# Patient Record
Sex: Male | Born: 1940
Health system: Southern US, Community
[De-identification: ages and names within clinical notes are randomized; demographics above are authoritative.]

## PROBLEM LIST (undated history)

## (undated) DIAGNOSIS — K219 Gastro-esophageal reflux disease without esophagitis: Secondary | ICD-10-CM

## (undated) DIAGNOSIS — J45909 Unspecified asthma, uncomplicated: Secondary | ICD-10-CM

## (undated) DIAGNOSIS — E785 Hyperlipidemia, unspecified: Secondary | ICD-10-CM

## (undated) DIAGNOSIS — M720 Palmar fascial fibromatosis [Dupuytren]: Secondary | ICD-10-CM

## (undated) DIAGNOSIS — K627 Radiation proctitis: Secondary | ICD-10-CM

## (undated) DIAGNOSIS — J9601 Acute respiratory failure with hypoxia: Secondary | ICD-10-CM

## (undated) DIAGNOSIS — N289 Disorder of kidney and ureter, unspecified: Secondary | ICD-10-CM

## (undated) DIAGNOSIS — M1990 Unspecified osteoarthritis, unspecified site: Secondary | ICD-10-CM

## (undated) DIAGNOSIS — I10 Essential (primary) hypertension: Secondary | ICD-10-CM

## (undated) HISTORY — DX: Hyperlipidemia, unspecified: E78.5

## (undated) HISTORY — DX: Essential (primary) hypertension: I10

## (undated) HISTORY — DX: Unspecified osteoarthritis, unspecified site: M19.90

## (undated) HISTORY — PX: PROSTATE SURGERY: SHX751

## (undated) HISTORY — DX: Palmar fascial fibromatosis (dupuytren): M72.0

## (undated) HISTORY — DX: Radiation proctitis: K62.7

## (undated) HISTORY — DX: Gastro-esophageal reflux disease without esophagitis: K21.9

---

## 2011-04-22 DIAGNOSIS — R972 Elevated prostate specific antigen [PSA]: Secondary | ICD-10-CM | POA: Diagnosis not present

## 2011-05-07 DIAGNOSIS — C61 Malignant neoplasm of prostate: Secondary | ICD-10-CM | POA: Diagnosis not present

## 2011-05-07 DIAGNOSIS — R972 Elevated prostate specific antigen [PSA]: Secondary | ICD-10-CM | POA: Diagnosis not present

## 2011-05-13 DIAGNOSIS — C61 Malignant neoplasm of prostate: Secondary | ICD-10-CM | POA: Diagnosis not present

## 2011-05-19 ENCOUNTER — Ambulatory Visit: Payer: Self-pay | Admitting: Urology

## 2011-05-19 DIAGNOSIS — R937 Abnormal findings on diagnostic imaging of other parts of musculoskeletal system: Secondary | ICD-10-CM | POA: Diagnosis not present

## 2011-05-19 DIAGNOSIS — C61 Malignant neoplasm of prostate: Secondary | ICD-10-CM | POA: Diagnosis not present

## 2011-05-28 ENCOUNTER — Ambulatory Visit: Payer: Self-pay | Admitting: Radiation Oncology

## 2011-06-10 ENCOUNTER — Ambulatory Visit: Payer: Self-pay | Admitting: Radiation Oncology

## 2011-06-10 DIAGNOSIS — Z79899 Other long term (current) drug therapy: Secondary | ICD-10-CM | POA: Diagnosis not present

## 2011-06-10 DIAGNOSIS — Z79818 Long term (current) use of other agents affecting estrogen receptors and estrogen levels: Secondary | ICD-10-CM | POA: Diagnosis not present

## 2011-06-10 DIAGNOSIS — R351 Nocturia: Secondary | ICD-10-CM | POA: Diagnosis not present

## 2011-06-10 DIAGNOSIS — Z510 Encounter for antineoplastic radiation therapy: Secondary | ICD-10-CM | POA: Diagnosis not present

## 2011-06-10 DIAGNOSIS — C61 Malignant neoplasm of prostate: Secondary | ICD-10-CM | POA: Diagnosis not present

## 2011-06-10 DIAGNOSIS — I10 Essential (primary) hypertension: Secondary | ICD-10-CM | POA: Diagnosis not present

## 2011-06-10 DIAGNOSIS — Z7982 Long term (current) use of aspirin: Secondary | ICD-10-CM | POA: Diagnosis not present

## 2011-06-10 DIAGNOSIS — N400 Enlarged prostate without lower urinary tract symptoms: Secondary | ICD-10-CM | POA: Diagnosis not present

## 2011-06-10 DIAGNOSIS — E119 Type 2 diabetes mellitus without complications: Secondary | ICD-10-CM | POA: Diagnosis not present

## 2011-06-10 DIAGNOSIS — K219 Gastro-esophageal reflux disease without esophagitis: Secondary | ICD-10-CM | POA: Diagnosis not present

## 2011-06-10 DIAGNOSIS — E785 Hyperlipidemia, unspecified: Secondary | ICD-10-CM | POA: Diagnosis not present

## 2011-06-14 DIAGNOSIS — C61 Malignant neoplasm of prostate: Secondary | ICD-10-CM | POA: Diagnosis not present

## 2011-06-16 DIAGNOSIS — C61 Malignant neoplasm of prostate: Secondary | ICD-10-CM | POA: Diagnosis not present

## 2011-06-18 ENCOUNTER — Ambulatory Visit: Payer: Self-pay | Admitting: Radiation Oncology

## 2011-06-18 DIAGNOSIS — C61 Malignant neoplasm of prostate: Secondary | ICD-10-CM | POA: Diagnosis not present

## 2011-06-18 DIAGNOSIS — Z79818 Long term (current) use of other agents affecting estrogen receptors and estrogen levels: Secondary | ICD-10-CM | POA: Diagnosis not present

## 2011-06-18 DIAGNOSIS — Z510 Encounter for antineoplastic radiation therapy: Secondary | ICD-10-CM | POA: Diagnosis not present

## 2011-06-21 DIAGNOSIS — C61 Malignant neoplasm of prostate: Secondary | ICD-10-CM | POA: Diagnosis not present

## 2011-06-22 DIAGNOSIS — C61 Malignant neoplasm of prostate: Secondary | ICD-10-CM | POA: Diagnosis not present

## 2011-06-23 DIAGNOSIS — C61 Malignant neoplasm of prostate: Secondary | ICD-10-CM | POA: Diagnosis not present

## 2011-06-24 DIAGNOSIS — C61 Malignant neoplasm of prostate: Secondary | ICD-10-CM | POA: Diagnosis not present

## 2011-06-24 LAB — CBC CANCER CENTER
Eosinophil #: 0.2 x10 3/mm (ref 0.0–0.7)
Eosinophil %: 2.6 %
HCT: 41.8 % (ref 40.0–52.0)
Lymphocyte %: 23.2 %
MCHC: 34.5 g/dL (ref 32.0–36.0)
Monocyte %: 6.5 %
Neutrophil #: 4.8 x10 3/mm (ref 1.4–6.5)
Neutrophil %: 67 %
RDW: 13.3 % (ref 11.5–14.5)
WBC: 7.1 x10 3/mm (ref 3.8–10.6)

## 2011-06-25 DIAGNOSIS — C61 Malignant neoplasm of prostate: Secondary | ICD-10-CM | POA: Diagnosis not present

## 2011-06-28 DIAGNOSIS — C61 Malignant neoplasm of prostate: Secondary | ICD-10-CM | POA: Diagnosis not present

## 2011-06-29 DIAGNOSIS — C61 Malignant neoplasm of prostate: Secondary | ICD-10-CM | POA: Diagnosis not present

## 2011-06-30 DIAGNOSIS — C61 Malignant neoplasm of prostate: Secondary | ICD-10-CM | POA: Diagnosis not present

## 2011-07-01 DIAGNOSIS — C61 Malignant neoplasm of prostate: Secondary | ICD-10-CM | POA: Diagnosis not present

## 2011-07-01 LAB — CBC CANCER CENTER
Basophil #: 0 x10 3/mm (ref 0.0–0.1)
Eosinophil %: 2 %
Lymphocyte #: 0.9 x10 3/mm — ABNORMAL LOW (ref 1.0–3.6)
Lymphocyte %: 15.7 %
MCHC: 34.5 g/dL (ref 32.0–36.0)
MCV: 91 fL (ref 80–100)
Monocyte #: 0.4 x10 3/mm (ref 0.0–0.7)
Monocyte %: 6.5 %
Neutrophil %: 75 %
Platelet: 209 x10 3/mm (ref 150–440)
RDW: 13.1 % (ref 11.5–14.5)
WBC: 6 x10 3/mm (ref 3.8–10.6)

## 2011-07-12 DIAGNOSIS — C61 Malignant neoplasm of prostate: Secondary | ICD-10-CM | POA: Diagnosis not present

## 2011-07-13 DIAGNOSIS — C61 Malignant neoplasm of prostate: Secondary | ICD-10-CM | POA: Diagnosis not present

## 2011-07-14 DIAGNOSIS — C61 Malignant neoplasm of prostate: Secondary | ICD-10-CM | POA: Diagnosis not present

## 2011-07-15 DIAGNOSIS — C61 Malignant neoplasm of prostate: Secondary | ICD-10-CM | POA: Diagnosis not present

## 2011-07-15 LAB — CBC CANCER CENTER
Basophil %: 0.5 %
Eosinophil #: 0.3 x10 3/mm (ref 0.0–0.7)
Eosinophil %: 4.5 %
HCT: 37.2 % — ABNORMAL LOW (ref 40.0–52.0)
HGB: 12.7 g/dL — ABNORMAL LOW (ref 13.0–18.0)
MCHC: 34.3 g/dL (ref 32.0–36.0)
MCV: 91 fL (ref 80–100)
Platelet: 245 x10 3/mm (ref 150–440)
RDW: 12.9 % (ref 11.5–14.5)
WBC: 5.9 x10 3/mm (ref 3.8–10.6)

## 2011-07-19 ENCOUNTER — Ambulatory Visit: Payer: Self-pay | Admitting: Radiation Oncology

## 2011-07-19 DIAGNOSIS — C61 Malignant neoplasm of prostate: Secondary | ICD-10-CM | POA: Diagnosis not present

## 2011-07-19 DIAGNOSIS — Z510 Encounter for antineoplastic radiation therapy: Secondary | ICD-10-CM | POA: Diagnosis not present

## 2011-07-19 DIAGNOSIS — Z79818 Long term (current) use of other agents affecting estrogen receptors and estrogen levels: Secondary | ICD-10-CM | POA: Diagnosis not present

## 2011-07-20 DIAGNOSIS — C61 Malignant neoplasm of prostate: Secondary | ICD-10-CM | POA: Diagnosis not present

## 2011-07-20 DIAGNOSIS — E119 Type 2 diabetes mellitus without complications: Secondary | ICD-10-CM | POA: Diagnosis not present

## 2011-07-20 DIAGNOSIS — I10 Essential (primary) hypertension: Secondary | ICD-10-CM | POA: Diagnosis not present

## 2011-07-20 DIAGNOSIS — Z0000 Encounter for general adult medical examination without abnormal findings: Secondary | ICD-10-CM | POA: Diagnosis not present

## 2011-07-20 DIAGNOSIS — E785 Hyperlipidemia, unspecified: Secondary | ICD-10-CM | POA: Diagnosis not present

## 2011-07-20 DIAGNOSIS — E7800 Pure hypercholesterolemia, unspecified: Secondary | ICD-10-CM | POA: Diagnosis not present

## 2011-07-20 DIAGNOSIS — Z23 Encounter for immunization: Secondary | ICD-10-CM | POA: Diagnosis not present

## 2011-07-20 DIAGNOSIS — N400 Enlarged prostate without lower urinary tract symptoms: Secondary | ICD-10-CM | POA: Diagnosis not present

## 2011-07-21 DIAGNOSIS — C61 Malignant neoplasm of prostate: Secondary | ICD-10-CM | POA: Diagnosis not present

## 2011-07-22 DIAGNOSIS — C61 Malignant neoplasm of prostate: Secondary | ICD-10-CM | POA: Diagnosis not present

## 2011-07-22 LAB — CBC CANCER CENTER
Basophil %: 0.6 %
Eosinophil #: 0.2 x10 3/mm (ref 0.0–0.7)
Eosinophil %: 5.3 %
HCT: 34.8 % — ABNORMAL LOW (ref 40.0–52.0)
HGB: 12 g/dL — ABNORMAL LOW (ref 13.0–18.0)
Lymphocyte %: 8.2 %
MCV: 91 fL (ref 80–100)
Monocyte %: 9.9 %
Neutrophil #: 3.3 x10 3/mm (ref 1.4–6.5)
Neutrophil %: 76 %
Platelet: 220 x10 3/mm (ref 150–440)
WBC: 4.3 x10 3/mm (ref 3.8–10.6)

## 2011-07-23 DIAGNOSIS — C61 Malignant neoplasm of prostate: Secondary | ICD-10-CM | POA: Diagnosis not present

## 2011-07-26 DIAGNOSIS — C61 Malignant neoplasm of prostate: Secondary | ICD-10-CM | POA: Diagnosis not present

## 2011-07-27 DIAGNOSIS — C61 Malignant neoplasm of prostate: Secondary | ICD-10-CM | POA: Diagnosis not present

## 2011-07-28 DIAGNOSIS — C61 Malignant neoplasm of prostate: Secondary | ICD-10-CM | POA: Diagnosis not present

## 2011-07-29 DIAGNOSIS — C61 Malignant neoplasm of prostate: Secondary | ICD-10-CM | POA: Diagnosis not present

## 2011-07-29 LAB — CBC CANCER CENTER
Basophil #: 0 x10 3/mm (ref 0.0–0.1)
Eosinophil #: 0.2 x10 3/mm (ref 0.0–0.7)
Lymphocyte #: 0.3 x10 3/mm — ABNORMAL LOW (ref 1.0–3.6)
Lymphocyte %: 10.4 %
MCHC: 33.5 g/dL (ref 32.0–36.0)
MCV: 92 fL (ref 80–100)
Monocyte #: 0.3 x10 3/mm (ref 0.2–1.0)
Neutrophil %: 73.4 %
RDW: 13.5 % (ref 11.5–14.5)
WBC: 3.3 x10 3/mm — ABNORMAL LOW (ref 3.8–10.6)

## 2011-07-30 DIAGNOSIS — C61 Malignant neoplasm of prostate: Secondary | ICD-10-CM | POA: Diagnosis not present

## 2011-08-02 DIAGNOSIS — C61 Malignant neoplasm of prostate: Secondary | ICD-10-CM | POA: Diagnosis not present

## 2011-08-03 DIAGNOSIS — C61 Malignant neoplasm of prostate: Secondary | ICD-10-CM | POA: Diagnosis not present

## 2011-08-04 DIAGNOSIS — C61 Malignant neoplasm of prostate: Secondary | ICD-10-CM | POA: Diagnosis not present

## 2011-08-06 DIAGNOSIS — C61 Malignant neoplasm of prostate: Secondary | ICD-10-CM | POA: Diagnosis not present

## 2011-08-18 ENCOUNTER — Ambulatory Visit: Payer: Self-pay | Admitting: Radiation Oncology

## 2011-08-25 ENCOUNTER — Ambulatory Visit: Payer: Self-pay | Admitting: Radiation Oncology

## 2011-08-25 DIAGNOSIS — Z79818 Long term (current) use of other agents affecting estrogen receptors and estrogen levels: Secondary | ICD-10-CM | POA: Diagnosis not present

## 2011-08-25 DIAGNOSIS — C61 Malignant neoplasm of prostate: Secondary | ICD-10-CM | POA: Diagnosis not present

## 2011-08-25 DIAGNOSIS — Z01810 Encounter for preprocedural cardiovascular examination: Secondary | ICD-10-CM | POA: Diagnosis not present

## 2011-08-25 DIAGNOSIS — Z510 Encounter for antineoplastic radiation therapy: Secondary | ICD-10-CM | POA: Diagnosis not present

## 2011-08-25 DIAGNOSIS — I10 Essential (primary) hypertension: Secondary | ICD-10-CM | POA: Diagnosis not present

## 2011-08-25 DIAGNOSIS — Z01812 Encounter for preprocedural laboratory examination: Secondary | ICD-10-CM | POA: Diagnosis not present

## 2011-08-25 LAB — BASIC METABOLIC PANEL
Anion Gap: 8 (ref 7–16)
BUN: 18 mg/dL (ref 7–18)
Chloride: 107 mmol/L (ref 98–107)
Creatinine: 1.05 mg/dL (ref 0.60–1.30)
EGFR (African American): >60
Glucose: 124 mg/dL — ABNORMAL HIGH (ref 65–99)
Potassium: 4 mmol/L (ref 3.5–5.1)
Sodium: 140 mmol/L (ref 136–145)

## 2011-08-25 LAB — HEMOGLOBIN: HGB: 11.5 g/dL — ABNORMAL LOW (ref 13.0–18.0)

## 2011-09-08 ENCOUNTER — Ambulatory Visit: Payer: Self-pay | Admitting: Urology

## 2011-09-08 DIAGNOSIS — C61 Malignant neoplasm of prostate: Secondary | ICD-10-CM | POA: Diagnosis not present

## 2011-09-08 DIAGNOSIS — K219 Gastro-esophageal reflux disease without esophagitis: Secondary | ICD-10-CM | POA: Diagnosis not present

## 2011-09-08 DIAGNOSIS — Z79899 Other long term (current) drug therapy: Secondary | ICD-10-CM | POA: Diagnosis not present

## 2011-09-08 DIAGNOSIS — I10 Essential (primary) hypertension: Secondary | ICD-10-CM | POA: Diagnosis not present

## 2011-09-08 DIAGNOSIS — Z7982 Long term (current) use of aspirin: Secondary | ICD-10-CM | POA: Diagnosis not present

## 2011-09-08 DIAGNOSIS — E119 Type 2 diabetes mellitus without complications: Secondary | ICD-10-CM | POA: Diagnosis not present

## 2011-09-08 DIAGNOSIS — E785 Hyperlipidemia, unspecified: Secondary | ICD-10-CM | POA: Diagnosis not present

## 2011-09-10 DIAGNOSIS — C61 Malignant neoplasm of prostate: Secondary | ICD-10-CM | POA: Diagnosis not present

## 2011-09-16 DIAGNOSIS — N401 Enlarged prostate with lower urinary tract symptoms: Secondary | ICD-10-CM | POA: Diagnosis not present

## 2011-09-16 DIAGNOSIS — N138 Other obstructive and reflux uropathy: Secondary | ICD-10-CM | POA: Diagnosis not present

## 2011-09-18 ENCOUNTER — Ambulatory Visit: Payer: Self-pay | Admitting: Radiation Oncology

## 2011-09-18 DIAGNOSIS — Z923 Personal history of irradiation: Secondary | ICD-10-CM | POA: Diagnosis not present

## 2011-09-18 DIAGNOSIS — C61 Malignant neoplasm of prostate: Secondary | ICD-10-CM | POA: Diagnosis not present

## 2011-09-18 DIAGNOSIS — Z79818 Long term (current) use of other agents affecting estrogen receptors and estrogen levels: Secondary | ICD-10-CM | POA: Diagnosis not present

## 2011-09-30 DIAGNOSIS — C61 Malignant neoplasm of prostate: Secondary | ICD-10-CM | POA: Diagnosis not present

## 2011-10-18 ENCOUNTER — Ambulatory Visit: Payer: Self-pay | Admitting: Radiation Oncology

## 2011-10-18 DIAGNOSIS — C61 Malignant neoplasm of prostate: Secondary | ICD-10-CM | POA: Diagnosis not present

## 2011-10-18 DIAGNOSIS — Z510 Encounter for antineoplastic radiation therapy: Secondary | ICD-10-CM | POA: Diagnosis not present

## 2011-10-18 DIAGNOSIS — Z79818 Long term (current) use of other agents affecting estrogen receptors and estrogen levels: Secondary | ICD-10-CM | POA: Diagnosis not present

## 2011-10-25 DIAGNOSIS — E785 Hyperlipidemia, unspecified: Secondary | ICD-10-CM | POA: Diagnosis not present

## 2011-10-25 DIAGNOSIS — E119 Type 2 diabetes mellitus without complications: Secondary | ICD-10-CM | POA: Diagnosis not present

## 2011-10-25 DIAGNOSIS — I10 Essential (primary) hypertension: Secondary | ICD-10-CM | POA: Diagnosis not present

## 2011-10-27 ENCOUNTER — Ambulatory Visit: Payer: Self-pay | Admitting: Radiation Oncology

## 2011-10-27 DIAGNOSIS — C61 Malignant neoplasm of prostate: Secondary | ICD-10-CM | POA: Diagnosis not present

## 2011-11-18 ENCOUNTER — Ambulatory Visit: Payer: Self-pay | Admitting: Radiation Oncology

## 2012-01-03 DIAGNOSIS — N138 Other obstructive and reflux uropathy: Secondary | ICD-10-CM | POA: Diagnosis not present

## 2012-01-03 DIAGNOSIS — N3941 Urge incontinence: Secondary | ICD-10-CM | POA: Diagnosis not present

## 2012-01-03 DIAGNOSIS — N401 Enlarged prostate with lower urinary tract symptoms: Secondary | ICD-10-CM | POA: Diagnosis not present

## 2012-01-03 DIAGNOSIS — C61 Malignant neoplasm of prostate: Secondary | ICD-10-CM | POA: Diagnosis not present

## 2012-01-25 DIAGNOSIS — E785 Hyperlipidemia, unspecified: Secondary | ICD-10-CM | POA: Diagnosis not present

## 2012-01-25 DIAGNOSIS — Z23 Encounter for immunization: Secondary | ICD-10-CM | POA: Diagnosis not present

## 2012-01-25 DIAGNOSIS — E119 Type 2 diabetes mellitus without complications: Secondary | ICD-10-CM | POA: Diagnosis not present

## 2012-01-25 DIAGNOSIS — I10 Essential (primary) hypertension: Secondary | ICD-10-CM | POA: Diagnosis not present

## 2012-02-07 ENCOUNTER — Ambulatory Visit: Payer: Self-pay | Admitting: Radiation Oncology

## 2012-02-07 DIAGNOSIS — Z923 Personal history of irradiation: Secondary | ICD-10-CM | POA: Diagnosis not present

## 2012-02-07 DIAGNOSIS — Z09 Encounter for follow-up examination after completed treatment for conditions other than malignant neoplasm: Secondary | ICD-10-CM | POA: Diagnosis not present

## 2012-02-07 DIAGNOSIS — C61 Malignant neoplasm of prostate: Secondary | ICD-10-CM | POA: Diagnosis not present

## 2012-02-16 DIAGNOSIS — H2510 Age-related nuclear cataract, unspecified eye: Secondary | ICD-10-CM | POA: Diagnosis not present

## 2012-02-16 DIAGNOSIS — E119 Type 2 diabetes mellitus without complications: Secondary | ICD-10-CM | POA: Diagnosis not present

## 2012-02-18 ENCOUNTER — Ambulatory Visit: Payer: Self-pay | Admitting: Radiation Oncology

## 2012-05-01 DIAGNOSIS — E119 Type 2 diabetes mellitus without complications: Secondary | ICD-10-CM | POA: Diagnosis not present

## 2012-05-01 DIAGNOSIS — I10 Essential (primary) hypertension: Secondary | ICD-10-CM | POA: Diagnosis not present

## 2012-05-01 DIAGNOSIS — E785 Hyperlipidemia, unspecified: Secondary | ICD-10-CM | POA: Diagnosis not present

## 2012-05-24 DIAGNOSIS — N3941 Urge incontinence: Secondary | ICD-10-CM | POA: Diagnosis not present

## 2012-05-24 DIAGNOSIS — C61 Malignant neoplasm of prostate: Secondary | ICD-10-CM | POA: Diagnosis not present

## 2012-05-24 DIAGNOSIS — R350 Frequency of micturition: Secondary | ICD-10-CM | POA: Diagnosis not present

## 2012-07-31 DIAGNOSIS — E7800 Pure hypercholesterolemia, unspecified: Secondary | ICD-10-CM | POA: Diagnosis not present

## 2012-07-31 DIAGNOSIS — E119 Type 2 diabetes mellitus without complications: Secondary | ICD-10-CM | POA: Diagnosis not present

## 2012-07-31 DIAGNOSIS — I10 Essential (primary) hypertension: Secondary | ICD-10-CM | POA: Diagnosis not present

## 2012-08-03 ENCOUNTER — Ambulatory Visit: Payer: Self-pay | Admitting: Radiation Oncology

## 2012-08-03 DIAGNOSIS — Z923 Personal history of irradiation: Secondary | ICD-10-CM | POA: Diagnosis not present

## 2012-08-03 DIAGNOSIS — K5900 Constipation, unspecified: Secondary | ICD-10-CM | POA: Diagnosis not present

## 2012-08-03 DIAGNOSIS — C61 Malignant neoplasm of prostate: Secondary | ICD-10-CM | POA: Diagnosis not present

## 2012-08-03 DIAGNOSIS — Z09 Encounter for follow-up examination after completed treatment for conditions other than malignant neoplasm: Secondary | ICD-10-CM | POA: Diagnosis not present

## 2012-08-03 DIAGNOSIS — R351 Nocturia: Secondary | ICD-10-CM | POA: Diagnosis not present

## 2012-08-07 DIAGNOSIS — R351 Nocturia: Secondary | ICD-10-CM | POA: Diagnosis not present

## 2012-08-07 DIAGNOSIS — Z09 Encounter for follow-up examination after completed treatment for conditions other than malignant neoplasm: Secondary | ICD-10-CM | POA: Diagnosis not present

## 2012-08-07 DIAGNOSIS — C61 Malignant neoplasm of prostate: Secondary | ICD-10-CM | POA: Diagnosis not present

## 2012-08-07 DIAGNOSIS — K5900 Constipation, unspecified: Secondary | ICD-10-CM | POA: Diagnosis not present

## 2012-08-07 DIAGNOSIS — Z923 Personal history of irradiation: Secondary | ICD-10-CM | POA: Diagnosis not present

## 2012-08-17 ENCOUNTER — Ambulatory Visit: Payer: Self-pay | Admitting: Radiation Oncology

## 2012-10-12 DIAGNOSIS — Z0000 Encounter for general adult medical examination without abnormal findings: Secondary | ICD-10-CM | POA: Diagnosis not present

## 2012-10-12 DIAGNOSIS — M720 Palmar fascial fibromatosis [Dupuytren]: Secondary | ICD-10-CM | POA: Diagnosis not present

## 2012-10-12 DIAGNOSIS — I10 Essential (primary) hypertension: Secondary | ICD-10-CM | POA: Diagnosis not present

## 2012-10-12 DIAGNOSIS — E785 Hyperlipidemia, unspecified: Secondary | ICD-10-CM | POA: Diagnosis not present

## 2012-10-12 DIAGNOSIS — E119 Type 2 diabetes mellitus without complications: Secondary | ICD-10-CM | POA: Diagnosis not present

## 2012-10-12 DIAGNOSIS — Z125 Encounter for screening for malignant neoplasm of prostate: Secondary | ICD-10-CM | POA: Diagnosis not present

## 2012-10-12 DIAGNOSIS — D4959 Neoplasm of unspecified behavior of other genitourinary organ: Secondary | ICD-10-CM | POA: Diagnosis not present

## 2012-10-27 DIAGNOSIS — R7989 Other specified abnormal findings of blood chemistry: Secondary | ICD-10-CM | POA: Diagnosis not present

## 2012-11-29 DIAGNOSIS — D509 Iron deficiency anemia, unspecified: Secondary | ICD-10-CM | POA: Diagnosis not present

## 2013-01-08 DIAGNOSIS — Z23 Encounter for immunization: Secondary | ICD-10-CM | POA: Diagnosis not present

## 2013-01-08 DIAGNOSIS — I10 Essential (primary) hypertension: Secondary | ICD-10-CM | POA: Diagnosis not present

## 2013-01-08 DIAGNOSIS — E119 Type 2 diabetes mellitus without complications: Secondary | ICD-10-CM | POA: Diagnosis not present

## 2013-01-08 DIAGNOSIS — D539 Nutritional anemia, unspecified: Secondary | ICD-10-CM | POA: Diagnosis not present

## 2013-01-08 DIAGNOSIS — E7800 Pure hypercholesterolemia, unspecified: Secondary | ICD-10-CM | POA: Diagnosis not present

## 2013-01-11 DIAGNOSIS — Z1211 Encounter for screening for malignant neoplasm of colon: Secondary | ICD-10-CM | POA: Diagnosis not present

## 2013-01-11 DIAGNOSIS — R319 Hematuria, unspecified: Secondary | ICD-10-CM | POA: Diagnosis not present

## 2013-01-11 DIAGNOSIS — K219 Gastro-esophageal reflux disease without esophagitis: Secondary | ICD-10-CM | POA: Diagnosis not present

## 2013-01-11 DIAGNOSIS — E119 Type 2 diabetes mellitus without complications: Secondary | ICD-10-CM | POA: Diagnosis not present

## 2013-01-22 ENCOUNTER — Ambulatory Visit: Payer: Self-pay | Admitting: Gastroenterology

## 2013-01-22 DIAGNOSIS — Z1211 Encounter for screening for malignant neoplasm of colon: Secondary | ICD-10-CM | POA: Diagnosis not present

## 2013-01-22 DIAGNOSIS — K648 Other hemorrhoids: Secondary | ICD-10-CM | POA: Diagnosis not present

## 2013-01-22 DIAGNOSIS — K2970 Gastritis, unspecified, without bleeding: Secondary | ICD-10-CM | POA: Diagnosis not present

## 2013-01-22 DIAGNOSIS — E119 Type 2 diabetes mellitus without complications: Secondary | ICD-10-CM | POA: Diagnosis not present

## 2013-01-22 DIAGNOSIS — K219 Gastro-esophageal reflux disease without esophagitis: Secondary | ICD-10-CM | POA: Diagnosis not present

## 2013-01-22 DIAGNOSIS — K2940 Chronic atrophic gastritis without bleeding: Secondary | ICD-10-CM | POA: Diagnosis not present

## 2013-01-22 DIAGNOSIS — I10 Essential (primary) hypertension: Secondary | ICD-10-CM | POA: Diagnosis not present

## 2013-01-22 DIAGNOSIS — K319 Disease of stomach and duodenum, unspecified: Secondary | ICD-10-CM | POA: Diagnosis not present

## 2013-01-22 DIAGNOSIS — K2100 Gastro-esophageal reflux disease with esophagitis, without bleeding: Secondary | ICD-10-CM | POA: Diagnosis not present

## 2013-01-22 DIAGNOSIS — Z79899 Other long term (current) drug therapy: Secondary | ICD-10-CM | POA: Diagnosis not present

## 2013-01-22 DIAGNOSIS — K5730 Diverticulosis of large intestine without perforation or abscess without bleeding: Secondary | ICD-10-CM | POA: Diagnosis not present

## 2013-01-22 DIAGNOSIS — D126 Benign neoplasm of colon, unspecified: Secondary | ICD-10-CM | POA: Diagnosis not present

## 2013-01-22 DIAGNOSIS — D131 Benign neoplasm of stomach: Secondary | ICD-10-CM | POA: Diagnosis not present

## 2013-01-22 DIAGNOSIS — N400 Enlarged prostate without lower urinary tract symptoms: Secondary | ICD-10-CM | POA: Diagnosis not present

## 2013-01-22 DIAGNOSIS — K5520 Angiodysplasia of colon without hemorrhage: Secondary | ICD-10-CM | POA: Diagnosis not present

## 2013-01-22 DIAGNOSIS — Z7982 Long term (current) use of aspirin: Secondary | ICD-10-CM | POA: Diagnosis not present

## 2013-01-22 DIAGNOSIS — K2980 Duodenitis without bleeding: Secondary | ICD-10-CM | POA: Diagnosis not present

## 2013-01-23 IMAGING — CT CT SIM MISC
1 series · 16 of 32 positions shown, 20 images · non-contrast
Comparison: none

[Series 2: — · axial · 0.49mm/px · z∈[-1036,-882]mm · 16 of 86 slices shown, 20 images]
[im 6/86  soft-tissue]
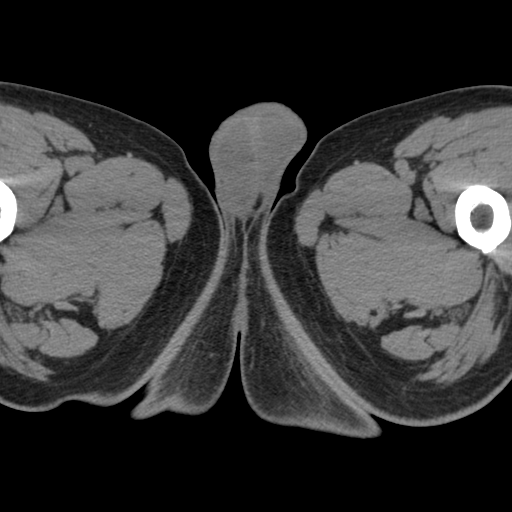
[im 6/86  bone]
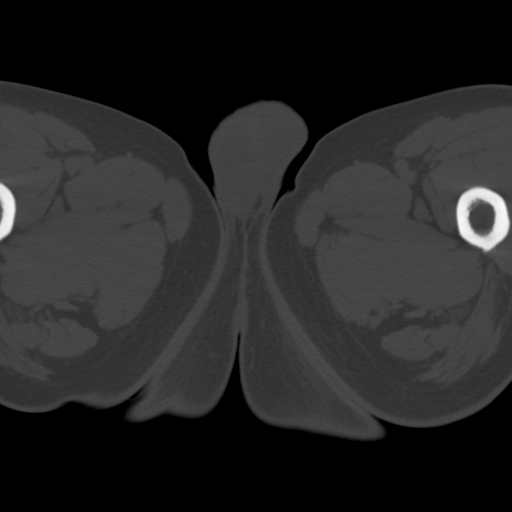
[im 11/86  soft-tissue]
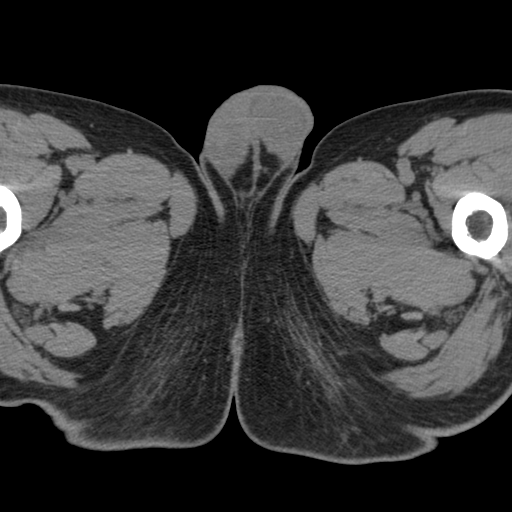
[im 17/86  soft-tissue]
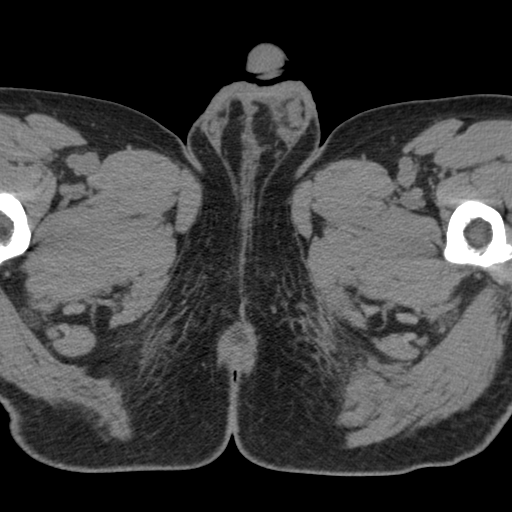
[im 22/86  soft-tissue]
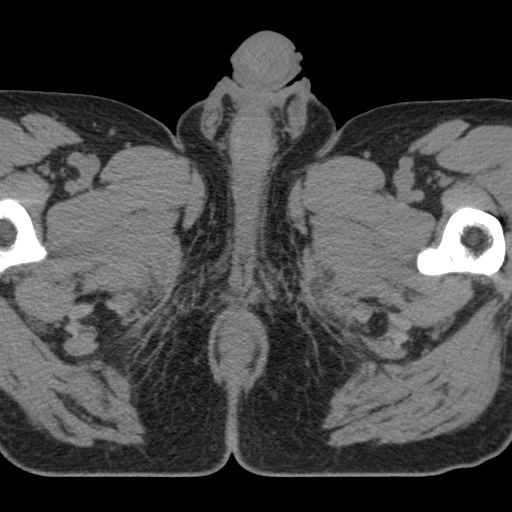
[im 28/86  soft-tissue]
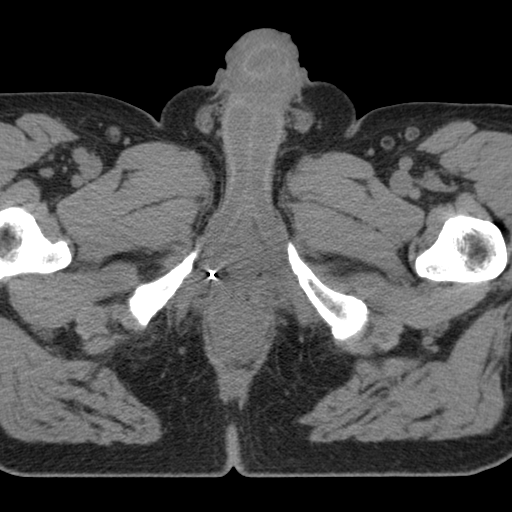
[im 33/86  soft-tissue]
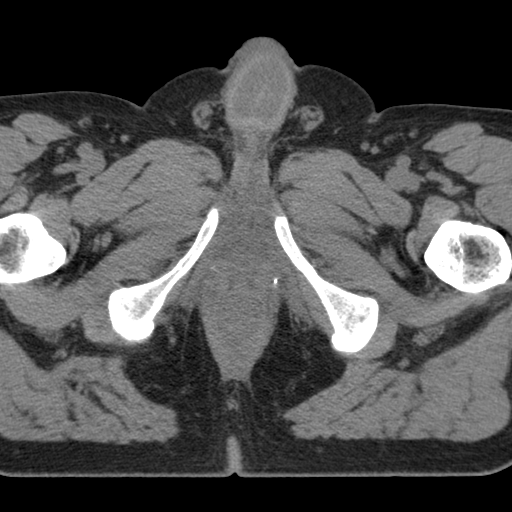
[im 39/86  soft-tissue]
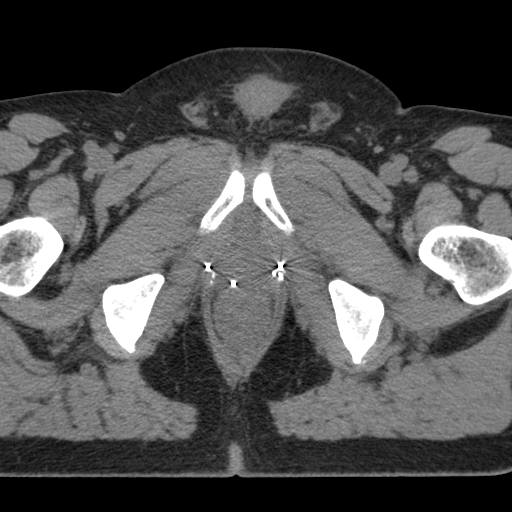
[im 47/86  soft-tissue]
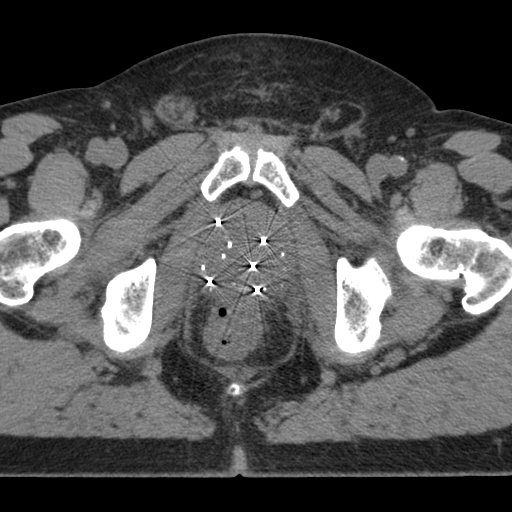
[im 53/86  soft-tissue]
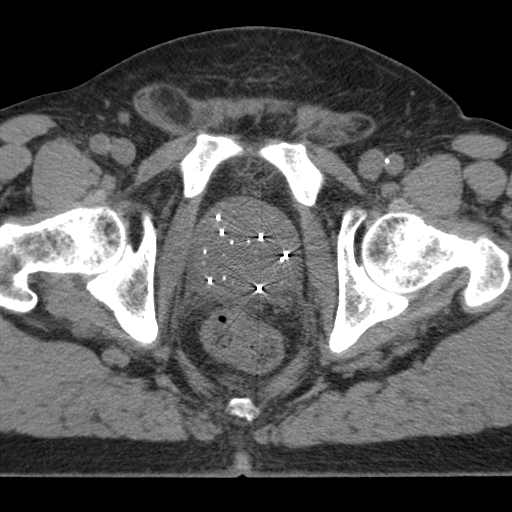
[im 53/86  bone]
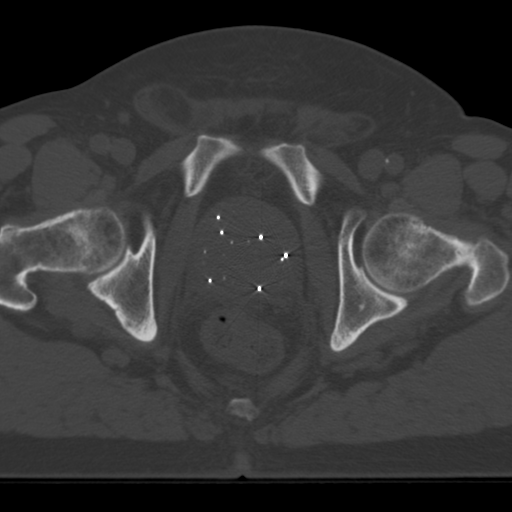
[im 58/86  soft-tissue]
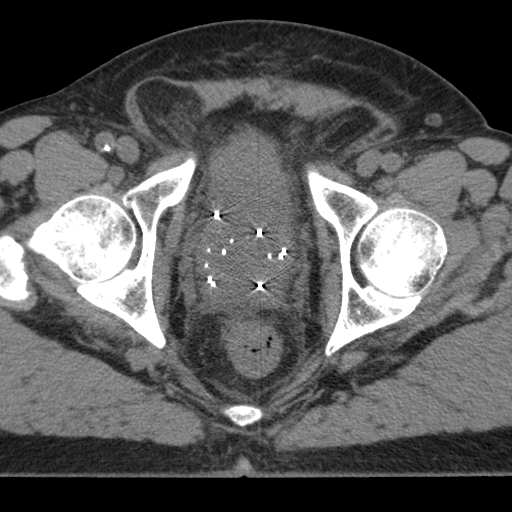
[im 64/86  soft-tissue]
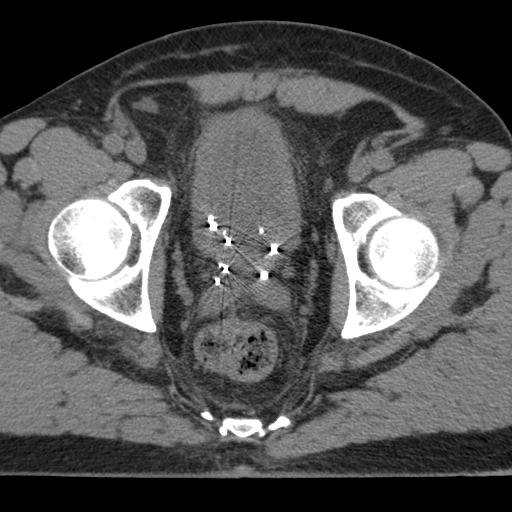
[im 69/86  soft-tissue]
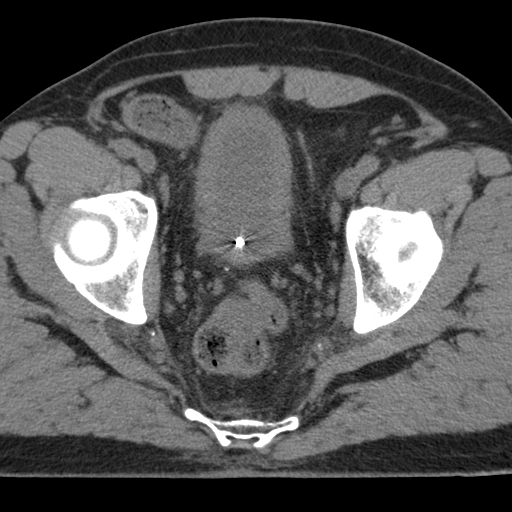
[im 75/86  soft-tissue]
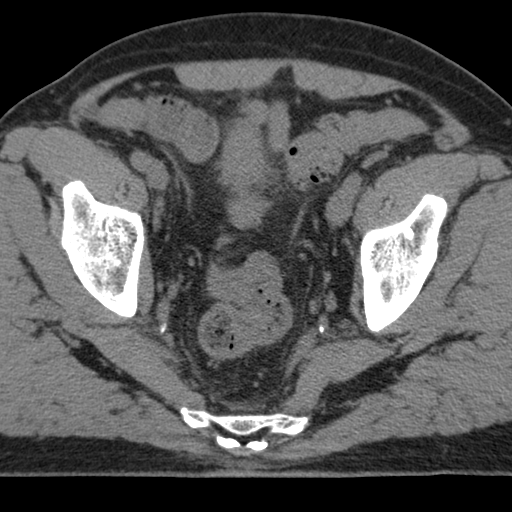
[im 75/86  lung]
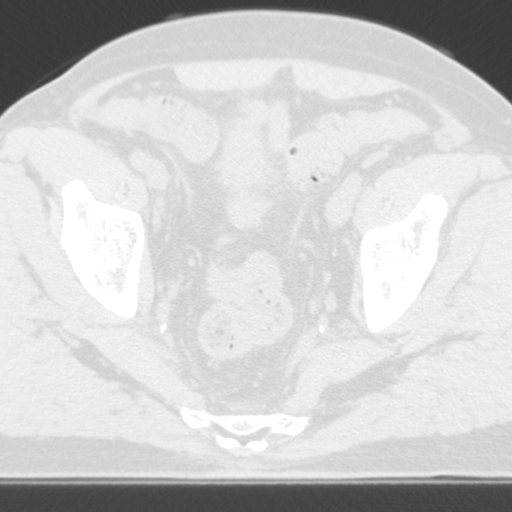
[im 77/86  lung]
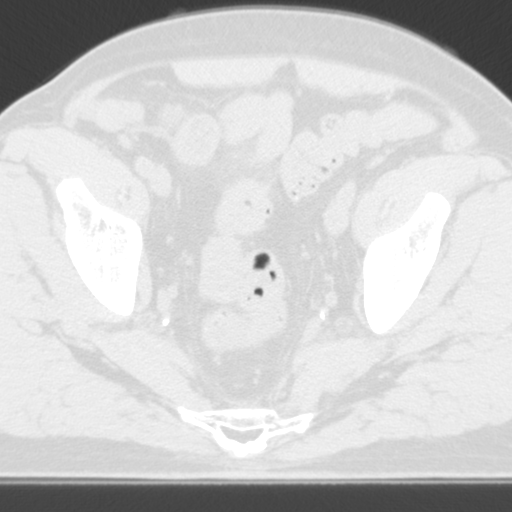
[im 80/86  soft-tissue]
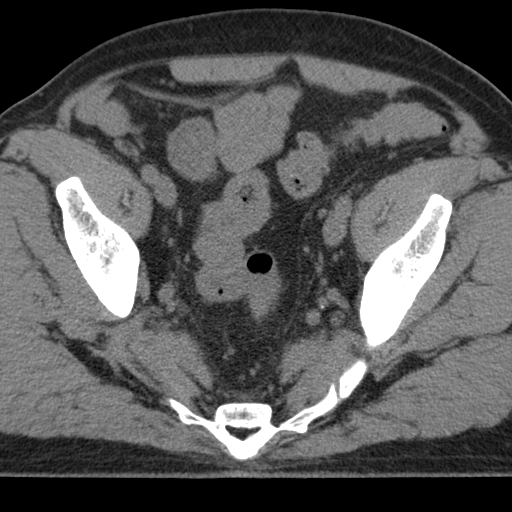
[im 80/86  lung]
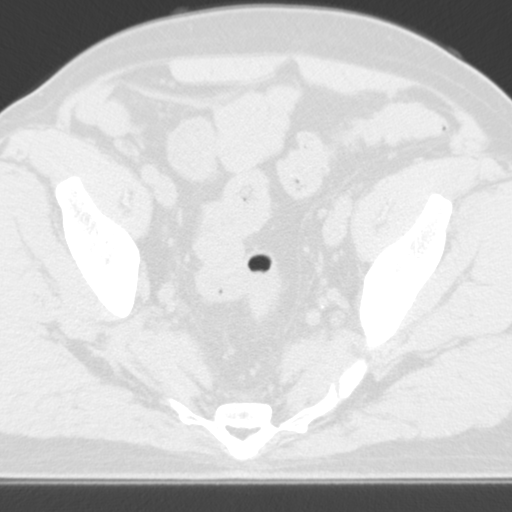
[im 83/86  lung]
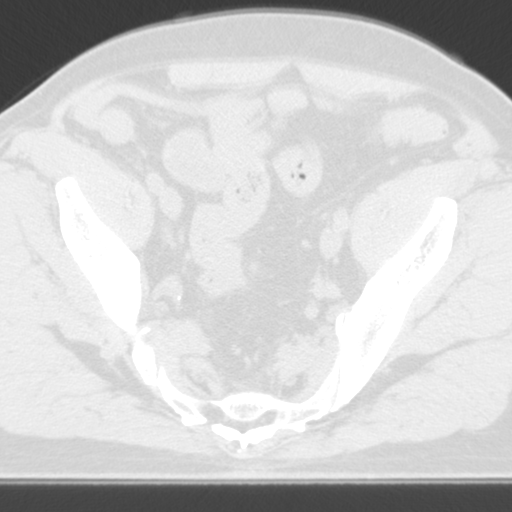

[16 of 32 positions shown; findings below may reference images not displayed]

IMAGES IMPORTED FROM THE [employer] WORKFLOW SYSTEM
NO DICTATION FOR STUDY

## 2013-01-30 LAB — PATHOLOGY REPORT

## 2013-02-02 DIAGNOSIS — N401 Enlarged prostate with lower urinary tract symptoms: Secondary | ICD-10-CM | POA: Insufficient documentation

## 2013-02-02 DIAGNOSIS — Z8546 Personal history of malignant neoplasm of prostate: Secondary | ICD-10-CM | POA: Insufficient documentation

## 2013-02-02 DIAGNOSIS — E1159 Type 2 diabetes mellitus with other circulatory complications: Secondary | ICD-10-CM | POA: Insufficient documentation

## 2013-02-02 DIAGNOSIS — C61 Malignant neoplasm of prostate: Secondary | ICD-10-CM | POA: Insufficient documentation

## 2013-02-02 DIAGNOSIS — N138 Other obstructive and reflux uropathy: Secondary | ICD-10-CM | POA: Insufficient documentation

## 2013-02-02 DIAGNOSIS — N3941 Urge incontinence: Secondary | ICD-10-CM | POA: Insufficient documentation

## 2013-02-02 DIAGNOSIS — E1165 Type 2 diabetes mellitus with hyperglycemia: Secondary | ICD-10-CM | POA: Insufficient documentation

## 2013-02-05 DIAGNOSIS — N401 Enlarged prostate with lower urinary tract symptoms: Secondary | ICD-10-CM | POA: Diagnosis not present

## 2013-02-05 DIAGNOSIS — R350 Frequency of micturition: Secondary | ICD-10-CM | POA: Diagnosis not present

## 2013-02-05 DIAGNOSIS — N3941 Urge incontinence: Secondary | ICD-10-CM | POA: Diagnosis not present

## 2013-02-05 DIAGNOSIS — N138 Other obstructive and reflux uropathy: Secondary | ICD-10-CM | POA: Diagnosis not present

## 2013-02-05 DIAGNOSIS — C61 Malignant neoplasm of prostate: Secondary | ICD-10-CM | POA: Diagnosis not present

## 2013-03-06 DIAGNOSIS — D126 Benign neoplasm of colon, unspecified: Secondary | ICD-10-CM | POA: Diagnosis not present

## 2013-03-06 DIAGNOSIS — K2100 Gastro-esophageal reflux disease with esophagitis, without bleeding: Secondary | ICD-10-CM | POA: Diagnosis not present

## 2013-05-08 DIAGNOSIS — D509 Iron deficiency anemia, unspecified: Secondary | ICD-10-CM | POA: Diagnosis not present

## 2013-05-08 DIAGNOSIS — E785 Hyperlipidemia, unspecified: Secondary | ICD-10-CM | POA: Diagnosis not present

## 2013-05-08 DIAGNOSIS — E119 Type 2 diabetes mellitus without complications: Secondary | ICD-10-CM | POA: Diagnosis not present

## 2013-05-08 DIAGNOSIS — I10 Essential (primary) hypertension: Secondary | ICD-10-CM | POA: Diagnosis not present

## 2013-05-25 DIAGNOSIS — H2510 Age-related nuclear cataract, unspecified eye: Secondary | ICD-10-CM | POA: Diagnosis not present

## 2013-08-06 DIAGNOSIS — E7800 Pure hypercholesterolemia, unspecified: Secondary | ICD-10-CM | POA: Diagnosis not present

## 2013-08-06 DIAGNOSIS — E119 Type 2 diabetes mellitus without complications: Secondary | ICD-10-CM | POA: Diagnosis not present

## 2013-08-06 DIAGNOSIS — I10 Essential (primary) hypertension: Secondary | ICD-10-CM | POA: Diagnosis not present

## 2013-08-07 ENCOUNTER — Ambulatory Visit: Payer: Self-pay | Admitting: Radiation Oncology

## 2013-10-18 DIAGNOSIS — E7800 Pure hypercholesterolemia, unspecified: Secondary | ICD-10-CM | POA: Diagnosis not present

## 2013-10-18 DIAGNOSIS — Z0000 Encounter for general adult medical examination without abnormal findings: Secondary | ICD-10-CM | POA: Diagnosis not present

## 2013-10-18 DIAGNOSIS — D4959 Neoplasm of unspecified behavior of other genitourinary organ: Secondary | ICD-10-CM | POA: Diagnosis not present

## 2013-10-18 DIAGNOSIS — E119 Type 2 diabetes mellitus without complications: Secondary | ICD-10-CM | POA: Diagnosis not present

## 2013-10-18 DIAGNOSIS — Z23 Encounter for immunization: Secondary | ICD-10-CM | POA: Diagnosis not present

## 2013-10-18 DIAGNOSIS — E785 Hyperlipidemia, unspecified: Secondary | ICD-10-CM | POA: Diagnosis not present

## 2013-10-18 DIAGNOSIS — I10 Essential (primary) hypertension: Secondary | ICD-10-CM | POA: Diagnosis not present

## 2014-01-24 DIAGNOSIS — K627 Radiation proctitis: Secondary | ICD-10-CM | POA: Diagnosis not present

## 2014-01-24 DIAGNOSIS — E785 Hyperlipidemia, unspecified: Secondary | ICD-10-CM | POA: Diagnosis not present

## 2014-01-24 DIAGNOSIS — I10 Essential (primary) hypertension: Secondary | ICD-10-CM | POA: Diagnosis not present

## 2014-01-24 DIAGNOSIS — C61 Malignant neoplasm of prostate: Secondary | ICD-10-CM | POA: Diagnosis not present

## 2014-01-24 DIAGNOSIS — E119 Type 2 diabetes mellitus without complications: Secondary | ICD-10-CM | POA: Diagnosis not present

## 2014-01-24 DIAGNOSIS — Z23 Encounter for immunization: Secondary | ICD-10-CM | POA: Diagnosis not present

## 2014-04-29 DIAGNOSIS — I10 Essential (primary) hypertension: Secondary | ICD-10-CM | POA: Diagnosis not present

## 2014-04-29 DIAGNOSIS — E785 Hyperlipidemia, unspecified: Secondary | ICD-10-CM | POA: Diagnosis not present

## 2014-04-29 DIAGNOSIS — E119 Type 2 diabetes mellitus without complications: Secondary | ICD-10-CM | POA: Diagnosis not present

## 2014-05-27 DIAGNOSIS — H2513 Age-related nuclear cataract, bilateral: Secondary | ICD-10-CM | POA: Diagnosis not present

## 2014-07-29 DIAGNOSIS — E785 Hyperlipidemia, unspecified: Secondary | ICD-10-CM | POA: Diagnosis not present

## 2014-07-29 DIAGNOSIS — I10 Essential (primary) hypertension: Secondary | ICD-10-CM | POA: Diagnosis not present

## 2014-07-29 DIAGNOSIS — E119 Type 2 diabetes mellitus without complications: Secondary | ICD-10-CM | POA: Diagnosis not present

## 2014-08-11 NOTE — Op Note (Signed)
PATIENT NAME:  Duane Hill, Duane Hill MR#:  [id number] DATE OF BIRTH:  20-Aug-1940  DATE OF PROCEDURE:  09/08/2011  PREOPERATIVE DIAGNOSIS: Clinical stage T1C adenocarcinoma of the prostate.   POSTOPERATIVE DIAGNOSIS: Clinical stage T1C adenocarcinoma of the prostate.   PROCEDURE:  1. I-125 prostate interstitial implant.  2. Cystoscopy.   SURGEON: John Giovanni, M.D.   RADIATION ONCOLOGIST: Noreene Filbert, M.D.   ANESTHESIA: General.   INDICATIONS: 74 year old white male who underwent biopsy 04/2011 for a PSA of 10.4 with biopsy positive for Gleason 4 + 4 adenocarcinoma of the prostate. He has received IMRT at 5000 cGy and is scheduled for I-125 interstitial implant to boost the  prostate another 100 Gy. He recently underwent a volume study for brachytherapy planning.   DESCRIPTION OF PROCEDURE: The patient was taken to the operating room where a general anesthetic was administered. He was placed in the lithotomy position with positioning to match that obtained during the volume study. External genitalia and perineum were prepped and draped in the standard fashion. Time-out was called. An 54 French Foley catheter was placed without difficulty. ASIS 7.5 MHz transrectal ultrasound probe was placed per rectum and inserted into the stepping unit. The prostate image was positioned to match that obtained during the volume study. Preloaded needles were then passed to coordinates as determined by the volume study. These were passed under ultrasound guidance with position verified in both transverse and sagittal views. 23 needles were placed with a total number of 115 seeds. At the completion of the  procedure the Foley catheter was removed. A 21 French cystoscope was passed under direct vision. The urethra was normal in caliber without stricture. The prostate was remarkable for touching lateral lobes. No visible seeds were seen within the urethra, prostatic urethra, or bladder. The cystoscope was removed. Foley  catheter was replaced. Postoperative x-ray was taken and all seeds were accounted for.  The patient was taken to the postanesthesia care unit in stable condition. There were no complications. Estimated blood loss was minimal.   ____________________________ Ronda Fairly. Bernardo Heater, MD scs:bjt D: 09/08/2011 08:45:07 ET T: 09/08/2011 10:40:24 ET JOB#: [id number]  cc: Nicki Reaper C. Bernardo Heater, MD, <Dictator> Abbie Sons MD ELECTRONICALLY SIGNED 09/16/2011 14:20

## 2014-08-11 NOTE — Consult Note (Signed)
Reason for Visit: This 74 year old Male patient presents to the clinic for initial evaluation of .   Referred by Dr. Bernardo Heater.  Diagnosis:   Chief Complaint/Diagnosis   74 year old male with clinical stage IIb (T1 C. N0 M0) with Gleason score of 8 and PSA approximately 11   Pathology Report Pathology report reviewed    Imaging Report Bone scan reviewed    Referral Report Clinical notes reviewed    Planned Treatment Regimen IMRT radiation to prostate and pelvic nodes plus I-125 interstitial implant for boost to prostate plus Lupron therapy    HPI   patient is a 74 year old male whose been followed for a slight rising PSA over the past several years. His most recent PSA was 10.4 on 05/07/11. He does have some urinary frequency and nocturia x4-5 has been on Flomax in the past although has discontinued that. He underwent a transrectal ultrasound guided biopsy by Dr. Bernardo Heater showing by lobar adenocarcinoma highest Gleason score was a Gleason 8 (4+4). He also had Gleason 6 (3+3) as well as a Gleason 7 (4+3). Treatment options were discussed with Dr. Bernardo Heater he is now referred to radiation collagen for opinion. He did have a bone scan which is essentially unremarkable showing no evidence of metastatic disease. He is having no bowel issues at this time. He specifically denies any areas of bone pain.  Past Hx:    Diabetes:    Acid Reflux:    Hyperlipidemia:    Hyperlip:    Hypertension:    Prostate Cancer:   Past, Family and Social History:   Past Medical History positive    Cardiovascular hypertension    Genitourinary BPH    Endocrine diabetes mellitus    Family History positive    Family History Comments Family history positive for hypertension and adult onset diabetes, BPH, and cerebrovascular disease.    Social History positive    Social History Comments 40-pack-year smoking history, social EtOH use history    Additional Past Medical and Surgical History Seen by himself  today   Allergies:   No Known Allergies:   Home Meds:  Home Medications: Medication Instructions Status  Triplex 135 mg.  1 tab(s) orally once a day Active  simvastatin 40 mg oral tablet 1 tab(s) orally once a day (at bedtime) Active  quinapril 20 mg oral tablet 1 tab(s) orally once a day Active  hydrochlorothiazide-quinapril 12.5 mg-20 mg oral tablet 1 tab(s) orally once a day Active  Prilosec 20 mg oral delayed release capsule 1 cap(s) orally once a day Active  metformin 500 mg oral tablet 1 tab(s) orally once a day in the morning.  Active  metformin 500 mg oral tablet 2 tab(s) orally once a day (at bedtime) Active  Coreg 25 mg oral tablet 1 tab(s) orally 2 times a day Active  Astepro 205.5 mcg/inh (0.15%) nasal spray 1 spray(s) nasal 2 times a day, As Neededfor nasal congestion/breathing. Active  albuterol-ipratropium 90 mcg. 2 puff(s) inhaled every 4 hours, As Needed- for Shortness of Breath (uses mainly in the summer after moving). Active  aspirin 81 mg oral tablet 1 tab(s) orally once a day Active  Omega 3 1000 mg.  2 cap(s) orally 2 times a day Active   Review of Systems:   Performance Status (ECOG) 0    Skin negative    Breast negative    Ophthalmologic negative    ENMT negative    Respiratory and Thorax negative    Cardiovascular see HPI  Gastrointestinal negative    Genitourinary see HPI    Musculoskeletal negative    Neurological negative    Psychiatric negative    Hematology/Lymphatics negative    Endocrine see HPI    Allergic/Immunologic negative   Nursing Notes:  Nursing Vital Signs and Chemo Nursing Nursing Notes: *CC Vital Signs Flowsheet:   21-Feb-13 14:41   Vital Signs Type Vital Signs Type Routine   Pulse Pulse 54   Respirations Respirations 20   SBP SBP 128   DBP DBP 81   Physical Exam:  General/Skin/HEENT:   General normal    Skin normal    Eyes normal    ENMT normal    Head and Neck normal    Additional PE  Well-developed well-nourished male in NAD. Lungs are clear to A&P cardiac examination shows regular rate and rhythm without murmur rub or thrill. Abdomen is benign. On rectal exam rectal sphincter tone is good. Prostate is enlarged and firm. Sulcus is somewhat obliterated on the left side. No other rectal abnormalities are identified.   Breasts/Resp/CV/GI/GU:   Respiratory and Thorax normal    Cardiovascular normal    Gastrointestinal normal    Genitourinary normal   MS/Neuro/Psych/Lymph:   Musculoskeletal normal    Neurological normal    Lymphatics normal   Other Results:  Radiology Results: Nuclear Med:    30-Jan-13 13:54, Bone Scan Whole Body (Part 2 of 2)   Bone Scan Whole Body (Part 2 of 2)    REASON FOR EXAM:    prostate cancer newly diagnosed  COMMENTS:       PROCEDURE: NM  - NM BONE WB 3 HR 2 OF 2  - May 19 2011  1:54PM     RESULT:     History: Prostate cancer.    Comparison Study: No prior.    Findings: Following administration 21.9 mCi of technetium 6m MDP, bone   scan is obtained. Bilateral renal function is noted. Scoliosis of the   thoracolumbar spine is noted. Mild increased activity is noted throughout   the thoracolumbar spine. This is most likely degenerative. Plain film     examination or MRI can be obtained. Mild increased activity is noted   about the distal right clavicle and about the medial aspect of the left   shoulder. Plain films of the right and left shoulder is suggested. These   findings are most likely degenerative. Activity noted about the right   ankle and great toe are most likely degenerative. Activity noted about   the midfoot laterally on the left may be related to stress fracture and   degenerative change. Activity noted about both wrists is most likely   degenerative. Activity noted about both sternoclavicular joints are most   likely degenerative.    IMPRESSION:     1. Multifocal areas of increased activity which are most  likely   degenerative. Thoracolumbar plain film should be consideredto exclude   metastatic disease. Plain film images of both shoulders should be     considered to exclude a focal abnormality.  2. Focus of increased activity noted in the lateral middle left foot.   This could relate to a stress fracture. Plain film images of the left   foot can be obtained.    Thank you for the opportunity to contribute to the care of your patient.           Verified By: Osa Craver, M.D., MD   Orders:    Leuprolide Depot injection, (  Lupron Depot injection )  30 mg, Intramuscular, once  [Waste Code: Chemo], 10-Jun-2011, Active, Standard  Assessment and Plan:  Impression:   Gleason 8 adenocarcinoma of the prostate in 74 year old male clinical stage IIB  Plan:   based on the PSA being over 10 and Gleason 8 score I believe patient would benefit from hormonal manipulation therapy using Lupron and Odessa Fleming for one year. He also believe his pelvic lymph nodes her at risk based on his young age would opt for IMRT radiation therapy to treat his prostate and pelvic nodes up to 5000 cGy. I would then boost his prostate using I-125 interstitial implant to deliver another 100 gray to his prostate volume. I discussed other treatment options I do not think surgical excision of his prostate is an option based on above-stated fax of probable pelvic lymph node involvement and high degree of extracapsular extension of disease. I have discussed the case personally with Dr. Bernardo Heater who agrees on my treatment plan. I have set him up for IMRT treatment planning early next week. I have started him on a 4 month Lupron Depo injection today. We will also plan for volume study as well as implant over the next several months. Risks and benefits of treatment including increased urinary frequency and urgency, possible diarrhea, and possible fatigue were all discussed with the patient and his main side effect possibilities.  Patient agrees with my treatment plan. Appointments were given as described.  I would like to take this opportunity to thank you for allowing me to continue to participate in this patient's care.  CC Referral:   cc: Dr. Bernardo Heater, Dr. Golden Pop   Electronic Signatures: Baruch Gouty Roda Shutters (MD)  (Signed 21-Feb-13 14:53)  Authored: HPI, Diagnosis, Past Hx, PFSH, Allergies, Home Meds, ROS, Nursing Notes, Physical Exam, Other Results, Orders, Encounter Assessment and Plan, CC Referring Physician   Last Updated: 21-Feb-13 14:53 by Armstead Peaks (MD)

## 2014-08-11 NOTE — H&P (Signed)
PATIENT NAME:  Duane Hill, Duane Hill MR#:  [id number] DATE OF BIRTH:  12-Mar-1941  DATE OF ADMISSION:  09/08/2011  HISTORY OF PRESENT ILLNESS: Mr. Bracamonte is a 74 year old male followed for a slightly rising PSA over several years. Most recently it was 10.4 in January of 2013. The patient did have some frequency and nocturia x4 to 5 and had been on Flomax. He underwent a transrectal ultrasound-guided biopsy by Dr. Bernardo Heater showing adenocarcinoma of the prostate Gleason score of 8 (4 + 4). The patient started IMRT radiation therapy as being a stage II-B (T1C N0 M0 with a Gleason score of 8 and PSA of approximately 11). He completed IMRT radiation therapy to 5000 cGy. Again he had minimal symptoms including again frequency, urgency, and nocturia. He is now scheduled for an I-125 interstitial implant to boost his prostate another 100 gray. Doing well at the present time.   PAST MEDICAL HISTORY:  1. Diabetes. 2. Acid reflux.  3. Hyperlipidemia.  4. Hypertension.   FAMILY HISTORY: Positive for hypertension, adult onset diabetes, benign prostatic hypertrophy, and CVA.   SOCIAL HISTORY: 40 pack-year smoking history. Social EtOH use history.   ALLERGIES: No known medical allergies.   HOME MEDICATIONS:  1. Triplex 135 mg 1 tab orally once a day.  2. Simvastatin 40 mg orally 1 tablet once a day.  3. Quinapril 20 mg oral tablet 1 tablet orally once a day.  4. Hydrochlorothiazide/quinapril 1 tab orally once a day.  5. Prilosec 20 mg oral delayed release capsule 1 capsule orally once a day.  6. Metformin 500 mg 1 tablet once a day.  7. Coreg 25 mg oral tablet one time orally 2 times a day.  8. Astepro 205.5 mcg per INH 0.15% nasal spray one nasal spray 2 times a day as needed for nasal congestion.  9. Albuterol 2 puffs inhaled every four hours as needed.  10. Aspirin 81 mg oral tablet one tablet oral once a day which he has been asked to discontinue two weeks prior to procedure.  11. Omega-3 1000 mg 2 caps  orally 2 times a day.   REVIEW OF SYSTEMS: Unchanged from initial consultation dated 06/10/2011.   PHYSICAL EXAMINATION:   VITAL SIGNS: Vital signs stable. The patient is afebrile. Pulse 54, respirations 20, blood pressure 128/80.   GENERAL: Well developed, well nourished male in no acute distress.   LUNGS: Clear to auscultation and percussion.   HEART: Regular rate and rhythm without murmur, rub, or thrill.   ABDOMEN: Benign with no organomegaly or masses noted.  RECTAL: Not performed.   NEUROLOGIC: Motor, sensory, and DTR levels are equal and symmetric in the upper and lower extremities. Cranial nerves II through XII are grossly intact.   EXTREMITIES: No peripheral adenopathy or edema is identified.   IMPRESSION: Stage II-B adenocarcinoma of the prostate in a 74 year old male who has successfully completed IMRT radiation therapy.   PLAN: At this time the patient will undergo I-125 interstitial implant to deliver 100 gray boost to his prostate. Volume study has been performed and seeds have been ordered. The patient was advised to the risks and benefits of treatment. He will have seed implant by myself and Dr. Bernardo Heater.       Thanks for allowing me to continue to participate in his care.   ____________________________ Armstead Peaks, MD gsc:drc D: 09/07/2011 14:06:00 ET T: 09/07/2011 14:31:08 ET JOB#: [id number]  cc: Armstead Peaks, MD, <Dictator> Armstead Peaks MD ELECTRONICALLY SIGNED 09/09/2011 9:19

## 2014-08-12 ENCOUNTER — Emergency Department
Admit: 2014-08-12 | Disposition: A | Discharge status: Home / Self Care | Payer: Self-pay | Admitting: Emergency Medicine

## 2014-08-12 DIAGNOSIS — R1030 Lower abdominal pain, unspecified: Secondary | ICD-10-CM | POA: Diagnosis not present

## 2014-08-12 DIAGNOSIS — R319 Hematuria, unspecified: Secondary | ICD-10-CM | POA: Diagnosis not present

## 2014-08-12 DIAGNOSIS — R339 Retention of urine, unspecified: Secondary | ICD-10-CM | POA: Diagnosis not present

## 2014-08-12 LAB — BASIC METABOLIC PANEL
Anion Gap: 8 (ref 7–16)
BUN: 20 mg/dL
Calcium, Total: 9.4 mg/dL
Chloride: 106 mmol/L
Co2: 24 mmol/L
Creatinine: 1.31 mg/dL — ABNORMAL HIGH
EGFR (Non-African Amer.): 54 — ABNORMAL LOW
GLUCOSE: 156 mg/dL — AB
Potassium: 4.2 mmol/L
Sodium: 138 mmol/L

## 2014-08-12 LAB — CBC WITH DIFFERENTIAL/PLATELET
Basophil #: 0.1 10*3/uL (ref 0.0–0.1)
Basophil %: 0.5 %
Eosinophil #: 0.3 10*3/uL (ref 0.0–0.7)
Eosinophil %: 2.7 %
HCT: 39 % — AB (ref 40.0–52.0)
HGB: 12.8 g/dL — ABNORMAL LOW (ref 13.0–18.0)
LYMPHS PCT: 14 %
Lymphocyte #: 1.7 10*3/uL (ref 1.0–3.6)
MCH: 29 pg (ref 26.0–34.0)
MCHC: 32.9 g/dL (ref 32.0–36.0)
MCV: 88 fL (ref 80–100)
Monocyte #: 0.5 x10 3/mm (ref 0.2–1.0)
Monocyte %: 4.2 %
NEUTROS PCT: 78.6 %
Neutrophil #: 9.3 10*3/uL — ABNORMAL HIGH (ref 1.4–6.5)
Platelet: 264 10*3/uL (ref 150–440)
RBC: 4.42 10*6/uL (ref 4.40–5.90)
RDW: 13.7 % (ref 11.5–14.5)
WBC: 11.8 10*3/uL — AB (ref 3.8–10.6)

## 2014-08-13 LAB — URINALYSIS, COMPLETE
SQUAMOUS EPITHELIAL: NONE SEEN
Specific Gravity: 1.018 (ref 1.003–1.030)

## 2014-08-14 ENCOUNTER — Inpatient Hospital Stay
Admit: 2014-08-14 | Disposition: A | Discharge status: Home / Self Care | Payer: Self-pay | Attending: Internal Medicine | Admitting: Internal Medicine

## 2014-08-14 DIAGNOSIS — I1310 Hypertensive heart and chronic kidney disease without heart failure, with stage 1 through stage 4 chronic kidney disease, or unspecified chronic kidney disease: Secondary | ICD-10-CM | POA: Diagnosis present

## 2014-08-14 DIAGNOSIS — R310 Gross hematuria: Secondary | ICD-10-CM | POA: Diagnosis not present

## 2014-08-14 DIAGNOSIS — R319 Hematuria, unspecified: Secondary | ICD-10-CM | POA: Diagnosis not present

## 2014-08-14 DIAGNOSIS — E119 Type 2 diabetes mellitus without complications: Secondary | ICD-10-CM | POA: Diagnosis not present

## 2014-08-14 DIAGNOSIS — E785 Hyperlipidemia, unspecified: Secondary | ICD-10-CM | POA: Diagnosis not present

## 2014-08-14 DIAGNOSIS — I10 Essential (primary) hypertension: Secondary | ICD-10-CM | POA: Diagnosis not present

## 2014-08-14 DIAGNOSIS — Z87442 Personal history of urinary calculi: Secondary | ICD-10-CM | POA: Diagnosis not present

## 2014-08-14 DIAGNOSIS — N189 Chronic kidney disease, unspecified: Secondary | ICD-10-CM | POA: Diagnosis not present

## 2014-08-14 DIAGNOSIS — K219 Gastro-esophageal reflux disease without esophagitis: Secondary | ICD-10-CM | POA: Diagnosis present

## 2014-08-14 DIAGNOSIS — E1140 Type 2 diabetes mellitus with diabetic neuropathy, unspecified: Secondary | ICD-10-CM | POA: Diagnosis not present

## 2014-08-14 DIAGNOSIS — N289 Disorder of kidney and ureter, unspecified: Secondary | ICD-10-CM | POA: Diagnosis not present

## 2014-08-14 DIAGNOSIS — N179 Acute kidney failure, unspecified: Secondary | ICD-10-CM | POA: Diagnosis not present

## 2014-08-14 DIAGNOSIS — Z87891 Personal history of nicotine dependence: Secondary | ICD-10-CM | POA: Diagnosis not present

## 2014-08-14 DIAGNOSIS — N3090 Cystitis, unspecified without hematuria: Secondary | ICD-10-CM | POA: Diagnosis not present

## 2014-08-14 DIAGNOSIS — Z7982 Long term (current) use of aspirin: Secondary | ICD-10-CM | POA: Diagnosis not present

## 2014-08-14 DIAGNOSIS — C61 Malignant neoplasm of prostate: Secondary | ICD-10-CM | POA: Diagnosis present

## 2014-08-14 DIAGNOSIS — N3001 Acute cystitis with hematuria: Secondary | ICD-10-CM | POA: Diagnosis present

## 2014-08-14 LAB — PROTIME-INR
INR: 1.1
Prothrombin Time: 14.3 secs

## 2014-08-14 LAB — BASIC METABOLIC PANEL
Anion Gap: 11 (ref 7–16)
BUN: 23 mg/dL — AB
CHLORIDE: 100 mmol/L — AB
CO2: 28 mmol/L
CREATININE: 1.56 mg/dL — AB
Calcium, Total: 9.6 mg/dL
EGFR (Non-African Amer.): 44 — ABNORMAL LOW
GFR CALC AF AMER: 51 — AB
Glucose: 186 mg/dL — ABNORMAL HIGH
Potassium: 4.2 mmol/L
Sodium: 139 mmol/L

## 2014-08-14 LAB — HEMOGLOBIN: HGB: 11.8 g/dL — ABNORMAL LOW (ref 13.0–18.0)

## 2014-08-14 LAB — CBC
HCT: 37.9 % — AB (ref 40.0–52.0)
HGB: 12.6 g/dL — ABNORMAL LOW (ref 13.0–18.0)
MCH: 29.3 pg (ref 26.0–34.0)
MCHC: 33.3 g/dL (ref 32.0–36.0)
MCV: 88 fL (ref 80–100)
Platelet: 245 10*3/uL (ref 150–440)
RBC: 4.31 10*6/uL — ABNORMAL LOW (ref 4.40–5.90)
RDW: 14 % (ref 11.5–14.5)
WBC: 10 10*3/uL (ref 3.8–10.6)

## 2014-08-14 LAB — URINALYSIS, COMPLETE
Bacteria: NONE SEEN
SPECIFIC GRAVITY: 1.012 (ref 1.003–1.030)

## 2014-08-14 LAB — HEMATOCRIT: HCT: 35.6 % — AB (ref 40.0–52.0)

## 2014-08-15 LAB — CBC WITH DIFFERENTIAL/PLATELET
Basophil #: 0 10*3/uL (ref 0.0–0.1)
Basophil %: 0.5 %
Eosinophil #: 0.3 10*3/uL (ref 0.0–0.7)
Eosinophil %: 3.3 %
HCT: 34.2 % — ABNORMAL LOW (ref 40.0–52.0)
HGB: 11.3 g/dL — ABNORMAL LOW (ref 13.0–18.0)
Lymphocyte #: 1.8 10*3/uL (ref 1.0–3.6)
Lymphocyte %: 19.5 %
MCH: 29 pg (ref 26.0–34.0)
MCHC: 33 g/dL (ref 32.0–36.0)
MCV: 88 fL (ref 80–100)
Monocyte #: 0.6 x10 3/mm (ref 0.2–1.0)
Monocyte %: 6.6 %
Neutrophil #: 6.3 10*3/uL (ref 1.4–6.5)
Neutrophil %: 70.1 %
Platelet: 210 10*3/uL (ref 150–440)
RBC: 3.9 10*6/uL — ABNORMAL LOW (ref 4.40–5.90)
RDW: 13.8 % (ref 11.5–14.5)
WBC: 9 10*3/uL (ref 3.8–10.6)

## 2014-08-15 LAB — BASIC METABOLIC PANEL
Anion Gap: 7 (ref 7–16)
BUN: 23 mg/dL — ABNORMAL HIGH
Calcium, Total: 8.8 mg/dL — ABNORMAL LOW
Chloride: 103 mmol/L
Co2: 29 mmol/L
Creatinine: 1.28 mg/dL — ABNORMAL HIGH
EGFR (African American): >60
EGFR (Non-African Amer.): 56 — ABNORMAL LOW
Glucose: 160 mg/dL — ABNORMAL HIGH
Potassium: 3.5 mmol/L
Sodium: 139 mmol/L

## 2014-08-15 LAB — HEMOGLOBIN: HGB: 11.2 g/dL — AB (ref 13.0–18.0)

## 2014-08-15 LAB — HEMOGLOBIN A1C: Hemoglobin A1C: 6.7 % — ABNORMAL HIGH

## 2014-08-15 LAB — TSH: Thyroid Stimulating Horm: 2.796 u[IU]/mL

## 2014-08-15 LAB — HEMATOCRIT: HCT: 34.2 % — ABNORMAL LOW (ref 40.0–52.0)

## 2014-08-16 LAB — CBC WITH DIFFERENTIAL/PLATELET
BASOS PCT: 0.9 %
Basophil #: 0.1 10*3/uL (ref 0.0–0.1)
Eosinophil #: 0.4 10*3/uL (ref 0.0–0.7)
Eosinophil %: 6.1 %
HCT: 33.1 % — AB (ref 40.0–52.0)
HGB: 10.9 g/dL — AB (ref 13.0–18.0)
Lymphocyte #: 1.3 10*3/uL (ref 1.0–3.6)
Lymphocyte %: 22.8 %
MCH: 28.9 pg (ref 26.0–34.0)
MCHC: 32.9 g/dL (ref 32.0–36.0)
MCV: 88 fL (ref 80–100)
Monocyte #: 0.4 x10 3/mm (ref 0.2–1.0)
Monocyte %: 6.3 %
Neutrophil #: 3.7 10*3/uL (ref 1.4–6.5)
Neutrophil %: 63.9 %
Platelet: 233 10*3/uL (ref 150–440)
RBC: 3.77 10*6/uL — ABNORMAL LOW (ref 4.40–5.90)
RDW: 13.4 % (ref 11.5–14.5)
WBC: 5.8 10*3/uL (ref 3.8–10.6)

## 2014-08-16 LAB — BASIC METABOLIC PANEL
Anion Gap: 8 (ref 7–16)
BUN: 28 mg/dL — ABNORMAL HIGH
CALCIUM: 8.9 mg/dL
CHLORIDE: 106 mmol/L
Co2: 26 mmol/L
Creatinine: 1.11 mg/dL
EGFR (African American): >60
EGFR (Non-African Amer.): >60
Glucose: 150 mg/dL — ABNORMAL HIGH
POTASSIUM: 3.5 mmol/L
Sodium: 140 mmol/L

## 2014-08-16 LAB — URINALYSIS, COMPLETE
Bilirubin,UR: NEGATIVE
Glucose,UR: 50 mg/dL (ref 0–75)
KETONE: NEGATIVE
Leukocyte Esterase: NEGATIVE
Nitrite: NEGATIVE
Ph: 6 (ref 4.5–8.0)
Protein: 30
SPECIFIC GRAVITY: 1.016 (ref 1.003–1.030)
Squamous Epithelial: NONE SEEN

## 2014-08-18 LAB — URINE CULTURE

## 2014-08-18 NOTE — Consult Note (Signed)
Chief Complaint:  Subjective/Chief Complaint 74yo male with a history of prostate cancer, s/p external beam radiation and brachytherapy boost, presented to ER with gross hematuria and clot retention.  Otherwise asymptomatic.  CBI was initiated.   VITAL SIGNS/ANCILLARY NOTES: **Vital Signs.:   28-Apr-16 05:33  Vital Signs Type Routine  Temperature Temperature (F) 98.4  Celsius 36.8  Temperature Source oral  Pulse Pulse 61  Respirations Respirations 20  Systolic BP Systolic BP 277  Diastolic BP (mmHg) Diastolic BP (mmHg) 66  Mean BP 79  Pulse Ox % Pulse Ox % 94  Pulse Ox Activity Level  At rest  Oxygen Delivery Room Air/ 21 %  *Intake and Output.:   Daily 28-Apr-16 07:00  Grand Totals Intake:  82423 Output:  20035    Net:  -9495 24 Hr.:  -9495  Oral Intake      In:  480  Urine ml     Out:  5400  Continuous Bladder Irrigation ml     In:  10060    Out:  53614    Net:  -4575  Length of Stay Totals Intake:  10540 Output:  20035    Net:  -9495   Brief Assessment:  GEN no acute distress   Cardiac no cyanosis, skin warm   Respiratory normal resp effort   Gastrointestinal details normal Nondistended   EXTR negative cyanosis/clubbing, negative edema   Additional Physical Exam Urine clear on low flow CBI   Lab Results: Routine Chem:  27-Apr-16 04:28   Glucose, Serum  186 (65-99 NOTE: New Reference Range  06/25/14)  BUN  23 (6-20 NOTE: New Reference Range  06/25/14)  Creatinine (comp)  1.56 (0.61-1.24 NOTE: New Reference Range  06/25/14)  Sodium, Serum 139 (135-145 NOTE: New Reference Range  06/25/14)  Potassium, Serum 4.2 (3.5-5.1 NOTE: New Reference Range  06/25/14)  Chloride, Serum  100 (101-111 NOTE: New Reference Range  06/25/14)  CO2, Serum 28 (22-32 NOTE: New Reference Range  06/25/14)  Calcium (Total), Serum 9.6 (8.9-10.3 NOTE: New Reference Range  06/25/14)  Anion Gap 11  eGFR (African American)  51  eGFR (Non-African American)  44 (eGFR  values <37mL/min/1.73 m2 may be an indication of chronic kidney disease (CKD). Calculated eGFR is useful in patients with stable renal function. The eGFR calculation will not be reliable in acutely ill patients when serum creatinine is changing rapidly. It is not useful in patients on dialysis. The eGFR calculation may not be applicable to patients at the low and high extremes of body sizes, pregnant women, and vegetarians.)    06:29   Result Comment Urinalysis - Unable to obtain valid dipstick results  - due to interference of gross blood in the  - specimen.  Result(s) reported on 14 Aug 2014 at 07:38AM.  28-Apr-16 04:30   Glucose, Serum  160 (65-99 NOTE: New Reference Range  06/25/14)  BUN  23 (6-20 NOTE: New Reference Range  06/25/14)  Creatinine (comp)  1.28 (0.61-1.24 NOTE: New Reference Range  06/25/14)  Sodium, Serum 139 (135-145 NOTE: New Reference Range  06/25/14)  Potassium, Serum 3.5 (3.5-5.1 NOTE: New Reference Range  06/25/14)  Chloride, Serum 103 (101-111 NOTE: New Reference Range  06/25/14)  CO2, Serum 29 (22-32 NOTE: New Reference Range  06/25/14)  Calcium (Total), Serum  8.8 (8.9-10.3 NOTE: New Reference Range  06/25/14)  Anion Gap 7  eGFR (African American) >60  eGFR (Non-African American)  56 (eGFR values <60mL/min/1.73 m2 may be an indication of chronic kidney disease (CKD).  Calculated eGFR is useful in patients with stable renal function. The eGFR calculation will not be reliable in acutely ill patients when serum creatinine is changing rapidly. It is not useful in patients on dialysis. The eGFR calculation may not be applicable to patients at the low and high extremes of body sizes, pregnant women, and vegetarians.)   Assessment/Plan:  Invasive Device Daily Assessment of Necessity:  Does the patient currently have any of the following indwelling devices? foley   Indwelling Urinary Catheter continued, requirement due to   Reason to continue  Indwelling Urinary Catheter bladder irrigation is required   Assessment/Plan:  Assessment Patient reports feeling well.  Urine clear this morning without clots.  VSS. BMP showing slight increase in Cr from 1.28 to 1.56.  Hgb stable 11.3. WBC 9.0.   Plan 1) Stop CBI.  If urine remains clear for 1 hour Foley can be removed for voiding trial. 2) Continue anitbiotics for UTI pending culture results and d/c on po if indicated. 3) Patient will need outpatient followup with a urology and can follow up at [facility] for a complete hematuria workup, including CT Urogram as well as office cystoscopy to rule out any underlying or contributory pathology.   Electronic Signatures: Sherlynn Stalls (MD)   (Signed 29-Apr-16 08:13)  Co-Signer: Chief Complaint, Brief Assessment, Assessment/Plan, VITAL SIGNS/ANCILLARY NOTES, Lab Results Herbert Moors (NP)   (Signed 28-Apr-16 09:16)  Authored: Chief Complaint, Brief Assessment, Assessment/Plan, VITAL SIGNS/ANCILLARY NOTES, Lab Results  Last Updated: 29-Apr-16 08:13 by Sherlynn Stalls (MD)

## 2014-08-18 NOTE — Consult Note (Signed)
PATIENT NAME:  Duane, Hill MR#:  [id number] DATE OF BIRTH:  [birth year]  DATE OF CONSULTATION:  08/14/2014  REFERRING PHYSICIAN:   Dr. Rosilyn Mings.  CONSULTING PHYSICIAN:  Sherlynn Stalls, MD.  REASON FOR CONSULTATION:  Gross hematuria.    HISTORY OF PRESENT ILLNESS:  This is a 74 year old male with a history of prostate cancer, status post external beam radiation with brachytherapy boost approximately 3 years ago, who presented to the Emergency Department 2 days ago following a week of gross hematuria and ultimately clot retention.  In the Emergency Room, a catheter was placed and his bladder was irrigated, and he was ultimately discharged from the ER with the catheter in place. Unfortunately, the catheter became obstructed and he returned to the Emergency Room urgently overnight for further management.   Since developing the hematuria, he has had absolutely no other symptoms other than some difficulty voiding related to passing clots.  He denies any dysuria but does have some baseline urinary frequency.  He has no other history of gross hematuria other than briefly following placement of his brachytherapy seeds.  No fevers or chills.  Some nausea but no vomiting.  No history of urinary tract infections.   The patient reports that he was previously followed by Dr. Bernardo Heater of [facility] urology but has not seen him in greater than a year.  He has also not seen Dr. Baruch Gouty in at least that time for PSA monitoring.  He does have a remote history of smoking but quit in the mid 1980s.   PAST MEDICAL HISTORY:  1. Diabetes.  2. Hypertension.  3. Prostate cancer.  4. GERD.  5. Hyperlipidemia.  6. History of nephrolithiasis.   PAST SURGICAL HISTORY:  Status post brachytherapy.   SOCIAL HISTORY:  Currently lives with son and denies any current smoking but does have a remote history of smoking, quitting in the mid 2s.  No other illicit drugs.  He does drink social alcohol.    FAMILY HISTORY:   No family history of prostate cancer or bladder cancer.   REVIEW OF SYSTEMS:  A 12-point review of systems was performed and is negative other than as per HPI.    MEDICATIONS:  Please see admission H and P for full list of preadmission medications.  Of note, he does take a baby aspirin each morning and was started on Cipro upon presentation in the Emergency Room 2 days ago.    PHYSICAL EXAMINATION: VITAL SIGNS:  Temperature 97.7, pulse 66, blood pressure 126/67, respirations 20, pulse oximetry 95% on room air.   GENERAL:  In no acute distress, alert and oriented.  HEENT:  Moist mucous membranes.  NECK:  Supple.  No masses.  Trachea is midline.  LUNGS:  No use of accessory muscles, no respiratory distress.  CARDIOVASCULAR:  No lower extremity edema, clubbing, or cyanosis.  ABDOMEN:  Soft, nontender, nondistended.  Bladder is nonpalpable.  GENITOURINARY:  Circumcised phallus with an 18-French 3-way Foley catheter in place.   CBI is running which is light pink on a moderate drip.  There is an occasional old-appearing clot. Scrotum without edema.  Testicles descended bilaterally.   RECTAL:  Deferred at this time.  SKIN:  No rashes, bruises, or lesions. LYMPHATIC:  No cervical or inguinal lymphadenopathy.  NEUROLOGIC:  Grossly intact.  Moving all 4 extremities.  Alert and oriented x 3.  PSYCHIATRIC:   Appropriate affect.  Actively engaged and interactive.   LABORATORY DATA:  WBC 11.8 upon initial presentation to the  Emergency Room, 10.0 today upon admission.  Creatinine is 1.58 which is up slightly from the Emergency Room visit two days ago from 1.31.  Hemoglobin is 11.8 which is down slightly from 12.6 upon admission.  UA initially upon admission to the Emergency Room on Monday was somewhat suspicious for infection but floridly infected-appearing today.  Urine culture is pending.  No imaging for review.   PROCEDURE:  Using standard sterile technique, his catheter was irrigated aggressively with  over 2 liters of normal saline yielding approximately 100 mL of very small old-appearing clot. By the end of hand irrigation, I was able to clear his urine to a very light pink and not able to obtain any further clots.  He was restarted on slow CBI with light pink urine.   ASSESSMENT AND PLAN:  This is a 74 year old male with a history of prostate cancer, status post external beam radiation with brachytherapy boost, who presented initially to the Emergency Room in clot retention.  Possible etiologies for gross hematuria include urinary tract infection which is certainly contributing to his ongoing symptoms today but may have not caused the initial event, radiation cystitis, prostatic bleeding, or an underlying bladder tumor in the setting of history of radiation and smoking.  He does not appear to be actively bleeding at this time and only has what appears to be an old clot in his bladder which I irrigated out today.   RECOMMENDATIONS:  1. Continue to monitor CBC and BMP.  2. Continue CBI overnight.  Will assess and try to wean off in the morning.  If he fails to wean, I recommend upsizing his 3-way catheter from an 18 to at least a 73 [place] to clear him of his remaining clot.  3. Treat urinary tract infection based on culture and sensitivity data.  4. Ideally, if bleeding stops, patient can have a voiding trial prior to discharge.  5. The patient will need outpatient followup with urology and is welcome to follow up at [facility] for a complete hematuria workup, including CT urogram as well as office cystoscopy to rule out any underlying or contributory pathology.   Thank you for allowing me to participate in the care of this patient.  I will continue to follow him until his hematuria is improved or resolved.     ____________________________ Sherlynn Stalls, MD ajb:kc D: 08/14/2014 20:15:18 ET T: 08/14/2014 21:00:41 ET JOB#: [id number]  cc: Sherlynn Stalls, MD,  <Dictator> Sherlynn Stalls MD ELECTRONICALLY SIGNED 08/15/2014 16:46

## 2014-08-18 NOTE — Consult Note (Signed)
Chief Complaint:  Subjective/Chief Complaint CBI stopped yesterday AM, Foley removed and patient voiding well since.  Urine clear with minimal clots.   VITAL SIGNS/ANCILLARY NOTES: **Vital Signs.:   29-Apr-16 05:48  Vital Signs Type Routine  Temperature Temperature (F) 97.8  Celsius 36.5  Temperature Source oral  Pulse Pulse 57  Respirations Respirations 20  Systolic BP Systolic BP 193  Diastolic BP (mmHg) Diastolic BP (mmHg) 74  Mean BP 90  Pulse Ox % Pulse Ox % 94  Pulse Ox Activity Level  At rest  Oxygen Delivery Room Air/ 21 %  *Intake and Output.:   Daily 29-Apr-16 07:00  Grand Totals Intake:  720 Output:  2300    Net:  -1580 24 Hr.:  -1580  Oral Intake      In:  720  Urine ml     Out:  2300  Length of Stay Totals Intake:  11260 Output:  22335    Net:  -11075   Brief Assessment:  GEN well developed, well nourished, no acute distress   Respiratory normal resp effort  clear BS  no use of accessory muscles   Gastrointestinal details normal Soft  Nontender  Nondistended  No gaurding   EXTR negative cyanosis/clubbing   Lab Results: Routine Chem:  29-Apr-16 04:35   Glucose, Serum  150 (65-99 NOTE: New Reference Range  06/25/14)  BUN  28 (6-20 NOTE: New Reference Range  06/25/14)  Creatinine (comp) 1.11 (0.61-1.24 NOTE: New Reference Range  06/25/14)  Sodium, Serum 140 (135-145 NOTE: New Reference Range  06/25/14)  Potassium, Serum 3.5 (3.5-5.1 NOTE: New Reference Range  06/25/14)  Chloride, Serum 106 (101-111 NOTE: New Reference Range  06/25/14)  CO2, Serum 26 (22-32 NOTE: New Reference Range  06/25/14)  Calcium (Total), Serum 8.9 (8.9-10.3 NOTE: New Reference Range  06/25/14)  Anion Gap 8  eGFR (African American) >60  eGFR (Non-African American) >60 (eGFR values <55m/min/1.73 m2 may be an indication of chronic kidney disease (CKD). Calculated eGFR is useful in patients with stable renal function. The eGFR calculation will not be reliable in  acutely ill patients when serum creatinine is changing rapidly. It is not useful in patients on dialysis. The eGFR calculation may not be applicable to patients at the low and high extremes of body sizes, pregnant women, and vegetarians.)  Routine Hem:  29-Apr-16 04:35   WBC (CBC) 5.8  RBC (CBC)  3.77  Hemoglobin (CBC)  10.9  Hematocrit (CBC)  33.1  Platelet Count (CBC) 233  MCV 88  MCH 28.9  MCHC 32.9  RDW 13.4  Neutrophil % 63.9  Lymphocyte % 22.8  Monocyte % 6.3  Eosinophil % 6.1  Basophil % 0.9  Neutrophil # 3.7  Lymphocyte # 1.3  Monocyte # 0.4  Eosinophil # 0.4  Basophil # 0.1 (Result(s) reported on 16 Aug 2014 at 0[facility])   Assessment/Plan:  Invasive Device Daily Assessment of Necessity:  Does the patient currently have any of the following indwelling devices? foley   Indwelling Urinary Catheter continued, requirement due to   Reason to continue Indwelling Urinary Catheter bladder irrigation is required   Assessment/Plan:  Assessment 74yo M with gross hematuria, no voiding with catheter out.  Cr improving.   Plan 1) Continue anitbiotics for UTI pending culture results and d/c on po if indicate. 2) Please check PVR, if greater than 100 ml.   3) Patient will need outpatient followup with a urology [facility] [place] [place] [facility],  including CT Urogram as well as office cystoscopy to rule out any underlying or contributory pathology.  OK for discharge per urology.   Electronic Signatures: Sherlynn Stalls (MD)  (Signed 29-Apr-16 08:48)  Authored: Chief Complaint, VITAL SIGNS/ANCILLARY NOTES, Brief Assessment, Lab Results, Assessment/Plan   Last Updated: 29-Apr-16 08:48 by Sherlynn Stalls (MD)

## 2014-08-18 NOTE — H&P (Addendum)
PATIENT NAME:  Duane Hill, Duane Hill MR#:  [id number] DATE OF BIRTH:  June 18, 1940  DATE OF ADMISSION:  08/14/2014  REFERRING PHYSICIAN: Lurline Hare, MD   PRIMARY CARE PHYSICIAN: Golden Pop, MD  ADMITTING DIAGNOSIS: Acute cystitis with gross hematuria.   HISTORY OF PRESENT ILLNESS: This is a 74 year old Caucasian male, who presents to the Emergency Department complaining of grossly bloody urine. He was seen in the Emergency Department 2 days ago for urinary retention and had a Foley catheter placed. Procedure was mildly traumatic and he had some blood in his urine afterward, which cleared with irrigation. However, today, he states that he has been trying to use the restroom and has only been producing grossly bloody urine. He has had some mild nausea but denies fevers, vomiting, or diarrhea. He also denies any chest pains or shortness of breath. In the Emergency Department, the patient's urine would not clear with individual irrigations of the Foley catheter and so he was placed on continuous bladder irrigation, which prompted the Emergency Department called for admission.   REVIEW OF SYSTEMS:  CONSTITUTIONAL: The patient denies fever or weakness.  EYES: Denies blurred vision or inflammation.  EARS, NOSE AND THROAT: Denies tinnitus or sore throat.  RESPIRATORY: Denies cough or shortness of breath.  CARDIOVASCULAR: Chest pain, palpitations, orthopnea, paroxysmal nocturnal dyspnea.  GASTROINTESTINAL: Denies vomiting, diarrhea, or abdominal pain. The patient has. He admits to some nausea prior to admission.  GENITOURINARY: Denies dysuria, but admits to urinary hesitancy.  ENDOCRINE: Denies polyuria or polydipsia.  HEMATOLOGIC AND LYMPHATIC: Admits to easy bleeding\r INTEGUMENTARY: Denies rashes or lesions.  MUSCULOSKELETAL: Denies arthralgias or myalgias.  NEUROLOGIC: He admits to paresthesias in his extremities, but denies dysarthria.  PSYCHIATRIC: Denies depression or suicidal ideation.   PAST  MEDICAL HISTORY: Diabetes mellitus type 2 with peripheral neuropathy, hypertension, prostate, gastroesophageal reflux disease, hyperlipidemia and history of kidney stones.   PAST SURGICAL HISTORY: Brachytherapy..   SOCIAL HISTORY: The patient lives with his son. He does not smoke or doing drugs. He admits to wine  with meals. He quit smoking more than 30 years ago.   FAMILY HISTORY: The patient's father is deceased from complications of congestive heart failure, hypertension and diabetes.   MEDICATIONS:  1. Albuterol with ipratropium 90 mcg 2 puffs inhaled every 4 hours as needed for coughing, wheezing, or shortness of breath.  2. Aspirin 81 mg 1 tablet p.o. every morning.  3. Astepro 205.5 mg/inhalations with 0.15% nasal spray 2 sprays to each nostril once a day as needed for congestion.  4. Cipro 500, 1 tablet p.o. b.i.d.  5. Gabapentin 100 mg 2 capsules p.o. b.i.d.  6. Coreg 25 mg 1 tablet p.o. b.i.d. 7. Hydrochlorothiazide with Quinalapril 12.5 mg/20 mg, 1 tablet p.o. every morning.  8. Metformin 500 mg 1 tablet p.o. b.i.d.  9. Prilosec 20 mg 1 capsule p.o. every morning.  10. Simvastatin 40 mg 1 tablet p.o. every morning.  11. Triplex 135 mg 1 tablet p.o. every morning.  12. Tylenol caplet extra strength 500 mg 2 tablets p.o. every 6 hours as needed.   ALLERGIES: NO KNOWN DRUG ALLERGIES.   Pertinent laboratory results and radiographic findings: Serum glucose is 186. BUN 23, creatinine 1.56, serum sodium 139, potassium 4.2, chloride 100, bicarbonate 28, calcium is 9.6. White blood cell count is 10, hemoglobin is 12.6, hematocrit is 37.9, platelet count 245,000. MCV is 88. INR is 1.1. Urinalysis shows red blood cells too numerous to count, as well as white blood cells to numerous to  count. There is no imaging.   PHYSICAL EXAMINATION:  VITAL SIGNS: Temperature is 98.3, pulse 75, respirations 20, blood pressure 135/70, pulse oximetry is 97% on room air.   GENERAL: The patient is alert  and oriented x3 in no apparent distress.  HEENT: Normocephalic, atraumatic. Pupils equal, round, and reactive to light and accommodation. Extraocular movements are intact. Mucous membranes are moist.  NECK: Trachea is midline. No adenopathy. Thyroid is nonpalpable and nontender.  CHEST: Symmetric and atraumatic.  CARDIOVASCULAR: Regular rate and rhythm. Normal as normal S1, S2. No rubs, clicks, or murmurs appreciated.  LUNGS: Clear to auscultation bilaterally. Normal effort and excursion.  ABDOMEN: Positive bowel sounds. Soft, nontender, nondistended. No hepatosplenomegaly.  GENITOURINARY: Foley is in place. .  MUSCULOSKELETAL: The patient moves all 4 extremities equally. There is 5/5 strength in upper and lower extremities bilaterally. I have not observed the patient's gait.  SKIN: Warm and dry. No rashes or lesions.  EXTREMITIES: No clubbing, cyanosis, or edema.  NEUROLOGIC: Cranial nerves II through XII are grossly intact.  PSYCHIATRIC: Mood is normal. Affect is congruent. The patient has excellent judgment and insight into his medical condition.   ASSESSMENT AND PLAN: This is a 74 year old male admitted for cystitis with gross hematuria.   1. Cystitis:  The patient has grossly bloody urine and eight clots his Foley catheter. We have started continuous bladder irrigation and the patient's urinary retention  has been relieved. I have consulted gastroenterology urology.  2. Acute on chronic kidney disease: The patient's decrease in renal function is likely due to elbow obstruction. We will try to avoid nephrotoxic agents and continue to irrigate his bladder. 3. Diabetes type 2:  I have start the patient on sliding scale insulin while in the hospital. We will hold his metformin.  4. Hypertension: Continue hydrochlorothiazide. I have reduced the dose of his ACE inhibitor, due to  acute kidney injury.  5. Gastroesophageal reflux disease: I started the patient on proton pump inhibitor every morning.   6. Prostate cancer, stable status post brachytherapy. 7. Deep vein thrombosis prophylaxis:  Sequential compression devices. 8. Gastrointestinal prophylaxis is as above.   CODE STATUS:  FULL CODE.  TIME SPENT ON ADMISSION ORDERS AND PATIENT CARE: Approximately 35 minutes.   ____________________________ Norva Riffle. Marcille Blanco, MD msd:AT D: 08/14/2014 08:08:00 ET T: 08/14/2014 09:00:33 ET JOB#: [id number]  cc: Norva Riffle. Marcille Blanco, MD, <Dictator> Norva Riffle Roberto Romanoski MD ELECTRONICALLY SIGNED 08/20/2014 17:58

## 2014-08-19 DIAGNOSIS — C61 Malignant neoplasm of prostate: Secondary | ICD-10-CM | POA: Diagnosis not present

## 2014-08-19 DIAGNOSIS — K627 Radiation proctitis: Secondary | ICD-10-CM | POA: Diagnosis not present

## 2014-08-19 DIAGNOSIS — R319 Hematuria, unspecified: Secondary | ICD-10-CM | POA: Diagnosis not present

## 2014-08-19 DIAGNOSIS — I10 Essential (primary) hypertension: Secondary | ICD-10-CM | POA: Diagnosis not present

## 2014-08-19 NOTE — Discharge Summary (Addendum)
PATIENT NAME:  Duane Hill, Duane Hill MR#:  [id number] DATE OF BIRTH:  [birth year]  DATE OF ADMISSION:  08/14/2014 DATE OF DISCHARGE:  08/16/2014  DISCHARGE DIAGNOSES:  1.  Gross hematuria. 2.  Acute cystitis.   PROCEDURE PERFORMED:  Continuous bladder irrigation.   CONSULTATION:  Urology, Sherlynn Stalls, MD  CODE STATUS:  FULL CODE.    CONDITION AT THE TIME OF DISCHARGE:  Satisfactory.   DISCHARGE MEDICATIONS:  1.  Coreg 20 mg 1 tablet p.o. 2 times a day. 2.  Astepro 1 spray nasally 2 times a day as needed for nasal congestion or breathing.   3.  Advair 2 puffs inhalations every 4 hours as needed for shortness of breath.  4.  Triplex 125 mg 1 tablet p.o. once daily in the a.m.  5.  Simvastatin 40 mg once daily. 6.  Hydrochlorothiazide with quinapril 12.5/20 mg 1 tablet p.o. once daily in the a.m.  7.   Prilosec 20 mg 1 capsule p.o. once daily in a.m.  8.  Tylenol 500 mg 2 tablets p.o. every 6 hours as needed.   9.  Metformin 500 mg 1 tablet p.o. 2 times a day.   10.  Gabapentin 100 mg 2 capsules p.o. 2 times a day.   11.  Acetaminophen with oxycodone 325/5 mg 1 tablet p.o. every 6 hours as needed for moderate to severe pain. 12.  Colace 100 mg 1 capsule p.o. 2 times a day as needed for constipation.  13.  Bactrim DS 1 tablet p.o. 2 times a day for 7 days.   14.  Feosol caplet 1 capsule p.o. once daily.   15.  Discontinue the aspirin and ciprofloxacin at this time.    DISCHARGE INSTRUCTIONS:   1.  Discharging home with low-sodium, low-fat, and carbohydrate-controlled diet of regular consistency.  2.  Activity as tolerated.   3.  Followup with primary care physician, Dr. Rockey Situ in 4 to 5 days and urology, Dr. Erlene Quan in 2 weeks.  PCP to follow up on the urine culture results which are pending.   4.  Holding aspirin in view of hematuria.    BRIEF HISTORY:  The patient is a 74 year old Caucasian male who came into the ED with the  chief complaint of gross bloody urine.  He was seen  in the ED two days ago for urinary retention and had a Foley catheter placed.  However, he came into the ED with gross bloody urine.  Please see the history and physical for details.   HOSPITAL COURSE: The patient was admitted to the hospital.  1.  Gross hematuria. This can be from acute cystitis, but the patient was just passing frank blood. The patient was admitted to the hospital.  Continuous bladder irrigation was provided.  In between that, urinary irrigation was also done.  The patient was seen by urology.  They are recommending outpatient followup.  Eventually, the hematuria significantly improved. Yesterday his continuous bladder irrigation was stopped, the Foley catheter was removed and patient was able to void.  Today, his post void residual is at 137 mL.  Urology, Dr. Erlene Quan, has recommended outpatient cystoscopy and also he needs followup regarding his history of prostate cancer.  The patient had prostate cancer in the past and is status post brachytherapy.  The concern is bladder tumor versus other pathology.  2.  Acute cystitis.  Urine culture was ordered which is pending at this time.  White count is normal.  The patient is afebrile.  Curbside  discussion with Dr. Ola Spurr, infectious disease, has recommended Bactrim DS p.o. b.i.d. for 7 days.  Primary care physician is to follow up on the urine culture results and change the antibiotic if needed.  The patient is clinically stable.  3.  Acute on chronic renal insufficiency.  The patient's abnormal renal function is slightly due to unclear obstruction.   PLAN:  1.  Avoid nephrotoxins and primary care physician to follow up on the BMP, but with IV fluids the renal function significantly improved.  2.  Diabetes mellitus type 2.  In view of renal insufficiency, his metformin was held which is resumed today.  Renal function is much better.  It is at 1.11 today.  3.  Hypertension.  Hydrochlorothiazide and ACE inhibitor were held but they are  resumed as renal insufficiency resolved.  4.  Prostate cancer followed by the primary care physician and urology as an outpatient.  5.  GERD.   Continue GI prophylaxis.    Overall condition improved.  Hemoglobin is at 10.9.  Recommending the patient to take [facility] once daily.   PHYSICAL EXAMINATION:  VITAL SIGNS:  Temperature 98.1, pulse 63, respirations 20, blood pressure 124/74, pulse oximetry 94%.  GENERAL APPEARANCE:  Not in acute distress.  Moderately built and nourished.  HEENT:  Normocephalic, atraumatic.  Pupils are equally reacting to light and accommodation. No scleral icterus.  No conjunctival injection.  No sinus tenderness.  No postnasal drip.  Moist mucous membranes.  NECK:  Supple.  No JVD.  No thyromegaly.  Range of motion is intact.  LUNGS:  Clear to auscultation bilaterally.  No accessory muscle use and no anterior chest wall tenderness on palpation.  CARDIAC:  S1, S2 normal.  Regular rhythm.  Positive murmur.  GASTROINTESTINAL:  Soft.  Bowel sounds are positive in all four quadrants.  Nontender, nondistended.  Mild lower abdominal tenderness is present, but no rebound tenderness.  NEUROLOGIC:  Awake, alert, oriented x3, following verbal commands.  Motor and sensory are intact.  Reflexes are 2+.  EXTREMITIES:  No cyanosis.  No clubbing.   PSYCHIATRIC:  Normal mood and affect.   LABORATORY DATA AND IMAGING STUDIES:  Accu-Chek 145.  Today's glucose is 150, BUN 28, creatinine normal.  Sodium and potassium are normal.  Calcium is normal.  TSH 2.796. WBC 5.8, hemoglobin 10.9, hematocrit 33.1, platelets are 233,000, PT 14.3, INR 1.1.  Urinalysis: Yellow in color, hazy in appearance, glucose is 50 mg/dL. Repeat urinalysis: RBCs too numerous to count.  The primary care physician is to follow up on the final urine culture result.  The sample was collected on April 29.  The urine culture ordered before was canceled by another.     The plan of care was discussed in detail with the  patient. He verbalized understanding of the plan.   TOTAL TIME SPENT ON DISCHARGE:  40 minutes.     ____________________________ Nicholes Mango, MD [phone] D: 08/16/2014 17:11:00 ET T: 08/16/2014 21:26:22 ET JOB#: [id number]  cc: Sherlynn Stalls, MD Nicholes Mango, MD, <Dictator> Guadalupe Maple, MD      Nicholes Mango MD ELECTRONICALLY SIGNED 08/28/2014 14:22

## 2014-08-30 DIAGNOSIS — R319 Hematuria, unspecified: Secondary | ICD-10-CM | POA: Diagnosis not present

## 2014-08-30 DIAGNOSIS — R3911 Hesitancy of micturition: Secondary | ICD-10-CM | POA: Diagnosis not present

## 2014-08-30 DIAGNOSIS — C61 Malignant neoplasm of prostate: Secondary | ICD-10-CM | POA: Diagnosis not present

## 2014-08-30 DIAGNOSIS — N3090 Cystitis, unspecified without hematuria: Secondary | ICD-10-CM | POA: Diagnosis not present

## 2014-09-12 ENCOUNTER — Other Ambulatory Visit: Payer: Self-pay | Admitting: Urology

## 2014-09-12 DIAGNOSIS — R319 Hematuria, unspecified: Secondary | ICD-10-CM

## 2014-09-19 ENCOUNTER — Ambulatory Visit: Admission: RE | Admit: 2014-09-19 | Payer: Medicare Other | Source: Ambulatory Visit

## 2014-09-23 ENCOUNTER — Other Ambulatory Visit: Payer: Self-pay

## 2014-09-23 DIAGNOSIS — R310 Gross hematuria: Secondary | ICD-10-CM

## 2014-10-02 ENCOUNTER — Telehealth: Payer: Self-pay | Admitting: Urology

## 2014-10-02 ENCOUNTER — Ambulatory Visit
Admission: RE | Admit: 2014-10-02 | Discharge: 2014-10-02 | Disposition: A | Discharge status: Home / Self Care | Payer: Medicare Other | Source: Ambulatory Visit | Attending: Urology | Admitting: Urology

## 2014-10-02 ENCOUNTER — Other Ambulatory Visit: Payer: Self-pay | Admitting: Bariatrics

## 2014-10-02 ENCOUNTER — Encounter: Payer: Self-pay | Admitting: Urology

## 2014-10-02 ENCOUNTER — Ambulatory Visit (INDEPENDENT_AMBULATORY_CARE_PROVIDER_SITE_OTHER): Payer: Medicare Other | Admitting: Urology

## 2014-10-02 DIAGNOSIS — R310 Gross hematuria: Secondary | ICD-10-CM

## 2014-10-02 DIAGNOSIS — Z8546 Personal history of malignant neoplasm of prostate: Secondary | ICD-10-CM | POA: Diagnosis not present

## 2014-10-02 DIAGNOSIS — N3289 Other specified disorders of bladder: Secondary | ICD-10-CM | POA: Diagnosis not present

## 2014-10-02 DIAGNOSIS — N400 Enlarged prostate without lower urinary tract symptoms: Secondary | ICD-10-CM | POA: Insufficient documentation

## 2014-10-02 DIAGNOSIS — Z9689 Presence of other specified functional implants: Secondary | ICD-10-CM | POA: Diagnosis not present

## 2014-10-02 DIAGNOSIS — Z466 Encounter for fitting and adjustment of urinary device: Secondary | ICD-10-CM | POA: Diagnosis not present

## 2014-10-02 DIAGNOSIS — K760 Fatty (change of) liver, not elsewhere classified: Secondary | ICD-10-CM | POA: Insufficient documentation

## 2014-10-02 DIAGNOSIS — R339 Retention of urine, unspecified: Secondary | ICD-10-CM

## 2014-10-02 DIAGNOSIS — N281 Cyst of kidney, acquired: Secondary | ICD-10-CM | POA: Insufficient documentation

## 2014-10-02 HISTORY — DX: Disorder of kidney and ureter, unspecified: N28.9

## 2014-10-02 HISTORY — DX: Unspecified asthma, uncomplicated: J45.909

## 2014-10-02 LAB — BLADDER SCAN AMB NON-IMAGING

## 2014-10-02 MED ORDER — LIDOCAINE HCL 2 % EX GEL
1.0000 "application " | Freq: Once | CUTANEOUS | Status: AC
  Administered 2014-10-02: 1 via URETHRAL

## 2014-10-02 MED ORDER — IOHEXOL 350 MG/ML SOLN
100.0000 mL | Freq: Once | INTRAVENOUS | Status: AC | PRN
  Administered 2014-10-02: 100 mL via INTRAVENOUS

## 2014-10-02 MED ORDER — LIDOCAINE HCL 2 % EX GEL: 1.0000 "application " | Freq: Once | CUTANEOUS | Status: DC

## 2014-10-02 NOTE — Telephone Encounter (Signed)
Duane Hill called saying he's been experiencing blood in his stool. He believes the blood is clotting and said he feels backed up. He's been unable to urinate since 9am last night. He said he's in pain and is wondering if he needs to come in to receive a catheter. Please call the pt. Pt ph# [phone] Thank you.

## 2014-10-02 NOTE — Progress Notes (Signed)
10/02/2014 4:18 PM   [name] 01-Feb-1941 [id number]  Referring provider: Guadalupe Maple, MD [address] [place],  [address]  Chief Complaint  Patient presents with  . Urinary Retention    HPI: 74 year old male with a history of prostate cancer, status post external beam radiation with brachytherapy boost approximately 3 years ago who presented to the emergency room over the course of the week with hematuria and clot retention. He was ultimately admitted to the hospital on 08/14/2014 for this which time urology was consulted. He was placed on CBI for approximately 24 hours and his urine was ultimately able to clear. He was discharged home without a Foley catheter in place. He is treated with Bactrim for presumed UTI although urine culture only grew 20 K of coag-negative staph.  The patient reports that he was previously followed by Dr. Bernardo Heater of [facility] urology but has not seen him in greater than a year.  He was diagnosed with Gleason 4+4 prostate cancer, iPSA 11, T1c in 2013, prostate volume 96 cc He underwent external beam radiation with brachytherapy boost.  He has also not seen Dr. Baruch Gouty in at least that time for PSA monitoring. His most recent documented PSA in our system was 07/2012 which was less than 0.1 ng/dL. He believes thatt Dr. Janae Bridgeman has been following his PSA. We do not have those records today.   He does have a remote history of smoking but quit in the mid 1980s.  He presented to the office today as a walk-in patient with difficulty voiding. He had a small clot earlier today but feels as though he not able to void.  Bladder scan showed greater than 600 cc in his bladder. At this point, straight catheter was attempted but unsuccessful. Zara Council, PA was able to get a 14 [place] coud catheter in return of urine.  He was then sent over to radiology for CT urogram as part of his hematuria workup. At that point, his catheter did clot off and he was  having spasm around the catheter.  Return to the office at which time the catheter was irrigated and a very small clot was cleared. An additional 200 cc was drained from his bladder at this point which is relatively clear. A filll and pull voiding trial was then performed filling his bladder with 240 cc.  He was able to void with less than 100 cc residual in his bladder therefore he was sent home today without a catheter in place.     PMH: Past Medical History  Diagnosis Date  . Hyperlipidemia   . Hypertension   . Radiation proctitis   . GERD (gastroesophageal reflux disease)   . Anemia   . Arthritis   . Dupuytren contracture   . Diabetes mellitus without complication     Metformin  . Asthma     Inhaler as needed  . Cancer     Prostate  . Renal insufficiency     Surgical History: Past Surgical History  Procedure Laterality Date  . Prostate surgery      Home Medications:    Medication List       This list is accurate as of: 10/02/14  4:18 PM.  Always use your most recent med list.               acetaminophen 500 MG tablet  Commonly known as:  TYLENOL  Take 1,000 mg by mouth every 6 (six) hours as needed. Patient uss Caplet  extra strength     albuterol 108 (90 BASE) MCG/ACT inhaler  Commonly known as:  PROVENTIL HFA;VENTOLIN HFA  Inhale 2 puffs into the lungs every 6 (six) hours as needed for wheezing or shortness of breath.     aspirin 81 MG tablet  Take 81 mg by mouth every morning.     ASTEPRO NA  Place 205.5 mcg into the nose 2 (two) times daily. 1 spray nasal as needed for nasal congestion/breathing     carvedilol 25 MG tablet  Commonly known as:  COREG  Take 25 mg by mouth 2 (two) times daily with a meal.     ciprofloxacin 500 MG tablet  Commonly known as:  CIPRO  Take 500 mg by mouth 2 (two) times daily.     gabapentin 100 MG capsule  Commonly known as:  NEURONTIN  Take 200 mg by mouth 2 (two) times daily.     IPRATROPIUM-ALBUTEROL IN  Inhale 90  mcg into the lungs every 4 (four) hours. 2 puffs as needed (uses mainly in summer after moving)     metFORMIN 500 MG tablet  Commonly known as:  GLUCOPHAGE  Take 500 mg by mouth 2 (two) times daily with a meal.     omeprazole 20 MG capsule  Commonly known as:  PRILOSEC  Take 20 mg by mouth daily.     quinapril 20 MG tablet  Commonly known as:  ACCUPRIL  Take 20 mg by mouth daily.     quinapril-hydrochlorothiazide 20-12.5 MG per tablet  Commonly known as:  ACCURETIC  Take 1 tablet by mouth daily.     simvastatin 40 MG tablet  Commonly known as:  ZOCOR  Take 40 mg by mouth daily.     TRIPLEX AD PO  Take 135 mg by mouth every morning.        Allergies: No Known Allergies  Family History: Family History  Problem Relation Age of Onset  . Heart attack Mother   . Hypertension Father   . Stroke Father   . Cancer Father   . Diabetes Father   . Heart attack Father     Social History:  reports that he drinks alcohol. He reports that he does not use illicit drugs. His tobacco history is not on file.   Physical Exam: There were no vitals taken for this visit.  Constitutional:  Alert and oriented, No acute distress. HEENT: [place] AT, moist mucus membranes.  Trachea midline, no masses. Cardiovascular: No clubbing, cyanosis, or edema. Respiratory: Normal respiratory effort, no increased work of breathing. GI: Abdomen is soft, nontender, nondistended, no abdominal masses GU: No CVA tenderness.  Skin: No rashes, bruises or suspicious lesions. Lymph: No cervical or inguinal adenopathy. Neurologic: Grossly intact, no focal deficits, moving all 4 extremities. Psychiatric: Normal mood and affect.  Laboratory Data: Lab Results  Component Value Date   WBC 5.8 08/16/2014   HGB 10.9* 08/16/2014   HCT 33.1* 08/16/2014   MCV 88 08/16/2014   PLT 233 08/16/2014    Lab Results  Component Value Date   CREATININE 1.11 08/16/2014    Lab Results  Component Value Date   PSA <0.1  08/07/2012   PSA <0.1 02/07/2012    Pertinent Imaging: CT Urogram 10/02/14 IMPRESSION: 1. Mild distension of bladder with Foley catheter place (estimated 282 cubic cm). 2. Prostate hypertrophy with brachytherapy seeds. 3. Bosniak 1 renal cyst the right kidney. 4. Hepatic steatosis.  Results for orders placed or performed in visit on 10/02/14  BLADDER  SCAN AMB NON-IMAGING  Result Value Ref Range   Scan Result 600+    Assessment & Plan:  74 year old male with recurrent gross hematuria who presented today with urinary retention/small clots. We were ultimately able to clear his bladder and is able to void with adequate emptying prior to being discharged from clinic today. CT urogram is negative for any upper tract pathology. Plan to perform cystoscopy future date in 1-2 weeks once urine is completely cleared and no recent manipulation.  1. Gross hematuria Status post catheter placement, irrigation, and successful voiding trial today after successfully cleared. Patient advised to return if unable to void or present to the emergency room overnight if he continues to difficulty today. - CT Abdomen Pelvis W Wo Contrast; Future - lidocaine (XYLOCAINE) 2 % jelly 1 application; Place 1 application into the urethra once. - BLADDER SCAN AMB NON-IMAGING -Cystoscopy in 1-2 weeks to complete workup  2. Urinary retention See above  3. History of prostate cancer Previous records reviewed today. Recommended PSA prior to next visit. - PSA; Future  Hollice Espy, MD  [facility] [address] [place], [place] [address] [phone]

## 2014-10-02 NOTE — Telephone Encounter (Signed)
Pt is coming in for a nurse visit.

## 2014-10-02 NOTE — Progress Notes (Signed)
Continuous Intermittent Catheterization  Due to urinary retention patient is present today for a teaching of self I & O Catheterization. Patient was given detailed verbal and printed instructions of self catheterization. Patient was cleaned and prepped in a sterile fashion.  With instruction and assistance patient inserted a 14FR and urine return was noted 0 ml. Patient tolerated well, complications were noted as: catheter was not able to be passed through urethra.  Patient is to follow up with Dr. Erlene Quan tomorrow.  Preformed by: Toniann Fail, LPN

## 2014-10-03 ENCOUNTER — Other Ambulatory Visit: Payer: Medicare Other | Admitting: Urology

## 2014-10-03 ENCOUNTER — Ambulatory Visit: Payer: Medicare Other

## 2014-10-11 ENCOUNTER — Other Ambulatory Visit: Payer: Self-pay | Admitting: Urology

## 2014-10-15 ENCOUNTER — Encounter (INDEPENDENT_AMBULATORY_CARE_PROVIDER_SITE_OTHER): Payer: Self-pay

## 2014-10-15 ENCOUNTER — Ambulatory Visit (INDEPENDENT_AMBULATORY_CARE_PROVIDER_SITE_OTHER): Payer: Medicare Other | Admitting: Urology

## 2014-10-15 ENCOUNTER — Encounter: Payer: Self-pay | Admitting: Urology

## 2014-10-15 VITALS — BP 130/72 | HR 60 | Resp 18 | Ht 68.0 in | Wt 171.2 lb

## 2014-10-15 DIAGNOSIS — Z87448 Personal history of other diseases of urinary system: Secondary | ICD-10-CM

## 2014-10-15 DIAGNOSIS — N329 Bladder disorder, unspecified: Secondary | ICD-10-CM

## 2014-10-15 DIAGNOSIS — R310 Gross hematuria: Secondary | ICD-10-CM | POA: Diagnosis not present

## 2014-10-15 DIAGNOSIS — C61 Malignant neoplasm of prostate: Secondary | ICD-10-CM

## 2014-10-15 DIAGNOSIS — R319 Hematuria, unspecified: Secondary | ICD-10-CM | POA: Diagnosis not present

## 2014-10-15 DIAGNOSIS — Z87898 Personal history of other specified conditions: Secondary | ICD-10-CM

## 2014-10-15 LAB — URINALYSIS, COMPLETE
Bilirubin, UA: NEGATIVE
GLUCOSE, UA: NEGATIVE
KETONES UA: NEGATIVE
LEUKOCYTES UA: NEGATIVE
Nitrite, UA: NEGATIVE
Protein, UA: NEGATIVE
RBC UA: NEGATIVE
Specific Gravity, UA: 1.015 (ref 1.005–1.030)
Urobilinogen, Ur: 1 mg/dL (ref 0.2–1.0)
pH, UA: 7.5 (ref 5.0–7.5)

## 2014-10-15 LAB — MICROSCOPIC EXAMINATION: Bacteria, UA: NONE SEEN

## 2014-10-15 LAB — BLADDER SCAN AMB NON-IMAGING

## 2014-10-15 MED ORDER — LIDOCAINE HCL 2 % EX GEL
1.0000 "application " | Freq: Once | CUTANEOUS | Status: AC
  Administered 2014-10-15: 1 via URETHRAL

## 2014-10-15 MED ORDER — CIPROFLOXACIN HCL 500 MG PO TABS
500.0000 mg | ORAL_TABLET | Freq: Once | ORAL | Status: AC
  Administered 2014-10-15: 500 mg via ORAL

## 2014-10-23 ENCOUNTER — Other Ambulatory Visit: Payer: Self-pay | Admitting: Urology

## 2014-10-23 ENCOUNTER — Encounter: Payer: Self-pay | Admitting: Urology

## 2014-10-23 NOTE — Progress Notes (Signed)
12:21 PM  10/15/14  [place] Hill Feb 23, 1941 [id number]  Referring provider: Guadalupe Maple, MD [address] [place], [place] [address]  Chief Complaint  Patient presents with  . Cysto    Ctscan results    HPI: 74 year old male with a history of prostate cancer, status post external beam radiation with brachytherapy boost approximately 3 years ago who presented to the emergency room over the course of the week with hematuria and clot retention. He was ultimately admitted to the hospital on 08/14/2014 for this which time urology was consulted. He was placed on CBI for approximately 24 hours and his urine was ultimately able to clear. He was discharged home without a Foley catheter in place. He is treated with Bactrim for presumed UTI although urine culture only grew 20 K of coag-negative staph.  The patient reports that he was previously followed by Dr. Bernardo Heater of [facility] urology but has not seen him in greater than a year.  He was diagnosed with Gleason 4+4 prostate cancer, iPSA 11, T1c in 2013, prostate volume 96 cc He underwent external beam radiation with brachytherapy boost.  He has also not seen Dr. Baruch Gouty in at least that time for PSA monitoring. His most recent documented PSA in our system was 07/2012 which was less than 0.1 ng/dL. He believes thatt Dr. Janae Bridgeman has been following his PSA. We do not have those records today.   He does have a remote history of smoking but quit in the mid 1980s.  CT Urogram from 10/02/14 shows enlarged prostate with brachytherapy seeds, otherwise no obvious GU pathology.       PMH: Past Medical History  Diagnosis Date  . Hyperlipidemia   . Hypertension   . Radiation proctitis   . GERD (gastroesophageal reflux disease)   . Anemia   . Arthritis   . Dupuytren contracture   . Diabetes mellitus without complication     Metformin  . Asthma     Inhaler as needed  . Cancer     Prostate  . Renal insufficiency     Surgical  History: Past Surgical History  Procedure Laterality Date  . Prostate surgery      Home Medications:    Medication List       This list is accurate as of: 10/15/14 11:59 PM.  Always use your most recent med list.               acetaminophen 500 MG tablet  Commonly known as:  TYLENOL  Take 1,000 mg by mouth every 6 (six) hours as needed. Patient uss Caplet extra strength     albuterol 108 (90 BASE) MCG/ACT inhaler  Commonly known as:  PROVENTIL HFA;VENTOLIN HFA  Inhale 2 puffs into the lungs every 6 (six) hours as needed for wheezing or shortness of breath.     aspirin 81 MG tablet  Take 81 mg by mouth every morning.     ASTEPRO NA  Place 205.5 mcg into the nose 2 (two) times daily. 1 spray nasal as needed for nasal congestion/breathing     carvedilol 25 MG tablet  Commonly known as:  COREG  Take 25 mg by mouth 2 (two) times daily with a meal.     ciprofloxacin 500 MG tablet  Commonly known as:  CIPRO  Take 500 mg by mouth 2 (two) times daily.     famciclovir 500 MG tablet  Commonly known as:  FAMVIR     Fenofibric Acid 135 MG  Cpdr     finasteride 5 MG tablet  Commonly known as:  PROSCAR  Take 5 mg by mouth.     gabapentin 100 MG capsule  Commonly known as:  NEURONTIN  Take 200 mg by mouth 2 (two) times daily.     hydrochlorothiazide 12.5 MG capsule  Commonly known as:  MICROZIDE     IPRATROPIUM-ALBUTEROL IN  Inhale 90 mcg into the lungs every 4 (four) hours. 2 puffs as needed (uses mainly in summer after moving)     metFORMIN 500 MG tablet  Commonly known as:  GLUCOPHAGE  Take 500 mg by mouth 2 (two) times daily with a meal.     omeprazole 20 MG capsule  Commonly known as:  PRILOSEC  Take 20 mg by mouth daily.     oxyCODONE-acetaminophen 5-325 MG per tablet  Commonly known as:  PERCOCET/ROXICET     pantoprazole 40 MG tablet  Commonly known as:  PROTONIX     quinapril 20 MG tablet  Commonly known as:  ACCUPRIL  Take 20 mg by mouth daily.      quinapril-hydrochlorothiazide 20-12.5 MG per tablet  Commonly known as:  ACCURETIC  Take 1 tablet by mouth daily.     simvastatin 40 MG tablet  Commonly known as:  ZOCOR  Take 40 mg by mouth daily.     sulfamethoxazole-trimethoprim 800-160 MG per tablet  Commonly known as:  BACTRIM DS,SEPTRA DS     tamsulosin 0.4 MG Caps capsule  Commonly known as:  FLOMAX     TRIPLEX AD PO  Take 135 mg by mouth every morning.        Allergies: No Known Allergies  Family History: Family History  Problem Relation Age of Onset  . Heart attack Mother   . Hypertension Father   . Stroke Father   . Cancer Father   . Diabetes Father   . Heart attack Father     Social History:  reports that he quit smoking about 36 years ago. His smoking use included Cigarettes. He does not have any smokeless tobacco history on file. He reports that he drinks alcohol. He reports that he does not use illicit drugs.   Physical Exam: BP 130/72 mmHg  Pulse 60  Resp 18  Ht 5\' 8"  (1.727 m)  Wt 171 lb 3.2 oz (77.656 kg)  BMI 26.04 kg/m2  Constitutional:  Alert and oriented, No acute distress. HEENT: [place] AT, moist mucus membranes.  Trachea midline, no masses. Cardiovascular: No clubbing, cyanosis, or edema. Respiratory: Normal respiratory effort, no increased work of breathing. GI: Abdomen is soft, nontender, nondistended, no abdominal masses GU: No CVA tenderness.  Normal phallus, urethral meatus.   Skin: No rashes, bruises or suspicious lesions.. Neurologic: Grossly intact, no focal deficits, moving all 4 extremities. Psychiatric: Normal mood and affect.  Laboratory Data: Lab Results  Component Value Date   WBC 5.8 08/16/2014   HGB 10.9* 08/16/2014   HCT 33.1* 08/16/2014   MCV 88 08/16/2014   PLT 233 08/16/2014    Lab Results  Component Value Date   CREATININE 1.11 08/16/2014    Lab Results  Component Value Date   PSA <0.1 08/07/2012   PSA <0.1 02/07/2012    Pertinent Imaging: CT Urogram  10/02/14 IMPRESSION: 1. Mild distension of bladder with Foley catheter place (estimated 282 cubic cm). 2. Prostate hypertrophy with brachytherapy seeds. 3. Bosniak 1 renal cyst the right kidney. 4. Hepatic steatosis.   Results for orders placed or performed in visit on  10/15/14  Microscopic Examination  Result Value Ref Range   WBC, UA 0-5 0 -  5 /hpf   RBC, UA 0-2 0 -  2 /hpf   Epithelial Cells (non renal) 0-10 0 - 10 /hpf   Bacteria, UA None seen None seen/Few  Urinalysis, Complete  Result Value Ref Range   Specific Gravity, UA 1.015 1.005 - 1.030   pH, UA 7.5 5.0 - 7.5   Color, UA Yellow Yellow   Appearance Ur Clear Clear   Leukocytes, UA Negative Negative   Protein, UA Negative Negative/Trace   Glucose, UA Negative Negative   Ketones, UA Negative Negative   RBC, UA Negative Negative   Bilirubin, UA Negative Negative   Urobilinogen, Ur 1.0 0.2 - 1.0 mg/dL   Nitrite, UA Negative Negative   Microscopic Examination See below:   BLADDER SCAN AMB NON-IMAGING  Result Value Ref Range   Scan Result 31ml     Cystoscopy Procedure Note  Patient identification was confirmed, informed consent was obtained, and patient was prepped using Betadine solution.  Lidocaine jelly was administered per urethral meatus.    Preoperative abx where received prior to procedure.     Pre-Procedure: - Inspection reveals a normal caliber ureteral meatus.  Procedure: The flexible cystoscope was introduced without difficulty - No urethral strictures/lesions are present. - Enlarged prostate  - Elevated bladder neck (mildy) - Bilateral ureteral orifices identified - Bladder mucosa  reveals obvious no ulcers, tumors, or lesions - No bladder stones - Mild trabeculation  Retroflexion shows small questionable papillary lesion on the right bladder neck (<0.5), unclear if edema vs. Early tumor  Post-Procedure: - Patient tolerated the procedure well   Assessment & Plan:  74 year old male with  recurrent gross hematuria, history of prostate cancer s/p      1. Gross hematuria S/p CT Urogram 10/02/14 negative and cystoscopy today.  Questionable lesion at bladder neck favoring a benign appearance.  Findings were reviewed with the patient today. We will send off a urine cytology. We discussed today that if the cytology is negative, I would like to reassess the lesion in 3 months.  If cytology positive or suspicious, plan to proceed with biopsy in OR.   patient is agreeable with this plan. Call and let him know if the cytology is concerning.  - Urinalysis, Complete - lidocaine (XYLOCAINE) 2 % jelly 1 application; Place 1 application into the urethra once. - ciprofloxacin (CIPRO) tablet 500 mg; Take 1 tablet (500 mg total) by mouth once. - Microscopic Examination  2. H/O urinary retention Voiding without difficulty since last visit. PVR today less than 100.  Retention previously related to hematuria/clot. - BLADDER SCAN AMB NON-IMAGING  3. CA of prostate Dx Gleason 4+4 prostate cancer, iPSA 11, T1c in 2013, prostate volume 96 cc He underwent external beam radiation with brachytherapy boost.  PSA presumably being followed by PCP. -PSA prior to any GU manipulation at next visit  (defer today given Foley/ manipulation last week)  4. Lesion of bladder See above   Return in about 3 months (around 01/15/2015) for cystoscopy.  Hollice Espy, MD  [facility] [address] [place], [place] [address] [phone]

## 2014-11-05 ENCOUNTER — Ambulatory Visit (INDEPENDENT_AMBULATORY_CARE_PROVIDER_SITE_OTHER): Payer: Medicare Other | Admitting: Family Medicine

## 2014-11-05 ENCOUNTER — Encounter: Payer: Self-pay | Admitting: Family Medicine

## 2014-11-05 VITALS — BP 121/63 | HR 53 | Temp 97.8°F | Ht 66.4 in | Wt 171.0 lb

## 2014-11-05 DIAGNOSIS — E119 Type 2 diabetes mellitus without complications: Secondary | ICD-10-CM

## 2014-11-05 DIAGNOSIS — D509 Iron deficiency anemia, unspecified: Secondary | ICD-10-CM | POA: Diagnosis not present

## 2014-11-05 DIAGNOSIS — K627 Radiation proctitis: Secondary | ICD-10-CM

## 2014-11-05 DIAGNOSIS — C61 Malignant neoplasm of prostate: Secondary | ICD-10-CM | POA: Diagnosis not present

## 2014-11-05 DIAGNOSIS — K219 Gastro-esophageal reflux disease without esophagitis: Secondary | ICD-10-CM | POA: Diagnosis not present

## 2014-11-05 DIAGNOSIS — E785 Hyperlipidemia, unspecified: Secondary | ICD-10-CM

## 2014-11-05 DIAGNOSIS — I10 Essential (primary) hypertension: Secondary | ICD-10-CM | POA: Diagnosis not present

## 2014-11-05 DIAGNOSIS — N3040 Irradiation cystitis without hematuria: Secondary | ICD-10-CM | POA: Diagnosis not present

## 2014-11-05 DIAGNOSIS — Z0000 Encounter for general adult medical examination without abnormal findings: Secondary | ICD-10-CM

## 2014-11-05 DIAGNOSIS — E1169 Type 2 diabetes mellitus with other specified complication: Secondary | ICD-10-CM | POA: Insufficient documentation

## 2014-11-05 LAB — MICROSCOPIC EXAMINATION

## 2014-11-05 LAB — URINALYSIS, ROUTINE W REFLEX MICROSCOPIC
BILIRUBIN UA: NEGATIVE
Glucose, UA: NEGATIVE
Ketones, UA: NEGATIVE
LEUKOCYTES UA: NEGATIVE
Nitrite, UA: NEGATIVE
Protein, UA: NEGATIVE
SPEC GRAV UA: 1.015 (ref 1.005–1.030)
UUROB: 0.2 mg/dL (ref 0.2–1.0)
pH, UA: 7 (ref 5.0–7.5)

## 2014-11-05 LAB — BAYER DCA HB A1C WAIVED: HB A1C: 6.9 % (ref <7.0)

## 2014-11-05 MED ORDER — CARVEDILOL 25 MG PO TABS: 25.0000 mg | ORAL_TABLET | Freq: Two times a day (BID) | ORAL | Status: DC

## 2014-11-05 MED ORDER — HYDROCHLOROTHIAZIDE 12.5 MG PO CAPS: 12.5000 mg | ORAL_CAPSULE | Freq: Every day | ORAL | Status: DC

## 2014-11-05 MED ORDER — SIMVASTATIN 40 MG PO TABS: 40.0000 mg | ORAL_TABLET | Freq: Every day | ORAL | Status: DC

## 2014-11-05 MED ORDER — QUINAPRIL HCL 40 MG PO TABS: 40.0000 mg | ORAL_TABLET | Freq: Every day | ORAL | Status: DC

## 2014-11-05 MED ORDER — FENOFIBRIC ACID 135 MG PO CPDR
135.0000 mg | DELAYED_RELEASE_CAPSULE | Freq: Every day | ORAL | Status: DC
Start: 2014-11-05 — End: 2015-11-17

## 2014-11-05 MED ORDER — GABAPENTIN 300 MG PO CAPS: 300.0000 mg | ORAL_CAPSULE | Freq: Two times a day (BID) | ORAL | Status: DC

## 2014-11-05 MED ORDER — METFORMIN HCL 500 MG PO TABS: 500.0000 mg | ORAL_TABLET | Freq: Two times a day (BID) | ORAL | Status: DC

## 2014-11-05 MED ORDER — TAMSULOSIN HCL 0.4 MG PO CAPS: 0.4000 mg | ORAL_CAPSULE | Freq: Every day | ORAL | Status: DC

## 2014-11-05 MED ORDER — PANTOPRAZOLE SODIUM 40 MG PO TBEC: 40.0000 mg | DELAYED_RELEASE_TABLET | Freq: Every day | ORAL | Status: DC

## 2014-11-05 NOTE — Assessment & Plan Note (Signed)
We'll stay off aspirin due to bleeding risk and history of bleeding

## 2014-11-05 NOTE — Progress Notes (Signed)
BP 121/63 mmHg  Pulse 53  Temp(Src) 97.8 F (36.6 C)  Ht 5' 6.4" (1.687 m)  Wt 171 lb (77.565 kg)  BMI 27.25 kg/m2  SpO2 97%   Subjective:    Patient ID: Duane Edelman., male    DOB: Jul 06, 1940, 74 y.o.   MRN: [id number]  HPI: Duane Coryell. is a 74 y.o. male  Chief Complaint  Patient presents with  . Annual Exam   patient out of the hospital for 2 days with diagnosis of radiation cystitis bleeding has resolved has remained off aspirin and was also taken off of simvastatin. Patient with no further symptoms and doing well. Diabetes has been doing well no hyperglycemia on glucose checks in the low 100s. Reflux allergies stable as long as can control allergies uses over-the-counter medications which help. Blood pressures doing well new issues or side effects with medications and good control going on long-term Patient has been off simvastatin so unknown control of cholesterol For patient's anemia from his bleeding bladder took iron until just the last few weeks we'll be checking CBC today.  Relevant past medical, surgical, family and social history reviewed and updated as indicated. Interim medical history since our last visit reviewed. Allergies and medications reviewed and updated.  Review of Systems  Constitutional: Negative.   HENT: Negative.   Eyes: Negative.   Respiratory: Negative.   Cardiovascular: Negative.   Endocrine: Negative.   Musculoskeletal: Negative.   Skin: Negative.   Allergic/Immunologic: Negative.   Neurological: Negative.   Hematological: Negative.   Psychiatric/Behavioral: Negative.     Per HPI unless specifically indicated above     Objective:    BP 121/63 mmHg  Pulse 53  Temp(Src) 97.8 F (36.6 C)  Ht 5' 6.4" (1.687 m)  Wt 171 lb (77.565 kg)  BMI 27.25 kg/m2  SpO2 97%  Wt Readings from Last 3 Encounters:  11/05/14 171 lb (77.565 kg)  10/15/14 171 lb 3.2 oz (77.656 kg)  08/19/14 165 lb (74.844 kg)    Physical Exam   Constitutional: He is oriented to person, place, and time. He appears well-developed and well-nourished.  HENT:  Head: Normocephalic and atraumatic.  Right Ear: External ear normal.  Left Ear: External ear normal.  Eyes: Conjunctivae and EOM are normal. Pupils are equal, round, and reactive to light.  Neck: Normal range of motion. Neck supple.  Cardiovascular: Normal rate, regular rhythm, normal heart sounds and intact distal pulses.   Pulmonary/Chest: Effort normal and breath sounds normal.  Abdominal: Soft. Bowel sounds are normal. There is no splenomegaly or hepatomegaly.  Genitourinary: Rectum normal, prostate normal and penis normal.  Musculoskeletal: Normal range of motion.  Neurological: He is alert and oriented to person, place, and time. He has normal reflexes.  Skin: No rash noted. No erythema.  Psychiatric: He has a normal mood and affect. His behavior is normal. Judgment and thought content normal.    Results for orders placed or performed in visit on 10/15/14  Microscopic Examination  Result Value Ref Range   WBC, UA 0-5 0 -  5 /hpf   RBC, UA 0-2 0 -  2 /hpf   Epithelial Cells (non renal) 0-10 0 - 10 /hpf   Bacteria, UA None seen None seen/Few  Urinalysis, Complete  Result Value Ref Range   Specific Gravity, UA 1.015 1.005 - 1.030   pH, UA 7.5 5.0 - 7.5   Color, UA Yellow Yellow   Appearance Ur Clear Clear   Leukocytes, UA  Negative Negative   Protein, UA Negative Negative/Trace   Glucose, UA Negative Negative   Ketones, UA Negative Negative   RBC, UA Negative Negative   Bilirubin, UA Negative Negative   Urobilinogen, Ur 1.0 0.2 - 1.0 mg/dL   Nitrite, UA Negative Negative   Microscopic Examination See below:   BLADDER SCAN AMB NON-IMAGING  Result Value Ref Range   Scan Result 55ml       Assessment & Plan:   Problem List Items Addressed This Visit      Cardiovascular and Mediastinum   Hypertension - Primary    The current medical regimen is effective;   continue present plan and medications.       Relevant Medications   simvastatin (ZOCOR) 40 MG tablet   quinapril (ACCUPRIL) 40 MG tablet   hydrochlorothiazide (MICROZIDE) 12.5 MG capsule   Choline Fenofibrate (FENOFIBRIC ACID) 135 MG CPDR   carvedilol (COREG) 25 MG tablet   Other Relevant Orders   Bayer DCA Hb A1c Waived   Comprehensive metabolic panel   CBC with Differential/Platelet   Urinalysis, Routine w reflex microscopic (not at [facility])   TSH   Lipid panel     Digestive   Radiation proctitis    Doing well with no symptoms as long as he takes gabapentin      Relevant Orders   Bayer DCA Hb A1c Waived   Comprehensive metabolic panel   CBC with Differential/Platelet   Urinalysis, Routine w reflex microscopic (not at [facility])   TSH   Lipid panel   PSA   GERD (gastroesophageal reflux disease)    The current medical regimen is effective;  continue present plan and medications.       Relevant Medications   pantoprazole (PROTONIX) 40 MG tablet   Other Relevant Orders   Bayer DCA Hb A1c Waived   Comprehensive metabolic panel   CBC with Differential/Platelet   Urinalysis, Routine w reflex microscopic (not at [facility])   TSH   Lipid panel     Endocrine   Diabetes    The current medical regimen is effective;  continue present plan and medications.       Relevant Medications   simvastatin (ZOCOR) 40 MG tablet   quinapril (ACCUPRIL) 40 MG tablet   metFORMIN (GLUCOPHAGE) 500 MG tablet   Other Relevant Orders   Hemoglobin A1c     Genitourinary   CA of prostate    Stable after radiation      Relevant Medications   gabapentin (NEURONTIN) 300 MG capsule   Other Relevant Orders   PSA   Chronic radiation cystitis    We'll stay off aspirin due to bleeding risk and history of bleeding      Relevant Orders   Bayer DCA Hb A1c Waived   Comprehensive metabolic panel   CBC with Differential/Platelet   Urinalysis, Routine w reflex microscopic (not at [facility])   TSH   Lipid  panel     Other   Hyperlipidemia    Anticipating lipids up as been off medication for over a month will restart after labs return simvastatin      Relevant Medications   simvastatin (ZOCOR) 40 MG tablet   quinapril (ACCUPRIL) 40 MG tablet   hydrochlorothiazide (MICROZIDE) 12.5 MG capsule   Choline Fenofibrate (FENOFIBRIC ACID) 135 MG CPDR   carvedilol (COREG) 25 MG tablet   Other Relevant Orders   Bayer DCA Hb A1c Waived   Comprehensive metabolic panel   CBC with Differential/Platelet   Urinalysis, Routine  w reflex microscopic (not at [facility])   TSH   Lipid panel   Iron (Fe) deficiency anemia    We'll check CBC today to see if resolving      Relevant Orders   Bayer DCA Hb A1c Waived   Comprehensive metabolic panel   CBC with Differential/Platelet   Urinalysis, Routine w reflex microscopic (not at [facility])   TSH   Lipid panel    Other Visit Diagnoses    Annual physical exam        Relevant Orders    Bayer [place] Hb A1c Waived    Comprehensive metabolic panel    CBC with Differential/Platelet    Urinalysis, Routine w reflex microscopic (not at [facility])    TSH    Lipid panel    PSA        Follow up plan: Return in about 3 months (around 02/05/2015), or if symptoms worsen or fail to improve.

## 2014-11-05 NOTE — Assessment & Plan Note (Signed)
Stable after radiation

## 2014-11-05 NOTE — Assessment & Plan Note (Signed)
The current medical regimen is effective;  continue present plan and medications.  

## 2014-11-05 NOTE — Assessment & Plan Note (Signed)
Doing well with no symptoms as long as he takes gabapentin

## 2014-11-05 NOTE — Assessment & Plan Note (Signed)
Anticipating lipids up as been off medication for over a month will restart after labs return simvastatin

## 2014-11-05 NOTE — Assessment & Plan Note (Signed)
We'll check CBC today to see if resolving

## 2014-11-06 LAB — LIPID PANEL
CHOL/HDL RATIO: 7.6 ratio — AB (ref 0.0–5.0)
CHOLESTEROL TOTAL: 189 mg/dL (ref 100–199)
HDL: 25 mg/dL — ABNORMAL LOW (ref >39)
LDL CALC: 104 mg/dL — AB (ref 0–99)
Triglycerides: 298 mg/dL — ABNORMAL HIGH (ref 0–149)
VLDL Cholesterol Cal: 60 mg/dL — ABNORMAL HIGH (ref 5–40)

## 2014-11-06 LAB — COMPREHENSIVE METABOLIC PANEL
ALT: 21 IU/L (ref 0–44)
AST: 17 IU/L (ref 0–40)
Albumin/Globulin Ratio: 2.1 (ref 1.1–2.5)
Albumin: 4.1 g/dL (ref 3.5–4.8)
Alkaline Phosphatase: 32 IU/L — ABNORMAL LOW (ref 39–117)
BILIRUBIN TOTAL: 0.2 mg/dL (ref 0.0–1.2)
BUN / CREAT RATIO: 14 (ref 10–22)
BUN: 17 mg/dL (ref 8–27)
CALCIUM: 9 mg/dL (ref 8.6–10.2)
CHLORIDE: 99 mmol/L (ref 97–108)
CO2: 24 mmol/L (ref 18–29)
Creatinine, Ser: 1.25 mg/dL (ref 0.76–1.27)
GFR calc non Af Amer: 58 mL/min/{1.73_m2} — ABNORMAL LOW (ref >59)
GFR, EST AFRICAN AMERICAN: 67 mL/min/{1.73_m2} (ref >59)
GLOBULIN, TOTAL: 2 g/dL (ref 1.5–4.5)
Glucose: 115 mg/dL — ABNORMAL HIGH (ref 65–99)
Potassium: 4.4 mmol/L (ref 3.5–5.2)
SODIUM: 138 mmol/L (ref 134–144)
Total Protein: 6.1 g/dL (ref 6.0–8.5)

## 2014-11-06 LAB — CBC WITH DIFFERENTIAL/PLATELET
Basophils Absolute: 0 10*3/uL (ref 0.0–0.2)
Basos: 1 %
EOS (ABSOLUTE): 0.2 10*3/uL (ref 0.0–0.4)
Eos: 3 %
Hematocrit: 38.3 % (ref 37.5–51.0)
Hemoglobin: 12.3 g/dL — ABNORMAL LOW (ref 12.6–17.7)
IMMATURE GRANULOCYTES: 0 %
Immature Grans (Abs): 0 10*3/uL (ref 0.0–0.1)
Lymphocytes Absolute: 1.4 10*3/uL (ref 0.7–3.1)
Lymphs: 28 %
MCH: 28.5 pg (ref 26.6–33.0)
MCHC: 32.1 g/dL (ref 31.5–35.7)
MCV: 89 fL (ref 79–97)
Monocytes Absolute: 0.4 10*3/uL (ref 0.1–0.9)
Monocytes: 8 %
Neutrophils Absolute: 3.1 10*3/uL (ref 1.4–7.0)
Neutrophils: 60 %
Platelets: 232 10*3/uL (ref 150–379)
RBC: 4.32 x10E6/uL (ref 4.14–5.80)
RDW: 14.5 % (ref 12.3–15.4)
WBC: 5.1 10*3/uL (ref 3.4–10.8)

## 2014-11-06 LAB — TSH: TSH: 2.35 u[IU]/mL (ref 0.450–4.500)

## 2014-11-06 LAB — PSA: Prostate Specific Ag, Serum: <0.1 ng/mL (ref 0.0–4.0)

## 2014-12-12 ENCOUNTER — Telehealth: Payer: Self-pay

## 2014-12-12 MED ORDER — AZELASTINE HCL 0.1 % NA SOLN: 2.0000 | Freq: Two times a day (BID) | NASAL | Status: DC

## 2014-12-12 NOTE — Telephone Encounter (Signed)
Azelastine 1.15% nasal spray [place] [address]

## 2015-01-17 ENCOUNTER — Other Ambulatory Visit: Payer: Medicare Other | Admitting: Urology

## 2015-01-17 ENCOUNTER — Telehealth: Payer: Self-pay

## 2015-01-17 ENCOUNTER — Other Ambulatory Visit: Payer: Medicare Other

## 2015-01-17 NOTE — Telephone Encounter (Deleted)
Dr. Jeananne Rama,  A fax came in for Mr. Duane Hill's nasal spray at 0.15%. You refilled this medication at .01%. The pharmacy wants to know if you meant to refill this medication at 0.1% or should it be 0.15%. The pharmacist explained to me that Duane Hill has been taking the 0.15% for a while. I looked for an encounter, and he hasn't been seen here since we switched to epic and I don't have a way to practice partner. Respond when you get time! Thank you!

## 2015-01-20 MED ORDER — AZELASTINE HCL 0.15 % NA SOLN: 2.0000 | Freq: Every morning | NASAL | Status: DC

## 2015-01-20 NOTE — Addendum Note (Signed)
Addended byGolden Pop on: 01/20/2015 08:55 AM   Modules accepted: Orders, Medications

## 2015-02-05 ENCOUNTER — Encounter: Payer: Self-pay | Admitting: Family Medicine

## 2015-02-05 ENCOUNTER — Ambulatory Visit (INDEPENDENT_AMBULATORY_CARE_PROVIDER_SITE_OTHER): Payer: Medicare Other | Admitting: Family Medicine

## 2015-02-05 VITALS — BP 122/60 | HR 72 | Temp 98.0°F | Resp 16 | Ht 67.0 in | Wt 171.0 lb

## 2015-02-05 DIAGNOSIS — M7551 Bursitis of right shoulder: Secondary | ICD-10-CM | POA: Diagnosis not present

## 2015-02-05 DIAGNOSIS — K627 Radiation proctitis: Secondary | ICD-10-CM

## 2015-02-05 DIAGNOSIS — I10 Essential (primary) hypertension: Secondary | ICD-10-CM

## 2015-02-05 DIAGNOSIS — Z23 Encounter for immunization: Secondary | ICD-10-CM | POA: Diagnosis not present

## 2015-02-05 DIAGNOSIS — E119 Type 2 diabetes mellitus without complications: Secondary | ICD-10-CM

## 2015-02-05 LAB — MICROALBUMIN, URINE WAIVED
CREATININE, URINE WAIVED: 300 mg/dL (ref 10–300)
Microalb, Ur Waived: 150 mg/L — ABNORMAL HIGH (ref 0–19)

## 2015-02-05 LAB — BAYER DCA HB A1C WAIVED: HB A1C (BAYER DCA - WAIVED): 6.9 % (ref <7.0)

## 2015-02-05 NOTE — Assessment & Plan Note (Signed)
An intermittent slight hematuria concerned may be coming from and aggravated by simvastatin will hold simvastatin and observe has follow-up appointment with urology

## 2015-02-05 NOTE — Progress Notes (Signed)
BP 122/60 mmHg  Pulse 72  Temp(Src) 98 F (36.7 C)  Resp 16  Ht 5\' 7"  (1.702 m)  Wt 171 lb (77.565 kg)  BMI 26.78 kg/m2   Subjective:    Patient ID: Duane Edelman., male    DOB: 05/30/1940, 74 y.o.   MRN: [id number]  HPI: Duane Lauf. is a 74 y.o. male  Chief Complaint  Patient presents with  . Diabetes  . Hyperlipidemia    is doing well noted low blood sugar spells glucose doing good with no side effects from any medications Taking medications without problems 1 patient's biggest concerns today is his right shoulder which is been ongoing all summer no known specific trauma or in the deltoid insertion area wakes him at night. Patient had some leftover Percocet from hospitalization in April and has taken a couple of those which is helped. He's done that to keep taking as much Tylenol as he has been.  Agent also with started hematuria again. This is previously diagnoses from radiation burns from prostate cancer care. He shouldn't had spots of blood coming out with small clots patient noticed some association with simvastatin starting and stopping suggest stopped again this week. Has not had hematuria the last several days. Patient has appointment with urology in December. h  Relevant past medical, surgical, family and social history reviewed and updated as indicated. Interim medical history since our last visit reviewed. Allergies and medications reviewed and updated.  Review of Systems  Constitutional: Negative.   Respiratory: Negative.   Cardiovascular: Negative.     Per HPI unless specifically indicated above     Objective:    BP 122/60 mmHg  Pulse 72  Temp(Src) 98 F (36.7 C)  Resp 16  Ht 5\' 7"  (1.702 m)  Wt 171 lb (77.565 kg)  BMI 26.78 kg/m2  Wt Readings from Last 3 Encounters:  02/05/15 171 lb (77.565 kg)  11/05/14 171 lb (77.565 kg)  10/15/14 171 lb 3.2 oz (77.656 kg)    Physical Exam  Constitutional: He is oriented to person, place, and  time. He appears well-developed and well-nourished. No distress.  HENT:  Head: Normocephalic and atraumatic.  Right Ear: Hearing normal.  Left Ear: Hearing normal.  Nose: Nose normal.  Eyes: Conjunctivae and lids are normal. Right eye exhibits no discharge. Left eye exhibits no discharge. No scleral icterus.  Cardiovascular: Normal rate, regular rhythm and normal heart sounds.   Pulmonary/Chest: Effort normal and breath sounds normal. No respiratory distress.  Musculoskeletal: Normal range of motion.  Right shoulder full range of motion no pain or pain at deltoid insertion and worse with resistance to use of deltoid muscle.  Neurological: He is alert and oriented to person, place, and time.  Skin: Skin is intact. No rash noted.  Psychiatric: He has a normal mood and affect. His speech is normal and behavior is normal. Judgment and thought content normal. Cognition and memory are normal.    Results for orders placed or performed in visit on 11/05/14  Microscopic Examination  Result Value Ref Range   WBC, UA 0-5 0 -  5 /hpf   RBC, UA 0-2 0 -  2 /hpf   Epithelial Cells (non renal) 0-10 0 - 10 /hpf   Bacteria, UA Few None seen/Few  Bayer DCA Hb A1c Waived  Result Value Ref Range   Bayer DCA Hb A1c Waived 6.9 <7.0 %  Comprehensive metabolic panel  Result Value Ref Range   Glucose 115 (H)  65 - 99 mg/dL   BUN 17 8 - 27 mg/dL   Creatinine, Ser 1.25 0.76 - 1.27 mg/dL   GFR calc non Af Amer 58 (L) >59 mL/min/1.73   GFR calc Af Amer 67 >59 mL/min/1.73   BUN/Creatinine Ratio 14 10 - 22   Sodium 138 134 - 144 mmol/L   Potassium 4.4 3.5 - 5.2 mmol/L   Chloride 99 97 - 108 mmol/L   CO2 24 18 - 29 mmol/L   Calcium 9.0 8.6 - 10.2 mg/dL   Total Protein 6.1 6.0 - 8.5 g/dL   Albumin 4.1 3.5 - 4.8 g/dL   Globulin, Total 2.0 1.5 - 4.5 g/dL   Albumin/Globulin Ratio 2.1 1.1 - 2.5   Bilirubin Total 0.2 0.0 - 1.2 mg/dL   Alkaline Phosphatase 32 (L) 39 - 117 IU/L   AST 17 0 - 40 IU/L   ALT 21 0 -  44 IU/L  CBC with Differential/Platelet  Result Value Ref Range   WBC 5.1 3.4 - 10.8 x10E3/uL   RBC 4.32 4.14 - 5.80 x10E6/uL   Hemoglobin 12.3 (L) 12.6 - 17.7 g/dL   Hematocrit 38.3 37.5 - 51.0 %   MCV 89 79 - 97 fL   MCH 28.5 26.6 - 33.0 pg   MCHC 32.1 31.5 - 35.7 g/dL   RDW 14.5 12.3 - 15.4 %   Platelets 232 150 - 379 x10E3/uL   Neutrophils 60 %   Lymphs 28 %   Monocytes 8 %   Eos 3 %   Basos 1 %   Neutrophils Absolute 3.1 1.4 - 7.0 x10E3/uL   Lymphocytes Absolute 1.4 0.7 - 3.1 x10E3/uL   Monocytes Absolute 0.4 0.1 - 0.9 x10E3/uL   EOS (ABSOLUTE) 0.2 0.0 - 0.4 x10E3/uL   Basophils Absolute 0.0 0.0 - 0.2 x10E3/uL   Immature Granulocytes 0 %   Immature Grans (Abs) 0.0 0.0 - 0.1 x10E3/uL  Urinalysis, Routine w reflex microscopic (not at [facility])  Result Value Ref Range   Specific Gravity, UA 1.015 1.005 - 1.030   pH, UA 7.0 5.0 - 7.5   Color, UA Yellow Yellow   Appearance Ur Clear Clear   Leukocytes, UA Negative Negative   Protein, UA Negative Negative/Trace   Glucose, UA Negative Negative   Ketones, UA Negative Negative   RBC, UA Trace (A) Negative   Bilirubin, UA Negative Negative   Urobilinogen, Ur 0.2 0.2 - 1.0 mg/dL   Nitrite, UA Negative Negative   Microscopic Examination See below:   TSH  Result Value Ref Range   TSH 2.350 0.450 - 4.500 uIU/mL  Lipid panel  Result Value Ref Range   Cholesterol, Total 189 100 - 199 mg/dL   Triglycerides 298 (H) 0 - 149 mg/dL   HDL 25 (L) >39 mg/dL   VLDL Cholesterol Cal 60 (H) 5 - 40 mg/dL   LDL Calculated 104 (H) 0 - 99 mg/dL   Chol/HDL Ratio 7.6 (H) 0.0 - 5.0 ratio units  PSA  Result Value Ref Range   Prostate Specific Ag, Serum <0.1 0.0 - 4.0 ng/mL      Assessment & Plan:   Problem List Items Addressed This Visit      Cardiovascular and Mediastinum   Hypertension    The current medical regimen is effective;  continue present plan and medications.         Digestive   Radiation proctitis    An intermittent  slight hematuria concerned may be coming from and aggravated by simvastatin will  hold simvastatin and observe has follow-up appointment with urology        Endocrine   Diabetes [facility])    The current medical regimen is effective;  continue present plan and medications.        Other Visit Diagnoses    Diabetes mellitus without complication ([place])    -  Primary    Relevant Orders    Bayer DCA Hb A1c Waived    Microalbumin, Urine Waived    Immunization due        Relevant Orders    Flu Vaccine QUAD 36+ mos PF IM (Fluarix & Fluzone Quad PF) (Completed)    Deltoid bursitis, right        Discussed care and treatment use of Tylenol gentle massage and rotation Because of bladder bleeding will avoid anti-inflammatory agents        Follow up plan: Return in about 3 months (around 05/08/2015), or if symptoms worsen or fail to improve, for A1c, BMP, lipids, alt, ast.

## 2015-02-05 NOTE — Assessment & Plan Note (Signed)
The current medical regimen is effective;  continue present plan and medications.  

## 2015-03-21 ENCOUNTER — Ambulatory Visit (INDEPENDENT_AMBULATORY_CARE_PROVIDER_SITE_OTHER): Payer: Medicare Other | Admitting: Urology

## 2015-03-21 ENCOUNTER — Encounter: Payer: Self-pay | Admitting: Urology

## 2015-03-21 VITALS — BP 137/79 | HR 56 | Ht 68.0 in | Wt 175.1 lb

## 2015-03-21 DIAGNOSIS — N400 Enlarged prostate without lower urinary tract symptoms: Secondary | ICD-10-CM

## 2015-03-21 DIAGNOSIS — Z8546 Personal history of malignant neoplasm of prostate: Secondary | ICD-10-CM

## 2015-03-21 DIAGNOSIS — N3040 Irradiation cystitis without hematuria: Secondary | ICD-10-CM

## 2015-03-21 LAB — URINALYSIS, COMPLETE
Bilirubin, UA: NEGATIVE
Glucose, UA: NEGATIVE
Ketones, UA: NEGATIVE
LEUKOCYTES UA: NEGATIVE
Nitrite, UA: NEGATIVE
PH UA: 7 (ref 5.0–7.5)
Protein, UA: NEGATIVE
RBC, UA: NEGATIVE
Specific Gravity, UA: 1.02 (ref 1.005–1.030)
Urobilinogen, Ur: 0.2 mg/dL (ref 0.2–1.0)

## 2015-03-21 LAB — MICROSCOPIC EXAMINATION: RENAL EPITHEL UA: NONE SEEN /HPF

## 2015-03-21 MED ORDER — LIDOCAINE HCL 2 % EX GEL
1.0000 "application " | Freq: Once | CUTANEOUS | Status: AC
  Administered 2015-03-21: 1 via URETHRAL

## 2015-03-21 MED ORDER — TAMSULOSIN HCL 0.4 MG PO CAPS: 0.4000 mg | ORAL_CAPSULE | Freq: Every day | ORAL | Status: DC

## 2015-03-21 MED ORDER — CIPROFLOXACIN HCL 500 MG PO TABS
500.0000 mg | ORAL_TABLET | Freq: Once | ORAL | Status: AC
  Administered 2015-03-21: 500 mg via ORAL

## 2015-03-21 NOTE — Progress Notes (Signed)
10:19 AM  10/15/14  Duane Hill 1940/11/25 [id number]  Referring provider: Guadalupe Maple, MD [address] [place], [place] [address]  Chief Complaint  Patient presents with  . Cysto    pt notice gross hematuria x 2 mths for about 4-5 days. Pt contributes it to his cholesterol medication    HPI: 74 year old male with intermittent gross hematuria/ history clot retention/ radiation cystitis, history of high risk prostate cancer, status post external beam radiation with brachytherapy boost dx in 2013.   He was diagnosed with Gleason 4+4 prostate cancer, iPSA 11, T1c in 2013, prostate volume 96 cc by Dr. Bernardo Heater. He underwent external beam radiation with brachytherapy boost by Dr. Josph Macho most recent documented PSA in our system was 07/2012 which was less than 0.1 ng/dL.   CT Urogram from 10/02/14 shows enlarged prostate with brachytherapy seeds, otherwise no obvious GU pathology.  Cysto in 09/2014 showed a questionable lesion at bladder neck favoring a benign appearance with negative cytology.  3 months repeat cystoscopy was recommended for follow up and he returns today for this.  He has had 2 episodes of painless gross hematuria since last visit.     PMH: Past Medical History  Diagnosis Date  . Hyperlipidemia   . Hypertension   . Radiation proctitis   . GERD (gastroesophageal reflux disease)   . Anemia   . Arthritis   . Dupuytren contracture   . Diabetes mellitus without complication (HCC)     Metformin  . Asthma     Inhaler as needed  . Cancer [facility])     Prostate  . Renal insufficiency     Surgical History: Past Surgical History  Procedure Laterality Date  . Prostate surgery      Home Medications:    Medication List       This list is accurate as of: 03/21/15 10:19 AM.  Always use your most recent med list.               acetaminophen 500 MG tablet  Commonly known as:  TYLENOL  Take 1,000 mg by mouth every 6 (six) hours as needed. Patient  uss Caplet extra strength     albuterol 108 (90 BASE) MCG/ACT inhaler  Commonly known as:  PROVENTIL HFA;VENTOLIN HFA  Inhale 2 puffs into the lungs every 6 (six) hours as needed for wheezing or shortness of breath.     Azelastine HCl 0.15 % Soln  Place 2 sprays into the nose every morning.     azelastine 0.1 % nasal spray  Commonly known as:  ASTELIN     carvedilol 25 MG tablet  Commonly known as:  COREG  Take 1 tablet (25 mg total) by mouth 2 (two) times daily with a meal.     famciclovir 500 MG tablet  Commonly known as:  FAMVIR     Fenofibric Acid 135 MG Cpdr  Take 135 mg by mouth daily.     gabapentin 300 MG capsule  Commonly known as:  NEURONTIN  Take 1 capsule (300 mg total) by mouth 2 (two) times daily. 2 caps in am and 2 in pm     hydrochlorothiazide 12.5 MG capsule  Commonly known as:  MICROZIDE  Take 1 capsule (12.5 mg total) by mouth daily.     IPRATROPIUM-ALBUTEROL IN  Inhale 90 mcg into the lungs every 4 (four) hours. 2 puffs as needed (uses mainly in summer after moving)     metFORMIN 500 MG tablet  Commonly known as:  GLUCOPHAGE  Take 1 tablet (500 mg total) by mouth 2 (two) times daily with a meal. 1 in am and 2 hs     pantoprazole 40 MG tablet  Commonly known as:  PROTONIX  Take 1 tablet (40 mg total) by mouth daily.     quinapril 40 MG tablet  Commonly known as:  ACCUPRIL  Take 1 tablet (40 mg total) by mouth daily.     quinapril-hydrochlorothiazide 20-12.5 MG tablet  Commonly known as:  ACCURETIC  Take 1 tablet by mouth daily.     simvastatin 40 MG tablet  Commonly known as:  ZOCOR  Take 1 tablet (40 mg total) by mouth daily.     tamsulosin 0.4 MG Caps capsule  Commonly known as:  FLOMAX  Take 1 capsule (0.4 mg total) by mouth daily.     TRIPLEX AD PO  Take 135 mg by mouth every morning.        Allergies: No Known Allergies  Family History: Family History  Problem Relation Age of Onset  . Heart attack Mother   . Hypertension  Father   . Stroke Father   . Cancer Father   . Diabetes Father   . Heart attack Father     Social History:  reports that he quit smoking about 28 years ago. His smoking use included Cigarettes. He has never used smokeless tobacco. He reports that he drinks alcohol. He reports that he does not use illicit drugs.   Physical Exam: BP 137/79 mmHg  Pulse 56  Ht 5\' 8"  (1.727 m)  Wt 175 lb 1.6 oz (79.425 kg)  BMI 26.63 kg/m2  Constitutional:  Alert and oriented, No acute distress. HEENT: [place] AT, moist mucus membranes.  Trachea midline, no masses. Cardiovascular: No clubbing, cyanosis, or edema. Respiratory: Normal respiratory effort, no increased work of breathing. GI: Abdomen is soft, nontender, nondistended, no abdominal masses GU: No CVA tenderness.  Normal phallus, urethral meatus.   Skin: No rashes, bruises or suspicious lesions.. Neurologic: Grossly intact, no focal deficits, moving all 4 extremities. Psychiatric: Normal mood and affect.  Laboratory Data: Lab Results  Component Value Date   WBC 5.1 11/05/2014   HGB 10.9* 08/16/2014   HCT 38.3 11/05/2014   MCV 88 08/16/2014   PLT 233 08/16/2014    Lab Results  Component Value Date   CREATININE 1.25 11/05/2014    Lab Results  Component Value Date   PSA <0.1 11/05/2014   PSA <0.1 08/07/2012   PSA <0.1 02/07/2012    Pertinent Imaging: CT Urogram 10/02/14 IMPRESSION: 1. Mild distension of bladder with Foley catheter place (estimated 282 cubic cm). 2. Prostate hypertrophy with brachytherapy seeds. 3. Bosniak 1 renal cyst the right kidney. 4. Hepatic steatosis.   Cystoscopy Procedure Note  Patient identification was confirmed, informed consent was obtained, and patient was prepped using Betadine solution.  Lidocaine jelly was administered per urethral meatus.    Preoperative abx where received prior to procedure.     Pre-Procedure: - Inspection reveals a normal caliber ureteral meatus.  Procedure: The  flexible cystoscope was introduced without difficulty - No urethral strictures/lesions are present. - Enlarged prostate  - Elevated bladder neck (mildy) - Bilateral ureteral orifices identified - Bladder mucosa  reveals obvious no ulcers, tumors, or lesions - No bladder stones - Mild trabeculation  Retroflexion shows intravesical protrusion of prostate with mass effect, and friable ectatic vessels (bleeding with manipulation) c/w radiation changes, no masses or tumors  Post-Procedure: - Patient  tolerated the procedure well   Assessment & Plan:  74 year old male with recurrent gross hematuria/ radiation cystitis, history of prostate cancer s/p XRT with brachytherapy boost.       1. Radiation cystitis Findings on cysto and history c/w radiation cystitis.  Discussed risk / benefits of continuing ASA 81 mg.  He will continue for now given his cardiac risk factors but hold if begins to bleed. Recommend consideration of cystoscopy every 1-2 years if continues to have gross hematuria to ensure no underlying tumor - Urinalysis, Complete - ciprofloxacin (CIPRO) tablet 500 mg; Take 1 tablet (500 mg total) by mouth once. - lidocaine (XYLOCAINE) 2 % jelly 1 application; Place 1 application into the urethra once.  2. History of prostate cancer Dx Gleason 4+4 prostate cancer, iPSA 11, T1c in 2013, prostate volume 96 cc He underwent external beam radiation with brachytherapy boost.  PSA presumably being followed by PCP. -PSA drawn today   3. BPH (benign prostatic hyperplasia) Continue Flomax for BPH symptoms of hesitancy and post void dribbling   Return in about 1 year (around 03/20/2016) for f/u prostate CA (PSA) and hematuria.  Hollice Espy, MD  [facility] [address] [place], [place] [address] [phone]

## 2015-03-22 LAB — PSA: Prostate Specific Ag, Serum: <0.1 ng/mL (ref 0.0–4.0)

## 2015-03-26 ENCOUNTER — Telehealth: Payer: Self-pay

## 2015-03-26 NOTE — Telephone Encounter (Signed)
-----   Message from Hollice Espy, MD sent at 03/26/2015  8:28 AM EST ----- Please let patient know that his PSA remains undetectable, this is great news!  Hollice Espy, MD

## 2015-03-26 NOTE — Telephone Encounter (Signed)
Spoke with pt in reference to PSA. Pt voiced understanding.  

## 2015-03-27 ENCOUNTER — Encounter: Payer: Self-pay | Admitting: Family Medicine

## 2015-05-20 ENCOUNTER — Encounter: Payer: Self-pay | Admitting: Family Medicine

## 2015-05-20 ENCOUNTER — Ambulatory Visit (INDEPENDENT_AMBULATORY_CARE_PROVIDER_SITE_OTHER): Payer: Medicare Other | Admitting: Family Medicine

## 2015-05-20 VITALS — BP 144/77 | HR 59 | Temp 98.5°F | Ht 66.1 in | Wt 175.0 lb

## 2015-05-20 DIAGNOSIS — I10 Essential (primary) hypertension: Secondary | ICD-10-CM

## 2015-05-20 DIAGNOSIS — E785 Hyperlipidemia, unspecified: Secondary | ICD-10-CM

## 2015-05-20 DIAGNOSIS — E119 Type 2 diabetes mellitus without complications: Secondary | ICD-10-CM

## 2015-05-20 LAB — LP+ALT+AST PICCOLO, WAIVED
ALT (SGPT) Piccolo, Waived: 36 U/L (ref 10–47)
AST (SGOT) Piccolo, Waived: 34 U/L (ref 11–38)
CHOL/HDL RATIO PICCOLO,WAIVE: 5.9 mg/dL — AB
Cholesterol Piccolo, Waived: 153 mg/dL (ref <200)
HDL Chol Piccolo, Waived: 26 mg/dL — ABNORMAL LOW (ref >59)
LDL CHOL CALC PICCOLO WAIVED: 54 mg/dL (ref <100)
TRIGLYCERIDES PICCOLO,WAIVED: 365 mg/dL — AB (ref <150)
VLDL CHOL CALC PICCOLO,WAIVE: 73 mg/dL — AB (ref <30)

## 2015-05-20 LAB — BAYER DCA HB A1C WAIVED: HB A1C (BAYER DCA - WAIVED): 6.6 % (ref <7.0)

## 2015-05-20 NOTE — Assessment & Plan Note (Signed)
The current medical regimen is effective;  continue present plan and medications.  

## 2015-05-20 NOTE — Progress Notes (Signed)
BP 144/77 mmHg  Pulse 59  Temp(Src) 98.5 F (36.9 C)  Ht 5' 6.1" (1.679 m)  Wt 175 lb (79.379 kg)  BMI 28.16 kg/m2  SpO2 95%   Subjective:    Patient ID: Duane Hill., male    DOB: 10-Aug-1940, 75 y.o.   MRN: [id number]  HPI: Duane Hill. is a 75 y.o. male  Chief Complaint  Patient presents with  . Diabetes  . Hypertension  . Hyperlipidemia   agent doing well with diabetes hypertension hypercholesterol no complaints from medications takes faithfully no low blood sugar spells good glucose control. Agent did well over the holidays Patient had some hematuria discuss aspirin use and hematuria recommended patient to stop his aspirin  Relevant past medical, surgical, family and social history reviewed and updated as indicated. Interim medical history since our last visit reviewed. Allergies and medications reviewed and updated.  Review of Systems  Constitutional: Negative.   Respiratory: Negative.   Cardiovascular: Negative.     Per HPI unless specifically indicated above     Objective:    BP 144/77 mmHg  Pulse 59  Temp(Src) 98.5 F (36.9 C)  Ht 5' 6.1" (1.679 m)  Wt 175 lb (79.379 kg)  BMI 28.16 kg/m2  SpO2 95%  Wt Readings from Last 3 Encounters:  05/20/15 175 lb (79.379 kg)  03/21/15 175 lb 1.6 oz (79.425 kg)  02/05/15 171 lb (77.565 kg)    Physical Exam  Constitutional: He is oriented to person, place, and time. He appears well-developed and well-nourished. No distress.  HENT:  Head: Normocephalic and atraumatic.  Right Ear: Hearing normal.  Left Ear: Hearing normal.  Nose: Nose normal.  Eyes: Conjunctivae and lids are normal. Right eye exhibits no discharge. Left eye exhibits no discharge. No scleral icterus.  Cardiovascular: Normal rate, regular rhythm and normal heart sounds.   Pulmonary/Chest: Effort normal and breath sounds normal. No respiratory distress.  Musculoskeletal: Normal range of motion.  Neurological: He is alert and oriented  to person, place, and time.  Skin: Skin is intact. No rash noted.  Psychiatric: He has a normal mood and affect. His speech is normal and behavior is normal. Judgment and thought content normal. Cognition and memory are normal.    Results for orders placed or performed in visit on 03/21/15  Microscopic Examination  Result Value Ref Range   WBC, UA 0-5 0 -  5 /hpf   RBC, UA 0-2 0 -  2 /hpf   Epithelial Cells (non renal) 0-10 0 - 10 /hpf   Renal Epithel, UA None seen None seen /hpf   Casts Present (A) None seen /lpf   Cast Type Hyaline casts N/A   Bacteria, UA Few (A) None seen/Few  Urinalysis, Complete  Result Value Ref Range   Specific Gravity, UA 1.020 1.005 - 1.030   pH, UA 7.0 5.0 - 7.5   Color, UA Yellow Yellow   Appearance Ur Clear Clear   Leukocytes, UA Negative Negative   Protein, UA Negative Negative/Trace   Glucose, UA Negative Negative   Ketones, UA Negative Negative   RBC, UA Negative Negative   Bilirubin, UA Negative Negative   Urobilinogen, Ur 0.2 0.2 - 1.0 mg/dL   Nitrite, UA Negative Negative   Microscopic Examination See below:   PSA  Result Value Ref Range   Prostate Specific Ag, Serum <0.1 0.0 - 4.0 ng/mL      Assessment & Plan:   Problem List Items Addressed This Visit  Cardiovascular and Mediastinum   Hypertension    The current medical regimen is effective;  continue present plan and medications.         Endocrine   Diabetes mellitus ([place])    The current medical regimen is effective;  continue present plan and medications.         Other   Hyperlipidemia    The current medical regimen is effective;  continue present plan and medications.        Other Visit Diagnoses    Essential hypertension, benign    -  Primary    Relevant Orders    Basic metabolic panel    Diabetes mellitus without complication ([place])        Relevant Orders    Bayer DCA Hb A1c Waived    Hyperlipemia        Relevant Orders    LP+ALT+AST Piccolo, Waived         Follow up plan: Return in about 3 months (around 08/17/2015) for a1c.

## 2015-05-21 ENCOUNTER — Encounter: Payer: Self-pay | Admitting: Family Medicine

## 2015-05-21 LAB — BASIC METABOLIC PANEL
BUN / CREAT RATIO: 15 (ref 10–22)
BUN: 17 mg/dL (ref 8–27)
CHLORIDE: 102 mmol/L (ref 96–106)
CO2: 23 mmol/L (ref 18–29)
CREATININE: 1.15 mg/dL (ref 0.76–1.27)
Calcium: 9.1 mg/dL (ref 8.6–10.2)
GFR calc Af Amer: 73 mL/min/{1.73_m2} (ref >59)
GFR calc non Af Amer: 63 mL/min/{1.73_m2} (ref >59)
GLUCOSE: 132 mg/dL — AB (ref 65–99)
Potassium: 4.2 mmol/L (ref 3.5–5.2)
SODIUM: 140 mmol/L (ref 134–144)

## 2015-06-02 DIAGNOSIS — H2513 Age-related nuclear cataract, bilateral: Secondary | ICD-10-CM | POA: Diagnosis not present

## 2015-06-02 DIAGNOSIS — E119 Type 2 diabetes mellitus without complications: Secondary | ICD-10-CM | POA: Diagnosis not present

## 2015-06-02 LAB — HM DIABETES EYE EXAM

## 2015-08-19 ENCOUNTER — Encounter: Payer: Self-pay | Admitting: Family Medicine

## 2015-08-19 ENCOUNTER — Ambulatory Visit (INDEPENDENT_AMBULATORY_CARE_PROVIDER_SITE_OTHER): Payer: Medicare Other | Admitting: Family Medicine

## 2015-08-19 VITALS — BP 137/72 | HR 55 | Temp 98.0°F | Ht 66.1 in | Wt 176.0 lb

## 2015-08-19 DIAGNOSIS — E785 Hyperlipidemia, unspecified: Secondary | ICD-10-CM | POA: Diagnosis not present

## 2015-08-19 DIAGNOSIS — I10 Essential (primary) hypertension: Secondary | ICD-10-CM

## 2015-08-19 DIAGNOSIS — E119 Type 2 diabetes mellitus without complications: Secondary | ICD-10-CM

## 2015-08-19 LAB — BAYER DCA HB A1C WAIVED: HB A1C: 7.2 % — AB (ref <7.0)

## 2015-08-19 MED ORDER — METFORMIN HCL 500 MG PO TABS: 1000.0000 mg | ORAL_TABLET | Freq: Two times a day (BID) | ORAL | Status: DC

## 2015-08-19 MED ORDER — ATORVASTATIN CALCIUM 20 MG PO TABS: 20.0000 mg | ORAL_TABLET | Freq: Every day | ORAL | Status: DC

## 2015-08-19 NOTE — Assessment & Plan Note (Signed)
Discuss need for treatment with improvement of diabetes will increase metformin to 2 in the morning 2 in the evening do better with diet exercise nutrition. Increase walking

## 2015-08-19 NOTE — Assessment & Plan Note (Signed)
The current medical regimen is effective;  continue present plan and medications.  

## 2015-08-19 NOTE — Assessment & Plan Note (Signed)
Discussed cholesterol need for moderation of risk factors will start atorvastatin low-dose 20 mg

## 2015-08-19 NOTE — Progress Notes (Signed)
BP 137/72 mmHg  Pulse 55  Temp(Src) 98 F (36.7 C)  Ht 5' 6.1" (1.679 m)  Wt 176 lb (79.833 kg)  BMI 28.32 kg/m2  SpO2 97%   Subjective:    Patient ID: Duane Edelman., male    DOB: 1940-12-03, 75 y.o.   MRN: [id number]  HPI: Duane Alarcon. is a 75 y.o. male  Chief Complaint  Patient presents with  . Diabetes   Patient follow-up diabetes noted low blood sugar spells taking metformin 1500 mg a day doing well no complaints. Blood pressure taking medications faithfully without side effects good control of blood pressure Cholesterol has been taking medicines off and on as with Rondec radiation cystitis with intermittent bleeding patient had stopped his aspirin and urologists has to stop his simvastatin bleeding stopped stayed off aspirin restarted simvastatin and has had some intermittent bleeding and is recently stopped his simvastatin. Bleeding has been resolved.  Relevant past medical, surgical, family and social history reviewed and updated as indicated. Interim medical history since our last visit reviewed. Allergies and medications reviewed and updated.  Review of Systems  Constitutional: Negative.   Respiratory: Negative.   Cardiovascular: Negative.     Per HPI unless specifically indicated above     Objective:    BP 137/72 mmHg  Pulse 55  Temp(Src) 98 F (36.7 C)  Ht 5' 6.1" (1.679 m)  Wt 176 lb (79.833 kg)  BMI 28.32 kg/m2  SpO2 97%  Wt Readings from Last 3 Encounters:  08/19/15 176 lb (79.833 kg)  05/20/15 175 lb (79.379 kg)  03/21/15 175 lb 1.6 oz (79.425 kg)    Physical Exam  Constitutional: He is oriented to person, place, and time. He appears well-developed and well-nourished. No distress.  HENT:  Head: Normocephalic and atraumatic.  Right Ear: Hearing normal.  Left Ear: Hearing normal.  Nose: Nose normal.  Eyes: Conjunctivae and lids are normal. Right eye exhibits no discharge. Left eye exhibits no discharge. No scleral icterus.   Cardiovascular: Normal rate, regular rhythm and normal heart sounds.   Pulmonary/Chest: Effort normal and breath sounds normal. No respiratory distress.  Musculoskeletal: Normal range of motion.  Neurological: He is alert and oriented to person, place, and time.  Skin: Skin is intact. No rash noted.  Psychiatric: He has a normal mood and affect. His speech is normal and behavior is normal. Judgment and thought content normal. Cognition and memory are normal.    Results for orders placed or performed in visit on 06/04/15  HM DIABETES EYE EXAM  Result Value Ref Range   HM Diabetic Eye Exam No Retinopathy No Retinopathy      Assessment & Plan:   Problem List Items Addressed This Visit      Cardiovascular and Mediastinum   Hypertension    The current medical regimen is effective;  continue present plan and medications.       Relevant Medications   atorvastatin (LIPITOR) 20 MG tablet     Endocrine   Diabetes mellitus ([place]) - Primary    Discuss need for treatment with improvement of diabetes will increase metformin to 2 in the morning 2 in the evening do better with diet exercise nutrition. Increase walking      Relevant Medications   metFORMIN (GLUCOPHAGE) 500 MG tablet   atorvastatin (LIPITOR) 20 MG tablet   Other Relevant Orders   Bayer DCA Hb A1c Waived     Other   Hyperlipidemia    Discussed cholesterol need for  moderation of risk factors will start atorvastatin low-dose 20 mg      Relevant Medications   atorvastatin (LIPITOR) 20 MG tablet       Follow up plan: Return in about 3 months (around 11/19/2015) for Physical Exam.

## 2015-11-06 ENCOUNTER — Encounter: Payer: Medicare Other | Admitting: Family Medicine

## 2015-11-10 ENCOUNTER — Other Ambulatory Visit: Payer: Self-pay | Admitting: Family Medicine

## 2015-11-17 ENCOUNTER — Ambulatory Visit (INDEPENDENT_AMBULATORY_CARE_PROVIDER_SITE_OTHER): Payer: Medicare Other | Admitting: Family Medicine

## 2015-11-17 ENCOUNTER — Encounter: Payer: Self-pay | Admitting: Family Medicine

## 2015-11-17 VITALS — BP 113/70 | HR 55 | Temp 97.9°F | Ht 67.0 in | Wt 170.0 lb

## 2015-11-17 DIAGNOSIS — K219 Gastro-esophageal reflux disease without esophagitis: Secondary | ICD-10-CM

## 2015-11-17 DIAGNOSIS — E785 Hyperlipidemia, unspecified: Secondary | ICD-10-CM

## 2015-11-17 DIAGNOSIS — C61 Malignant neoplasm of prostate: Secondary | ICD-10-CM

## 2015-11-17 DIAGNOSIS — Z0000 Encounter for general adult medical examination without abnormal findings: Secondary | ICD-10-CM

## 2015-11-17 DIAGNOSIS — E119 Type 2 diabetes mellitus without complications: Secondary | ICD-10-CM | POA: Diagnosis not present

## 2015-11-17 DIAGNOSIS — I10 Essential (primary) hypertension: Secondary | ICD-10-CM | POA: Diagnosis not present

## 2015-11-17 LAB — URINALYSIS, ROUTINE W REFLEX MICROSCOPIC
BILIRUBIN UA: NEGATIVE
GLUCOSE, UA: NEGATIVE
Ketones, UA: NEGATIVE
Leukocytes, UA: NEGATIVE
Nitrite, UA: NEGATIVE
PROTEIN UA: NEGATIVE
RBC UA: NEGATIVE
SPEC GRAV UA: 1.015 (ref 1.005–1.030)
UUROB: 2 mg/dL — AB (ref 0.2–1.0)
pH, UA: 6.5 (ref 5.0–7.5)

## 2015-11-17 LAB — BAYER DCA HB A1C WAIVED: HB A1C (BAYER DCA - WAIVED): 6.9 % (ref <7.0)

## 2015-11-17 MED ORDER — FENOFIBRIC ACID 135 MG PO CPDR: 135.0000 mg | DELAYED_RELEASE_CAPSULE | Freq: Every day | ORAL | 12 refills | Status: DC

## 2015-11-17 MED ORDER — ATORVASTATIN CALCIUM 20 MG PO TABS: 20.0000 mg | ORAL_TABLET | Freq: Every day | ORAL | 12 refills | Status: DC

## 2015-11-17 MED ORDER — METFORMIN HCL 500 MG PO TABS: 1000.0000 mg | ORAL_TABLET | Freq: Two times a day (BID) | ORAL | 12 refills | Status: DC

## 2015-11-17 MED ORDER — AZELASTINE HCL 0.1 % NA SOLN: 2.0000 | Freq: Two times a day (BID) | NASAL | 12 refills | Status: DC

## 2015-11-17 MED ORDER — PANTOPRAZOLE SODIUM 40 MG PO TBEC: 40.0000 mg | DELAYED_RELEASE_TABLET | Freq: Every day | ORAL | 12 refills | Status: DC

## 2015-11-17 MED ORDER — TAMSULOSIN HCL 0.4 MG PO CAPS: 0.4000 mg | ORAL_CAPSULE | Freq: Every day | ORAL | 12 refills | Status: DC

## 2015-11-17 MED ORDER — HYDROCHLOROTHIAZIDE 12.5 MG PO CAPS: 12.5000 mg | ORAL_CAPSULE | Freq: Every day | ORAL | 12 refills | Status: DC

## 2015-11-17 MED ORDER — GABAPENTIN 300 MG PO CAPS: 300.0000 mg | ORAL_CAPSULE | Freq: Two times a day (BID) | ORAL | 12 refills | Status: DC

## 2015-11-17 MED ORDER — QUINAPRIL HCL 40 MG PO TABS: 40.0000 mg | ORAL_TABLET | Freq: Every day | ORAL | 12 refills | Status: DC

## 2015-11-17 MED ORDER — CARVEDILOL 25 MG PO TABS: 25.0000 mg | ORAL_TABLET | Freq: Two times a day (BID) | ORAL | 12 refills | Status: DC

## 2015-11-17 NOTE — Assessment & Plan Note (Signed)
Followed by urology.   

## 2015-11-17 NOTE — Progress Notes (Signed)
BP 113/70 (BP Location: Left Arm, Patient Position: Sitting, Cuff Size: Normal)   Pulse (!) 55   Temp 97.9 F (36.6 C)   Ht 5\' 7"  (1.702 m)   Wt 170 lb (77.1 kg)   SpO2 99%   BMI 26.63 kg/m    Subjective:    Patient ID: Duane Edelman., male    DOB: 10/24/1940, 75 y.o.   MRN: [id number]  HPI: Duane Cambray. is a 74 y.o. male  Chief Complaint  Patient presents with  . Annual Exam  Patient also follow up medical problems also with hypertension doing well no complaints from medications takes faithfully. Cholesterol doing well no complaints Diabetes noted low blood sugar spells no issues Urinary tract stable does have some proctitis changes with occasional blood takes iron pills without problems  Relevant past medical, surgical, family and social history reviewed and updated as indicated. Interim medical history since our last visit reviewed. Allergies and medications reviewed and updated.  Review of Systems  Constitutional: Negative.   HENT: Positive for rhinorrhea.        Patient with nasal congestion especially at night and sometimes with eating uses the nose spray prescription which helps some has not tried Nettie pot which patient will do if continues will consider ENT referral.  Eyes: Negative.   Respiratory: Negative.   Cardiovascular: Negative.   Gastrointestinal: Negative.   Endocrine: Negative.   Genitourinary: Negative.   Musculoskeletal: Negative.   Skin: Negative.   Allergic/Immunologic: Negative.   Neurological: Negative.   Hematological: Negative.   Psychiatric/Behavioral: Negative.     Per HPI unless specifically indicated above     Objective:    BP 113/70 (BP Location: Left Arm, Patient Position: Sitting, Cuff Size: Normal)   Pulse (!) 55   Temp 97.9 F (36.6 C)   Ht 5\' 7"  (1.702 m)   Wt 170 lb (77.1 kg)   SpO2 99%   BMI 26.63 kg/m   Wt Readings from Last 3 Encounters:  11/17/15 170 lb (77.1 kg)  08/19/15 176 lb (79.8 kg)    05/20/15 175 lb (79.4 kg)    Physical Exam  Constitutional: He is oriented to person, place, and time. He appears well-developed and well-nourished.  HENT:  Head: Normocephalic and atraumatic.  Right Ear: External ear normal.  Left Ear: External ear normal.  Eyes: Conjunctivae and EOM are normal. Pupils are equal, round, and reactive to light.  Neck: Normal range of motion. Neck supple.  Cardiovascular: Normal rate, regular rhythm, normal heart sounds and intact distal pulses.   Pulmonary/Chest: Effort normal and breath sounds normal.  Abdominal: Soft. Bowel sounds are normal. There is no splenomegaly or hepatomegaly.  Genitourinary:  Genitourinary Comments: Followed by urology  Musculoskeletal: Normal range of motion.  Neurological: He is alert and oriented to person, place, and time. He has normal reflexes.  Skin: No rash noted. No erythema.  Psychiatric: He has a normal mood and affect. His behavior is normal. Judgment and thought content normal.    Results for orders placed or performed in visit on 08/19/15  Bayer DCA Hb A1c Waived  Result Value Ref Range   Bayer DCA Hb A1c Waived 7.2 (H) <7.0 %      Assessment & Plan:   Problem List Items Addressed This Visit      Cardiovascular and Mediastinum   Hypertension    The current medical regimen is effective;  continue present plan and medications.       Relevant  Medications   atorvastatin (LIPITOR) 20 MG tablet   carvedilol (COREG) 25 MG tablet   Choline Fenofibrate (FENOFIBRIC ACID) 135 MG CPDR   hydrochlorothiazide (MICROZIDE) 12.5 MG capsule   quinapril (ACCUPRIL) 40 MG tablet   Other Relevant Orders   Comprehensive metabolic panel   CBC with Differential/Platelet   TSH   Urinalysis, Routine w reflex microscopic (not at [facility])     Digestive   GERD (gastroesophageal reflux disease)    Well controlled with Protonix takes everyday      Relevant Medications   pantoprazole (PROTONIX) 40 MG tablet     Endocrine    Diabetes mellitus ([place])    The current medical regimen is effective;  continue present plan and medications.       Relevant Medications   atorvastatin (LIPITOR) 20 MG tablet   metFORMIN (GLUCOPHAGE) 500 MG tablet   quinapril (ACCUPRIL) 40 MG tablet   Other Relevant Orders   Bayer DCA Hb A1c Waived   Comprehensive metabolic panel   CBC with Differential/Platelet   TSH   Urinalysis, Routine w reflex microscopic (not at [facility])     Genitourinary   Malignant neoplasm of prostate ([place])    Followed by urology      Relevant Medications   famciclovir (FAMVIR) 500 MG tablet   Other Relevant Orders   PSA     Other   Hyperlipidemia    The current medical regimen is effective;  continue present plan and medications.       Relevant Medications   atorvastatin (LIPITOR) 20 MG tablet   carvedilol (COREG) 25 MG tablet   Choline Fenofibrate (FENOFIBRIC ACID) 135 MG CPDR   hydrochlorothiazide (MICROZIDE) 12.5 MG capsule   quinapril (ACCUPRIL) 40 MG tablet   Other Relevant Orders   Comprehensive metabolic panel   Lipid panel   CBC with Differential/Platelet   TSH   Urinalysis, Routine w reflex microscopic (not at [facility])   PE (physical exam), annual - Primary   Relevant Orders   TSH   PSA    Other Visit Diagnoses   None.      Follow up plan: Return in about 3 months (around 02/17/2016) for a1c.

## 2015-11-17 NOTE — Assessment & Plan Note (Signed)
The current medical regimen is effective;  continue present plan and medications.  

## 2015-11-17 NOTE — Assessment & Plan Note (Signed)
Well controlled with Protonix takes everyday

## 2015-11-18 ENCOUNTER — Encounter: Payer: Self-pay | Admitting: Family Medicine

## 2015-11-18 LAB — COMPREHENSIVE METABOLIC PANEL
A/G RATIO: 2 (ref 1.2–2.2)
ALBUMIN: 4.3 g/dL (ref 3.5–4.8)
ALK PHOS: 37 IU/L — AB (ref 39–117)
ALT: 22 IU/L (ref 0–44)
AST: 15 IU/L (ref 0–40)
BILIRUBIN TOTAL: 0.3 mg/dL (ref 0.0–1.2)
BUN / CREAT RATIO: 19 (ref 10–24)
BUN: 23 mg/dL (ref 8–27)
CHLORIDE: 100 mmol/L (ref 96–106)
CO2: 23 mmol/L (ref 18–29)
Calcium: 9.4 mg/dL (ref 8.6–10.2)
Creatinine, Ser: 1.23 mg/dL (ref 0.76–1.27)
GFR calc Af Amer: 67 mL/min/{1.73_m2} (ref >59)
GFR calc non Af Amer: 58 mL/min/{1.73_m2} — ABNORMAL LOW (ref >59)
GLOBULIN, TOTAL: 2.1 g/dL (ref 1.5–4.5)
GLUCOSE: 129 mg/dL — AB (ref 65–99)
POTASSIUM: 4.1 mmol/L (ref 3.5–5.2)
SODIUM: 138 mmol/L (ref 134–144)
Total Protein: 6.4 g/dL (ref 6.0–8.5)

## 2015-11-18 LAB — LIPID PANEL
CHOLESTEROL TOTAL: 141 mg/dL (ref 100–199)
Chol/HDL Ratio: 5.9 ratio units — ABNORMAL HIGH (ref 0.0–5.0)
HDL: 24 mg/dL — ABNORMAL LOW (ref >39)
LDL Calculated: 71 mg/dL (ref 0–99)
Triglycerides: 230 mg/dL — ABNORMAL HIGH (ref 0–149)
VLDL Cholesterol Cal: 46 mg/dL — ABNORMAL HIGH (ref 5–40)

## 2015-11-18 LAB — CBC WITH DIFFERENTIAL/PLATELET
BASOS: 0 %
Basophils Absolute: 0 10*3/uL (ref 0.0–0.2)
EOS (ABSOLUTE): 0.1 10*3/uL (ref 0.0–0.4)
EOS: 2 %
HEMATOCRIT: 37.2 % — AB (ref 37.5–51.0)
Hemoglobin: 12 g/dL — ABNORMAL LOW (ref 12.6–17.7)
IMMATURE GRANS (ABS): 0 10*3/uL (ref 0.0–0.1)
IMMATURE GRANULOCYTES: 1 %
LYMPHS: 31 %
Lymphocytes Absolute: 2 10*3/uL (ref 0.7–3.1)
MCH: 27.6 pg (ref 26.6–33.0)
MCHC: 32.3 g/dL (ref 31.5–35.7)
MCV: 86 fL (ref 79–97)
Monocytes Absolute: 0.3 10*3/uL (ref 0.1–0.9)
Monocytes: 5 %
NEUTROS PCT: 61 %
Neutrophils Absolute: 3.9 10*3/uL (ref 1.4–7.0)
PLATELETS: 265 10*3/uL (ref 150–379)
RBC: 4.35 x10E6/uL (ref 4.14–5.80)
RDW: 15.3 % (ref 12.3–15.4)
WBC: 6.4 10*3/uL (ref 3.4–10.8)

## 2015-11-18 LAB — PSA

## 2015-11-18 LAB — TSH: TSH: 3.29 u[IU]/mL (ref 0.450–4.500)

## 2016-02-17 ENCOUNTER — Other Ambulatory Visit: Payer: Self-pay | Admitting: Family Medicine

## 2016-02-24 ENCOUNTER — Ambulatory Visit (INDEPENDENT_AMBULATORY_CARE_PROVIDER_SITE_OTHER): Payer: Medicare Other | Admitting: Family Medicine

## 2016-02-24 ENCOUNTER — Encounter: Payer: Self-pay | Admitting: Family Medicine

## 2016-02-24 VITALS — BP 126/68 | HR 59 | Temp 98.2°F | Wt 169.0 lb

## 2016-02-24 DIAGNOSIS — E119 Type 2 diabetes mellitus without complications: Secondary | ICD-10-CM

## 2016-02-24 DIAGNOSIS — K627 Radiation proctitis: Secondary | ICD-10-CM | POA: Diagnosis not present

## 2016-02-24 DIAGNOSIS — I10 Essential (primary) hypertension: Secondary | ICD-10-CM | POA: Diagnosis not present

## 2016-02-24 DIAGNOSIS — Z23 Encounter for immunization: Secondary | ICD-10-CM | POA: Diagnosis not present

## 2016-02-24 DIAGNOSIS — E7800 Pure hypercholesterolemia, unspecified: Secondary | ICD-10-CM

## 2016-02-24 LAB — MICROALBUMIN, URINE WAIVED
Creatinine, Urine Waived: 100 mg/dL (ref 10–300)
MICROALB, UR WAIVED: 10 mg/L (ref 0–19)
Microalb/Creat Ratio: <30 mg/g (ref <30)

## 2016-02-24 LAB — BAYER DCA HB A1C WAIVED: HB A1C (BAYER DCA - WAIVED): 6.8 % (ref <7.0)

## 2016-02-24 NOTE — Assessment & Plan Note (Signed)
The current medical regimen is effective;  continue present plan and medications.  

## 2016-02-24 NOTE — Progress Notes (Signed)
BP 126/68   Pulse (!) 59   Temp 98.2 F (36.8 C)   Wt 169 lb (76.7 kg)   SpO2 96%   BMI 26.47 kg/m    Subjective:    Patient ID: Duane Edelman., male    DOB: Aug 18, 1940, 75 y.o.   MRN: [id number]  HPI: Duane Sprau. is a 75 y.o. male  Chief Complaint  Patient presents with  . Diabetes   Diabetes doing well no complaints home glucose monitoring doing well noted low blood sugar spells.   Patient with some low back pain discomfort more associated with posture when bending sitting more low back discomfort no with no radiation relieved and helped with proper posture and sitting up straight. Patient's learned this is been ongoing and relieved with proper lifting technique etc.  Blood pressure and other medicines doing well reflux. 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    Relevant past medical, surgical, family and social history reviewed and updated as indicated. Interim medical history since our last visit reviewed. Allergies and medications reviewed and updated.  Review of Systems  Constitutional: Negative.   Respiratory: Negative.   Cardiovascular: Negative.     Per HPI unless specifically indicated above     Objective:    BP 126/68   Pulse (!) 59   Temp 98.2 F (36.8 C)   Wt 169 lb (76.7 kg)   SpO2 96%   BMI 26.47 kg/m   Wt Readings from Last 3 Encounters:  02/24/16 169 lb (76.7 kg)  11/17/15 170 lb (77.1 kg)  08/19/15 176 lb (79.8 kg)    Physical Exam  Constitutional: He is oriented to person, place, and time. He appears well-developed and well-nourished. No distress.  HENT:  Head: Normocephalic and atraumatic.  Right Ear: Hearing normal.  Left Ear: Hearing normal.  Nose: Nose normal.  Eyes: Conjunctivae and lids are normal. Right eye exhibits no discharge. Left eye exhibits no discharge. No scleral icterus.  Cardiovascular: Normal rate, regular rhythm and normal heart sounds.   Pulmonary/Chest: Effort  normal and breath sounds normal. No respiratory distress.  Musculoskeletal: Normal range of motion.  Neurological: He is alert and oriented to person, place, and time.  Skin: Skin is intact. No rash noted.  Psychiatric: He has a normal mood and affect. His speech is normal and behavior is normal. Judgment and thought content normal. Cognition and memory are normal.    Results for orders placed or performed in visit on 11/17/15  Bayer DCA Hb A1c Waived  Result Value Ref Range   Bayer DCA Hb A1c Waived 6.9 <7.0 %  Comprehensive metabolic panel  Result Value Ref Range   Glucose 129 (H) 65 - 99 mg/dL   BUN 23 8 - 27 mg/dL   Creatinine, Ser 1.23 0.76 - 1.27 mg/dL   GFR calc non Af Amer 58 (L) >59 mL/min/1.73   GFR calc Af Amer 67 >59 mL/min/1.73   BUN/Creatinine Ratio 19 10 - 24   Sodium 138 134 - 144 mmol/L   Potassium 4.1 3.5 - 5.2 mmol/L   Chloride 100 96 - 106 mmol/L   CO2 23 18 - 29 mmol/L   Calcium 9.4 8.6 - 10.2 mg/dL   Total Protein 6.4 6.0 - 8.5 g/dL   Albumin 4.3 3.5 - 4.8 g/dL   Globulin, Total 2.1 1.5 - 4.5 g/dL   Albumin/Globulin Ratio 2.0 1.2 - 2.2   Bilirubin Total 0.3 0.0 - 1.2 mg/dL  Alkaline Phosphatase 37 (L) 39 - 117 IU/L   AST 15 0 - 40 IU/L   ALT 22 0 - 44 IU/L  Lipid panel  Result Value Ref Range   Cholesterol, Total 141 100 - 199 mg/dL   Triglycerides 230 (H) 0 - 149 mg/dL   HDL 24 (L) >39 mg/dL   VLDL Cholesterol Cal 46 (H) 5 - 40 mg/dL   LDL Calculated 71 0 - 99 mg/dL   Chol/HDL Ratio 5.9 (H) 0.0 - 5.0 ratio units  CBC with Differential/Platelet  Result Value Ref Range   WBC 6.4 3.4 - 10.8 x10E3/uL   RBC 4.35 4.14 - 5.80 x10E6/uL   Hemoglobin 12.0 (L) 12.6 - 17.7 g/dL   Hematocrit 37.2 (L) 37.5 - 51.0 %   MCV 86 79 - 97 fL   MCH 27.6 26.6 - 33.0 pg   MCHC 32.3 31.5 - 35.7 g/dL   RDW 15.3 12.3 - 15.4 %   Platelets 265 150 - 379 x10E3/uL   Neutrophils 61 %   Lymphs 31 %   Monocytes 5 %   Eos 2 %   Basos 0 %   Neutrophils Absolute 3.9 1.4 -  7.0 x10E3/uL   Lymphocytes Absolute 2.0 0.7 - 3.1 x10E3/uL   Monocytes Absolute 0.3 0.1 - 0.9 x10E3/uL   EOS (ABSOLUTE) 0.1 0.0 - 0.4 x10E3/uL   Basophils Absolute 0.0 0.0 - 0.2 x10E3/uL   Immature Granulocytes 1 %   Immature Grans (Abs) 0.0 0.0 - 0.1 x10E3/uL  TSH  Result Value Ref Range   TSH 3.290 0.450 - 4.500 uIU/mL  Urinalysis, Routine w reflex microscopic (not at [facility])  Result Value Ref Range   Specific Gravity, UA 1.015 1.005 - 1.030   pH, UA 6.5 5.0 - 7.5   Color, UA Yellow Yellow   Appearance Ur Clear Clear   Leukocytes, UA Negative Negative   Protein, UA Negative Negative/Trace   Glucose, UA Negative Negative   Ketones, UA Negative Negative   RBC, UA Negative Negative   Bilirubin, UA Negative Negative   Urobilinogen, Ur 2.0 (H) 0.2 - 1.0 mg/dL   Nitrite, UA Negative Negative  PSA  Result Value Ref Range   Prostate Specific Ag, Serum <0.1 0.0 - 4.0 ng/mL      Assessment & Plan:   Problem List Items Addressed This Visit      Cardiovascular and Mediastinum   Hypertension    The current medical regimen is effective;  continue present plan and medications.         Digestive   Radiation proctitis    The current medical regimen is effective;  continue present plan and medications.         Endocrine   Diabetes mellitus ([place]) - Primary    The current medical regimen is effective;  continue present plan and medications.       Relevant Orders   Bayer DCA Hb A1c Waived   Microalbumin, Urine Waived   Pneumococcal polysaccharide vaccine 23-valent greater than or equal to 2yo subcutaneous/IM (Completed)     Other   Hyperlipidemia    The current medical regimen is effective;  continue present plan and medications.        Other Visit Diagnoses    Need for pneumococcal vaccination       Relevant Orders   Pneumococcal polysaccharide vaccine 23-valent greater than or equal to 2yo subcutaneous/IM (Completed)   Needs flu shot       Encounter for immunization  Relevant Orders   Flu vaccine HIGH DOSE PF (Completed)       Follow up plan: Return in about 3 months (around 05/26/2016) for Hemoglobin A1c, BMP,  Lipids, ALT, AST.

## 2016-03-23 ENCOUNTER — Ambulatory Visit: Payer: Medicare Other | Admitting: Urology

## 2016-03-24 ENCOUNTER — Ambulatory Visit: Payer: Medicare Other | Admitting: Urology

## 2016-03-25 ENCOUNTER — Ambulatory Visit: Payer: Medicare Other | Admitting: Urology

## 2016-04-08 ENCOUNTER — Telehealth: Payer: Self-pay

## 2016-04-08 MED ORDER — OMEPRAZOLE 20 MG PO CPDR: 20.0000 mg | DELAYED_RELEASE_CAPSULE | Freq: Every day | ORAL | 8 refills | Status: DC

## 2016-04-08 NOTE — Telephone Encounter (Signed)
Fax from pharmacy.   Request for refill on  Omeprazole 20mg  capsules.   This is not in patient's current list of medications, however patient is on pantoprazole 40mg  tablet.   Last visit 02/24/2016.

## 2016-05-25 ENCOUNTER — Encounter: Payer: Self-pay | Admitting: Family Medicine

## 2016-05-25 ENCOUNTER — Ambulatory Visit (INDEPENDENT_AMBULATORY_CARE_PROVIDER_SITE_OTHER): Payer: Medicare Other | Admitting: Family Medicine

## 2016-05-25 VITALS — BP 132/62 | HR 70 | Ht 67.0 in | Wt 172.0 lb

## 2016-05-25 DIAGNOSIS — I10 Essential (primary) hypertension: Secondary | ICD-10-CM

## 2016-05-25 DIAGNOSIS — E119 Type 2 diabetes mellitus without complications: Secondary | ICD-10-CM

## 2016-05-25 DIAGNOSIS — E7800 Pure hypercholesterolemia, unspecified: Secondary | ICD-10-CM | POA: Diagnosis not present

## 2016-05-25 LAB — LP+ALT+AST PICCOLO, WAIVED
ALT (SGPT) Piccolo, Waived: 31 U/L (ref 10–47)
AST (SGOT) Piccolo, Waived: 27 U/L (ref 11–38)
CHOLESTEROL PICCOLO, WAIVED: 141 mg/dL (ref <200)
Chol/HDL Ratio Piccolo,Waive: 5.8 mg/dL — ABNORMAL HIGH
HDL CHOL PICCOLO, WAIVED: 24 mg/dL — AB (ref >59)
LDL Chol Calc Piccolo Waived: 61 mg/dL (ref <100)
Triglycerides Piccolo,Waived: 280 mg/dL — ABNORMAL HIGH (ref <150)
VLDL Chol Calc Piccolo,Waive: 56 mg/dL — ABNORMAL HIGH (ref <30)

## 2016-05-25 LAB — BAYER DCA HB A1C WAIVED: HB A1C: 6.5 % (ref <7.0)

## 2016-05-25 MED ORDER — OMEPRAZOLE 20 MG PO CPDR: 20.0000 mg | DELAYED_RELEASE_CAPSULE | Freq: Every day | ORAL | 11 refills | Status: DC

## 2016-05-25 MED ORDER — TAMSULOSIN HCL 0.4 MG PO CAPS: 0.4000 mg | ORAL_CAPSULE | Freq: Every day | ORAL | 7 refills | Status: DC

## 2016-05-25 NOTE — Assessment & Plan Note (Signed)
The current medical regimen is effective;  continue present plan and medications.  

## 2016-05-25 NOTE — Progress Notes (Signed)
BP 132/62 (BP Location: Left Arm)   Pulse 70   Ht 5\' 7"  (1.702 m)   Wt 172 lb (78 kg)   SpO2 96%   BMI 26.94 kg/m    Subjective:    Patient ID: Duane Hill., male    DOB: Aug 26, 1940, 76 y.o.   MRN: [id number]  HPI: Duane Hill. is a 76 y.o. male  Chief Complaint  Patient presents with  . Follow-up  . Diabetes  . Hypertension  Patient follow-up diabetes doing well home blood sugar checks consistently 120 in the morning with no issues with low blood sugar no problems with medications Blood pressure good control   Relevant past medical, surgical, family and social history reviewed and updated as indicated. Interim medical history since our last visit reviewed. Allergies and medications reviewed and updated.  Review of Systems  Constitutional: Negative.   Respiratory: Negative.   Cardiovascular: Negative.     Per HPI unless specifically indicated above     Objective:    BP 132/62 (BP Location: Left Arm)   Pulse 70   Ht 5\' 7"  (1.702 m)   Wt 172 lb (78 kg)   SpO2 96%   BMI 26.94 kg/m   Wt Readings from Last 3 Encounters:  05/25/16 172 lb (78 kg)  02/24/16 169 lb (76.7 kg)  11/17/15 170 lb (77.1 kg)    Physical Exam  Constitutional: He is oriented to person, place, and time. He appears well-developed and well-nourished. No distress.  HENT:  Head: Normocephalic and atraumatic.  Right Ear: Hearing normal.  Left Ear: Hearing normal.  Nose: Nose normal.  Eyes: Conjunctivae and lids are normal. Right eye exhibits no discharge. Left eye exhibits no discharge. No scleral icterus.  Cardiovascular: Normal rate, regular rhythm and normal heart sounds.   Pulmonary/Chest: Effort normal and breath sounds normal. No respiratory distress.  Musculoskeletal: Normal range of motion.  Neurological: He is alert and oriented to person, place, and time.  Skin: Skin is intact. No rash noted.  Psychiatric: He has a normal mood and affect. His speech is normal and  behavior is normal. Judgment and thought content normal. Cognition and memory are normal.    Results for orders placed or performed in visit on 02/24/16  Bayer DCA Hb A1c Waived  Result Value Ref Range   Bayer DCA Hb A1c Waived 6.8 <7.0 %  Microalbumin, Urine Waived  Result Value Ref Range   Microalb, Ur Waived 10 0 - 19 mg/L   Creatinine, Urine Waived 100 10 - 300 mg/dL   Microalb/Creat Ratio <30 <30 mg/g      Assessment & Plan:   Problem List Items Addressed This Visit      Cardiovascular and Mediastinum   Hypertension - Primary    The current medical regimen is effective;  continue present plan and medications.       Relevant Orders   Bayer DCA Hb A1c Waived (STAT)   LP+ALT+AST Piccolo, Waived   Basic metabolic panel     Endocrine   Diabetes mellitus ([place])    The current medical regimen is effective;  continue present plan and medications.       Relevant Orders   Bayer DCA Hb A1c Waived (STAT)   LP+ALT+AST Piccolo, Waived   Basic metabolic panel     Other   Hyperlipidemia    The current medical regimen is effective;  continue present plan and medications.       Relevant Orders  Bayer DCA Hb A1c Waived (STAT)   LP+ALT+AST Piccolo, Waived   Basic metabolic panel       Follow up plan: Return in about 3 months (around 08/22/2016) for Hemoglobin A1c.

## 2016-05-26 ENCOUNTER — Encounter: Payer: Self-pay | Admitting: Family Medicine

## 2016-05-26 LAB — BASIC METABOLIC PANEL
BUN/Creatinine Ratio: 12 (ref 10–24)
BUN: 15 mg/dL (ref 8–27)
CO2: 25 mmol/L (ref 18–29)
CREATININE: 1.23 mg/dL (ref 0.76–1.27)
Calcium: 9.4 mg/dL (ref 8.6–10.2)
Chloride: 100 mmol/L (ref 96–106)
GFR calc Af Amer: 67 mL/min/{1.73_m2} (ref >59)
GFR calc non Af Amer: 58 mL/min/{1.73_m2} — ABNORMAL LOW (ref >59)
GLUCOSE: 118 mg/dL — AB (ref 65–99)
Potassium: 4.7 mmol/L (ref 3.5–5.2)
SODIUM: 140 mmol/L (ref 134–144)

## 2016-06-02 DIAGNOSIS — E119 Type 2 diabetes mellitus without complications: Secondary | ICD-10-CM | POA: Diagnosis not present

## 2016-06-02 DIAGNOSIS — H2513 Age-related nuclear cataract, bilateral: Secondary | ICD-10-CM | POA: Diagnosis not present

## 2016-06-02 LAB — HM DIABETES EYE EXAM

## 2016-06-23 ENCOUNTER — Other Ambulatory Visit: Payer: Self-pay | Admitting: Family Medicine

## 2016-06-23 NOTE — Telephone Encounter (Signed)
Last  OV: 05/25/16 Next OV: 08/24/16   RX was written on 09/25/15, not by a provider here.

## 2016-08-24 ENCOUNTER — Ambulatory Visit (INDEPENDENT_AMBULATORY_CARE_PROVIDER_SITE_OTHER): Payer: Medicare Other | Admitting: Family Medicine

## 2016-08-24 ENCOUNTER — Telehealth: Payer: Self-pay | Admitting: Family Medicine

## 2016-08-24 ENCOUNTER — Encounter: Payer: Self-pay | Admitting: Family Medicine

## 2016-08-24 VITALS — BP 142/75 | HR 62 | Wt 170.0 lb

## 2016-08-24 DIAGNOSIS — I10 Essential (primary) hypertension: Secondary | ICD-10-CM | POA: Diagnosis not present

## 2016-08-24 DIAGNOSIS — E119 Type 2 diabetes mellitus without complications: Secondary | ICD-10-CM

## 2016-08-24 DIAGNOSIS — E7800 Pure hypercholesterolemia, unspecified: Secondary | ICD-10-CM | POA: Diagnosis not present

## 2016-08-24 LAB — BAYER DCA HB A1C WAIVED: HB A1C (BAYER DCA - WAIVED): 6 % (ref <7.0)

## 2016-08-24 MED ORDER — METFORMIN HCL ER 500 MG PO TB24: 500.0000 mg | ORAL_TABLET | Freq: Two times a day (BID) | ORAL | 4 refills | Status: DC

## 2016-08-24 MED ORDER — METFORMIN HCL ER 500 MG PO TB24: 1000.0000 mg | ORAL_TABLET | Freq: Two times a day (BID) | ORAL | 4 refills | Status: DC

## 2016-08-24 NOTE — Assessment & Plan Note (Signed)
We will can continue current hypertension medications without change observe blood pressure and it stays high will consider increasing medications.

## 2016-08-24 NOTE — Addendum Note (Signed)
Addended byGolden Pop on: 08/24/2016 11:25 AM   Modules accepted: Orders

## 2016-08-24 NOTE — Assessment & Plan Note (Signed)
The current medical regimen is effective;  continue present plan and medications.  

## 2016-08-24 NOTE — Telephone Encounter (Signed)
Please advise. Per today's OV, " The current medical regimen is effective;  continue present plan and medications.        Relevant Medications   metFORMIN (GLUCOPHAGE XR) 500 MG 24 hr tablet   Other Relevant Orders   Bayer DCA Hb A1c Waived  "  RX was taking 2 tablets by mouth BID.

## 2016-08-24 NOTE — Progress Notes (Addendum)
   BP (!) 142/75   Pulse 62   Wt 170 lb (77.1 kg)   SpO2 97%   BMI 26.63 kg/m    Subjective:    Patient ID: Duane Edelman., male    DOB: 30-May-1940, 76 y.o.   MRN: [id number]  HPI: Duane Ledesma. is a 76 y.o. male  Chief Complaint  Patient presents with  . Follow-up  . Hypertension  . Diabetes   Patient under a great deal of stress with slight elevated blood pressure and just took blood pressure medicines little bit earlier this morning and blood pressure up slightly but otherwise is been good. Diabetes doing well. Concerned has some bowel upset with some loose stools really been ongoing for years and is been taking metformin for years. Cholesterol doing okay no complaints. Relevant past medical, surgical, family and social history reviewed and updated as indicated. Interim medical history since our last visit reviewed. Allergies and medications reviewed and updated.  Review of Systems  Constitutional: Negative.   Respiratory: Negative.   Cardiovascular: Negative.     Per HPI unless specifically indicated above     Objective:    BP (!) 142/75   Pulse 62   Wt 170 lb (77.1 kg)   SpO2 97%   BMI 26.63 kg/m   Wt Readings from Last 3 Encounters:  08/24/16 170 lb (77.1 kg)  05/25/16 172 lb (78 kg)  02/24/16 169 lb (76.7 kg)    Physical Exam  Constitutional: He is oriented to person, place, and time. He appears well-developed and well-nourished.  HENT:  Head: Normocephalic and atraumatic.  Eyes: Conjunctivae and EOM are normal.  Neck: Normal range of motion.  Cardiovascular: Normal rate, regular rhythm and normal heart sounds.   Pulmonary/Chest: Effort normal and breath sounds normal.  Musculoskeletal: Normal range of motion.  Neurological: He is alert and oriented to person, place, and time.  Skin: No erythema.  Psychiatric: He has a normal mood and affect. His behavior is normal. Judgment and thought content normal.    Results for orders placed or  performed in visit on 06/15/16  HM DIABETES EYE EXAM  Result Value Ref Range   HM Diabetic Eye Exam No Retinopathy No Retinopathy      Assessment & Plan:   Problem List Items Addressed This Visit      Cardiovascular and Mediastinum   Hypertension - Primary    We will can continue current hypertension medications without change observe blood pressure and it stays high will consider increasing medications.      Relevant Orders   Bayer DCA Hb A1c Waived     Endocrine   Diabetes mellitus ([place])    The current medical regimen is effective;  continue present plan and medications.       Relevant Medications   metFORMIN (GLUCOPHAGE XR) 500 MG 24 hr tablet   Other Relevant Orders   Bayer DCA Hb A1c Waived     Other   Hyperlipidemia   Relevant Orders   Bayer DCA Hb A1c Waived       Follow up plan: Return in about 3 months (around 11/24/2016) for Physical Exam, Hemoglobin A1c.

## 2016-09-02 ENCOUNTER — Other Ambulatory Visit: Payer: Self-pay | Admitting: Unknown Physician Specialty

## 2016-09-24 ENCOUNTER — Other Ambulatory Visit: Payer: Self-pay | Admitting: Family Medicine

## 2016-09-27 ENCOUNTER — Other Ambulatory Visit: Payer: Self-pay

## 2016-11-19 ENCOUNTER — Telehealth: Payer: Self-pay | Admitting: Family Medicine

## 2016-11-24 ENCOUNTER — Ambulatory Visit (INDEPENDENT_AMBULATORY_CARE_PROVIDER_SITE_OTHER): Payer: Medicare Other

## 2016-11-24 VITALS — BP 146/79 | HR 58 | Temp 98.4°F | Resp 16 | Ht 67.0 in | Wt 170.5 lb

## 2016-11-24 DIAGNOSIS — Z0000 Encounter for general adult medical examination without abnormal findings: Secondary | ICD-10-CM

## 2016-11-24 NOTE — Patient Instructions (Signed)
Duane Hill , Thank you for taking time to come for your Medicare Wellness Visit. I appreciate your ongoing commitment to your health goals. Please review the following plan we discussed and let me know if I can assist you in the future.   Screening recommendations/referrals: Colonoscopy: completed 01/26/2013 Recommended yearly ophthalmology/optometry visit for glaucoma screening and checkup Recommended yearly dental visit for hygiene and checkup  Vaccinations: Influenza vaccine: up to date, due,02/2017 Pneumococcal vaccine: up to date Tdap vaccine: up to date  Shingles vaccine: up to date    Advanced directives: Advance directive discussed with you today. I have provided a copy for you to complete at home and have notarized. Once this is complete please bring a copy in to our office so we can scan it into your chart.  Conditions/risks identified: none.  Next appointment: Follow up on 11/29/2016 at 9:30am with Dr.Crissman. Follow up in one year for your annual wellness exam.  Preventive Care 65 Years and Older, Male Preventive care refers to lifestyle choices and visits with your health care provider that can promote health and wellness. What does preventive care include?  A yearly physical exam. This is also called an annual well check.  Dental exams once or twice a year.  Routine eye exams. Ask your health care provider how often you should have your eyes checked.  Personal lifestyle choices, including:  Daily care of your teeth and gums.  Regular physical activity.  Eating a healthy diet.  Avoiding tobacco and drug use.  Limiting alcohol use.  Practicing safe sex.  Taking low doses of aspirin every day.  Taking vitamin and mineral supplements as recommended by your health care provider. What happens during an annual well check? The services and screenings done by your health care provider during your annual well check will depend on your age, overall health,  lifestyle risk factors, and family history of disease. Counseling  Your health care provider may ask you questions about your:  Alcohol use.  Tobacco use.  Drug use.  Emotional well-being.  Home and relationship well-being.  Sexual activity.  Eating habits.  History of falls.  Memory and ability to understand (cognition).  Work and work Statistician. Screening  You may have the following tests or measurements:  Height, weight, and BMI.  Blood pressure.  Lipid and cholesterol levels. These may be checked every 5 years, or more frequently if you are over 98 years old.  Skin check.  Lung cancer screening. You may have this screening every year starting at age 37 if you have a 30-pack-year history of smoking and currently smoke or have quit within the past 15 years.  Fecal occult blood test (FOBT) of the stool. You may have this test every year starting at age 26.  Flexible sigmoidoscopy or colonoscopy. You may have a sigmoidoscopy every 5 years or a colonoscopy every 10 years starting at age 53.  Prostate cancer screening. Recommendations will vary depending on your family history and other risks.  Hepatitis C blood test.  Hepatitis B blood test.  Sexually transmitted disease (STD) testing.  Diabetes screening. This is done by checking your blood sugar (glucose) after you have not eaten for a while (fasting). You may have this done every 1-3 years.  Abdominal aortic aneurysm (AAA) screening. You may need this if you are a current or former smoker.  Osteoporosis. You may be screened starting at age 41 if you are at high risk. Talk with your health care provider about your test  results, treatment options, and if necessary, the need for more tests. Vaccines  Your health care provider may recommend certain vaccines, such as:  Influenza vaccine. This is recommended every year.  Tetanus, diphtheria, and acellular pertussis (Tdap, Td) vaccine. You may need a Td booster  every 10 years.  Zoster vaccine. You may need this after age 27.  Pneumococcal 13-valent conjugate (PCV13) vaccine. One dose is recommended after age 74.  Pneumococcal polysaccharide (PPSV23) vaccine. One dose is recommended after age 30. Talk to your health care provider about which screenings and vaccines you need and how often you need them. This information is not intended to replace advice given to you by your health care provider. Make sure you discuss any questions you have with your health care provider. Document Released: 05/02/2015 Document Revised: 12/24/2015 Document Reviewed: 02/04/2015 Elsevier Interactive Patient Education  2017 [place] Prevention in the Home Falls can cause injuries. They can happen to people of all ages. There are many things you can do to make your home safe and to help prevent falls. What can I do on the outside of my home?  Regularly fix the edges of walkways and driveways and fix any cracks.  Remove anything that might make you trip as you walk through a door, such as a raised step or threshold.  Trim any bushes or trees on the path to your home.  Use bright outdoor lighting.  Clear any walking paths of anything that might make someone trip, such as rocks or tools.  Regularly check to see if handrails are loose or broken. Make sure that both sides of any steps have handrails.  Any raised decks and porches should have guardrails on the edges.  Have any leaves, snow, or ice cleared regularly.  Use sand or salt on walking paths during winter.  Clean up any spills in your garage right away. This includes oil or grease spills. What can I do in the bathroom?  Use night lights.  Install grab bars by the toilet and in the tub and shower. Do not use towel bars as grab bars.  Use non-skid mats or decals in the tub or shower.  If you need to sit down in the shower, use a plastic, non-slip stool.  Keep the floor dry. Clean up any  water that spills on the floor as soon as it happens.  Remove soap buildup in the tub or shower regularly.  Attach bath mats securely with double-sided non-slip rug tape.  Do not have throw rugs and other things on the floor that can make you trip. What can I do in the bedroom?  Use night lights.  Make sure that you have a light by your bed that is easy to reach.  Do not use any sheets or blankets that are too big for your bed. They should not hang down onto the floor.  Have a firm chair that has side arms. You can use this for support while you get dressed.  Do not have throw rugs and other things on the floor that can make you trip. What can I do in the kitchen?  Clean up any spills right away.  Avoid walking on wet floors.  Keep items that you use a lot in easy-to-reach places.  If you need to reach something above you, use a strong step stool that has a grab bar.  Keep electrical cords out of the way.  Do not use floor polish or wax that makes  floors slippery. If you must use wax, use non-skid floor wax.  Do not have throw rugs and other things on the floor that can make you trip. What can I do with my stairs?  Do not leave any items on the stairs.  Make sure that there are handrails on both sides of the stairs and use them. Fix handrails that are broken or loose. Make sure that handrails are as long as the stairways.  Check any carpeting to make sure that it is firmly attached to the stairs. Fix any carpet that is loose or worn.  Avoid having throw rugs at the top or bottom of the stairs. If you do have throw rugs, attach them to the floor with carpet tape.  Make sure that you have a light switch at the top of the stairs and the bottom of the stairs. If you do not have them, ask someone to add them for you. What else can I do to help prevent falls?  Wear shoes that:  Do not have high heels.  Have rubber bottoms.  Are comfortable and fit you well.  Are closed  at the toe. Do not wear sandals.  If you use a stepladder:  Make sure that it is fully opened. Do not climb a closed stepladder.  Make sure that both sides of the stepladder are locked into place.  Ask someone to hold it for you, if possible.  Clearly mark and make sure that you can see:  Any grab bars or handrails.  First and last steps.  Where the edge of each step is.  Use tools that help you move around (mobility aids) if they are needed. These include:  Canes.  Walkers.  Scooters.  Crutches.  Turn on the lights when you go into a dark area. Replace any light bulbs as soon as they burn out.  Set up your furniture so you have a clear path. Avoid moving your furniture around.  If any of your floors are uneven, fix them.  If there are any pets around you, be aware of where they are.  Review your medicines with your doctor. Some medicines can make you feel dizzy. This can increase your chance of falling. Ask your doctor what other things that you can do to help prevent falls. This information is not intended to replace advice given to you by your health care provider. Make sure you discuss any questions you have with your health care provider. Document Released: 01/30/2009 Document Revised: 09/11/2015 Document Reviewed: 05/10/2014 Elsevier Interactive Patient Education  2017 [employer].

## 2016-11-24 NOTE — Progress Notes (Signed)
Subjective:   Duane Hill. is a 76 y.o. male who presents for Medicare Annual/Subsequent preventive examination.  Review of Systems:  Cardiac Risk Factors include: male gender;diabetes mellitus;dyslipidemia;hypertension;advanced age (>98men, >56 women)     Objective:    Vitals: BP (!) 146/79 (BP Location: Left Arm, Patient Position: Sitting)   Pulse (!) 58   Temp 98.4 F (36.9 C)   Resp 16   Ht 5\' 7"  (1.702 m)   Wt 170 lb 8 oz (77.3 kg)   BMI 26.70 kg/m   Body mass index is 26.7 kg/m.  Tobacco History  Smoking Status  . Former Smoker  . Types: Cigarettes  . Quit date: 11/28/1986  Smokeless Tobacco  . Never Used     Counseling given: Not Answered   Past Medical History:  Diagnosis Date  . Arthritis   . Asthma    Inhaler as needed  . Dupuytren contracture   . GERD (gastroesophageal reflux disease)   . Hyperlipidemia   . Hypertension   . Radiation proctitis   . Renal insufficiency    Past Surgical History:  Procedure Laterality Date  . PROSTATE SURGERY     Family History  Problem Relation Age of Onset  . Heart attack Mother   . Hypertension Father   . Stroke Father   . Cancer Father   . Diabetes Father   . Heart attack Father    History  Sexual Activity  . Sexual activity: Not on file    Outpatient Encounter Prescriptions as of 11/24/2016  Medication Sig  . albuterol (PROVENTIL HFA;VENTOLIN HFA) 108 (90 BASE) MCG/ACT inhaler Inhale 2 puffs into the lungs every 6 (six) hours as needed for wheezing or shortness of breath.  Marland Kitchen atorvastatin (LIPITOR) 20 MG tablet Take 1 tablet (20 mg total) by mouth daily.  Marland Kitchen azelastine (ASTELIN) 0.1 % nasal spray Place 2 sprays into both nostrils 2 (two) times daily.  . carvedilol (COREG) 25 MG tablet Take 1 tablet (25 mg total) by mouth 2 (two) times daily with a meal.  . Choline Fenofibrate (FENOFIBRIC ACID) 135 MG CPDR Take 135 mg by mouth daily.  . famciclovir (FAMVIR) 500 MG tablet TAKE ONE TABLET THREE TIMES  A DAY.  Marland Kitchen gabapentin (NEURONTIN) 300 MG capsule Take 1 capsule (300 mg total) by mouth 2 (two) times daily. 2 caps in am and 2 in pm  . glucose blood test strip CHECK BLOOD SUGAR TWICE DAILY.  . hydrochlorothiazide (MICROZIDE) 12.5 MG capsule Take 1 capsule (12.5 mg total) by mouth daily.  . metFORMIN (GLUCOPHAGE XR) 500 MG 24 hr tablet Take 2 tablets (1,000 mg total) by mouth 2 (two) times daily.  Marland Kitchen omeprazole (PRILOSEC) 20 MG capsule Take 1 capsule (20 mg total) by mouth daily.  . quinapril (ACCUPRIL) 40 MG tablet Take 1 tablet (40 mg total) by mouth daily.  . tamsulosin (FLOMAX) 0.4 MG CAPS capsule Take 1 capsule (0.4 mg total) by mouth daily.  Marland Kitchen acetaminophen (TYLENOL) 500 MG tablet Take 1,000 mg by mouth every 6 (six) hours as needed. Patient uss Caplet extra strength   No facility-administered encounter medications on file as of 11/24/2016.     Activities of Daily Living In your present state of health, do you have any difficulty performing the following activities: 11/24/2016  Hearing? N  Vision? N  Difficulty concentrating or making decisions? N  Walking or climbing stairs? N  Dressing or bathing? N  Doing errands, shopping? N  Preparing Food and eating ?  N  Using the Toilet? N  In the past six months, have you accidently leaked urine? N  Do you have problems with loss of bowel control? N  Managing your Medications? N  Managing your Finances? N  Housekeeping or managing your Housekeeping? N  Some recent data might be hidden    Patient Care Team: Guadalupe Maple, MD as PCP - General (Family Medicine) Hollice Espy, MD as Consulting Physician (Urology)   Assessment:     Exercise Activities and Dietary recommendations Current Exercise Habits: The patient does not participate in regular exercise at present, Exercise limited by: None identified  Goals    None     Fall Risk Fall Risk  11/24/2016 08/24/2016 11/17/2015 11/05/2014  Falls in the past year? No No No No    Depression Screen PHQ 2/9 Scores 11/24/2016 11/17/2015 11/05/2014  PHQ - 2 Score 3 0 0  PHQ- 9 Score 3 - -    Cognitive Function     6CIT Screen 11/24/2016  What Year? 0 points  What month? 0 points  What time? 0 points  Count back from 20 0 points  Months in reverse 0 points  Repeat phrase 0 points  Total Score 0    Immunization History  Administered Date(s) Administered  . Influenza, High Dose Seasonal PF 02/24/2016  . Influenza,inj,Quad PF,36+ Mos 02/05/2015  . Pneumococcal Conjugate-13 10/18/2013  . Pneumococcal Polysaccharide-23 02/24/2016  . Pneumococcal-Unspecified 01/06/2009  . Tdap 07/20/2011  . Zoster 01/07/2009   Screening Tests Health Maintenance  Topic Date Due  . INFLUENZA VACCINE  11/17/2016  . HEMOGLOBIN A1C  02/24/2017  . FOOT EXAM  05/25/2017  . OPHTHALMOLOGY EXAM  06/02/2017  . COLONOSCOPY  01/22/2018  . TETANUS/TDAP  07/19/2021  . PNA vac Low Risk Adult  Completed      Plan:  I have personally reviewed and addressed the Medicare Annual Wellness questionnaire and have noted the following in the patient's chart:  A. Medical and social history B. Use of alcohol, tobacco or illicit drugs  C. Current medications and supplements D. Functional ability and status E.  Nutritional status F.  Physical activity G. Advance directives H. List of other physicians I.  Hospitalizations, surgeries, and ER visits in previous 12 months J.  [place] such as hearing and vision if needed, cognitive and depression L. Referrals and appointments  In addition, I have reviewed and discussed with patient certain preventive protocols, quality metrics, and best practice recommendations. A written personalized care plan for preventive services as well as general preventive health recommendations were provided to patient.   Signed,  Tyler Aas, LPN Nurse Health Advisor   MD Recommendations:none

## 2016-11-29 ENCOUNTER — Ambulatory Visit (INDEPENDENT_AMBULATORY_CARE_PROVIDER_SITE_OTHER): Payer: Medicare Other | Admitting: Family Medicine

## 2016-11-29 ENCOUNTER — Other Ambulatory Visit: Payer: Self-pay | Admitting: Family Medicine

## 2016-11-29 ENCOUNTER — Encounter: Payer: Medicare Other | Admitting: Family Medicine

## 2016-11-29 ENCOUNTER — Encounter: Payer: Self-pay | Admitting: Family Medicine

## 2016-11-29 VITALS — BP 132/78 | HR 60 | Wt 170.6 lb

## 2016-11-29 DIAGNOSIS — C61 Malignant neoplasm of prostate: Secondary | ICD-10-CM | POA: Diagnosis not present

## 2016-11-29 DIAGNOSIS — E785 Hyperlipidemia, unspecified: Secondary | ICD-10-CM | POA: Diagnosis not present

## 2016-11-29 DIAGNOSIS — Z125 Encounter for screening for malignant neoplasm of prostate: Secondary | ICD-10-CM | POA: Diagnosis not present

## 2016-11-29 DIAGNOSIS — Z1329 Encounter for screening for other suspected endocrine disorder: Secondary | ICD-10-CM | POA: Diagnosis not present

## 2016-11-29 DIAGNOSIS — Z7189 Other specified counseling: Secondary | ICD-10-CM | POA: Insufficient documentation

## 2016-11-29 DIAGNOSIS — E7800 Pure hypercholesterolemia, unspecified: Secondary | ICD-10-CM | POA: Diagnosis not present

## 2016-11-29 DIAGNOSIS — I10 Essential (primary) hypertension: Secondary | ICD-10-CM

## 2016-11-29 DIAGNOSIS — E119 Type 2 diabetes mellitus without complications: Secondary | ICD-10-CM

## 2016-11-29 MED ORDER — AZELASTINE HCL 0.1 % NA SOLN: 2.0000 | Freq: Two times a day (BID) | NASAL | 4 refills | Status: DC

## 2016-11-29 MED ORDER — GABAPENTIN 300 MG PO CAPS: 600.0000 mg | ORAL_CAPSULE | Freq: Two times a day (BID) | ORAL | 4 refills | Status: DC

## 2016-11-29 MED ORDER — ATORVASTATIN CALCIUM 20 MG PO TABS: 20.0000 mg | ORAL_TABLET | Freq: Every day | ORAL | 4 refills | Status: DC

## 2016-11-29 MED ORDER — QUINAPRIL HCL 40 MG PO TABS: 40.0000 mg | ORAL_TABLET | Freq: Every day | ORAL | 4 refills | Status: DC

## 2016-11-29 MED ORDER — METFORMIN HCL ER 500 MG PO TB24: 1000.0000 mg | ORAL_TABLET | Freq: Two times a day (BID) | ORAL | 4 refills | Status: DC

## 2016-11-29 MED ORDER — TAMSULOSIN HCL 0.4 MG PO CAPS: 0.4000 mg | ORAL_CAPSULE | Freq: Every day | ORAL | 4 refills | Status: DC

## 2016-11-29 MED ORDER — HYDROCHLOROTHIAZIDE 12.5 MG PO CAPS: 12.5000 mg | ORAL_CAPSULE | Freq: Every day | ORAL | 4 refills | Status: DC

## 2016-11-29 MED ORDER — CARVEDILOL 25 MG PO TABS: 25.0000 mg | ORAL_TABLET | Freq: Two times a day (BID) | ORAL | 4 refills | Status: DC

## 2016-11-29 MED ORDER — OMEPRAZOLE 20 MG PO CPDR: 20.0000 mg | DELAYED_RELEASE_CAPSULE | Freq: Every day | ORAL | 4 refills | Status: DC

## 2016-11-29 MED ORDER — FENOFIBRIC ACID 135 MG PO CPDR: 135.0000 mg | DELAYED_RELEASE_CAPSULE | Freq: Every day | ORAL | 4 refills | Status: DC

## 2016-11-29 NOTE — Assessment & Plan Note (Signed)
The current medical regimen is effective;  continue present plan and medications.  

## 2016-11-29 NOTE — Assessment & Plan Note (Signed)
A voluntary discussion about advance care planning including the explanation and discussion of advance directives was extensively discussed  with the patient.  Explanation about the health care proxy and Living will was reviewed and packet with forms with explanation of how to fill them out was given.not done yet.

## 2016-11-29 NOTE — Progress Notes (Signed)
BP 132/78   Pulse 60   Wt 170 lb 9.6 oz (77.4 kg)   SpO2 98%   BMI 26.72 kg/m    Subjective:    Patient ID: Duane Edelman., male    DOB: [birth year], 76 y.o.   MRN: [id number]  HPI: Duane Fergusson. is a 76 y.o. male  Chief Complaint  Patient presents with  . Annual Exam  Diabetes all in all doing well changing to extended release this helped has only occasional nausea now otherwise doing well. No low blood sugar spells. Blood pressure cholesterol also doing well with no issues or concerns. BPH also doing well no complaints or concerns.   Relevant past medical, surgical, family and social history reviewed and updated as indicated. Interim medical history since our last visit reviewed. Allergies and medications reviewed and updated.  Review of Systems  Constitutional: Negative.   HENT: Negative.   Eyes: Negative.   Respiratory: Negative.   Cardiovascular: Negative.   Gastrointestinal: Negative.   Endocrine: Negative.   Genitourinary: Negative.   Musculoskeletal: Negative.   Skin: Negative.   Allergic/Immunologic: Negative.   Neurological: Negative.   Hematological: Negative.   Psychiatric/Behavioral: Negative.     Per HPI unless specifically indicated above     Objective:    BP 132/78   Pulse 60   Wt 170 lb 9.6 oz (77.4 kg)   SpO2 98%   BMI 26.72 kg/m   Wt Readings from Last 3 Encounters:  11/29/16 170 lb 9.6 oz (77.4 kg)  11/24/16 170 lb 8 oz (77.3 kg)  08/24/16 170 lb (77.1 kg)    Physical Exam  Constitutional: He is oriented to person, place, and time. He appears well-developed and well-nourished.  HENT:  Head: Normocephalic and atraumatic.  Right Ear: External ear normal.  Left Ear: External ear normal.  Eyes: Pupils are equal, round, and reactive to light. Conjunctivae and EOM are normal.  Neck: Normal range of motion. Neck supple.  Cardiovascular: Normal rate, regular rhythm, normal heart sounds and intact distal pulses.     Pulmonary/Chest: Effort normal and breath sounds normal.  Abdominal: Soft. Bowel sounds are normal. There is no splenomegaly or hepatomegaly.  Genitourinary: Rectum normal, prostate normal and penis normal.  Musculoskeletal: Normal range of motion.  Neurological: He is alert and oriented to person, place, and time. He has normal reflexes.  Skin: No rash noted. No erythema.  Psychiatric: He has a normal mood and affect. His behavior is normal. Judgment and thought content normal.    Results for orders placed or performed in visit on 08/24/16  Bayer DCA Hb A1c Waived  Result Value Ref Range   Bayer DCA Hb A1c Waived 6.0 <7.0 %      Assessment & Plan:   Problem List Items Addressed This Visit      Cardiovascular and Mediastinum   Hypertension - Primary    The current medical regimen is effective;  continue present plan and medications.       Relevant Medications   atorvastatin (LIPITOR) 20 MG tablet   carvedilol (COREG) 25 MG tablet   Choline Fenofibrate (FENOFIBRIC ACID) 135 MG CPDR   hydrochlorothiazide (MICROZIDE) 12.5 MG capsule   quinapril (ACCUPRIL) 40 MG tablet   Other Relevant Orders   CBC with Differential/Platelet   Bayer DCA Hb A1c Waived     Endocrine   Diabetes mellitus ([place])    The current medical regimen is effective;  continue present plan and medications.  Relevant Medications   atorvastatin (LIPITOR) 20 MG tablet   metFORMIN (GLUCOPHAGE XR) 500 MG 24 hr tablet   quinapril (ACCUPRIL) 40 MG tablet   Other Relevant Orders   Comprehensive metabolic panel   Urinalysis, Routine w reflex microscopic   Bayer DCA Hb A1c Waived     Genitourinary   Malignant neoplasm of prostate ([facility]CC)    The current medical regimen is effective;  continue present plan and medications.       Relevant Medications   tamsulosin (FLOMAX) 0.4 MG CAPS capsule     Other   Hyperlipidemia   Relevant Medications   atorvastatin (LIPITOR) 20 MG tablet   carvedilol (COREG)  25 MG tablet   Choline Fenofibrate (FENOFIBRIC ACID) 135 MG CPDR   hydrochlorothiazide (MICROZIDE) 12.5 MG capsule   quinapril (ACCUPRIL) 40 MG tablet   Other Relevant Orders   Lipid panel   Bayer DCA Hb A1c Waived   Advanced care planning/counseling discussion    A voluntary discussion about advance care planning including the explanation and discussion of advance directives was extensively discussed  with the patient.  Explanation about the health care proxy and Living will was reviewed and packet with forms with explanation of how to fill them out was given.not done yet.         Other Visit Diagnoses    Prostate cancer screening       Thyroid disorder screen       Relevant Orders   TSH       Follow up plan: Return in about 6 months (around 06/01/2017) for Hemoglobin A1c, BMP,  Lipids, ALT, AST.

## 2016-11-30 ENCOUNTER — Encounter: Payer: Self-pay | Admitting: Family Medicine

## 2016-11-30 LAB — COMPREHENSIVE METABOLIC PANEL
A/G RATIO: 2.9 — AB (ref 1.2–2.2)
ALBUMIN: 4.6 g/dL (ref 3.5–4.8)
ALK PHOS: 34 IU/L — AB (ref 39–117)
ALT: 31 IU/L (ref 0–44)
AST: 22 IU/L (ref 0–40)
BILIRUBIN TOTAL: 0.2 mg/dL (ref 0.0–1.2)
BUN / CREAT RATIO: 12 (ref 10–24)
BUN: 16 mg/dL (ref 8–27)
CHLORIDE: 106 mmol/L (ref 96–106)
CO2: 21 mmol/L (ref 20–29)
Calcium: 9.1 mg/dL (ref 8.6–10.2)
Creatinine, Ser: 1.32 mg/dL — ABNORMAL HIGH (ref 0.76–1.27)
GFR calc Af Amer: 61 mL/min/{1.73_m2} (ref >59)
GFR calc non Af Amer: 53 mL/min/{1.73_m2} — ABNORMAL LOW (ref >59)
GLOBULIN, TOTAL: 1.6 g/dL (ref 1.5–4.5)
Glucose: 96 mg/dL (ref 65–99)
POTASSIUM: 4.5 mmol/L (ref 3.5–5.2)
SODIUM: 143 mmol/L (ref 134–144)
Total Protein: 6.2 g/dL (ref 6.0–8.5)

## 2016-11-30 LAB — CBC WITH DIFFERENTIAL/PLATELET
Basophils Absolute: 0 10*3/uL (ref 0.0–0.2)
Basos: 0 %
EOS (ABSOLUTE): 0.1 10*3/uL (ref 0.0–0.4)
EOS: 2 %
HEMATOCRIT: 36.3 % — AB (ref 37.5–51.0)
HEMOGLOBIN: 11.8 g/dL — AB (ref 13.0–17.7)
Immature Grans (Abs): 0 10*3/uL (ref 0.0–0.1)
Immature Granulocytes: 0 %
LYMPHS ABS: 2.5 10*3/uL (ref 0.7–3.1)
LYMPHS: 40 %
MCH: 28.4 pg (ref 26.6–33.0)
MCHC: 32.5 g/dL (ref 31.5–35.7)
MCV: 87 fL (ref 79–97)
MONOCYTES: 4 %
MONOS ABS: 0.3 10*3/uL (ref 0.1–0.9)
NEUTROS ABS: 3.4 10*3/uL (ref 1.4–7.0)
NEUTROS PCT: 54 %
Platelets: 224 10*3/uL (ref 150–379)
RBC: 4.16 x10E6/uL (ref 4.14–5.80)
RDW: 15.5 % — AB (ref 12.3–15.4)
WBC: 6.3 10*3/uL (ref 3.4–10.8)

## 2016-11-30 LAB — URINALYSIS, ROUTINE W REFLEX MICROSCOPIC
BILIRUBIN UA: NEGATIVE
Glucose, UA: NEGATIVE
Ketones, UA: NEGATIVE
LEUKOCYTES UA: NEGATIVE
Nitrite, UA: NEGATIVE
PROTEIN UA: NEGATIVE
RBC, UA: NEGATIVE
Specific Gravity, UA: 1.02 (ref 1.005–1.030)
Urobilinogen, Ur: 0.2 mg/dL (ref 0.2–1.0)
pH, UA: 6 (ref 5.0–7.5)

## 2016-11-30 LAB — LIPID PANEL
CHOLESTEROL TOTAL: 148 mg/dL (ref 100–199)
Chol/HDL Ratio: 5.3 ratio — ABNORMAL HIGH (ref 0.0–5.0)
HDL: 28 mg/dL — ABNORMAL LOW (ref >39)
LDL CALC: 62 mg/dL (ref 0–99)
TRIGLYCERIDES: 290 mg/dL — AB (ref 0–149)
VLDL Cholesterol Cal: 58 mg/dL — ABNORMAL HIGH (ref 5–40)

## 2016-11-30 LAB — BAYER DCA HB A1C WAIVED: HB A1C: 6.4 % (ref <7.0)

## 2016-11-30 LAB — TSH: TSH: 3.51 u[IU]/mL (ref 0.450–4.500)

## 2017-01-25 ENCOUNTER — Ambulatory Visit (INDEPENDENT_AMBULATORY_CARE_PROVIDER_SITE_OTHER): Payer: Medicare Other

## 2017-01-25 DIAGNOSIS — Z23 Encounter for immunization: Secondary | ICD-10-CM

## 2017-03-08 ENCOUNTER — Ambulatory Visit: Payer: Medicare Other | Admitting: Family Medicine

## 2017-03-15 ENCOUNTER — Telehealth: Payer: Self-pay | Admitting: Family Medicine

## 2017-03-15 NOTE — Telephone Encounter (Signed)
Please advise 

## 2017-03-15 NOTE — Telephone Encounter (Signed)
Message relayed to patient. Verbalized understanding and denied questions.  At front desk awaiting pickup. Copy made for scan box.

## 2017-03-15 NOTE — Telephone Encounter (Signed)
Patient dropped off copy of [place] Duty letter to get Dr Jeananne Rama to please give him a letter of excuse due to the fact that he has health issues that cause him to have to urinate frequently, etc.  Please call once complete  [phone]  Thanks

## 2017-03-15 NOTE — Telephone Encounter (Signed)
done

## 2017-06-03 DIAGNOSIS — E119 Type 2 diabetes mellitus without complications: Secondary | ICD-10-CM | POA: Diagnosis not present

## 2017-06-03 DIAGNOSIS — H2513 Age-related nuclear cataract, bilateral: Secondary | ICD-10-CM | POA: Diagnosis not present

## 2017-06-03 LAB — HM DIABETES EYE EXAM

## 2017-06-09 ENCOUNTER — Ambulatory Visit (INDEPENDENT_AMBULATORY_CARE_PROVIDER_SITE_OTHER): Payer: Medicare Other | Admitting: Family Medicine

## 2017-06-09 ENCOUNTER — Encounter: Payer: Self-pay | Admitting: Family Medicine

## 2017-06-09 VITALS — BP 135/74 | HR 112 | Wt 175.0 lb

## 2017-06-09 DIAGNOSIS — N3040 Irradiation cystitis without hematuria: Secondary | ICD-10-CM | POA: Diagnosis not present

## 2017-06-09 DIAGNOSIS — D649 Anemia, unspecified: Secondary | ICD-10-CM

## 2017-06-09 DIAGNOSIS — I10 Essential (primary) hypertension: Secondary | ICD-10-CM

## 2017-06-09 DIAGNOSIS — E7800 Pure hypercholesterolemia, unspecified: Secondary | ICD-10-CM | POA: Diagnosis not present

## 2017-06-09 DIAGNOSIS — K219 Gastro-esophageal reflux disease without esophagitis: Secondary | ICD-10-CM | POA: Diagnosis not present

## 2017-06-09 DIAGNOSIS — E119 Type 2 diabetes mellitus without complications: Secondary | ICD-10-CM | POA: Diagnosis not present

## 2017-06-09 LAB — BAYER DCA HB A1C WAIVED: HB A1C (BAYER DCA - WAIVED): 6.3 % (ref <7.0)

## 2017-06-09 MED ORDER — OMEPRAZOLE 40 MG PO CPDR: 40.0000 mg | DELAYED_RELEASE_CAPSULE | Freq: Every day | ORAL | 2 refills | Status: DC

## 2017-06-09 NOTE — Assessment & Plan Note (Signed)
Patient with worsening reflux symptoms discuss propping his bed up which she is already done sleeping on his right side avoidance of typical foods that cause reflux. May increase Prilosec from 20 to 40 mg will give a new prescription. Patient also due for colonoscopy and endoscopy later this year will consider spitting up if persistent symptoms.

## 2017-06-09 NOTE — Assessment & Plan Note (Signed)
Has intermittent bleeding

## 2017-06-09 NOTE — Progress Notes (Signed)
BP 135/74   Pulse (!) 112   Wt 175 lb (79.4 kg)   SpO2 95%   BMI 27.41 kg/m    Subjective:    Patient ID: Duane Edelman., male    DOB: Feb 09, 1941, 77 y.o.   MRN: [id number]  HPI: Duane Holte. is a 77 y.o. male  Patient for follow-up medical problems doing well. Has continued to notice slight hematuria occasionally at the end of urination will have a slight drop of blood come out to his penis this is been ongoing since his hematuria episodes 2 years ago. Has not been back to urology. Is bothered by reflux wakes up at night to 3 AM with marked heartburn takes Prilosec about 7 PM. Patient does drink wine in the evening and notices especially if has spicy red stuff. Blood pressure diabetes doing well no complaints. Lipids lipemic blood work will send to outside lab.  Also has been taking fenofibric acid.  Relevant past medical, surgical, family and social history reviewed and updated as indicated. Interim medical history since our last visit reviewed. Allergies and medications reviewed and updated.  Review of Systems  Constitutional: Negative.   Respiratory: Negative.   Cardiovascular: Negative.     Per HPI unless specifically indicated above     Objective:    BP 135/74   Pulse (!) 112   Wt 175 lb (79.4 kg)   SpO2 95%   BMI 27.41 kg/m   Wt Readings from Last 3 Encounters:  06/09/17 175 lb (79.4 kg)  11/29/16 170 lb 9.6 oz (77.4 kg)  11/24/16 170 lb 8 oz (77.3 kg)    Physical Exam  Constitutional: He is oriented to person, place, and time. He appears well-developed and well-nourished.  HENT:  Head: Normocephalic and atraumatic.  Eyes: Conjunctivae and EOM are normal.  Neck: Normal range of motion.  Cardiovascular: Normal rate, regular rhythm and normal heart sounds.  Pulmonary/Chest: Effort normal and breath sounds normal.  Abdominal: Soft. Bowel sounds are normal. He exhibits no distension and no mass. There is no tenderness. There is no rebound and no  guarding.  Musculoskeletal: Normal range of motion.  Neurological: He is alert and oriented to person, place, and time.  Skin: No erythema.  Psychiatric: He has a normal mood and affect. His behavior is normal. Judgment and thought content normal.    Results for orders placed or performed in visit on 06/09/17  HM DIABETES EYE EXAM  Result Value Ref Range   HM Diabetic Eye Exam No Retinopathy No Retinopathy      Assessment & Plan:   Problem List Items Addressed This Visit      Cardiovascular and Mediastinum   Hypertension - Primary    The current medical regimen is effective;  continue present plan and medications.       Relevant Orders   Bayer DCA Hb A1c Waived   Basic metabolic panel   LP+ALT+AST Piccolo, Waived     Digestive   GERD (gastroesophageal reflux disease)    Patient with worsening reflux symptoms discuss propping his bed up which she is already done sleeping on his right side avoidance of typical foods that cause reflux. May increase Prilosec from 20 to 40 mg will give a new prescription. Patient also due for colonoscopy and endoscopy later this year will consider spitting up if persistent symptoms.      Relevant Medications   omeprazole (PRILOSEC) 40 MG capsule     Endocrine   Diabetes mellitus (  [place])    The current medical regimen is effective;  continue present plan and medications.       Relevant Orders   Bayer DCA Hb A1c Waived   Basic metabolic panel   LP+ALT+AST Piccolo, Waived     Genitourinary   Chronic radiation cystitis    Has intermittent bleeding        Other   Hyperlipidemia    Triglycerides elevated waiting labs that were sent out.      Relevant Orders   Bayer DCA Hb A1c Waived   Basic metabolic panel   LP+ALT+AST Piccolo, Waived   ALT   AST   Lipid panel    Other Visit Diagnoses    Anemia, unspecified type       Relevant Orders   CBC with Differential/Platelet       Follow up plan: Return in about 6 months (around  12/07/2017) for Physical Exam.

## 2017-06-09 NOTE — Assessment & Plan Note (Signed)
Triglycerides elevated waiting labs that were sent out.

## 2017-06-09 NOTE — Assessment & Plan Note (Signed)
The current medical regimen is effective;  continue present plan and medications.  

## 2017-06-10 ENCOUNTER — Encounter: Payer: Self-pay | Admitting: Family Medicine

## 2017-06-10 LAB — LIPID PANEL
CHOLESTEROL TOTAL: 142 mg/dL (ref 100–199)
Chol/HDL Ratio: 6.2 ratio — ABNORMAL HIGH (ref 0.0–5.0)
HDL: 23 mg/dL — AB (ref >39)
LDL CALC: 62 mg/dL (ref 0–99)
TRIGLYCERIDES: 284 mg/dL — AB (ref 0–149)
VLDL CHOLESTEROL CAL: 57 mg/dL — AB (ref 5–40)

## 2017-06-10 LAB — CBC WITH DIFFERENTIAL/PLATELET
Basophils Absolute: 0 10*3/uL (ref 0.0–0.2)
Basos: 1 %
EOS (ABSOLUTE): 0.2 10*3/uL (ref 0.0–0.4)
EOS: 2 %
HEMATOCRIT: 37.9 % (ref 37.5–51.0)
HEMOGLOBIN: 12.2 g/dL — AB (ref 13.0–17.7)
IMMATURE GRANS (ABS): 0 10*3/uL (ref 0.0–0.1)
IMMATURE GRANULOCYTES: 1 %
Lymphocytes Absolute: 2.5 10*3/uL (ref 0.7–3.1)
Lymphs: 34 %
MCH: 27.7 pg (ref 26.6–33.0)
MCHC: 32.2 g/dL (ref 31.5–35.7)
MCV: 86 fL (ref 79–97)
MONOCYTES: 3 %
Monocytes Absolute: 0.3 10*3/uL (ref 0.1–0.9)
NEUTROS PCT: 59 %
Neutrophils Absolute: 4.5 10*3/uL (ref 1.4–7.0)
Platelets: 266 10*3/uL (ref 150–379)
RBC: 4.4 x10E6/uL (ref 4.14–5.80)
RDW: 15.2 % (ref 12.3–15.4)
WBC: 7.4 10*3/uL (ref 3.4–10.8)

## 2017-06-10 LAB — AST: AST: 21 IU/L (ref 0–40)

## 2017-06-10 LAB — ALT: ALT: 25 IU/L (ref 0–44)

## 2017-06-11 LAB — BASIC METABOLIC PANEL
BUN/Creatinine Ratio: 12 (ref 10–24)
BUN: 15 mg/dL (ref 8–27)
CO2: 15 mmol/L — AB (ref 20–29)
Calcium: 9.1 mg/dL (ref 8.6–10.2)
Chloride: 100 mmol/L (ref 96–106)
Creatinine, Ser: 1.25 mg/dL (ref 0.76–1.27)
GFR calc Af Amer: 65 mL/min/{1.73_m2} (ref >59)
GFR calc non Af Amer: 56 mL/min/{1.73_m2} — ABNORMAL LOW (ref >59)
GLUCOSE: 50 mg/dL — AB (ref 65–99)
POTASSIUM: 4.2 mmol/L (ref 3.5–5.2)
SODIUM: 141 mmol/L (ref 134–144)

## 2017-09-27 ENCOUNTER — Other Ambulatory Visit: Payer: Self-pay | Admitting: Family Medicine

## 2017-10-11 ENCOUNTER — Encounter: Payer: Self-pay | Admitting: Family Medicine

## 2017-11-28 ENCOUNTER — Other Ambulatory Visit: Payer: Self-pay | Admitting: Family Medicine

## 2017-11-28 DIAGNOSIS — I10 Essential (primary) hypertension: Secondary | ICD-10-CM

## 2017-11-30 DIAGNOSIS — E119 Type 2 diabetes mellitus without complications: Secondary | ICD-10-CM | POA: Diagnosis not present

## 2017-11-30 DIAGNOSIS — H353131 Nonexudative age-related macular degeneration, bilateral, early dry stage: Secondary | ICD-10-CM | POA: Diagnosis not present

## 2017-11-30 DIAGNOSIS — H2513 Age-related nuclear cataract, bilateral: Secondary | ICD-10-CM | POA: Diagnosis not present

## 2017-11-30 LAB — HM DIABETES EYE EXAM

## 2017-12-01 ENCOUNTER — Ambulatory Visit (INDEPENDENT_AMBULATORY_CARE_PROVIDER_SITE_OTHER): Payer: Medicare Other

## 2017-12-01 VITALS — BP 132/70 | HR 55 | Temp 98.0°F | Resp 16 | Ht 66.0 in | Wt 168.4 lb

## 2017-12-01 DIAGNOSIS — Z0000 Encounter for general adult medical examination without abnormal findings: Secondary | ICD-10-CM

## 2017-12-01 NOTE — Progress Notes (Signed)
Subjective:   Duane Hill. is a 77 y.o. male who presents for Medicare Annual/Subsequent preventive examination.  Review of Systems:   Cardiac Risk Factors include: advanced age (>72men, >57 women);hypertension;dyslipidemia;diabetes mellitus;male gender;smoking/ tobacco exposure     Objective:    Vitals: BP 132/70 (BP Location: Left Arm, Patient Position: Sitting)   Pulse (!) 55   Temp 98 F (36.7 C) (Temporal)   Resp 16   Ht 5\' 6"  (1.676 m)   Wt 168 lb 6.4 oz (76.4 kg)   BMI 27.18 kg/m   Body mass index is 27.18 kg/m.  Advanced Directives 12/01/2017 11/24/2016  Does Patient Have a Medical Advance Directive? Yes No  Type of Advance Directive [place] in Chart? No - copy requested -  Would patient like information on creating a medical advance directive? - Yes (MAU/Ambulatory/Procedural Areas - Information given)    Tobacco Social History   Tobacco Use  Smoking Status Former Smoker  . Types: Cigarettes  . Last attempt to quit: 11/28/1986  . Years since quitting: 31.0  Smokeless Tobacco Never Used     Counseling given: Not Answered   Clinical Intake:  Pre-visit preparation completed: Yes  Pain : No/denies pain     Nutritional Status: BMI 25 -29 Overweight Nutritional Risks: None, Nausea/ vomitting/ diarrhea(nausea in the morning after taking medications ) Diabetes: Yes CBG done?: No Did pt. bring in CBG monitor from home?: No  How often do you need to have someone help you when you read instructions, pamphlets, or other written materials from your doctor or pharmacy?: 1 - Never What is the last grade level you completed in school?: 12th grade   Interpreter Needed?: No  Information entered by :: Duane Breton,LPN   Past Medical History:  Diagnosis Date  . Arthritis   . Asthma    Inhaler as needed  . Dupuytren contracture   . GERD (gastroesophageal reflux disease)   . Hyperlipidemia   .  Hypertension   . Radiation proctitis   . Renal insufficiency    Past Surgical History:  Procedure Laterality Date  . PROSTATE SURGERY     Family History  Problem Relation Age of Onset  . Heart attack Mother   . Hypertension Father   . Stroke Father   . Cancer Father   . Diabetes Father   . Heart attack Father    Social History   Socioeconomic History  . Marital status: Divorced    Spouse name: Not on file  . Number of children: Not on file  . Years of education: Not on file  . Highest education level: High school graduate  Occupational History  . Not on file  Social Needs  . Financial resource strain: Not very hard  . Food insecurity:    Worry: Never true    Inability: Never true  . Transportation needs:    Medical: No    Non-medical: Yes  Tobacco Use  . Smoking status: Former Smoker    Types: Cigarettes    Last attempt to quit: 11/28/1986    Years since quitting: 31.0  . Smokeless tobacco: Never Used  Substance and Sexual Activity  . Alcohol use: Yes    Alcohol/week: 7.0 standard drinks    Types: 7 Glasses of wine per week    Comment: 1 glass of wine at night  . Drug use: No  . Sexual activity: Not on file  Lifestyle  . Physical  activity:    Days per week: 0 days    Minutes per session: 0 min  . Stress: Not at all  Relationships  . Social connections:    Talks on phone: Once a week    Gets together: Three times a week    Attends religious service: Never    Active member of club or organization: No    Attends meetings of clubs or organizations: Never    Relationship status: Divorced  Other Topics Concern  . Not on file  Social History Narrative   Retired from city     Outpatient Encounter Medications as of 12/01/2017  Medication Sig  . acetaminophen (TYLENOL) 500 MG tablet Take 1,000 mg by mouth every 6 (six) hours as needed. Patient uss Caplet extra strength  . albuterol (PROVENTIL HFA;VENTOLIN HFA) 108 (90 BASE) MCG/ACT inhaler Inhale 2 puffs into  the lungs every 6 (six) hours as needed for wheezing or shortness of breath.  Marland Kitchen atorvastatin (LIPITOR) 20 MG tablet Take 1 tablet (20 mg total) by mouth daily.  Marland Kitchen azelastine (ASTELIN) 0.1 % nasal spray Place 2 sprays into both nostrils 2 (two) times daily.  . carvedilol (COREG) 25 MG tablet Take 1 tablet (25 mg total) by mouth 2 (two) times daily with a meal.  . Choline Fenofibrate (FENOFIBRIC ACID) 135 MG CPDR Take 135 mg by mouth daily.  . famciclovir (FAMVIR) 500 MG tablet TAKE ONE TABLET THREE TIMES A DAY.  Marland Kitchen gabapentin (NEURONTIN) 300 MG capsule Take 2 capsules (600 mg total) by mouth 2 (two) times daily.  Marland Kitchen glucose blood test strip CHECK BLOOD SUGAR TWICE DAILY.  . hydrochlorothiazide (MICROZIDE) 12.5 MG capsule Take 1 capsule (12.5 mg total) by mouth daily.  . metFORMIN (GLUCOPHAGE XR) 500 MG 24 hr tablet Take 2 tablets (1,000 mg total) by mouth 2 (two) times daily.  Marland Kitchen MICROLET LANCETS MISC USE TWICE DAILY AS DIRECTED.  Marland Kitchen omeprazole (PRILOSEC) 40 MG capsule Take 1 capsule (40 mg total) by mouth daily.  . quinapril (ACCUPRIL) 40 MG tablet Take 1 tablet (40 mg total) by mouth daily.  . tamsulosin (FLOMAX) 0.4 MG CAPS capsule Take 1 capsule (0.4 mg total) by mouth daily.   No facility-administered encounter medications on file as of 12/01/2017.     Activities of Daily Living In your present state of health, do you have any difficulty performing the following activities: 12/01/2017  Hearing? Y  Vision? Y  Comment has cataract on left eye   Difficulty concentrating or making decisions? Y  Walking or climbing stairs? Y  Dressing or bathing? N  Doing errands, shopping? Y  Comment walks to a lot of places, uses sisters car when needed  Preparing Food and eating ? N  Using the Toilet? N  In the past six months, have you accidently leaked urine? N  Do you have problems with loss of bowel control? N  Managing your Medications? N  Managing your Finances? N  Housekeeping or managing your  Housekeeping? N  Some recent data might be hidden    Patient Care Team: Duane Maple, MD as PCP - General (Family Medicine) Duane Espy, MD as Consulting Physician (Urology)   Assessment:   This is a routine wellness examination for Duane Hill.  Exercise Activities and Dietary recommendations Current Exercise Habits: The patient does not participate in regular exercise at present(walks around [place] ), Exercise limited by: None identified  Goals    . DIET - INCREASE WATER INTAKE     Recommend  continue drinking at least 6-8 glasses of water a day        Fall Risk Fall Risk  12/01/2017 06/09/2017 11/29/2016 11/24/2016 08/24/2016  Falls in the past year? No No No No No   Is the patient's home free of loose throw rugs in walkways, pet beds, electrical cords, etc?   yes      Grab bars in the bathroom? yes      Handrails on the stairs?   yes      Adequate lighting?   yes  Timed Get Up and Go Performed: Completed in 8 seconds with no use of assistive devices, steady gait. No intervention needed at this time.   Depression Screen PHQ 2/9 Scores 12/01/2017 11/24/2016 11/17/2015 11/05/2014  PHQ - 2 Score 0 3 0 0  PHQ- 9 Score - 3 - -    Cognitive Function     6CIT Screen 12/01/2017 11/24/2016  What Year? 0 points 0 points  What month? 0 points 0 points  What time? 0 points 0 points  Count back from 20 0 points 0 points  Months in reverse 0 points 0 points  Repeat phrase 0 points 0 points  Total Score 0 0    Immunization History  Administered Date(s) Administered  . Influenza, High Dose Seasonal PF 02/24/2016, 01/25/2017  . Influenza,inj,Quad PF,6+ Mos 02/05/2015  . Pneumococcal Conjugate-13 10/18/2013  . Pneumococcal Polysaccharide-23 02/24/2016  . Pneumococcal-Unspecified 01/06/2009  . Tdap 07/20/2011  . Zoster 01/07/2009    Qualifies for Shingles Vaccine? Yes, discussed shingrix vaccine    Screening Tests Health Maintenance  Topic Date Due  . INFLUENZA VACCINE  11/17/2017   . HEMOGLOBIN A1C  12/07/2017  . COLONOSCOPY  01/22/2018  . OPHTHALMOLOGY EXAM  06/03/2018  . FOOT EXAM  06/09/2018  . TETANUS/TDAP  07/19/2021  . PNA vac Low Risk Adult  Completed   Cancer Screenings: Lung: Low Dose CT Chest recommended if Age 19-80 years, 30 pack-year currently smoking OR have quit w/in 15years. Patient does not qualify. Colorectal: completed 01/22/2013  Additional Screenings:  Hepatitis C Screening: not indicated      Plan:    I have personally reviewed and addressed the Medicare Annual Wellness questionnaire and have noted the following in the patient's chart:  A. Medical and social history B. Use of alcohol, tobacco or illicit drugs  C. Current medications and supplements D. Functional ability and status E.  Nutritional status F.  Physical activity G. Advance directives H. List of other physicians I.  Hospitalizations, surgeries, and ER visits in previous 12 months J.  [place] such as hearing and vision if needed, cognitive and depression L. Referrals and appointments   In addition, I have reviewed and discussed with patient certain preventive protocols, quality metrics, and best practice recommendations. A written personalized care plan for preventive services as well as general preventive health recommendations were provided to patient.   Signed,  Tyler Aas, LPN Nurse Health Advisor   Nurse Notes:none

## 2017-12-01 NOTE — Patient Instructions (Signed)
Duane Hill , Thank you for taking time to come for your Medicare Wellness Visit. I appreciate your ongoing commitment to your health goals. Please review the following plan we discussed and let me know if I can assist you in the future.   Screening recommendations/referrals: Colonoscopy: completed 01/22/2013 Recommended yearly ophthalmology/optometry visit for glaucoma screening and checkup Recommended yearly dental visit for hygiene and checkup  Vaccinations: Influenza vaccine: due 12/2017 Pneumococcal vaccine: completed series Tdap vaccine: up to date Shingles vaccine: shingrix eligible, check with your insurance company for coverage     Advanced directives: Advance directive discussed with you today. I have provided a copy for you to complete at home and have notarized. Once this is complete please bring a copy in to our office so we can scan it into your chart.  Conditions/risks identified: Recommend continue drinking at least 6-8 glasses of water a day   Next appointment: Follow up on 02/20/2018 at 1:00pm with Dr.Crissman. Follow up in one year for your annual wellness exam.   Preventive Care 65 Years and Older, Male Preventive care refers to lifestyle choices and visits with your health care provider that can promote health and wellness. What does preventive care include?  A yearly physical exam. This is also called an annual well check.  Dental exams once or twice a year.  Routine eye exams. Ask your health care provider how often you should have your eyes checked.  Personal lifestyle choices, including:  Daily care of your teeth and gums.  Regular physical activity.  Eating a healthy diet.  Avoiding tobacco and drug use.  Limiting alcohol use.  Practicing safe sex.  Taking low doses of aspirin every day.  Taking vitamin and mineral supplements as recommended by your health care provider. What happens during an annual well check? The services and screenings done  by your health care provider during your annual well check will depend on your age, overall health, lifestyle risk factors, and family history of disease. Counseling  Your health care provider may ask you questions about your:  Alcohol use.  Tobacco use.  Drug use.  Emotional well-being.  Home and relationship well-being.  Sexual activity.  Eating habits.  History of falls.  Memory and ability to understand (cognition).  Work and work Statistician. Screening  You may have the following tests or measurements:  Height, weight, and BMI.  Blood pressure.  Lipid and cholesterol levels. These may be checked every 5 years, or more frequently if you are over 75 years old.  Skin check.  Lung cancer screening. You may have this screening every year starting at age 75 if you have a 30-pack-year history of smoking and currently smoke or have quit within the past 15 years.  Fecal occult blood test (FOBT) of the stool. You may have this test every year starting at age 75.  Flexible sigmoidoscopy or colonoscopy. You may have a sigmoidoscopy every 5 years or a colonoscopy every 10 years starting at age 75.  Prostate cancer screening. Recommendations will vary depending on your family history and other risks.  Hepatitis C blood test.  Hepatitis B blood test.  Sexually transmitted disease (STD) testing.  Diabetes screening. This is done by checking your blood sugar (glucose) after you have not eaten for a while (fasting). You may have this done every 1-3 years.  Abdominal aortic aneurysm (AAA) screening. You may need this if you are a current or former smoker.  Osteoporosis. You may be screened starting at age 75  if you are at high risk. Talk with your health care provider about your test results, treatment options, and if necessary, the need for more tests. Vaccines  Your health care provider may recommend certain vaccines, such as:  Influenza vaccine. This is recommended every  year.  Tetanus, diphtheria, and acellular pertussis (Tdap, Td) vaccine. You may need a Td booster every 10 years.  Zoster vaccine. You may need this after age 75.  Pneumococcal 13-valent conjugate (PCV13) vaccine. One dose is recommended after age 75.  Pneumococcal polysaccharide (PPSV23) vaccine. One dose is recommended after age 75. Talk to your health care provider about which screenings and vaccines you need and how often you need them. This information is not intended to replace advice given to you by your health care provider. Make sure you discuss any questions you have with your health care provider. Document Released: 05/02/2015 Document Revised: 12/24/2015 Document Reviewed: 02/04/2015 Elsevier Interactive Patient Education  2017 [place] Prevention in the Home Falls can cause injuries. They can happen to people of all ages. There are many things you can do to make your home safe and to help prevent falls. What can I do on the outside of my home?  Regularly fix the edges of walkways and driveways and fix any cracks.  Remove anything that might make you trip as you walk through a door, such as a raised step or threshold.  Trim any bushes or trees on the path to your home.  Use bright outdoor lighting.  Clear any walking paths of anything that might make someone trip, such as rocks or tools.  Regularly check to see if handrails are loose or broken. Make sure that both sides of any steps have handrails.  Any raised decks and porches should have guardrails on the edges.  Have any leaves, snow, or ice cleared regularly.  Use sand or salt on walking paths during winter.  Clean up any spills in your garage right away. This includes oil or grease spills. What can I do in the bathroom?  Use night lights.  Install grab bars by the toilet and in the tub and shower. Do not use towel bars as grab bars.  Use non-skid mats or decals in the tub or shower.  If you  need to sit down in the shower, use a plastic, non-slip stool.  Keep the floor dry. Clean up any water that spills on the floor as soon as it happens.  Remove soap buildup in the tub or shower regularly.  Attach bath mats securely with double-sided non-slip rug tape.  Do not have throw rugs and other things on the floor that can make you trip. What can I do in the bedroom?  Use night lights.  Make sure that you have a light by your bed that is easy to reach.  Do not use any sheets or blankets that are too big for your bed. They should not hang down onto the floor.  Have a firm chair that has side arms. You can use this for support while you get dressed.  Do not have throw rugs and other things on the floor that can make you trip. What can I do in the kitchen?  Clean up any spills right away.  Avoid walking on wet floors.  Keep items that you use a lot in easy-to-reach places.  If you need to reach something above you, use a strong step stool that has a grab bar.  Keep electrical  cords out of the way.  Do not use floor polish or wax that makes floors slippery. If you must use wax, use non-skid floor wax.  Do not have throw rugs and other things on the floor that can make you trip. What can I do with my stairs?  Do not leave any items on the stairs.  Make sure that there are handrails on both sides of the stairs and use them. Fix handrails that are broken or loose. Make sure that handrails are as long as the stairways.  Check any carpeting to make sure that it is firmly attached to the stairs. Fix any carpet that is loose or worn.  Avoid having throw rugs at the top or bottom of the stairs. If you do have throw rugs, attach them to the floor with carpet tape.  Make sure that you have a light switch at the top of the stairs and the bottom of the stairs. If you do not have them, ask someone to add them for you. What else can I do to help prevent falls?  Wear shoes  that:  Do not have high heels.  Have rubber bottoms.  Are comfortable and fit you well.  Are closed at the toe. Do not wear sandals.  If you use a stepladder:  Make sure that it is fully opened. Do not climb a closed stepladder.  Make sure that both sides of the stepladder are locked into place.  Ask someone to hold it for you, if possible.  Clearly mark and make sure that you can see:  Any grab bars or handrails.  First and last steps.  Where the edge of each step is.  Use tools that help you move around (mobility aids) if they are needed. These include:  Canes.  Walkers.  Scooters.  Crutches.  Turn on the lights when you go into a dark area. Replace any light bulbs as soon as they burn out.  Set up your furniture so you have a clear path. Avoid moving your furniture around.  If any of your floors are uneven, fix them.  If there are any pets around you, be aware of where they are.  Review your medicines with your doctor. Some medicines can make you feel dizzy. This can increase your chance of falling. Ask your doctor what other things that you can do to help prevent falls. This information is not intended to replace advice given to you by your health care provider. Make sure you discuss any questions you have with your health care provider. Document Released: 01/30/2009 Document Revised: 09/11/2015 Document Reviewed: 05/10/2014 Elsevier Interactive Patient Education  2017 [employer].

## 2017-12-08 ENCOUNTER — Encounter: Payer: Medicare Other | Admitting: Family Medicine

## 2018-01-10 ENCOUNTER — Other Ambulatory Visit: Payer: Self-pay | Admitting: Family Medicine

## 2018-01-10 NOTE — Telephone Encounter (Signed)
Refill of Ventolin by historical provider  LOV 06/09/17 Dr. Virgil [facility]  [facility] [place]

## 2018-02-19 ENCOUNTER — Other Ambulatory Visit: Payer: Self-pay | Admitting: Family Medicine

## 2018-02-20 ENCOUNTER — Ambulatory Visit (INDEPENDENT_AMBULATORY_CARE_PROVIDER_SITE_OTHER): Payer: Medicare Other | Admitting: Family Medicine

## 2018-02-20 ENCOUNTER — Encounter: Payer: Self-pay | Admitting: Family Medicine

## 2018-02-20 VITALS — BP 139/61 | HR 51 | Temp 98.5°F | Ht 66.14 in | Wt 166.0 lb

## 2018-02-20 DIAGNOSIS — E7800 Pure hypercholesterolemia, unspecified: Secondary | ICD-10-CM | POA: Diagnosis not present

## 2018-02-20 DIAGNOSIS — Z125 Encounter for screening for malignant neoplasm of prostate: Secondary | ICD-10-CM

## 2018-02-20 DIAGNOSIS — K219 Gastro-esophageal reflux disease without esophagitis: Secondary | ICD-10-CM | POA: Diagnosis not present

## 2018-02-20 DIAGNOSIS — Z7189 Other specified counseling: Secondary | ICD-10-CM | POA: Diagnosis not present

## 2018-02-20 DIAGNOSIS — Z1329 Encounter for screening for other suspected endocrine disorder: Secondary | ICD-10-CM | POA: Diagnosis not present

## 2018-02-20 DIAGNOSIS — I10 Essential (primary) hypertension: Secondary | ICD-10-CM

## 2018-02-20 DIAGNOSIS — E785 Hyperlipidemia, unspecified: Secondary | ICD-10-CM

## 2018-02-20 DIAGNOSIS — E119 Type 2 diabetes mellitus without complications: Secondary | ICD-10-CM | POA: Diagnosis not present

## 2018-02-20 LAB — URINALYSIS, ROUTINE W REFLEX MICROSCOPIC
BILIRUBIN UA: NEGATIVE
Glucose, UA: NEGATIVE
Ketones, UA: NEGATIVE
LEUKOCYTES UA: NEGATIVE
Nitrite, UA: NEGATIVE
Protein, UA: NEGATIVE
Specific Gravity, UA: 1.02 (ref 1.005–1.030)
Urobilinogen, Ur: 0.2 mg/dL (ref 0.2–1.0)
pH, UA: 5 (ref 5.0–7.5)

## 2018-02-20 LAB — MICROSCOPIC EXAMINATION: Bacteria, UA: NONE SEEN

## 2018-02-20 LAB — BAYER DCA HB A1C WAIVED: HB A1C (BAYER DCA - WAIVED): 6.4 % (ref <7.0)

## 2018-02-20 MED ORDER — CARVEDILOL 25 MG PO TABS: 25.0000 mg | ORAL_TABLET | Freq: Two times a day (BID) | ORAL | 4 refills | Status: DC

## 2018-02-20 MED ORDER — QUINAPRIL HCL 40 MG PO TABS: 40.0000 mg | ORAL_TABLET | Freq: Every day | ORAL | 4 refills | Status: DC

## 2018-02-20 MED ORDER — HYDROCHLOROTHIAZIDE 12.5 MG PO CAPS: 12.5000 mg | ORAL_CAPSULE | Freq: Every day | ORAL | 4 refills | Status: DC

## 2018-02-20 MED ORDER — METFORMIN HCL ER 500 MG PO TB24: 500.0000 mg | ORAL_TABLET | Freq: Two times a day (BID) | ORAL | 4 refills | Status: DC

## 2018-02-20 MED ORDER — GABAPENTIN 300 MG PO CAPS: 600.0000 mg | ORAL_CAPSULE | Freq: Two times a day (BID) | ORAL | 4 refills | Status: DC

## 2018-02-20 MED ORDER — OMEPRAZOLE 40 MG PO CPDR: 40.0000 mg | DELAYED_RELEASE_CAPSULE | Freq: Every day | ORAL | 4 refills | Status: DC

## 2018-02-20 MED ORDER — ATORVASTATIN CALCIUM 20 MG PO TABS: 20.0000 mg | ORAL_TABLET | Freq: Every day | ORAL | 4 refills | Status: DC

## 2018-02-20 NOTE — Assessment & Plan Note (Signed)
The current medical regimen is effective;  continue present plan and medications.  

## 2018-02-20 NOTE — Assessment & Plan Note (Signed)
A voluntary discussion about advanced care planning including explanation and discussion of advanced directives was extentively discussed with the patient.  Explained about the healthcare proxy and living will was reviewed and packet with forms with expiration of how to fill them out was given.  Time spent: Encounter 16+ min individuals present: Patient 

## 2018-02-20 NOTE — Assessment & Plan Note (Signed)
Discussed diabetes and diarrhea will cut metformin back from 1000 twice daily to 500 twice daily and observe response if unsatisfactory will consider other medications.

## 2018-02-20 NOTE — Assessment & Plan Note (Signed)
The current medical regimen is effective;  continue present plan and medications. a 

## 2018-02-20 NOTE — Telephone Encounter (Signed)
Requested Prescriptions  Refused Prescriptions Disp Refills  . metFORMIN (GLUCOPHAGE-XR) 500 MG 24 hr tablet [Pharmacy Med Name: METFORMIN HCL ER 500 MG TABLET] 360 tablet 0    Sig: Take 2 tablets (1,000 mg total) by mouth 2 (two) times daily.     Endocrinology:  Diabetes - Biguanides Failed - 02/19/2018  3:11 PM      Failed - HBA1C is between 0 and 7.9 and within 180 days    Hemoglobin A1C  Date Value Ref Range Status  08/15/2014 6.7 (H) % Final    Comment:    4.0-6.0 NOTE: New Reference Range  06/25/14          Passed - Cr in normal range and within 360 days    Creatinine  Date Value Ref Range Status  08/16/2014 1.11 mg/dL Final    Comment:    0.61-1.24 NOTE: New Reference Range  06/25/14    Creatinine, Ser  Date Value Ref Range Status  06/09/2017 1.25 0.76 - 1.27 mg/dL Final         Passed - eGFR in normal range and within 360 days    EGFR (African American)  Date Value Ref Range Status  08/16/2014 >60  Final   GFR calc Af Amer  Date Value Ref Range Status  06/09/2017 65 >59 mL/min/1.73 Final   EGFR (Non-African Amer.)  Date Value Ref Range Status  08/16/2014 >60  Final    Comment:    eGFR values <10m/min/1.73 m2 may be an indication of chronic kidney disease (CKD). Calculated eGFR is useful in patients with stable renal function. The eGFR calculation will not be reliable in acutely ill patients when serum creatinine is changing rapidly. It is not useful in patients on dialysis. The eGFR calculation may not be applicable to patients at the low and high extremes of body sizes, pregnant women, and vegetarians.    GFR calc non Af Amer  Date Value Ref Range Status  06/09/2017 56 (L) >59 mL/min/1.73 Final         Passed - Valid encounter within last 6 months    Recent Outpatient Visits          Today Essential hypertension   [facility] Crissman, MJeannette How MD   8 months ago Essential hypertension   [facility]  [facility]   1 year ago Essential hypertension   [facility] Crissman, MJeannette How MD   1 year ago Essential hypertension   [facility]   1 year ago Essential hypertension   [address] [facility]annette How [place]      Future Appointments            In 6 months [facility], [facility]annette How [facility]   In 9 months  [facility] [place]

## 2018-02-20 NOTE — Progress Notes (Signed)
BP 139/61   Pulse (!) 51   Temp 98.5 F (36.9 C) (Oral)   Ht 5' 6.14" (1.68 m)   Wt 166 lb (75.3 kg)   SpO2 98%   BMI 26.68 kg/m    Subjective:    Patient ID: Duane Edelman., male    DOB: 06-Nov-1940, 77 y.o.   MRN: [id number]  HPI: Duane Blahnik. is a 77 y.o. male  Chief Complaint  Patient presents with  . Annual Exam  Patient all in all doing well taking blood pressure cholesterol without any issues or problems takes gabapentin for DPN and is working well. Taking metformin extended release 1000 in the morning 1000 in the evening and has problems with diarrhea in the morning shortly after taking pill and also diarrhea in the middle of the night waking him up.  After his bouts of diarrhea does okay and has these diarrhea episodes 3-4 times a week. Diabetes otherwise doing well without problems.  Relevant past medical, surgical, family and social history reviewed and updated as indicated. Interim medical history since our last visit reviewed. Allergies and medications reviewed and updated.  Review of Systems  Constitutional: Negative.   HENT: Negative.   Eyes: Negative.   Respiratory: Negative.   Cardiovascular: Negative.   Gastrointestinal: Negative.   Endocrine: Negative.   Genitourinary: Negative.   Musculoskeletal: Negative.   Skin: Negative.   Allergic/Immunologic: Negative.   Neurological: Negative.   Hematological: Negative.   Psychiatric/Behavioral: Negative.     Per HPI unless specifically indicated above     Objective:    BP 139/61   Pulse (!) 51   Temp 98.5 F (36.9 C) (Oral)   Ht 5' 6.14" (1.68 m)   Wt 166 lb (75.3 kg)   SpO2 98%   BMI 26.68 kg/m   Wt Readings from Last 3 Encounters:  02/20/18 166 lb (75.3 kg)  12/01/17 168 lb 6.4 oz (76.4 kg)  06/09/17 175 lb (79.4 kg)    Physical Exam  Constitutional: He is oriented to person, place, and time. He appears well-developed and well-nourished.  HENT:  Head: Normocephalic and  atraumatic.  Right Ear: External ear normal.  Left Ear: External ear normal.  Eyes: Pupils are equal, round, and reactive to light. Conjunctivae and EOM are normal.  Neck: Normal range of motion. Neck supple.  Cardiovascular: Normal rate, regular rhythm, normal heart sounds and intact distal pulses.  Pulmonary/Chest: Effort normal and breath sounds normal.  Abdominal: Soft. Bowel sounds are normal. There is no splenomegaly or hepatomegaly.  Genitourinary: Rectum normal, prostate normal and penis normal.  Musculoskeletal: Normal range of motion.  Neurological: He is alert and oriented to person, place, and time. He has normal reflexes.  Skin: No rash noted. No erythema.  Psychiatric: He has a normal mood and affect. His behavior is normal. Judgment and thought content normal.    Results for orders placed or performed in visit on 12/06/17  HM DIABETES EYE EXAM  Result Value Ref Range   HM Diabetic Eye Exam No Retinopathy No Retinopathy      Assessment & Plan:   Problem List Items Addressed This Visit      Cardiovascular and Mediastinum   HTN (hypertension) - Primary    The current medical regimen is effective;  continue present plan and medications. a      Relevant Medications   atorvastatin (LIPITOR) 20 MG tablet   carvedilol (COREG) 25 MG tablet   hydrochlorothiazide (MICROZIDE) 12.5 MG capsule  quinapril (ACCUPRIL) 40 MG tablet   Other Relevant Orders   CBC with Differential/Platelet   Comprehensive metabolic panel   Lipid panel   Urinalysis, Routine w reflex microscopic   Bayer DCA Hb A1c Waived     Digestive   GERD (gastroesophageal reflux disease)    The current medical regimen is effective;  continue present plan and medications.       Relevant Medications   omeprazole (PRILOSEC) 40 MG capsule     Endocrine   Diabetes mellitus ([place])    Discussed diabetes and diarrhea will cut metformin back from 1000 twice daily to 500 twice daily and observe response if  unsatisfactory will consider other medications.      Relevant Medications   atorvastatin (LIPITOR) 20 MG tablet   metFORMIN (GLUCOPHAGE XR) 500 MG 24 hr tablet   quinapril (ACCUPRIL) 40 MG tablet   Other Relevant Orders   CBC with Differential/Platelet   Comprehensive metabolic panel   Lipid panel   Urinalysis, Routine w reflex microscopic   Bayer DCA Hb A1c Waived     Other   Hyperlipidemia    The current medical regimen is effective;  continue present plan and medications.       Relevant Medications   atorvastatin (LIPITOR) 20 MG tablet   carvedilol (COREG) 25 MG tablet   hydrochlorothiazide (MICROZIDE) 12.5 MG capsule   quinapril (ACCUPRIL) 40 MG tablet   Other Relevant Orders   CBC with Differential/Platelet   Comprehensive metabolic panel   Lipid panel   Urinalysis, Routine w reflex microscopic   Bayer DCA Hb A1c Waived   Advanced care planning/counseling discussion    A voluntary discussion about advanced care planning including explanation and discussion of advanced directives was extentively discussed with the patient.  Explained about the healthcare proxy and living will was reviewed and packet with forms with expiration of how to fill them out was given.  Time spent: Encounter 16+ min individuals present: Patient       Other Visit Diagnoses    Thyroid disorder screen       Relevant Orders   TSH   Prostate cancer screening           Follow up plan: Return in about 6 months (around 08/21/2018) for BMP,  Lipids, ALT, AST, Hemoglobin A1c.

## 2018-02-21 ENCOUNTER — Telehealth: Payer: Self-pay | Admitting: Family Medicine

## 2018-02-21 LAB — CBC WITH DIFFERENTIAL/PLATELET
Basophils Absolute: 0.1 10*3/uL (ref 0.0–0.2)
Basos: 1 %
EOS (ABSOLUTE): 0.2 10*3/uL (ref 0.0–0.4)
Eos: 2 %
Hematocrit: 36.2 % — ABNORMAL LOW (ref 37.5–51.0)
Hemoglobin: 11.7 g/dL — ABNORMAL LOW (ref 13.0–17.7)
IMMATURE GRANULOCYTES: 1 %
Immature Grans (Abs): 0 10*3/uL (ref 0.0–0.1)
LYMPHS ABS: 2.6 10*3/uL (ref 0.7–3.1)
Lymphs: 34 %
MCH: 26.9 pg (ref 26.6–33.0)
MCHC: 32.3 g/dL (ref 31.5–35.7)
MCV: 83 fL (ref 79–97)
MONOS ABS: 0.4 10*3/uL (ref 0.1–0.9)
Monocytes: 5 %
Neutrophils Absolute: 4.4 10*3/uL (ref 1.4–7.0)
Neutrophils: 57 %
PLATELETS: 256 10*3/uL (ref 150–450)
RBC: 4.35 x10E6/uL (ref 4.14–5.80)
RDW: 14.2 % (ref 12.3–15.4)
WBC: 7.6 10*3/uL (ref 3.4–10.8)

## 2018-02-21 LAB — COMPREHENSIVE METABOLIC PANEL
A/G RATIO: 2.5 — AB (ref 1.2–2.2)
ALK PHOS: 35 IU/L — AB (ref 39–117)
ALT: 26 IU/L (ref 0–44)
AST: 17 IU/L (ref 0–40)
Albumin: 4.3 g/dL (ref 3.5–4.8)
BUN/Creatinine Ratio: 11 (ref 10–24)
BUN: 15 mg/dL (ref 8–27)
Bilirubin Total: 0.2 mg/dL (ref 0.0–1.2)
CALCIUM: 9.3 mg/dL (ref 8.6–10.2)
CO2: 22 mmol/L (ref 20–29)
CREATININE: 1.36 mg/dL — AB (ref 0.76–1.27)
Chloride: 103 mmol/L (ref 96–106)
GFR calc non Af Amer: 51 mL/min/{1.73_m2} — ABNORMAL LOW (ref >59)
GFR, EST AFRICAN AMERICAN: 58 mL/min/{1.73_m2} — AB (ref >59)
Globulin, Total: 1.7 g/dL (ref 1.5–4.5)
Glucose: 96 mg/dL (ref 65–99)
Potassium: 4.2 mmol/L (ref 3.5–5.2)
Sodium: 140 mmol/L (ref 134–144)
Total Protein: 6 g/dL (ref 6.0–8.5)

## 2018-02-21 LAB — LIPID PANEL
CHOLESTEROL TOTAL: 130 mg/dL (ref 100–199)
Chol/HDL Ratio: 5.4 ratio — ABNORMAL HIGH (ref 0.0–5.0)
HDL: 24 mg/dL — AB (ref >39)
LDL Calculated: 61 mg/dL (ref 0–99)
Triglycerides: 223 mg/dL — ABNORMAL HIGH (ref 0–149)
VLDL CHOLESTEROL CAL: 45 mg/dL — AB (ref 5–40)

## 2018-02-21 LAB — TSH: TSH: 3.12 u[IU]/mL (ref 0.450–4.500)

## 2018-02-21 NOTE — Telephone Encounter (Signed)
I called the patient about community resource referral for transportation.  I received a message that the call can't be completed at this time.   Duane [place]

## 2018-02-23 ENCOUNTER — Encounter: Payer: Self-pay | Admitting: Family Medicine

## 2018-04-13 ENCOUNTER — Ambulatory Visit (INDEPENDENT_AMBULATORY_CARE_PROVIDER_SITE_OTHER): Payer: Medicare Other

## 2018-04-13 DIAGNOSIS — Z23 Encounter for immunization: Secondary | ICD-10-CM

## 2018-05-06 ENCOUNTER — Other Ambulatory Visit: Payer: Self-pay | Admitting: Family Medicine

## 2018-05-08 NOTE — Telephone Encounter (Signed)
Requested Prescriptions  Pending Prescriptions Disp Refills  . Azelastine HCl 0.15 % SOLN [Pharmacy Med Name: AZELASTINE 0.15% NASAL SPRAY] 90 mL 0    Sig: Place 2 sprays into both nostrils 2 (two) times daily.     Ear, Nose, and Throat: Nasal Preparations - Antiallergy Passed - 05/06/2018 10:24 AM      Passed - Valid encounter within last 12 months    Recent Outpatient Visits          2 months ago Essential hypertension   [place] Crissman, Jeannette How, MD   11 months ago Essential hypertension   [place] Crissman, Jeannette How, MD   1 year ago Essential hypertension   [place], Jeannette How, MD   1 year ago Essential hypertension   [place] Crissman, Jeannette How, MD   1 year ago Essential hypertension   [place], Jeannette How, MD      Future Appointments            In 4 months Crissman, Jeannette How, MD [place]   In 7 months  [facility] [facility], [place]

## 2018-05-13 ENCOUNTER — Other Ambulatory Visit: Payer: Self-pay | Admitting: Family Medicine

## 2018-05-13 DIAGNOSIS — E785 Hyperlipidemia, unspecified: Secondary | ICD-10-CM

## 2018-05-15 NOTE — Telephone Encounter (Signed)
Requested medication (s) are due for refill today: Yes  Requested medication (s) are on the active medication list: Yes  Last refill:  2018  Future visit scheduled: Yes  Notes to clinic:  Expired Rx, unable to refill.     Requested Prescriptions  Pending Prescriptions Disp Refills   [place]oline Fenofibrate (FENOFIBRIC ACID) 135 MG CPDR [Pharmacy Med Name: FENOFIBRIC ACID DR 135 MG CAP] 90 capsule 0    Sig: TAKE ONE CAPSULE DAILY.     Cardiovascular:  Antilipid - Fibric Acid Derivatives Failed - 05/13/2018 11:45 AM      Failed - HDL in normal range and within 360 days    HDL  Date Value Ref Range Status  02/20/2018 24 (L) >39 mg/dL Final         Failed - Triglycerides in normal range and within 360 days    Triglycerides  Date Value Ref Range Status  02/20/2018 223 (H) 0 - 149 mg/dL Final   Triglycerides Piccolo,Waived  Date Value Ref Range Status  05/25/2016 280 (H) <150 mg/dL Final    Comment:                            Normal                   <150                         Borderline High     150 - 199                         High                200 - 499                         Very High                >499          Failed - Cr in normal range and within 180 days    Creatinine  Date Value Ref Range Status  08/16/2014 1.11 mg/dL Final    Comment:    0.61-1.24 NOTE: New Reference Range  06/25/14    Creatinine, Ser  Date Value Ref Range Status  02/20/2018 1.36 (H) 0.76 - 1.27 mg/dL Final         Failed - eGFR in normal range and within 180 days    EGFR (African American)  Date Value Ref Range Status  08/16/2014 >60  Final   GFR calc Af Amer  Date Value Ref Range Status  02/20/2018 58 (L) >59 mL/min/1.73 Final   EGFR (Non-African Amer.)  Date Value Ref Range Status  08/16/2014 >60  Final    Comment:    eGFR values <51m/min/1.73 m2 may be an indication of chronic kidney disease (CKD). Calculated eGFR is useful in patients with stable renal  function. The eGFR calculation will not be reliable in acutely ill patients when serum creatinine is changing rapidly. It is not useful in patients on dialysis. The eGFR calculation may not be applicable to patients at the low and high extremes of body sizes, pregnant women, and vegetarians.    GFR calc non Af Amer  Date Value Ref Range Status  02/20/2018 51 (L) >59 mL/min/1.73 Final         Passed - Total Cholesterol  in normal range and within 360 days    Cholesterol, Total  Date Value Ref Range Status  02/20/2018 130 100 - 199 mg/dL Final   Cholesterol [place], [place]  Date Value Ref Range Status  05/25/2016 141 <200 mg/dL Final    Comment:                            Desirable                <200                         Borderline High      200- 239                         High                     >239          Passed - LDL in normal range and within 360 days    LDL Calculated  Date Value Ref Range Status  02/20/2018 61 0 - 99 mg/dL Final         Passed - ALT in normal range and within 180 days    ALT  Date Value Ref Range Status  02/20/2018 26 0 - 44 IU/L Final   ALT (SGPT) Piccolo, Waived  Date Value Ref Range Status  05/25/2016 31 10 - 47 U/L Final         Passed - AST in normal range and within 180 days    AST  Date Value Ref Range Status  02/20/2018 17 0 - 40 IU/L Final   AST (SGOT) [place], Waived  Date Value Ref Range Status  05/25/2016 27 11 - 38 U/L Final         Passed - Valid encounter within last 12 months    Recent Outpatient Visits          2 months ago Essential hypertension   [place] Crissman, Jeannette How, MD   11 months ago Essential hypertension   [place] Crissman, Jeannette How, MD   1 year ago Essential hypertension   [place] Crissman, Jeannette How, MD   1 year ago Essential hypertension   [place], Jeannette How, MD   1 year ago Essential hypertension   [place], Jeannette How, MD      Future Appointments            In 4 months Crissman, Jeannette How, MD [place]   In 6 months  [place]         Refused Prescriptions Disp Refills   Azelastine HCl 0.15 % SOLN [Pharmacy Med Name: AZELASTINE 0.15% NASAL SPRAY] 90 mL 0    Sig: Place 2 sprays into both nostrils 2 (two) times daily.     Ear, Nose, and Throat: Nasal Preparations - Antiallergy Passed - 05/13/2018 11:45 AM      Passed - Valid encounter within last 12 months    Recent Outpatient Visits          2 months ago Essential hypertension   [place], Jeannette How, MD   11 months ago Essential hypertension   [place], Jeannette How, MD   1 year ago Essential hypertension   [place],  Jeannette How, MD   1 year ago Essential hypertension   [facility] Crissman, Jeannette How, MD   1 year ago Essential hypertension   [place], Jeannette How, MD      Future Appointments            In 4 months Crissman, Jeannette How, MD [facility]   In 6 months  [facility], [place]

## 2018-05-22 NOTE — Telephone Encounter (Signed)
closing encounter

## 2018-06-02 DIAGNOSIS — H2513 Age-related nuclear cataract, bilateral: Secondary | ICD-10-CM | POA: Diagnosis not present

## 2018-06-02 DIAGNOSIS — E119 Type 2 diabetes mellitus without complications: Secondary | ICD-10-CM | POA: Diagnosis not present

## 2018-06-02 DIAGNOSIS — H353131 Nonexudative age-related macular degeneration, bilateral, early dry stage: Secondary | ICD-10-CM | POA: Diagnosis not present

## 2018-08-01 ENCOUNTER — Other Ambulatory Visit: Payer: Self-pay | Admitting: Family Medicine

## 2018-08-01 NOTE — Telephone Encounter (Signed)
Requested Prescriptions  Pending Prescriptions Disp Refills  . Azelastine HCl 0.15 % SOLN [Pharmacy Med Name: AZELASTINE 0.15% NASAL SPRAY] 90 mL 0    Sig: Place 2 sprays into both nostrils 2 (two) times daily.     Ear, Nose, and Throat: Nasal Preparations - Antiallergy Passed - 08/01/2018  9:37 AM      Passed - Valid encounter within last 12 months    Recent Outpatient Visits          5 months ago Essential hypertension   [facility] Crissman, Jeannette How, MD   1 year ago Essential hypertension   [place] Crissman, Jeannette How, MD   1 year ago Essential hypertension   [place], Jeannette How, MD   1 year ago Essential hypertension   [place], Jeannette How, MD   2 years ago Essential hypertension   [place], Jeannette How, MD      Future Appointments            In 1 month Crissman, Jeannette How, MD [place], [place]   In 4 months  [facility], [place]

## 2018-08-02 MED ORDER — AZELASTINE HCL 0.15 % NA SOLN: 2.0000 | Freq: Two times a day (BID) | NASAL | 3 refills | Status: DC

## 2018-08-10 ENCOUNTER — Other Ambulatory Visit: Payer: Self-pay | Admitting: Family Medicine

## 2018-08-17 ENCOUNTER — Encounter: Payer: Self-pay | Admitting: Family Medicine

## 2018-08-18 ENCOUNTER — Other Ambulatory Visit: Payer: Self-pay | Admitting: Family Medicine

## 2018-08-18 MED ORDER — METFORMIN HCL ER 500 MG PO TB24: 1000.0000 mg | ORAL_TABLET | Freq: Two times a day (BID) | ORAL | 4 refills | Status: DC

## 2018-09-08 ENCOUNTER — Telehealth: Payer: Self-pay | Admitting: Family Medicine

## 2018-09-08 NOTE — Telephone Encounter (Signed)
Called pt to let him know visit for Tuesday would be a telephone call, no answer, voicemail not set up

## 2018-09-12 ENCOUNTER — Ambulatory Visit (INDEPENDENT_AMBULATORY_CARE_PROVIDER_SITE_OTHER): Payer: Medicare Other | Admitting: Family Medicine

## 2018-09-12 ENCOUNTER — Encounter: Payer: Self-pay | Admitting: Family Medicine

## 2018-09-12 ENCOUNTER — Other Ambulatory Visit: Payer: Self-pay

## 2018-09-12 VITALS — BP 161/80 | HR 64 | Temp 98.7°F | Wt 176.0 lb

## 2018-09-12 DIAGNOSIS — E1169 Type 2 diabetes mellitus with other specified complication: Secondary | ICD-10-CM

## 2018-09-12 DIAGNOSIS — E119 Type 2 diabetes mellitus without complications: Secondary | ICD-10-CM | POA: Diagnosis not present

## 2018-09-12 DIAGNOSIS — I10 Essential (primary) hypertension: Secondary | ICD-10-CM | POA: Diagnosis not present

## 2018-09-12 DIAGNOSIS — E7800 Pure hypercholesterolemia, unspecified: Secondary | ICD-10-CM

## 2018-09-12 LAB — BAYER DCA HB A1C WAIVED: HB A1C (BAYER DCA - WAIVED): 7.6 % — ABNORMAL HIGH (ref <7.0)

## 2018-09-12 NOTE — Assessment & Plan Note (Signed)
The current medical regimen is effective;  continue present plan and medications.  

## 2018-09-12 NOTE — Assessment & Plan Note (Signed)
Probable poor control secondary to decrease metformin dose but patient taking increase metformin with good blood sugar readings

## 2018-09-12 NOTE — Assessment & Plan Note (Signed)
Poor control off medications is restarting today.

## 2018-09-12 NOTE — Progress Notes (Signed)
BP (!) 161/80   Pulse 64   Temp 98.7 F (37.1 C) (Oral)   Wt 176 lb (79.8 kg)   SpO2 97%   BMI 28.29 kg/m    Subjective:    Patient ID: Duane Edelman., male    DOB: April 27, 1940, 78 y.o.   MRN: [id number]  HPI: Duane Garno. is a 78 y.o. male  Med check  Telemedicine using audio/video telecommunications for a synchronous communication visit. Today's visit due to COVID-19 isolation precautions I connected with and verified that I am speaking with the correct person using two identifiers.   I discussed the limitations, risks, security and privacy concerns of performing an evaluation and management service by telecommunication and the availability of in person appointments. I also discussed with the patient that there may be a patient responsible charge related to this service. The patient expressed understanding and agreed to proceed. The patient's location is home I am at home.  Discussed with patient blood pressure has been out of hydrochlorothiazide and quinapril is only been taking carvedilol but got refills today and is starting to take them. Also had tried cutting back on metformin from 2000 mg a day to 1000 mg a day and blood sugar went up significantly and is just started back on 1000 mg twice a day. Taking cholesterol medicines without problems or issues.  Relevant past medical, surgical, family and social history reviewed and updated as indicated. Interim medical history since our last visit reviewed. Allergies and medications reviewed and updated.  Review of Systems  Constitutional: Negative.   Respiratory: Negative.   Cardiovascular: Negative.     Per HPI unless specifically indicated above     Objective:    BP (!) 161/80   Pulse 64   Temp 98.7 F (37.1 C) (Oral)   Wt 176 lb (79.8 kg)   SpO2 97%   BMI 28.29 kg/m   Wt Readings from Last 3 Encounters:  09/12/18 176 lb (79.8 kg)  02/20/18 166 lb (75.3 kg)  12/01/17 168 lb 6.4 oz (76.4 kg)     Physical Exam  Results for orders placed or performed in visit on 02/20/18  Microscopic Examination  Result Value Ref Range   WBC, UA 0-5 0 - 5 /hpf   RBC, UA 11-30 (A) 0 - 2 /hpf   Epithelial Cells (non renal) 0-10 0 - 10 /hpf   Renal Epithel, UA 0-10 (A) None seen /hpf   Bacteria, UA None seen None seen/Few  CBC with Differential/Platelet  Result Value Ref Range   WBC 7.6 3.4 - 10.8 x10E3/uL   RBC 4.35 4.14 - 5.80 x10E6/uL   Hemoglobin 11.7 (L) 13.0 - 17.7 g/dL   Hematocrit 36.2 (L) 37.5 - 51.0 %   MCV 83 79 - 97 fL   MCH 26.9 26.6 - 33.0 pg   MCHC 32.3 31.5 - 35.7 g/dL   RDW 14.2 12.3 - 15.4 %   Platelets 256 150 - 450 x10E3/uL   Neutrophils 57 Not Estab. %   Lymphs 34 Not Estab. %   Monocytes 5 Not Estab. %   Eos 2 Not Estab. %   Basos 1 Not Estab. %   Neutrophils Absolute 4.4 1.4 - 7.0 x10E3/uL   Lymphocytes Absolute 2.6 0.7 - 3.1 x10E3/uL   Monocytes Absolute 0.4 0.1 - 0.9 x10E3/uL   EOS (ABSOLUTE) 0.2 0.0 - 0.4 x10E3/uL   Basophils Absolute 0.1 0.0 - 0.2 x10E3/uL   Immature Granulocytes 1 Not Estab. %  Immature Grans (Abs) 0.0 0.0 - 0.1 x10E3/uL  Comprehensive metabolic panel  Result Value Ref Range   Glucose 96 65 - 99 mg/dL   BUN 15 8 - 27 mg/dL   Creatinine, Ser 1.36 (H) 0.76 - 1.27 mg/dL   GFR calc non Af Amer 51 (L) >59 mL/min/1.73   GFR calc Af Amer 58 (L) >59 mL/min/1.73   BUN/Creatinine Ratio 11 10 - 24   Sodium 140 134 - 144 mmol/L   Potassium 4.2 3.5 - 5.2 mmol/L   Chloride 103 96 - 106 mmol/L   CO2 22 20 - 29 mmol/L   Calcium 9.3 8.6 - 10.2 mg/dL   Total Protein 6.0 6.0 - 8.5 g/dL   Albumin 4.3 3.5 - 4.8 g/dL   Globulin, Total 1.7 1.5 - 4.5 g/dL   Albumin/Globulin Ratio 2.5 (H) 1.2 - 2.2   Bilirubin Total 0.2 0.0 - 1.2 mg/dL   Alkaline Phosphatase 35 (L) 39 - 117 IU/L   AST 17 0 - 40 IU/L   ALT 26 0 - 44 IU/L  Lipid panel  Result Value Ref Range   Cholesterol, Total 130 100 - 199 mg/dL   Triglycerides 223 (H) 0 - 149 mg/dL   HDL 24 (L) >39  mg/dL   VLDL Cholesterol Cal 45 (H) 5 - 40 mg/dL   LDL Calculated 61 0 - 99 mg/dL   Chol/HDL Ratio 5.4 (H) 0.0 - 5.0 ratio  TSH  Result Value Ref Range   TSH 3.120 0.450 - 4.500 uIU/mL  Urinalysis, Routine w reflex microscopic  Result Value Ref Range   Specific Gravity, UA 1.020 1.005 - 1.030   pH, UA 5.0 5.0 - 7.5   Color, UA Yellow Yellow   Appearance Ur Hazy (A) Clear   Leukocytes, UA Negative Negative   Protein, UA Negative Negative/Trace   Glucose, UA Negative Negative   Ketones, UA Negative Negative   RBC, UA 3+ (A) Negative   Bilirubin, UA Negative Negative   Urobilinogen, Ur 0.2 0.2 - 1.0 mg/dL   Nitrite, UA Negative Negative   Microscopic Examination See below:   Bayer DCA Hb A1c Waived  Result Value Ref Range   HB A1C (BAYER DCA - WAIVED) 6.4 <7.0 %      Assessment & Plan:   Problem List Items Addressed This Visit      Cardiovascular and Mediastinum   HTN (hypertension) - Primary    Poor control off medications is restarting today.      Relevant Orders   Basic metabolic panel   Bayer DCA Hb A1c Waived   LP+ALT+AST [place], [place]     Endocrine   Diabetes mellitus associated with hormonal etiology ([place])    Probable poor control secondary to decrease metformin dose but patient taking increase metformin with good blood sugar readings        Other   Hyperlipidemia    The current medical regimen is effective;  continue present plan and medications.       Relevant Orders   Basic metabolic panel   Bayer DCA Hb A1c Waived   LP+ALT+AST [place], [place]      I discussed the assessment and treatment plan with the patient. The patient was provided an opportunity to ask questions and all were answered. The patient agreed with the plan and demonstrated an understanding of the instructions.   The patient was advised to call back or seek an in-person evaluation if the symptoms worsen or if the condition fails to improve as  anticipated.   I provided 21+ minutes  of time during this encounter. Follow up plan: Return in about 6 months (around 03/15/2019) for Physical Exam, Hemoglobin A1c.

## 2018-09-12 NOTE — Addendum Note (Signed)
Addended by: Gerda Diss A on: 09/12/2018 10:50 AM   Modules accepted: Orders

## 2018-09-13 ENCOUNTER — Encounter: Payer: Self-pay | Admitting: Family Medicine

## 2018-09-13 LAB — BASIC METABOLIC PANEL
BUN/Creatinine Ratio: 12 (ref 10–24)
BUN: 15 mg/dL (ref 8–27)
CO2: 19 mmol/L — ABNORMAL LOW (ref 20–29)
Calcium: 9.1 mg/dL (ref 8.6–10.2)
Chloride: 102 mmol/L (ref 96–106)
Creatinine, Ser: 1.26 mg/dL (ref 0.76–1.27)
GFR calc Af Amer: 64 mL/min/{1.73_m2} (ref >59)
GFR calc non Af Amer: 55 mL/min/{1.73_m2} — ABNORMAL LOW (ref >59)
Glucose: 150 mg/dL — ABNORMAL HIGH (ref 65–99)
Potassium: 4.5 mmol/L (ref 3.5–5.2)
Sodium: 139 mmol/L (ref 134–144)

## 2018-09-13 LAB — LIPID PANEL
Chol/HDL Ratio: 5.1 ratio — ABNORMAL HIGH (ref 0.0–5.0)
Cholesterol, Total: 117 mg/dL (ref 100–199)
HDL: 23 mg/dL — ABNORMAL LOW (ref >39)
LDL Calculated: 30 mg/dL (ref 0–99)
Triglycerides: 322 mg/dL — ABNORMAL HIGH (ref 0–149)
VLDL Cholesterol Cal: 64 mg/dL — ABNORMAL HIGH (ref 5–40)

## 2018-11-27 ENCOUNTER — Other Ambulatory Visit: Payer: Self-pay | Admitting: Family Medicine

## 2018-11-27 DIAGNOSIS — E785 Hyperlipidemia, unspecified: Secondary | ICD-10-CM

## 2018-12-01 DIAGNOSIS — H2513 Age-related nuclear cataract, bilateral: Secondary | ICD-10-CM | POA: Diagnosis not present

## 2018-12-01 DIAGNOSIS — E119 Type 2 diabetes mellitus without complications: Secondary | ICD-10-CM | POA: Diagnosis not present

## 2018-12-01 DIAGNOSIS — H353131 Nonexudative age-related macular degeneration, bilateral, early dry stage: Secondary | ICD-10-CM | POA: Diagnosis not present

## 2018-12-01 LAB — HM DIABETES EYE EXAM

## 2018-12-06 ENCOUNTER — Ambulatory Visit (INDEPENDENT_AMBULATORY_CARE_PROVIDER_SITE_OTHER): Payer: Medicare Other

## 2018-12-06 DIAGNOSIS — Z0000 Encounter for general adult medical examination without abnormal findings: Secondary | ICD-10-CM | POA: Diagnosis not present

## 2018-12-06 DIAGNOSIS — E119 Type 2 diabetes mellitus without complications: Secondary | ICD-10-CM

## 2018-12-06 NOTE — Progress Notes (Signed)
Subjective:   Duane Rode. is a 77 y.o. male who presents for Medicare Annual/Subsequent preventive examination.  This visit is being conducted via phone call  - after an attmept to do on video chat - due to the COVID-19 pandemic. This patient has given me verbal consent via phone to conduct this visit, patient states they are participating from their home address. Some vital signs may be absent or patient reported.   Patient identification: identified by name, DOB, and current address.    Review of Systems:   Cardiac Risk Factors include: advanced age (>73men, >73 women);male gender;diabetes mellitus;dyslipidemia;hypertension     Objective:    Vitals: There were no vitals taken for this visit.  There is no height or weight on file to calculate BMI.  Advanced Directives 12/06/2018 12/01/2017 11/24/2016  Does Patient Have a Medical Advance Directive? Yes Yes No  Type of Advance Directive Living will;Healthcare Power of [place] in Chart? No - copy requested No - copy requested -  Would patient like information on creating a medical advance directive? - - Yes (MAU/Ambulatory/Procedural Areas - Information given)    Tobacco Social History   Tobacco Use  Smoking Status Former Smoker  . Types: Cigarettes  . Quit date: 11/28/1986  . Years since quitting: 32.0  Smokeless Tobacco Never Used     Counseling given: Not Answered   Clinical Intake:  Pre-visit preparation completed: Yes  Pain : No/denies pain     Nutritional Risks: None Diabetes: Yes CBG done?: No Did pt. bring in CBG monitor from home?: No  How often do you need to have someone help you when you read instructions, pamphlets, or other written materials from your doctor or pharmacy?: 1 - Never  Nutrition Risk Assessment:  Has the patient had any N/V/D within the last 2 months?  No  Does the patient have any non-healing wounds?  No  Has  the patient had any unintentional weight loss or weight gain?  No   Diabetes:  Is the patient diabetic?  Yes  If diabetic, was a CBG obtained today?  Yes  165 checked at home  Did the patient bring in their glucometer from home?  No  How often do you monitor your CBG's? Couple times a week   Financial Strains and Diabetes Management:  Are you having any financial strains with the device, your supplies or your medication? No .  Does the patient want to be seen by Chronic Care Management for management of their diabetes?  yes Would the patient like to be referred to a Nutritionist or for Diabetic Management?  yes  Diabetic Exams:  Diabetic Eye Exam: Completed 12/01/2018. Dr.Woodard  Diabetic Foot Exam: . Pt has been advised about the importance in completing this exam.  Interpreter Needed?: No  Information entered by :: Keilani Terrance,LPN  Past Medical History:  Diagnosis Date  . Arthritis   . Asthma    Inhaler as needed  . Dupuytren contracture   . GERD (gastroesophageal reflux disease)   . Hyperlipidemia   . Hypertension   . Radiation proctitis   . Renal insufficiency    Past Surgical History:  Procedure Laterality Date  . PROSTATE SURGERY     Family History  Problem Relation Age of Onset  . Heart attack Mother   . Hypertension Father   . Stroke Father   . Cancer Father   . Diabetes Father   .  Heart attack Father    Social History   Socioeconomic History  . Marital status: Divorced    Spouse name: Not on file  . Number of children: Not on file  . Years of education: Not on file  . Highest education level: High school graduate  Occupational History  . Not on file  Social Needs  . Financial resource strain: Not very hard  . Food insecurity    Worry: Never true    Inability: Never true  . Transportation needs    Medical: No    Non-medical: Yes  Tobacco Use  . Smoking status: Former Smoker    Types: Cigarettes    Quit date: 11/28/1986    Years since  quitting: 32.0  . Smokeless tobacco: Never Used  Substance and Sexual Activity  . Alcohol use: Yes    Alcohol/week: 7.0 standard drinks    Types: 7 Glasses of wine per week    Comment: 1 glass of wine at night  . Drug use: No  . Sexual activity: Not on file  Lifestyle  . Physical activity    Days per week: 0 days    Minutes per session: 0 min  . Stress: Not at all  Relationships  . Social connections    Talks on phone: Once a week    Gets together: Three times a week    Attends religious service: Never    Active member of club or organization: No    Attends meetings of clubs or organizations: Never    Relationship status: Divorced  Other Topics Concern  . Not on file  Social History Narrative   Retired from city     Outpatient Encounter Medications as of 12/06/2018  Medication Sig  . acetaminophen (TYLENOL) 500 MG tablet Take 1,000 mg by mouth every 6 (six) hours as needed. Patient uss Caplet extra strength  . atorvastatin (LIPITOR) 20 MG tablet Take 1 tablet (20 mg total) by mouth daily.  . Azelastine HCl 0.15 % SOLN Place 2 sprays into both nostrils 2 (two) times daily. Place 2 sprays into both nostrils 2 (two) times daily.  . carvedilol (COREG) 25 MG tablet Take 1 tablet (25 mg total) by mouth 2 (two) times daily with a meal.  . Choline Fenofibrate (FENOFIBRIC ACID) 135 MG CPDR TAKE ONE CAPSULE DAILY.  Marland Kitchen gabapentin (NEURONTIN) 300 MG capsule Take 2 capsules (600 mg total) by mouth 2 (two) times daily.  Marland Kitchen glucose blood test strip CHECK BLOOD SUGAR TWICE DAILY.  . hydrochlorothiazide (MICROZIDE) 12.5 MG capsule Take 1 capsule (12.5 mg total) by mouth daily.  . metFORMIN (GLUCOPHAGE XR) 500 MG 24 hr tablet Take 2 tablets (1,000 mg total) by mouth 2 (two) times daily at 8 am and 10 pm.  . MICROLET LANCETS MISC USE TWICE DAILY AS DIRECTED.  Marland Kitchen omeprazole (PRILOSEC) 40 MG capsule Take 1 capsule (40 mg total) by mouth daily.  . quinapril (ACCUPRIL) 40 MG tablet Take 1 tablet (40 mg  total) by mouth daily.  Enid Cutter HFA 108 (90 Base) MCG/ACT inhaler Take 2 puffs every 4-6 hours as necessary for shortness of breath   No facility-administered encounter medications on file as of 12/06/2018.     Activities of Daily Living In your present state of health, do you have any difficulty performing the following activities: 12/06/2018 02/20/2018  Hearing? N N  Comment no hearing aids -  Vision? N N  Comment reading glasses -  Difficulty concentrating or making decisions? N N  Walking or climbing stairs? N N  Dressing or bathing? N N  Doing errands, shopping? Y N  Comment doesnt have a car Facilities manager and eating ? N -  Using the Toilet? N -  In the past six months, have you accidently leaked urine? Y -  Comment pads for protection -  Do you have problems with loss of bowel control? Y -  Comment wears pads for protection- d/t metformin -  Managing your Medications? N -  Managing your Finances? N -  Housekeeping or managing your Housekeeping? N -  Some recent data might be hidden    Patient Care Team: Guadalupe Maple, MD as PCP - General (Family Medicine) Hollice Espy, MD as Consulting Physician (Urology)   Assessment:   This is a routine wellness examination for Duane Hill.  Exercise Activities and Dietary recommendations Current Exercise Habits: The patient does not participate in regular exercise at present, Exercise limited by: None identified  Goals    . DIET - INCREASE WATER INTAKE     Recommend continue drinking at least 6-8 glasses of water a day        Fall Risk Fall Risk  12/06/2018 02/20/2018 12/01/2017 06/09/2017 11/29/2016  Falls in the past year? 0 0 No No No  Number falls in past yr: - 0 - - -  Injury with Fall? - 0 - - -   FALL RISK PREVENTION PERTAINING TO THE HOME:  Any stairs in or around the home? Yes  If so, are there any without handrails? No   Home free of loose throw rugs in walkways, pet beds, electrical cords, etc? Yes  Adequate  lighting in your home to reduce risk of falls? Yes   ASSISTIVE DEVICES UTILIZED TO PREVENT FALLS:  Life alert? No  Use of a cane, walker or w/c? No  Grab bars in the bathroom? Yes, grab bars Shower chair or bench in shower? No  Elevated toilet seat or a handicapped toilet? No    TIMED UP AND GO:  Unable to perform    Depression Screen PHQ 2/9 Scores 12/06/2018 02/20/2018 12/01/2017 11/24/2016  PHQ - 2 Score 0 0 0 3  PHQ- 9 Score - 0 - 3    Cognitive Function     6CIT Screen 12/01/2017 11/24/2016  What Year? 0 points 0 points  What month? 0 points 0 points  What time? 0 points 0 points  Count back from 20 0 points 0 points  Months in reverse 0 points 0 points  Repeat phrase 0 points 0 points  Total Score 0 0    Immunization History  Administered Date(s) Administered  . Influenza, High Dose Seasonal PF 02/24/2016, 01/25/2017, 04/13/2018  . Influenza,inj,Quad PF,6+ Mos 02/05/2015  . Pneumococcal Conjugate-13 10/18/2013  . Pneumococcal Polysaccharide-23 02/24/2016  . Pneumococcal-Unspecified 01/06/2009  . Tdap 07/20/2011  . Zoster 01/07/2009    Qualifies for Shingles Vaccine? Yes  Zostavax completed 01/07/2009. Due for Shingrix. Education has been provided regarding the importance of this vaccine. Pt has been advised to call insurance company to determine out of pocket expense. Advised may also receive vaccine at local pharmacy or Health Dept. Verbalized acceptance and understanding.  Tdap: up to date   Flu Vaccine: Due 12/2018  Pneumococcal Vaccine: up to date    Screening Tests Health Maintenance  Topic Date Due  . FOOT EXAM  06/09/2018  . INFLUENZA VACCINE  11/18/2018  . HEMOGLOBIN A1C  03/15/2019  . OPHTHALMOLOGY EXAM  12/01/2019  .  TETANUS/TDAP  07/19/2021  . PNA vac Low Risk Adult  Completed  . COLONOSCOPY  Discontinued   Cancer Screenings:  Colorectal Screening: Completed 01/22/2013, no longer required   Lung Cancer Screening: (Low Dose CT Chest  recommended if Age 31-80 years, 30 pack-year currently smoking OR have quit w/in 15years.) does not qualify.    Additional Screening:  Hepatitis C Screening: does not qualify  Dental Screening: Recommended annual dental exams for proper oral hygiene  Community Resource Referral:  CRR required this visit?  No   Discussed transportation assistance if needed in the future, he declined it at this time but will call if needed.       Plan:  I have personally reviewed and addressed the Medicare Annual Wellness questionnaire and have noted the following in the patient's chart:  A. Medical and social history B. Use of alcohol, tobacco or illicit drugs  C. Current medications and supplements D. Functional ability and status E.  Nutritional status F.  Physical activity G. Advance directives H. List of other physicians I.  Hospitalizations, surgeries, and ER visits in previous 12 months J.  [place] such as hearing and vision if needed, cognitive and depression L. Referrals and appointments   In addition, I have reviewed and discussed with patient certain preventive protocols, quality metrics, and best practice recommendations. A written personalized care plan for preventive services as well as general preventive health recommendations were provided to patient.   Signed,   Bevelyn Ngo, LPN  [id number] Nurse Health Advisor  Nurse Notes: none

## 2018-12-06 NOTE — Patient Instructions (Signed)
Mr. Duane Hill , Thank you for taking time to come for your Medicare Wellness Visit. I appreciate your ongoing commitment to your health goals. Please review the following plan we discussed and let me know if I can assist you in the future.   Screening recommendations/referrals: Colonoscopy: no longer required  Recommended yearly ophthalmology/optometry visit for glaucoma screening and checkup Recommended yearly dental visit for hygiene and checkup  Vaccinations: Influenza vaccine: due 12/2018 Pneumococcal vaccine: up to date  Tdap vaccine: up to date Shingles vaccine: shingrix eligible, check with your insurance for coverage   Advanced directives: Please bring a copy of your health care power of attorney and living will to the office at your convenience.  Conditions/risks identified: diabetic - discussed chronic care management team, someone will contact you from this team shortly.   Next appointment: Follow up in one year for your annual wellness visit   Preventive Care 65 Years and Older, Male Preventive care refers to lifestyle choices and visits with your health care provider that can promote health and wellness. What does preventive care include?  A yearly physical exam. This is also called an annual well check.  Dental exams once or twice a year.  Routine eye exams. Ask your health care provider how often you should have your eyes checked.  Personal lifestyle choices, including:  Daily care of your teeth and gums.  Regular physical activity.  Eating a healthy diet.  Avoiding tobacco and drug use.  Limiting alcohol use.  Practicing safe sex.  Taking low doses of aspirin every day.  Taking vitamin and mineral supplements as recommended by your health care provider. What happens during an annual well check? The services and screenings done by your health care provider during your annual well check will depend on your age, overall health, lifestyle risk factors, and  family history of disease. Counseling  Your health care provider may ask you questions about your:  Alcohol use.  Tobacco use.  Drug use.  Emotional well-being.  Home and relationship well-being.  Sexual activity.  Eating habits.  History of falls.  Memory and ability to understand (cognition).  Work and work Statistician. Screening  You may have the following tests or measurements:  Height, weight, and BMI.  Blood pressure.  Lipid and cholesterol levels. These may be checked every 5 years, or more frequently if you are over 56 years old.  Skin check.  Lung cancer screening. You may have this screening every year starting at age 52 if you have a 30-pack-year history of smoking and currently smoke or have quit within the past 15 years.  Fecal occult blood test (FOBT) of the stool. You may have this test every year starting at age 44.  Flexible sigmoidoscopy or colonoscopy. You may have a sigmoidoscopy every 5 years or a colonoscopy every 10 years starting at age 41.  Prostate cancer screening. Recommendations will vary depending on your family history and other risks.  Hepatitis C blood test.  Hepatitis B blood test.  Sexually transmitted disease (STD) testing.  Diabetes screening. This is done by checking your blood sugar (glucose) after you have not eaten for a while (fasting). You may have this done every 1-3 years.  Abdominal aortic aneurysm (AAA) screening. You may need this if you are a current or former smoker.  Osteoporosis. You may be screened starting at age 62 if you are at high risk. Talk with your health care provider about your test results, treatment options, and if necessary, the need  for more tests. Vaccines  Your health care provider may recommend certain vaccines, such as:  Influenza vaccine. This is recommended every year.  Tetanus, diphtheria, and acellular pertussis (Tdap, Td) vaccine. You may need a Td booster every 10 years.  Zoster  vaccine. You may need this after age 73.  Pneumococcal 13-valent conjugate (PCV13) vaccine. One dose is recommended after age 37.  Pneumococcal polysaccharide (PPSV23) vaccine. One dose is recommended after age 52. Talk to your health care provider about which screenings and vaccines you need and how often you need them. This information is not intended to replace advice given to you by your health care provider. Make sure you discuss any questions you have with your health care provider. Document Released: 05/02/2015 Document Revised: 12/24/2015 Document Reviewed: 02/04/2015 Elsevier Interactive Patient Education  2017 [place] Prevention in the Home Falls can cause injuries. They can happen to people of all ages. There are many things you can do to make your home safe and to help prevent falls. What can I do on the outside of my home?  Regularly fix the edges of walkways and driveways and fix any cracks.  Remove anything that might make you trip as you walk through a door, such as a raised step or threshold.  Trim any bushes or trees on the path to your home.  Use bright outdoor lighting.  Clear any walking paths of anything that might make someone trip, such as rocks or tools.  Regularly check to see if handrails are loose or broken. Make sure that both sides of any steps have handrails.  Any raised decks and porches should have guardrails on the edges.  Have any leaves, snow, or ice cleared regularly.  Use sand or salt on walking paths during winter.  Clean up any spills in your garage right away. This includes oil or grease spills. What can I do in the bathroom?  Use night lights.  Install grab bars by the toilet and in the tub and shower. Do not use towel bars as grab bars.  Use non-skid mats or decals in the tub or shower.  If you need to sit down in the shower, use a plastic, non-slip stool.  Keep the floor dry. Clean up any water that spills on the  floor as soon as it happens.  Remove soap buildup in the tub or shower regularly.  Attach bath mats securely with double-sided non-slip rug tape.  Do not have throw rugs and other things on the floor that can make you trip. What can I do in the bedroom?  Use night lights.  Make sure that you have a light by your bed that is easy to reach.  Do not use any sheets or blankets that are too big for your bed. They should not hang down onto the floor.  Have a firm chair that has side arms. You can use this for support while you get dressed.  Do not have throw rugs and other things on the floor that can make you trip. What can I do in the kitchen?  Clean up any spills right away.  Avoid walking on wet floors.  Keep items that you use a lot in easy-to-reach places.  If you need to reach something above you, use a strong step stool that has a grab bar.  Keep electrical cords out of the way.  Do not use floor polish or wax that makes floors slippery. If you must use wax, use  non-skid floor wax.  Do not have throw rugs and other things on the floor that can make you trip. What can I do with my stairs?  Do not leave any items on the stairs.  Make sure that there are handrails on both sides of the stairs and use them. Fix handrails that are broken or loose. Make sure that handrails are as long as the stairways.  Check any carpeting to make sure that it is firmly attached to the stairs. Fix any carpet that is loose or worn.  Avoid having throw rugs at the top or bottom of the stairs. If you do have throw rugs, attach them to the floor with carpet tape.  Make sure that you have a light switch at the top of the stairs and the bottom of the stairs. If you do not have them, ask someone to add them for you. What else can I do to help prevent falls?  Wear shoes that:  Do not have high heels.  Have rubber bottoms.  Are comfortable and fit you well.  Are closed at the toe. Do not wear  sandals.  If you use a stepladder:  Make sure that it is fully opened. Do not climb a closed stepladder.  Make sure that both sides of the stepladder are locked into place.  Ask someone to hold it for you, if possible.  Clearly mark and make sure that you can see:  Any grab bars or handrails.  First and last steps.  Where the edge of each step is.  Use tools that help you move around (mobility aids) if they are needed. These include:  Canes.  Walkers.  Scooters.  Crutches.  Turn on the lights when you go into a dark area. Replace any light bulbs as soon as they burn out.  Set up your furniture so you have a clear path. Avoid moving your furniture around.  If any of your floors are uneven, fix them.  If there are any pets around you, be aware of where they are.  Review your medicines with your doctor. Some medicines can make you feel dizzy. This can increase your chance of falling. Ask your doctor what other things that you can do to help prevent falls. This information is not intended to replace advice given to you by your health care provider. Make sure you discuss any questions you have with your health care provider. Document Released: 01/30/2009 Document Revised: 09/11/2015 Document Reviewed: 05/10/2014 Elsevier Interactive Patient Education  2017 [employer].

## 2019-01-02 ENCOUNTER — Ambulatory Visit (INDEPENDENT_AMBULATORY_CARE_PROVIDER_SITE_OTHER): Payer: Medicare Other | Admitting: Pharmacist

## 2019-01-02 DIAGNOSIS — E119 Type 2 diabetes mellitus without complications: Secondary | ICD-10-CM | POA: Diagnosis not present

## 2019-01-02 NOTE — Chronic Care Management (AMB) (Signed)
Chronic Care Management   Note  01/02/2019 Name: Duane Hill. MRN: [id number] DOB: 1940-12-20   Subjective:  Duane Hill. is a 78 y.o. year old male who is a primary care patient of Crissman, Jeannette How, MD. The CCM team was consulted for assistance with chronic disease management and care coordination needs.    Contacted patient telephonically for medication management review today.   Duane Hill was given information about Chronic Care Management services today including:  1. CCM service includes personalized support from designated clinical staff supervised by his physician, including individualized plan of care and coordination with other care providers 2. 24/7 contact phone numbers for assistance for urgent and routine care needs. 3. Service will only be billed when office clinical staff spend 20 minutes or more in a month to coordinate care. 4. Only one practitioner may furnish and bill the service in a calendar month. 5. The patient may stop CCM services at any time (effective at the end of the month) by phone call to the office staff. 6. The patient will be responsible for cost sharing (co-pay) of up to 20% of the service fee (after annual deductible is met).  Patient agreed to services and verbal consent obtained.   Review of patient status, including review of consultants reports, laboratory and other test data, was performed as part of comprehensive evaluation and provision of chronic care management services.   Objective:  Lab Results  Component Value Date   CREATININE 1.26 09/12/2018   CREATININE 1.36 (H) 02/20/2018   CREATININE 1.25 06/09/2017    Lab Results  Component Value Date   HGBA1C 7.6 (H) 09/12/2018       Component Value Date/Time   CHOL 117 09/12/2018 1457   CHOL 141 05/25/2016 1032   TRIG 322 (H) 09/12/2018 1457   TRIG 280 (H) 05/25/2016 1032   HDL 23 (L) 09/12/2018 1457   CHOLHDL 5.1 (H) 09/12/2018 1457   VLDL 56 (H) 05/25/2016 1032   LDLCALC 30 09/12/2018 1457    Clinical ASCVD: No   BP Readings from Last 3 Encounters:  09/12/18 (!) 161/80  02/20/18 139/61  12/01/17 132/70    Allergies  Allergen Reactions  . Protonix [Pantoprazole Sodium] Nausea And Vomiting    Medications Reviewed Today    Reviewed by Bevelyn Ngo, LPN (Licensed Practical Nurse) on 12/06/18 at 1441  Med List Status: <None>  Medication Order Taking? Sig Documenting Provider Last Dose Status Informant  acetaminophen (TYLENOL) 500 MG tablet [id number] Yes Take 1,000 mg by mouth every 6 (six) hours as needed. Patient uss Caplet extra strength [provider] Taking Active   atorvastatin (LIPITOR) 20 MG tablet [id number] Yes Take 1 tablet (20 mg total) by mouth daily. Guadalupe Maple, MD Taking Active   Azelastine HCl 0.15 % SOLN [id number] Yes Place 2 sprays into both nostrils 2 (two) times daily. Place 2 sprays into both nostrils 2 (two) times daily. Guadalupe Maple, MD Taking Active   carvedilol (COREG) 25 MG tablet [id number] Yes Take 1 tablet (25 mg total) by mouth 2 (two) times daily with a meal. Guadalupe Maple, MD Taking Active   Choline Fenofibrate (FENOFIBRIC ACID) 135 MG CPDR [id number] Yes TAKE ONE CAPSULE DAILY. Guadalupe Maple, MD Taking Active   gabapentin (NEURONTIN) 300 MG capsule [id number] Yes Take 2 capsules (600 mg total) by mouth 2 (two) times daily. Guadalupe Maple, MD Taking Active   glucose blood test strip [id number] Yes CHECK BLOOD SUGAR  TWICE DAILY. [name], [place] Taking Active   hydrochlorothiazide (MICROZIDE) 12.5 MG capsule [id number] Yes Take 1 capsule (12.5 mg total) by mouth daily. Guadalupe Maple, MD Taking Active   metFORMIN (GLUCOPHAGE XR) 500 MG 24 hr tablet [id number] Yes Take 2 tablets (1,000 mg total) by mouth 2 (two) times daily at 8 am and 10 pm. Guadalupe Maple, MD Taking Active            Med Note (HILL, TIFFANY A   Wed Dec 06, 2018  2:34 PM) 1 in the am 1 pm   MICROLET LANCETS MISC  [id number] Yes USE TWICE DAILY AS DIRECTED. Guadalupe Maple, MD Taking Active   omeprazole (PRILOSEC) 40 MG capsule [id number] Yes Take 1 capsule (40 mg total) by mouth daily. Guadalupe Maple, MD Taking Active   quinapril (ACCUPRIL) 40 MG tablet [id number] Yes Take 1 tablet (40 mg total) by mouth daily. Guadalupe Maple, MD Taking Active   VENTOLIN HFA 108 [phone] Base) MCG/ACT inhaler [id number] Yes Take 2 puffs every 4-6 hours as necessary for shortness of breath Crissman, Jeannette How, MD Taking Active            Assessment:   Goals Addressed            This Visit's Progress     Patient Stated   . "I want to work on my sugars" (pt-stated)       Current Barriers:  . Diabetes: uncontrolled; most recent A1c 7.6%  o Son lives with him, but does not have a job. They do not have a car, so can only walk places locally. Notes that he wishes he could afford to have more fresh vegetables. Also has a difficult time with light bill and other utilities.  . Current antihyperglycemic regimen: metformin XR 500 mg BID o Higher doses cause significant GI upset  . Current meal patterns: o Breakfast: Bacon, country ham, 2 eggs; occasional grits o Lunch: Soup; handful of crackers;  o Supper: Loves potatoes; creamed, baked french fries; tries to eat a meat w/ every meal (hamburgers, hot dogs) o Snacks: Infrequently; maybe once per week will have ice cream; occasionally cookies o Drinks: 1/2 glass Dr. Malachi Bonds  . Current exercise: working in the backyard, cleaning the house; notes he has a dog that keeps him busy . Current blood glucose readings: fasting 140-150s . Cardiovascular risk reduction: o Current hypertensive regimen: carvedilol 25 mg BID, HCTZ 12.5 mg daily, quinapril 40 mg daily  o Current hyperlipidemia regimen: atorvastatin 20 mg daily   Pharmacist Clinical Goal(s):  Marland Kitchen Over the next 90 days, patient with work with PharmD and primary care provider to address optimized glycemic management   Interventions: . Comprehensive medication review performed, medication list updated in electronic medical record . Place a Connected Care referral for transportation, bill assistance, food assistance  . Reviewed goal A1c, fasting, and post prandial goals. Patient verbalized understanding . Intensive dietary education. Discussed reducing consumption of carbohydrates. Mailing patient the Healthy Meal Planning handout to review which vegetables are higher in carbohydrates.  . Discussed options moving forward for glycemic control. As he cannot tolerate higher doses of metformin, consider addition of Jardiance for glycemic benefit, as well as long term ASCVD and renal benefits. Appears patient will qualify for Jardiance assistance through [employer]. Will collaborate with Dr. Jeananne Rama on the above.   Patient Self Care Activities:  . Patient will check blood glucose daily, document, and provide at future appointments . Patient  will take medications as prescribed . Patient will report any questions or concerns to provider   Initial goal documentation        Plan: - Will collaborate with PCP and patient on patient assistance, if Dr. Jeananne Rama is amenable to Jardiance addition.  - Will outreach patient in the next 4-6 weeks for continued medication management support.   Catie Darnelle Maffucci, PharmD Clinical Pharmacist [place] [phone]

## 2019-01-02 NOTE — Addendum Note (Signed)
Addended by: De Hollingshead on: 01/02/2019 05:09 PM   Modules accepted: Orders

## 2019-01-02 NOTE — Patient Instructions (Addendum)
Mr. Duane Hill,   It was great talking with you today!  I will talk to Dr. Jeananne Rama regarding potentially adding Jardiance for your diabetes.   For a goal A1c of less than 7%, we are wanting your fasting sugars to be less than 130 and 2 hour after meal sugars less than 180.   I will reach out to the Care Guides regarding food assistance, transportation assistance, and bills.   Attached is a handout that talks about appropriate meal sizes and portions for different foods for diabetes.    Please call with any questions!  Catie Darnelle Maffucci, PharmD [phone]  Visit Information  Goals Addressed            This Visit's Progress     Patient Stated   . "I want to work on my sugars" (pt-stated)       Current Barriers:  . Diabetes: uncontrolled; most recent A1c 7.6%  o Son lives with him, but does not have a job. They do not have a car, so can only walk places locally. Notes that he wishes he could afford to have more fresh vegetables. Also has a difficult time with light bill and other utilities.  . Current antihyperglycemic regimen: metformin XR 500 mg BID o Higher doses cause significant GI upset  . Current meal patterns: o Breakfast: Bacon, country ham, 2 eggs; occasional grits o Lunch: Soup; handful of crackers;  o Supper: Loves potatoes; creamed, baked french fries; tries to eat a meat w/ every meal (hamburgers, hot dogs) o Snacks: Infrequently; maybe once per week will have ice cream; occasionally cookies o Drinks: 1/2 glass Dr. Malachi Bonds  . Current exercise: working in the backyard, cleaning the house; notes he has a dog that keeps him busy . Current blood glucose readings: fasting 140-150s . Cardiovascular risk reduction: o Current hypertensive regimen: carvedilol 25 mg BID, HCTZ 12.5 mg daily, quinapril 40 mg daily  o Current hyperlipidemia regimen: atorvastatin 20 mg daily   Pharmacist Clinical Goal(s):  Marland Kitchen Over the next 90 days, patient with work with PharmD and primary care  provider to address optimized glycemic management  Interventions: . Comprehensive medication review performed, medication list updated in electronic medical record . Place a Connected Care referral for transportation, bill assistance, food assistance  . Reviewed goal A1c, fasting, and post prandial goals. Patient verbalized understanding . Intensive dietary education. Discussed reducing consumption of carbohydrates. Mailing patient the Healthy Meal Planning handout to review which vegetables are higher in carbohydrates.  . Discussed options moving forward for glycemic control. As he cannot tolerate higher doses of metformin, consider addition of Jardiance for glycemic benefit, as well as long term ASCVD and renal benefits. Appears patient will qualify for Jardiance assistance through [employer]. Will collaborate with Dr. Jeananne Rama on the above.   Patient Self Care Activities:  . Patient will check blood glucose daily, document, and provide at future appointments . Patient will take medications as prescribed . Patient will report any questions or concerns to provider   Initial goal documentation        The patient verbalized understanding of instructions provided today and declined a print copy of patient instruction materials.    Plan: - Will collaborate with PCP and patient on patient assistance, if Dr. Jeananne Rama is amenable to Jardiance addition.  - Will outreach patient in the next 4-6 weeks for continued medication management support.   Catie Darnelle Maffucci, PharmD Clinical Pharmacist [place] [phone]

## 2019-02-06 ENCOUNTER — Telehealth: Payer: Self-pay

## 2019-02-06 ENCOUNTER — Telehealth: Payer: Self-pay | Admitting: Family Medicine

## 2019-02-06 ENCOUNTER — Ambulatory Visit: Payer: Self-pay | Admitting: Pharmacist

## 2019-02-06 NOTE — Telephone Encounter (Signed)
Pt stated that he was taking 4 a day prescription and it upset his stomach so he cut dosage in 1/2 and it caused his sugar levels to spike. A1C is now over 7 he believes. Catie, he mentioned that you may know of another drug that could be substituted and were going to check with Dr. Jeananne Rama could you please let him know?  Thanks! Curt Bears

## 2019-02-06 NOTE — Telephone Encounter (Signed)
° ° °  Called pt regarding [employer] Referral for financial assistance with utilities, transportation, and grocery/food. Pt stated that he does not have a car and that he or his son walk where they need to go gets most of his food from the [place] and his 76 yr old sister (who lives next door) will take him occasionally to pick up groceries. His son has mental impairments and is not working receives $200 in [employer] and receives disability. Mr. Watland stated that his son is protective of him and does not like him walking very far and he helps him keep the yard up and other chores. Is not sure how long he will be living with him as he may be moving to [place] for awhile eventually. Has lived in his home for over 50 years but took out a 2nd mortgage 15 years ago to make repairs. Got behind on the loan re-payment and is working on getting that paid off. Mortgage - $530 a month Utilities - $200 Cable/Internet $100 Water - $50 Cellphone $20 prepay phone Rx - $100/month  -Will apply for [employer] to assist with mortgage payment -Email [employer] meal calendar and list of food pantrys -[employer] information -Information on [employer] enhanced services (doesn't feel like he needs it at this point but would like to have information) -declined MOW as he enjoys cooking on his own and likes fresh vegetables and his own food.  [place]  ??[email or web address]   ??[id number]

## 2019-02-06 NOTE — Chronic Care Management (AMB) (Signed)
  Chronic Care Management   Note  02/06/2019 Name: Duane Hill. MRN: [id number] DOB: [birth year]  Duane Hill. is a 78 y.o. year old male who is a primary care patient of Crissman, Jeannette How, MD. The CCM team was consulted for assistance with chronic disease management and care coordination needs.    Attempted to contact patient to follow up on medication management needs. Left HIPAA compliant message for patient to return my call at his convenience.   Follow up plan: - If I do not hear back, will outreach again in the next 3-5 weeks  Catie Darnelle Maffucci, PharmD Clinical Pharmacist [place] [phone]

## 2019-02-06 NOTE — Telephone Encounter (Signed)
See note from 01/02/2019. Would recommend addition of SGLT2 d/t CKD. Patient would qualify for Jardiance patient assistance.   Dr. Jeananne Rama, thoughts? Would you like patient to establish with another provider for further glycemic optimization?  Catie

## 2019-02-07 ENCOUNTER — Telehealth: Payer: Self-pay

## 2019-02-07 NOTE — Telephone Encounter (Signed)
FYI

## 2019-02-07 NOTE — Telephone Encounter (Signed)
Called pt scheduled pt for 10/28 with Apolonio Schneiders

## 2019-02-07 NOTE — Telephone Encounter (Signed)
Please get patient scheduled for office visit with any of [place] for DM thanks!

## 2019-02-14 ENCOUNTER — Encounter: Payer: Self-pay | Admitting: Family Medicine

## 2019-02-14 ENCOUNTER — Other Ambulatory Visit: Payer: Self-pay

## 2019-02-14 ENCOUNTER — Ambulatory Visit (INDEPENDENT_AMBULATORY_CARE_PROVIDER_SITE_OTHER): Payer: Medicare Other | Admitting: Pharmacist

## 2019-02-14 ENCOUNTER — Ambulatory Visit (INDEPENDENT_AMBULATORY_CARE_PROVIDER_SITE_OTHER): Payer: Medicare Other | Admitting: Family Medicine

## 2019-02-14 ENCOUNTER — Ambulatory Visit: Payer: Self-pay | Admitting: *Deleted

## 2019-02-14 VITALS — BP 152/62 | HR 56 | Temp 99.5°F | Ht 68.0 in | Wt 173.0 lb

## 2019-02-14 DIAGNOSIS — J309 Allergic rhinitis, unspecified: Secondary | ICD-10-CM | POA: Insufficient documentation

## 2019-02-14 DIAGNOSIS — I10 Essential (primary) hypertension: Secondary | ICD-10-CM | POA: Diagnosis not present

## 2019-02-14 DIAGNOSIS — E119 Type 2 diabetes mellitus without complications: Secondary | ICD-10-CM

## 2019-02-14 DIAGNOSIS — E1159 Type 2 diabetes mellitus with other circulatory complications: Secondary | ICD-10-CM | POA: Diagnosis not present

## 2019-02-14 DIAGNOSIS — J3089 Other allergic rhinitis: Secondary | ICD-10-CM | POA: Diagnosis not present

## 2019-02-14 DIAGNOSIS — Z23 Encounter for immunization: Secondary | ICD-10-CM

## 2019-02-14 DIAGNOSIS — E1165 Type 2 diabetes mellitus with hyperglycemia: Secondary | ICD-10-CM

## 2019-02-14 MED ORDER — CETIRIZINE HCL 10 MG PO TABS: 10.0000 mg | ORAL_TABLET | Freq: Every day | ORAL | 11 refills | Status: DC

## 2019-02-14 MED ORDER — EMPAGLIFLOZIN 10 MG PO TABS: 10.0000 mg | ORAL_TABLET | Freq: Every day | ORAL | 2 refills | Status: DC

## 2019-02-14 MED ORDER — METFORMIN HCL ER 500 MG PO TB24: 500.0000 mg | ORAL_TABLET | Freq: Every day | ORAL | 0 refills | Status: DC

## 2019-02-14 MED ORDER — FLUTICASONE PROPIONATE 50 MCG/ACT NA SUSP: 1.0000 | Freq: Two times a day (BID) | NASAL | 6 refills | Status: DC

## 2019-02-14 NOTE — Patient Instructions (Addendum)
Mr. Drumm,   It was great to see you today!  We are going to apply for Jardiance through patient assistance. In addition to the application, we need to submit proof of income. If you could bring (or mail, but this can take a few weeks to get through the mail system right now) a copy of something that shows how much you receive from [employer] and you retirement from the state.   Once I have that, I will fax this to the drug company. It takes a few weeks for approval and shipping to you; my pharmacy technician, Susy Frizzle, helps me with follow up and may call you.   Please call me with any questions!  Visit Information  Goals Addressed            This Visit's Progress     Patient Stated   . "I want to work on my sugars" (pt-stated)       Current Barriers:  . Diabetes: uncontrolled; most recent A1c 7.6%  o Transportation is a concern; does not have a car, walks everywhere o Patient does note concerns with nasal congestion. Notes he has always struggled with allergies and asthma, previously saw allergist in [place]. Notes he stays congested, using Astelin nasal spray BID, but still using multiple q-tips daily  . Current antihyperglycemic regimen: metformin XR 500 mg BID o Higher doses cause significant GI upset; notes he already has diarrhea 3-4 times every morning . Current exercise: very busy around the house, outdoors . Current blood glucose readings: fasting 140-150s . Cardiovascular risk reduction: o Current hypertensive regimen: carvedilol 25 mg BID, HCTZ 12.5 mg daily, quinapril 40 mg daily  o Current hyperlipidemia regimen: atorvastatin 20 mg daily   Pharmacist Clinical Goal(s):  Marland Kitchen Over the next 90 days, patient with work with PharmD and primary care provider to address optimized glycemic management  Interventions: . Comprehensive medication review performed, medication list updated in electronic medical record . Discussed that first line recommendation for  consistent nasal congestion would be oral antihistamine, possibly with fluticasone nasal spray. Recommended to Merrie Roof, PA - started today . Discussed addition of Jardiance to metformin. Discussed patient assistance income requirements - patient qualifies for assistance. Collaborated with patient and provider for signature. Patient to bring proof of income by clinic for inclusion with application; once all parts received, will fax to [place] . Noted that Merrie Roof recommended BP monitoring. Will collaborate w/ Merlene Morse Minor, RN regarding contacting [employer] to see if they have support options for BP meters . Patient notes that he spoke with Care Guide regarding resources, but he notes that he did not receive the resource information she was going to send him. Will follow up with Care Guide regarding this.   Patient Self Care Activities:  . Patient will check blood glucose daily, document, and provide at future appointments . Patient will take medications as prescribed . Patient will report any questions or concerns to provider   Please see past updates related to this goal by clicking on the "Past Updates" button in the selected goal         Print copy of patient instructions provided.   Plan:  - Will collaborate with patient on Jardiance application  - Will collaborate with Janci Minor, RN and Jill Alexanders for resource support  Catie Darnelle Maffucci, PharmD Clinical Pharmacist [place] [phone]

## 2019-02-14 NOTE — Progress Notes (Signed)
BP (!) 152/62   Pulse (!) 56   Temp 99.5 F (37.5 C) (Oral)   Ht 5\' 8"  (1.727 m)   Wt 173 lb (78.5 kg)   SpO2 97%   BMI 26.30 kg/m    Subjective:    Patient ID: Duane Edelman., male    DOB: 17-Dec-1940, 78 y.o.   MRN: [id number]  HPI: Duane Hill. is a 78 y.o. male  Chief Complaint  Patient presents with  . Hypertension  . Diabetes   Patient here today for 3 month DM f/u.   HTN - does not have a monitor to be checking BPs at home. Takes his medicines faithfully without side effects. Does not follow a particular diet or exercise regularly. Denies CP, SOB, HAs, dizziness.   DM - BSs are running around 160s fasting while on metformin 500 mg BID. Stomach seems to be hurting worse since being on the higher dose of the metformin. Diarrhea several times in the mornings. Does not follow a particular diet at this time. No low blood sugar spells.   Having significant rhinorrhea and sinus pressure with the ragweed season. Using qtips constantly throughout the day to help clear his nose and using astelin nasal spray.   Relevant past medical, surgical, family and social history reviewed and updated as indicated. Interim medical history since our last visit reviewed. Allergies and medications reviewed and updated.  Review of Systems  Per HPI unless specifically indicated above     Objective:    BP (!) 152/62   Pulse (!) 56   Temp 99.5 F (37.5 C) (Oral)   Ht 5\' 8"  (1.727 m)   Wt 173 lb (78.5 kg)   SpO2 97%   BMI 26.30 kg/m   Wt Readings from Last 3 Encounters:  02/14/19 173 lb (78.5 kg)  09/12/18 176 lb (79.8 kg)  02/20/18 166 lb (75.3 kg)    Physical Exam Vitals signs and nursing note reviewed.  Constitutional:      Appearance: Normal appearance.  HENT:     Head: Atraumatic.     Right Ear: Tympanic membrane normal.     Left Ear: Tympanic membrane normal.     Nose: Rhinorrhea present.     Mouth/Throat:     Pharynx: Posterior oropharyngeal erythema  present.  Eyes:     Extraocular Movements: Extraocular movements intact.     Conjunctiva/sclera: Conjunctivae normal.  Neck:     Musculoskeletal: Normal range of motion and neck supple.  Cardiovascular:     Rate and Rhythm: Normal rate and regular rhythm.  Pulmonary:     Effort: Pulmonary effort is normal.     Breath sounds: Normal breath sounds.  Musculoskeletal: Normal range of motion.  Skin:    General: Skin is warm and dry.  Neurological:     General: No focal deficit present.     Mental Status: He is oriented to person, place, and time.  Psychiatric:        Mood and Affect: Mood normal.        Thought Content: Thought content normal.        Judgment: Judgment normal.     Results for orders placed or performed in visit on 12/04/18  HM DIABETES EYE EXAM  Result Value Ref Range   HM Diabetic Eye Exam No Retinopathy No Retinopathy      Assessment & Plan:   Problem List Items Addressed This Visit      Cardiovascular and Mediastinum  Hypertension associated with diabetes ([place])    BPs mildly elevated today after several checks. Will provide script for home BP monitor and have him start logging home readings. Call with persistent elevated readings, and will recheck log and in office reading at upcoming f/u appt in 3 months. May need to increase antihypertensive regimen if persistently elevated. Per record review, readings are typically high normal or just above. DASH diet, exercise and stress reduction reviewed with patient      Relevant Medications   empagliflozin (JARDIANCE) 10 MG TABS tablet   metFORMIN (GLUCOPHAGE XR) 500 MG 24 hr tablet     Respiratory   Allergic rhinitis    Will start zyrtec and flonase regimen, may use astelin spray prn. Sinus rinses recommended prn as well        Endocrine   Type 2 diabetes mellitus with hyperglycemia [facility])    Working with [place] and Nursing team for diabetic nutrition education as well as prescription coverage options.  Will reduce metformin XR to 500 mg daily due to diarrhea with higher doses and add jardiance. Work on diet and exercise and recheck in 3 months.       Relevant Medications   empagliflozin (JARDIANCE) 10 MG TABS tablet   metFORMIN (GLUCOPHAGE XR) 500 MG 24 hr tablet    Other Visit Diagnoses    Type 2 diabetes mellitus without complication, without long-term current use of insulin ([facility])    -  Primary   Relevant Medications   empagliflozin (JARDIANCE) 10 MG TABS tablet   metFORMIN (GLUCOPHAGE XR) 500 MG 24 hr tablet   Other Relevant Orders   HgB A1c   Flu vaccine need       Relevant Orders   Flu Vaccine QUAD High Dose(Fluad) (Completed)       Follow up plan: Return in about 3 months (around 05/17/2019) for DM, HTN f/u.

## 2019-02-14 NOTE — Assessment & Plan Note (Signed)
Working with [place] and Nursing team for diabetic nutrition education as well as prescription coverage options. Will reduce metformin XR to 500 mg daily due to diarrhea with higher doses and add jardiance. Work on diet and exercise and recheck in 3 months.

## 2019-02-14 NOTE — Assessment & Plan Note (Signed)
Will start zyrtec and flonase regimen, may use astelin spray prn. Sinus rinses recommended prn as well

## 2019-02-14 NOTE — Chronic Care Management (AMB) (Signed)
Chronic Care Management   Follow Up Note   02/14/2019 Name: Duane S Kalmar Jr. MRN: [id number] DOB: 08/25/1940  Referred by: Crissman, Mark A, MD Reason for referral : Chronic Care Management (Medication Management)   Duane S Dewberry Jr. is a 78 y.o. year old male who is a primary care patient of Crissman, Mark A, MD. The CCM team was consulted for assistance with chronic disease management and care coordination needs.    Met with patient face to face prior to appointment with Duane Lane, PA today.   Review of patient status, including review of consultants reports, relevant laboratory and other test results, and collaboration with appropriate care team members and the patient's provider was performed as part of comprehensive patient evaluation and provision of chronic care management services.    SDOH (Social Determinants of Health) screening performed today: Financial Strain . See Care Plan for related entries.   Advanced Directives Status: N See Care Plan and Vynca application for related entries.  Outpatient Encounter Medications as of 02/14/2019  Medication Sig Note  . acetaminophen (TYLENOL) 500 MG tablet Take 1,000 mg by mouth every 6 (six) hours as needed. Patient uss Caplet extra strength   . atorvastatin (LIPITOR) 20 MG tablet Take 1 tablet (20 mg total) by mouth daily.   . Azelastine HCl 0.15 % SOLN Place 2 sprays into both nostrils 2 (two) times daily. Place 2 sprays into both nostrils 2 (two) times daily.   . carvedilol (COREG) 25 MG tablet Take 1 tablet (25 mg total) by mouth 2 (two) times daily with a meal.   . Choline Fenofibrate (FENOFIBRIC ACID) 135 MG CPDR TAKE ONE CAPSULE DAILY.   . gabapentin (NEURONTIN) 300 MG capsule Take 2 capsules (600 mg total) by mouth 2 (two) times daily.   . glucose blood test strip CHECK BLOOD SUGAR TWICE DAILY.   . hydrochlorothiazide (MICROZIDE) 12.5 MG capsule Take 1 capsule (12.5 mg total) by mouth daily.   . MICROLET LANCETS MISC  USE TWICE DAILY AS DIRECTED.   . omeprazole (PRILOSEC) 40 MG capsule Take 1 capsule (40 mg total) by mouth daily.   . quinapril (ACCUPRIL) 40 MG tablet Take 1 tablet (40 mg total) by mouth daily.   . VENTOLIN HFA 108 (90 Base) MCG/ACT inhaler Take 2 puffs every 4-6 hours as necessary for shortness of breath   . [DISCONTINUED] metFORMIN (GLUCOPHAGE XR) 500 MG 24 hr tablet Take 2 tablets (1,000 mg total) by mouth 2 (two) times daily at 8 am and 10 pm. 12/06/2018: 1 in the am 1 pm    No facility-administered encounter medications on file as of 02/14/2019.      Goals Addressed            This Visit's Progress     Patient Stated   . "I want to work on my sugars" (pt-stated)       Current Barriers:  . Diabetes: uncontrolled; most recent A1c 7.6%  o Transportation is a concern; does not have a car, walks everywhere o Patient does note concerns with nasal congestion. Notes he has always struggled with allergies and asthma, previously saw allergist in [place]. Notes he stays congested, using Astelin nasal spray BID, but still using multiple q-tips daily  . Current antihyperglycemic regimen: metformin XR 500 mg BID o Higher doses cause significant GI upset; notes he already has diarrhea 3-4 times every morning . Current exercise: very busy around the house, outdoors . Current blood glucose readings: fasting 140-150s . Cardiovascular   risk reduction: o Current hypertensive regimen: carvedilol 25 mg BID, HCTZ 12.5 mg daily, quinapril 40 mg daily  o Current hyperlipidemia regimen: atorvastatin 20 mg daily   Pharmacist Clinical Goal(s):  . Over the next 90 days, patient with work with PharmD and primary care provider to address optimized glycemic management  Interventions: . Comprehensive medication review performed, medication list updated in electronic medical record . Discussed that first line recommendation for consistent nasal congestion would be oral antihistamine, possibly with  fluticasone nasal spray. Recommended to Duane Lane, PA - started today . Discussed addition of Jardiance to metformin. Discussed patient assistance income requirements - patient qualifies for assistance. Collaborated with patient and provider for signature. Patient to bring proof of income by clinic for inclusion with application; once all parts received, will fax to [employer] . Noted that Duane Hill recommended BP monitoring. Will collaborate w/ Duane Minor, RN regarding contacting [employer] to see if they have support options for BP meters . Patient notes that he spoke with Care Guide regarding resources, but he notes that he did not receive the resource information she was going to send him. Will follow up with Care Guide regarding this.   Patient Self Care Activities:  . Patient will check blood glucose daily, document, and provide at future appointments . Patient will take medications as prescribed . Patient will report any questions or concerns to provider   Please see past updates related to this goal by clicking on the "Past Updates" button in the selected goal         Plan:  - Will collaborate with patient on Jardiance application  - Will collaborate with Duane Minor, RN and Duane Hill for resource support  Catie , PharmD Clinical Pharmacist [facility] [phone]   

## 2019-02-14 NOTE — Assessment & Plan Note (Signed)
BPs mildly elevated today after several checks. Will provide script for home BP monitor and have him start logging home readings. Call with persistent elevated readings, and will recheck log and in office reading at upcoming f/u appt in 3 months. May need to increase antihypertensive regimen if persistently elevated. Per record review, readings are typically high normal or just above. DASH diet, exercise and stress reduction reviewed with patient

## 2019-02-15 LAB — COMPREHENSIVE METABOLIC PANEL
ALT: 29 IU/L (ref 0–44)
AST: 25 IU/L (ref 0–40)
Albumin/Globulin Ratio: 2 (ref 1.2–2.2)
Albumin: 4.4 g/dL (ref 3.7–4.7)
Alkaline Phosphatase: 47 IU/L (ref 39–117)
BUN/Creatinine Ratio: 10 (ref 10–24)
BUN: 14 mg/dL (ref 8–27)
Bilirubin Total: 0.3 mg/dL (ref 0.0–1.2)
CO2: 24 mmol/L (ref 20–29)
Calcium: 9.7 mg/dL (ref 8.6–10.2)
Chloride: 102 mmol/L (ref 96–106)
Creatinine, Ser: 1.34 mg/dL — ABNORMAL HIGH (ref 0.76–1.27)
GFR calc Af Amer: 59 mL/min/{1.73_m2} — ABNORMAL LOW (ref >59)
GFR calc non Af Amer: 51 mL/min/{1.73_m2} — ABNORMAL LOW (ref >59)
Globulin, Total: 2.2 g/dL (ref 1.5–4.5)
Glucose: 144 mg/dL — ABNORMAL HIGH (ref 65–99)
Potassium: 4.8 mmol/L (ref 3.5–5.2)
Sodium: 139 mmol/L (ref 134–144)
Total Protein: 6.6 g/dL (ref 6.0–8.5)

## 2019-02-15 LAB — HEMOGLOBIN A1C
Est. average glucose Bld gHb Est-mCnc: 180 mg/dL
Hgb A1c MFr Bld: 7.9 % — ABNORMAL HIGH (ref 4.8–5.6)

## 2019-02-15 NOTE — Chronic Care Management (AMB) (Signed)
Chronic Care Management   Follow Up Note   02/15/2019 Name: Duane Hill. MRN: [id number] DOB: 06-27-1940  Referred by: Guadalupe Maple, MD Reason for referral : No chief complaint on file.   Duane Hill. is a 78 y.o. year old male who is a primary care patient of Crissman, Jeannette How, MD. The CCM team was consulted for assistance with chronic disease management and care coordination needs.    Review of patient status, including review of consultants reports, relevant laboratory and other test results, and collaboration with appropriate care team members and the patient's provider was performed as part of comprehensive patient evaluation and provision of chronic care management services.    SDOH (Social Determinants of Health) screening performed today: None. See Care Plan for related entries.   Advanced Directives Status: N See Care Plan and Vynca application for related entries.  Outpatient Encounter Medications as of 02/14/2019  Medication Sig Note  . acetaminophen (TYLENOL) 500 MG tablet Take 1,000 mg by mouth every 6 (six) hours as needed. Patient uss Caplet extra strength   . atorvastatin (LIPITOR) 20 MG tablet Take 1 tablet (20 mg total) by mouth daily.   . Azelastine HCl 0.15 % SOLN Place 2 sprays into both nostrils 2 (two) times daily. Place 2 sprays into both nostrils 2 (two) times daily.   . carvedilol (COREG) 25 MG tablet Take 1 tablet (25 mg total) by mouth 2 (two) times daily with a meal.   . cetirizine (ZYRTEC) 10 MG tablet Take 1 tablet (10 mg total) by mouth daily.   . Choline Fenofibrate (FENOFIBRIC ACID) 135 MG CPDR TAKE ONE CAPSULE DAILY.   Marland Kitchen empagliflozin (JARDIANCE) 10 MG TABS tablet Take 10 mg by mouth daily before breakfast.   . fluticasone (FLONASE) 50 MCG/ACT nasal spray Place 1 spray into both nostrils 2 (two) times daily.   Marland Kitchen gabapentin (NEURONTIN) 300 MG capsule Take 2 capsules (600 mg total) by mouth 2 (two) times daily.   Marland Kitchen glucose blood test  strip CHECK BLOOD SUGAR TWICE DAILY.   . hydrochlorothiazide (MICROZIDE) 12.5 MG capsule Take 1 capsule (12.5 mg total) by mouth daily.   . metFORMIN (GLUCOPHAGE XR) 500 MG 24 hr tablet Take 1 tablet (500 mg total) by mouth daily with breakfast.   . MICROLET LANCETS MISC USE TWICE DAILY AS DIRECTED.   Marland Kitchen omeprazole (PRILOSEC) 40 MG capsule Take 1 capsule (40 mg total) by mouth daily.   . quinapril (ACCUPRIL) 40 MG tablet Take 1 tablet (40 mg total) by mouth daily.   . VENTOLIN HFA 108 (90 Base) MCG/ACT inhaler Take 2 puffs every 4-6 hours as necessary for shortness of breath   . [DISCONTINUED] metFORMIN (GLUCOPHAGE XR) 500 MG 24 hr tablet Take 2 tablets (1,000 mg total) by mouth 2 (two) times daily at 8 am and 10 pm. 12/06/2018: 1 in the am 1 pm    No facility-administered encounter medications on file as of 02/14/2019.      Goals Addressed            This Visit's Progress   . Diabetes (pt-stated)       Current Barriers:  Marland Kitchen Knowledge Deficits related to basic Diabetes pathophysiology and self care/management  Case Manager Clinical Goal(s):  Over the next 90 days, patient will demonstrate improved adherence to prescribed treatment plan for diabetes self care/management as evidenced by:  . daily monitoring and recording of CBG  . adherence to ADA/ carb modified diet . exercise  3 days/week . adherence to prescribed medication regimen  Interventions:  . Discussed plans with patient for ongoing care management follow up and provided patient with direct contact information for care management team . Met with patient briefly today while at PCP office.  . Noted patient was given a script for b/p monitor will f/u to see if patient was able to obtain this through his insurance.   Patient Self Care Activities:  . Attends all scheduled provider appointments . Today's A1C 7.9 . B/P 152/62    Initial goal documentation         The care management team will reach out to the patient again  over the next 30 days.  The patient has been provided with contact information for the care management team and has been advised to call with any health related questions or concerns.    Merlene Morse Jessaca Philippi RN, BSN Nurse Case Editor, [facility]  [phone]) Business Mobile

## 2019-02-15 NOTE — Patient Instructions (Signed)
Thank you allowing the Chronic Care Management Team to be a part of your care! It was a pleasure speaking with you today!  CCM (Chronic Care Management) Team   Giorgia Wahler RN, BSN Nurse Care Coordinator  [phone]  Catie [facility] PharmD  Clinical Pharmacist  [phone]  Eula Fried LCSW Clinical Social Worker [phone]  Goals Addressed            This Visit's Progress   . Diabetes (pt-stated)       Current Barriers:  Marland Kitchen Knowledge Deficits related to basic Diabetes pathophysiology and self care/management  Case Manager Clinical Goal(s):  Over the next 90 days, patient will demonstrate improved adherence to prescribed treatment plan for diabetes self care/management as evidenced by:  . daily monitoring and recording of CBG  . adherence to ADA/ carb modified diet . exercise 3 days/week . adherence to prescribed medication regimen  Interventions:  . Discussed plans with patient for ongoing care management follow up and provided patient with direct contact information for care management team . Met with patient briefly today while at PCP office.  . Noted patient was given a script for b/p monitor will f/u to see if patient was able to obtain this through his insurance.   Patient Self Care Activities:  . Attends all scheduled provider appointments . Today's A1C 7.9 . B/P 152/62    Initial goal documentation        The patient verbalized understanding of instructions provided today and declined a print copy of patient instruction materials.   The patient has been provided with contact information for the care management team and has been advised to call with any health related questions or concerns.

## 2019-02-22 NOTE — Telephone Encounter (Signed)
From: Jill Alexanders [facility])  To: [email or web address] Subject: [place] Afternoon [name], See attached information regarding food resources in [place].  If you need help with grocery delivery to your home please call [place] at  [phone], Monday through Friday from 8 a.m. - 5:00 p.m.   During the Coronavirus outbreak, [place] is offering enhanced services to ensure the well-being of older adults. In addition to information and referral to resources, these enhanced services include daily telephone well-being checks, grocery shopping and delivery, medication pick-up/delivery, linkage to transportation for essential medical appointments, and other assistance identified as a need that may be met.  To qualify for Enhanced Services, the older adult must be 90 years of age or older, and an [place] resident without local family or other support  For transportation needs www.LinkTransit.org Call 336-222-LINK 4315804666)  If you have any further questions please call me at [phone].  Thank you,  [place]  ??[email or web address]   ??Curt Bears

## 2019-02-23 NOTE — Telephone Encounter (Signed)
° ° °  Called pt regarding Insurance account manager for [employer] and Electrical engineer for [place] [phone]. [place] Andrew  ??[email or web address]   ??[id number]

## 2019-02-28 ENCOUNTER — Ambulatory Visit: Payer: Self-pay | Admitting: Pharmacist

## 2019-02-28 NOTE — Chronic Care Management (AMB) (Signed)
  Chronic Care Management   Note  02/28/2019 Name: Duane Hill. MRN: [id number] DOB: June 01, 1940  Duane Hill. is a 78 y.o. year old male who is a primary care patient of Crissman, Jeannette How, MD. The CCM team was consulted for assistance with chronic disease management and care coordination needs.    Contacted patient to follow up on starting Jardiance, and to follow up on if he has been able to find income information necessary to pursue assistance.   Follow up plan: - Will outreach patient in the next 3-4 weeks for continued medication management  Catie Darnelle Maffucci, PharmD Clinical Pharmacist [place] [phone]

## 2019-03-06 ENCOUNTER — Encounter: Payer: Self-pay | Admitting: Family Medicine

## 2019-03-07 ENCOUNTER — Other Ambulatory Visit: Payer: Self-pay

## 2019-03-07 DIAGNOSIS — I10 Essential (primary) hypertension: Secondary | ICD-10-CM

## 2019-03-07 MED ORDER — HYDROCHLOROTHIAZIDE 12.5 MG PO CAPS: 12.5000 mg | ORAL_CAPSULE | Freq: Every day | ORAL | 1 refills | Status: DC

## 2019-03-07 MED ORDER — QUINAPRIL HCL 40 MG PO TABS: 40.0000 mg | ORAL_TABLET | Freq: Every day | ORAL | 1 refills | Status: DC

## 2019-03-07 NOTE — Telephone Encounter (Signed)
[facility] faxed Rx refill request on Hydrochlorothiszide 12.5 mg

## 2019-03-09 MED ORDER — GLIPIZIDE 5 MG PO TABS: 5.0000 mg | ORAL_TABLET | Freq: Two times a day (BID) | ORAL | 2 refills | Status: DC

## 2019-03-09 NOTE — Telephone Encounter (Signed)
Returned patient call, he has d/c'd jardiance due to some side effects of blisters on groin and thigh area as well as being nervous about side effects when he read about the medicine. Also copay was 30 dollars and not doable for him. Will start glipizide and monitor closely. Discussed to call if having low blood sugar spells.   Also notes while on the phone that he's started back with hematuria and passing blood clots which is what happened several years ago after his prostate surgery and radiation treatments. Was evaluated and treated by Urology in 2016 for this with resolution up until now. Not having dysuria, fevers, abdominal pain and able to pass urine with good stream. Discussed to drink plenty of fluids and come in Monday for eval and referral back to Urology if not resolved.

## 2019-03-13 ENCOUNTER — Telehealth: Payer: Self-pay

## 2019-03-13 ENCOUNTER — Ambulatory Visit: Payer: Self-pay | Admitting: Pharmacist

## 2019-03-13 ENCOUNTER — Encounter: Payer: Self-pay | Admitting: Pharmacist

## 2019-03-13 NOTE — Chronic Care Management (AMB) (Signed)
  Chronic Care Management   Note  03/13/2019 Name: Duane Hill. MRN: [id number] DOB: Aug 23, 1940  Duane Hill. is a 78 y.o. year old male who is a primary care patient of Crissman, Jeannette How, MD. The CCM team was consulted for assistance with chronic disease management and care coordination needs.    Attempted to contact patient to follow up on medication management needs. Was unable to leave a voicemail; sent a MyChart message asking the patient to return my call at his convenience.   Follow up plan: - If I do not hear back, will outreach again in the next 4-5 weeks for continued medication management support  Catie Darnelle Maffucci, PharmD, [place] [phone]

## 2019-03-19 ENCOUNTER — Ambulatory Visit: Payer: Medicare Other | Admitting: Family Medicine

## 2019-03-20 ENCOUNTER — Telehealth: Payer: Self-pay

## 2019-04-18 ENCOUNTER — Telehealth: Payer: Self-pay

## 2019-04-18 ENCOUNTER — Ambulatory Visit: Payer: Self-pay | Admitting: Pharmacist

## 2019-04-18 NOTE — Chronic Care Management (AMB) (Signed)
  Chronic Care Management   Note  04/18/2019 Name: Duane Hill. MRN: [id number] DOB: [birth year]  [name]. is a 78 y.o. year old male who is a primary care patient of Crissman, Jeannette How, MD. The CCM team was consulted for assistance with chronic disease management and care coordination needs.    Attempted to contact patient for medication management review; left HIPAA compliant message for him to return my call at his convenience.   Follow up plan: - Will collaborate w/ Care Guide to outreach patient to reschedule follow up phone appointment with me.   Catie Darnelle Maffucci, PharmD, [place] [phone]

## 2019-04-23 ENCOUNTER — Telehealth: Payer: Self-pay | Admitting: Family Medicine

## 2019-04-23 NOTE — Chronic Care Management (AMB) (Signed)
  Care Management   Note  04/23/2019 Name: Duane Hill. MRN: [id number] DOB: 07/13/1940  Duane Hill. is a 79 y.o. year old male who is a primary care patient of Crissman, Jeannette How, MD and is actively engaged with the care management team. I reached out to Duane Hill. by phone today to assist with re-scheduling a follow up appointment with the Pharmacist  Follow up plan: Telephone appointment with care management team member scheduled for: 06/19/2019  [facility] [place] [place] [facility]  [place], [place] [address] Direct Dial: [email or web address]  Website: [email or web address]

## 2019-05-09 ENCOUNTER — Telehealth: Payer: Self-pay

## 2019-05-10 ENCOUNTER — Encounter: Payer: Self-pay | Admitting: Family Medicine

## 2019-05-10 ENCOUNTER — Ambulatory Visit (INDEPENDENT_AMBULATORY_CARE_PROVIDER_SITE_OTHER): Payer: Medicare Other | Admitting: Family Medicine

## 2019-05-10 VITALS — Ht 67.0 in | Wt 173.0 lb

## 2019-05-10 DIAGNOSIS — N3040 Irradiation cystitis without hematuria: Secondary | ICD-10-CM | POA: Diagnosis not present

## 2019-05-10 DIAGNOSIS — R300 Dysuria: Secondary | ICD-10-CM

## 2019-05-10 DIAGNOSIS — B009 Herpesviral infection, unspecified: Secondary | ICD-10-CM

## 2019-05-10 MED ORDER — VALACYCLOVIR HCL 1 G PO TABS: 1000.0000 mg | ORAL_TABLET | Freq: Two times a day (BID) | ORAL | 0 refills | Status: DC

## 2019-05-10 NOTE — Progress Notes (Signed)
Ht 5\' 7"  (1.702 m)   Wt 173 lb (78.5 kg)   BMI 27.10 kg/m    Subjective:    Patient ID: Duane Hill., male    DOB: [birth year], 79 y.o.   MRN: [id number]  HPI: Duane Hill. is a 79 y.o. male  Chief Complaint  Patient presents with  . Dysuria    pt has seen some blood in urine few days ago   . Urinary Frequency  . Medication concerns    patient stated that jardiance caused him some rashes/blisters on bilateral groin and urine issues    . This visit was completed via telephone due to the restrictions of the COVID-19 pandemic. All issues as above were discussed and addressed but no physical exam was performed. If it was felt that the patient should be evaluated in the office, they were directed there. The patient verbally consented to this visit. Patient was unable to complete an audio/visual visit due to Technical difficulties,Lack of internet. Due to the catastrophic nature of the COVID-19 pandemic, this visit was done through audio contact only. . Location of the patient: home . Location of the provider: work . Those involved with this call:  . Provider: Merrie Roof, PA-C . CMA: Lesle Chris, [place] . Front Desk/Registration: Jill Side  . Time spent on call: 15 minutes on the phone discussing health concerns. 10 minutes total spent in review of patient's record and preparation of their chart. I verified patient identity using two factors (patient name and date of birth). Patient consents verbally to being seen via telemedicine visit today.   States since being on jardiance several months ago has dealt with blisters in groin area that have slowly started resolving after d/c. Also started with dysuria and incontinence while taking. Started about 2 weeks ago with hematuria episodes intermittently, which has happened in the past and has been worked up by Urology several years ago. Diagnosed with radiation cystitis from prostate cancer treatments. Denies stream  interruptions, fevers, N/V.   Also having fever blisters on mouth that come and go. Has not had an issue for years with this but has known hx of HSV. Has been on valtrex in the past.   Relevant past medical, surgical, family and social history reviewed and updated as indicated. Interim medical history since our last visit reviewed. Allergies and medications reviewed and updated.  Review of Systems  Per HPI unless specifically indicated above     Objective:    Ht 5\' 7"  (1.702 m)   Wt 173 lb (78.5 kg)   BMI 27.10 kg/m   Wt Readings from Last 3 Encounters:  05/10/19 173 lb (78.5 kg)  02/14/19 173 lb (78.5 kg)  09/12/18 176 lb (79.8 kg)    Physical Exam  Unable to perform PE due to patient lack of access to video technology for today's visit  Results for orders placed or performed in visit on 05/10/19  Microscopic Examination   URINE  Result Value Ref Range   WBC, UA 11-30 (A) 0 - 5 /hpf   RBC 0-2 0 - 2 /hpf   Epithelial Cells (non renal) 0-10 0 - 10 /hpf   Bacteria, UA None seen None seen/Few  Urine Culture, Reflex   URINE  Result Value Ref Range   Urine Culture, Routine Final report (A)    Organism ID, Bacteria Comment (A)    Antimicrobial Susceptibility Comment   UA/M w/rflx Culture, Routine   Specimen: Urine   URINE  Result Value Ref Range   Specific Gravity, UA 1.020 1.005 - 1.030   pH, UA 6.5 5.0 - 7.5   Color, UA Yellow Yellow   Appearance Ur Clear Clear   Leukocytes,UA 1+ (A) Negative   Protein,UA Trace (A) Negative/Trace   Glucose, UA Negative Negative   Ketones, UA Negative Negative   RBC, UA 1+ (A) Negative   Bilirubin, UA Negative Negative   Urobilinogen, Ur 0.2 0.2 - 1.0 mg/dL   Nitrite, UA Negative Negative   Microscopic Examination See below:    Urinalysis Reflex Comment       Assessment & Plan:   Problem List Items Addressed This Visit      Genitourinary   Chronic radiation cystitis    Await U/A results once sample brought to office to  r/o infection, but suspect the hematuria coming from his cystitis. Will refer back to Urology for monitoring and treat based on U/A results      Relevant Orders   Ambulatory referral to Urology     Other   HSV (herpes simplex virus) infection    Restart valtrex, monitor for benefit      Relevant Medications   valACYclovir (VALTREX) 1000 MG tablet    Other Visit Diagnoses    Dysuria    -  Primary   Relevant Orders   UA/M w/rflx Culture, Routine (Completed)   UA/M w/rflx Culture, Routine       Follow up plan: Return for as scheduled.

## 2019-05-13 LAB — MICROSCOPIC EXAMINATION: Bacteria, UA: NONE SEEN

## 2019-05-13 LAB — UA/M W/RFLX CULTURE, ROUTINE
Bilirubin, UA: NEGATIVE
Glucose, UA: NEGATIVE
Ketones, UA: NEGATIVE
Nitrite, UA: NEGATIVE
Specific Gravity, UA: 1.02 (ref 1.005–1.030)
Urobilinogen, Ur: 0.2 mg/dL (ref 0.2–1.0)
pH, UA: 6.5 (ref 5.0–7.5)

## 2019-05-13 LAB — URINE CULTURE, REFLEX

## 2019-05-14 ENCOUNTER — Other Ambulatory Visit: Payer: Self-pay | Admitting: Family Medicine

## 2019-05-14 MED ORDER — SULFAMETHOXAZOLE-TRIMETHOPRIM 800-160 MG PO TABS: 1.0000 | ORAL_TABLET | Freq: Two times a day (BID) | ORAL | 0 refills | Status: DC

## 2019-05-15 NOTE — Assessment & Plan Note (Signed)
Await U/A results once sample brought to office to r/o infection, but suspect the hematuria coming from his cystitis. Will refer back to Urology for monitoring and treat based on U/A results

## 2019-05-15 NOTE — Assessment & Plan Note (Signed)
Restart valtrex, monitor for benefit

## 2019-05-17 ENCOUNTER — Ambulatory Visit: Payer: Medicare Other | Admitting: Family Medicine

## 2019-05-28 ENCOUNTER — Encounter: Payer: Self-pay | Admitting: Family Medicine

## 2019-05-31 ENCOUNTER — Other Ambulatory Visit: Payer: Self-pay

## 2019-05-31 ENCOUNTER — Other Ambulatory Visit: Payer: Self-pay | Admitting: Family Medicine

## 2019-05-31 DIAGNOSIS — E785 Hyperlipidemia, unspecified: Secondary | ICD-10-CM

## 2019-05-31 MED ORDER — GLIPIZIDE 5 MG PO TABS: 5.0000 mg | ORAL_TABLET | Freq: Two times a day (BID) | ORAL | 2 refills | Status: DC

## 2019-05-31 NOTE — Telephone Encounter (Signed)
Refill request for Fenobibric acid LOV: 05/10/2019 Next Appt: none

## 2019-06-01 MED ORDER — FENOFIBRIC ACID 135 MG PO CPDR: 1.0000 | DELAYED_RELEASE_CAPSULE | Freq: Every day | ORAL | 0 refills | Status: DC

## 2019-06-04 ENCOUNTER — Other Ambulatory Visit: Payer: Self-pay

## 2019-06-04 DIAGNOSIS — I10 Essential (primary) hypertension: Secondary | ICD-10-CM

## 2019-06-04 DIAGNOSIS — E785 Hyperlipidemia, unspecified: Secondary | ICD-10-CM

## 2019-06-04 MED ORDER — CARVEDILOL 25 MG PO TABS: 25.0000 mg | ORAL_TABLET | Freq: Two times a day (BID) | ORAL | 1 refills | Status: DC

## 2019-06-04 MED ORDER — ATORVASTATIN CALCIUM 20 MG PO TABS: 20.0000 mg | ORAL_TABLET | Freq: Every day | ORAL | 1 refills | Status: DC

## 2019-06-04 NOTE — Telephone Encounter (Addendum)
Refill request for Carvedilol, Atorvastatin,  LOV: 05/10/19 Next Appt: none

## 2019-06-10 ENCOUNTER — Encounter: Payer: Self-pay | Admitting: Urology

## 2019-06-13 ENCOUNTER — Ambulatory Visit: Payer: Medicare Other | Admitting: Urology

## 2019-06-15 ENCOUNTER — Other Ambulatory Visit: Payer: Self-pay | Admitting: Family Medicine

## 2019-06-15 ENCOUNTER — Telehealth: Payer: Self-pay | Admitting: Family Medicine

## 2019-06-15 MED ORDER — GABAPENTIN 300 MG PO CAPS: 600.0000 mg | ORAL_CAPSULE | Freq: Two times a day (BID) | ORAL | 1 refills | Status: DC

## 2019-06-15 NOTE — Telephone Encounter (Signed)
Scheduled f/u with rachel 3/3 at 2:30 I see that he is scheduled with you as a telephone on the 3rd just wanted to see what you wanted to do

## 2019-06-15 NOTE — Telephone Encounter (Signed)
Copied from [place] [id number]. Topic: General - Other >> Jun 15, 2019 10:56 AM Keene Breath wrote: Reason for CRM: Patient is returning a call regarding an appt. That he needs.  He got a text message and he is not sure why.  Please advise and call patient to discuss at [phone]

## 2019-06-15 NOTE — Telephone Encounter (Signed)
Routing to provider. Patient's last regular visit was 10/20, upcoming appointment 06/20/19

## 2019-06-15 NOTE — Telephone Encounter (Signed)
Pt called in to follow up on refill request for gabapentin (NEURONTIN) 300 MG capsule . Pt says that pharmacy states that they have previously requested.  Pt says that he has 2 pills left.   Please assist   Pharmacy: [place], [place] - [address]  [place], [place] [address]

## 2019-06-18 NOTE — Telephone Encounter (Signed)
Amber,   Could you please call this patient to see if he is still able to make our phone call tomorrow? If not, please help him reschedule with me in April/early May.   Thanks!  Catie

## 2019-06-19 ENCOUNTER — Telehealth: Payer: Medicare Other

## 2019-06-20 ENCOUNTER — Ambulatory Visit: Payer: Medicare Other | Admitting: Family Medicine

## 2019-06-22 ENCOUNTER — Ambulatory Visit (INDEPENDENT_AMBULATORY_CARE_PROVIDER_SITE_OTHER): Payer: Medicare Other | Admitting: General Practice

## 2019-06-22 ENCOUNTER — Telehealth: Payer: Self-pay | Admitting: Family Medicine

## 2019-06-22 ENCOUNTER — Telehealth: Payer: Medicare Other | Admitting: General Practice

## 2019-06-22 DIAGNOSIS — E1159 Type 2 diabetes mellitus with other circulatory complications: Secondary | ICD-10-CM | POA: Diagnosis not present

## 2019-06-22 DIAGNOSIS — I10 Essential (primary) hypertension: Secondary | ICD-10-CM | POA: Diagnosis not present

## 2019-06-22 DIAGNOSIS — E7800 Pure hypercholesterolemia, unspecified: Secondary | ICD-10-CM

## 2019-06-22 DIAGNOSIS — E1165 Type 2 diabetes mellitus with hyperglycemia: Secondary | ICD-10-CM | POA: Diagnosis not present

## 2019-06-22 DIAGNOSIS — I152 Hypertension secondary to endocrine disorders: Secondary | ICD-10-CM

## 2019-06-22 NOTE — Patient Instructions (Signed)
Visit Information  Goals Addressed            This Visit's Progress   . Diabetes (pt-stated)       Current Barriers:  Marland Kitchen Knowledge Deficits related to basic Diabetes pathophysiology and self care/management . Transportation barriers  Case Manager Clinical Goal(s):  Over the next 120 days, patient will demonstrate improved adherence to prescribed treatment plan for diabetes self care/management as evidenced by:  . daily monitoring and recording of CBG  . adherence to ADA/ carb modified diet . exercise 3 days/week . adherence to prescribed medication regimen  Interventions:  . Provided education to patient about basic DM disease process . Reviewed medications with patient and discussed importance of medication adherence . Discussed plans with patient for ongoing care management follow up and provided patient with direct contact information for care management team . Reviewed scheduled/upcoming provider appointments including: appointment with the pcp on 06/25/2019- transportation barrier - care guide referral for help with transportation needs  . Advised patient, providing education and rationale, to check cbg daily and record, calling pcp for findings outside established parameters.    Patient Self Care Activities:  . Attends all scheduled provider appointments . Last  A1C 7.9- 2020  Please see past updates related to this goal by clicking on the "Past Updates" button in the selected goal      . RNCM: PT-"I am more concerned about my blood pressure" (pt-stated)       CARE PLAN ENTRY (see longtitudinal plan of care for additional care plan information)  Current Barriers:  . Chronic Disease Management support, education, and care coordination needs related to HTN and HLD . Transportation barrier . New onset of Shortness of Breath at times   Clinical Goal(s) related to HTN and HLD:  Over the next 120 days, patient will:  . Work with the care management team to address  educational, disease management, and care coordination needs  . Begin or continue self health monitoring activities as directed today Measure and record blood pressure 5 times per week and adhere to a Heart healthy/ADA diet . Call provider office for new or worsened signs and symptoms Blood pressure findings outside established parameters and Shortness of breath or new signs and symptoms  related to shortness of breath or other chronic health conditions . Call care management team with questions or concerns . Verbalize basic understanding of patient centered plan of care established today  Interventions related to HTN and HLD:  . Evaluation of current treatment plans and patient's adherence to plan as established by provider- pat has an appointment on 3/8 but is unsure if he can come due to his sisters care being broke down. Advised to call the office to get a telephone or virtual visit and also care guide referral for alternate transportation . Assessed patient understanding of disease states- advised the patient range for systolic blood pressure should be 0000000 and diastolic 123XX123. Advised the patient to bring his blood pressure cuff to the office along with his readings to the appointment.  The patient was unable to get a cuff with his insurance but received a cuff from a friend to use. Recent readings given: 2/23 am: 138/62-53, pm: 150/60-50; 2/25 am: 159/77-59, pm: 167/71-50.   . Advised the patient to talk to the pcp about the new onset of shortness of breath at times.  The patient states he is easily getting short of breath when ambulating any distance . Assessed patient's education and care coordination needs- education  on normal ranges of blood pressures and benefits of a heart healthy/ADA diet . Provided disease specific education to patient  . Collaborated with appropriate clinical care team members regarding patient needs  Patient Self Care Activities related to HTN and HLD:  . Patient  is unable to independently self-manage chronic health conditions  Initial goal documentation        Patient verbalizes understanding of instructions provided today.   The care management team will reach out to the patient again over the next 60 days.   Noreene Larsson RN, MSN, [facility] Mobile: [phone]

## 2019-06-22 NOTE — Telephone Encounter (Signed)
   06/22/2019  Name: Duane Hill.   MRN: [id number]   DOB: 1941-04-04   AGE: 79 y.o.   GENDER: male   PCP Duane American, PA-C.   Called pt regarding Duane Hill Referral for transportation. Pt stated his sister who takes him to his doctor appts regulary and to the grocery store has a car out of service. Pt said he can make the trip arrangement through Duane Hill gave him their direct # and transferred the call. Informed pt that they sometimes need a 2 week lead time and if that was the case to please call back and we can re-sched Duane Hill appt for Monday.  Closing referral pending any other needs of patient.  [place] . [email or web address]  [phone]       [place] . [email or web address]  [phone]

## 2019-06-22 NOTE — Chronic Care Management (AMB) (Signed)
Chronic Care Management   Follow Up Note   06/22/2019 Name: Duane Hill. MRN: [id number] DOB: Dec 27, 1940  Referred by: Duane American, PA-C Reason for referral : Chronic Care Management (Follow up: HTN, DM2/HLD and any changes)   Duane Hill. is a 79 y.o. year old male who is a primary care patient of Duane American, PA-C. The CCM team was consulted for assistance with chronic disease management and care coordination needs.    Review of patient status, including review of consultants reports, relevant laboratory and other test results, and collaboration with appropriate care team members and the patient's provider was performed as part of comprehensive patient evaluation and provision of chronic care management services.    SDOH (Social Determinants of Health) assessments performed: Yes See Care Plan activities for detailed interventions related to SDOH)  SDOH Interventions     Most Recent Value  SDOH Interventions  SDOH Interventions for the Following Domains  Transportation, Physical Activity  Physical Activity Interventions  Other (Comments) [the patient states with a lot of activity he gets short of breath, does take care of his animals. Education and support]  Transportation Interventions  Other (Comment) [care guide referral for the patient to have alternate transportation]       Outpatient Encounter Medications as of 06/22/2019  Medication Sig  . acetaminophen (TYLENOL) 500 MG tablet Take 1,000 mg by mouth every 6 (six) hours as needed. Patient uss Caplet extra strength  . atorvastatin (LIPITOR) 20 MG tablet Take 1 tablet (20 mg total) by mouth daily.  . Azelastine HCl 0.15 % SOLN Place 2 sprays into both nostrils 2 (two) times daily. Place 2 sprays into both nostrils 2 (two) times daily.  . carvedilol (COREG) 25 MG tablet Take 1 tablet (25 mg total) by mouth 2 (two) times daily with a meal.  . cetirizine (ZYRTEC) 10 MG tablet Take 1 tablet (10 mg  total) by mouth daily.  . Choline Fenofibrate (FENOFIBRIC ACID) 135 MG CPDR Take 1 capsule by mouth daily.  . fluticasone (FLONASE) 50 MCG/ACT nasal spray Place 1 spray into both nostrils 2 (two) times daily.  Marland Kitchen gabapentin (NEURONTIN) 300 MG capsule Take 2 capsules (600 mg total) by mouth 2 (two) times daily.  Marland Kitchen glipiZIDE (GLUCOTROL) 5 MG tablet Take 1 tablet (5 mg total) by mouth 2 (two) times daily before a meal.  . glucose blood test strip CHECK BLOOD SUGAR TWICE DAILY.  . hydrochlorothiazide (MICROZIDE) 12.5 MG capsule Take 1 capsule (12.5 mg total) by mouth daily.  . metFORMIN (GLUCOPHAGE XR) 500 MG 24 hr tablet Take 1 tablet (500 mg total) by mouth daily with breakfast.  . MICROLET LANCETS MISC USE TWICE DAILY AS DIRECTED.  Marland Kitchen omeprazole (PRILOSEC) 40 MG capsule Take 1 capsule (40 mg total) by mouth daily.  . quinapril (ACCUPRIL) 40 MG tablet Take 1 tablet (40 mg total) by mouth daily.  Marland Kitchen sulfamethoxazole-trimethoprim (BACTRIM DS) 800-160 MG tablet Take 1 tablet by mouth 2 (two) times daily.  . valACYclovir (VALTREX) 1000 MG tablet Take 1 tablet (1,000 mg total) by mouth 2 (two) times daily.  Enid Cutter HFA 108 (90 Base) MCG/ACT inhaler Take 2 puffs every 4-6 hours as necessary for shortness of breath   No facility-administered encounter medications on file as of 06/22/2019.     Objective:   Goals Addressed            This Visit's Progress   . Diabetes (pt-stated)  Current Barriers:  Marland Kitchen Knowledge Deficits related to basic Diabetes pathophysiology and self care/management . Transportation barriers  Case Manager Clinical Goal(s):  Over the next 120 days, patient will demonstrate improved adherence to prescribed treatment plan for diabetes self care/management as evidenced by:  . daily monitoring and recording of CBG  . adherence to ADA/ carb modified diet . exercise 3 days/week . adherence to prescribed medication regimen  Interventions:  . Provided education to patient  about basic DM disease process . Reviewed medications with patient and discussed importance of medication adherence . Discussed plans with patient for ongoing care management follow up and provided patient with direct contact information for care management team . Reviewed scheduled/upcoming provider appointments including: appointment with the pcp on 06/25/2019- transportation barrier - care guide referral for help with transportation needs  . Advised patient, providing education and rationale, to check cbg daily and record, calling pcp for findings outside established parameters.    Patient Self Care Activities:  . Attends all scheduled provider appointments . Last  A1C 7.9- 2020  Please see past updates related to this goal by clicking on the "Past Updates" button in the selected goal      . RNCM: PT-"I am more concerned about my blood pressure" (pt-stated)       CARE PLAN ENTRY (see longtitudinal plan of care for additional care plan information)  Current Barriers:  . Chronic Disease Management support, education, and care coordination needs related to HTN and HLD . Transportation barrier . New onset of Shortness of Breath at times   Clinical Goal(s) related to HTN and HLD:  Over the next 120 days, patient will:  . Work with the care management team to address educational, disease management, and care coordination needs  . Begin or continue self health monitoring activities as directed today Measure and record blood pressure 5 times per week and adhere to a Heart healthy/ADA diet . Call provider office for new or worsened signs and symptoms Blood pressure findings outside established parameters and Shortness of breath or new signs and symptoms  related to shortness of breath or other chronic health conditions . Call care management team with questions or concerns . Verbalize basic understanding of patient centered plan of care established today  Interventions related to HTN and HLD:    . Evaluation of current treatment plans and patient's adherence to plan as established by provider- pat has an appointment on 3/8 but is unsure if he can come due to his sisters care being broke down. Advised to call the office to get a telephone or virtual visit and also care guide referral for alternate transportation . Assessed patient understanding of disease states- advised the patient range for systolic blood pressure should be 0000000 and diastolic 123XX123. Advised the patient to bring his blood pressure cuff to the office along with his readings to the appointment.  The patient was unable to get a cuff with his insurance but received a cuff from a friend to use. Recent readings given: 2/23 am: 138/62-53, pm: 150/60-50; 2/25 am: 159/77-59, pm: 167/71-50.   . Advised the patient to talk to the pcp about the new onset of shortness of breath at times.  The patient states he is easily getting short of breath when ambulating any distance . Assessed patient's education and care coordination needs- education on normal ranges of blood pressures and benefits of a heart healthy/ADA diet . Provided disease specific education to patient  . Collaborated with appropriate clinical care team  members regarding patient needs  Patient Self Care Activities related to HTN and HLD:  . Patient is unable to independently self-manage chronic health conditions  Initial goal documentation         Plan:   The care management team will reach out to the patient again over the next 60 days.    Noreene Larsson RN, MSN, [facility] Mobile: [phone]

## 2019-06-22 NOTE — Telephone Encounter (Signed)
error 

## 2019-06-25 ENCOUNTER — Ambulatory Visit: Payer: Medicare Other | Admitting: Family Medicine

## 2019-06-29 ENCOUNTER — Other Ambulatory Visit: Payer: Self-pay

## 2019-06-29 ENCOUNTER — Encounter: Payer: Self-pay | Admitting: Family Medicine

## 2019-06-29 ENCOUNTER — Ambulatory Visit (INDEPENDENT_AMBULATORY_CARE_PROVIDER_SITE_OTHER): Payer: Medicare Other | Admitting: Family Medicine

## 2019-06-29 VITALS — BP 150/68 | HR 60 | Temp 98.1°F | Ht 68.0 in | Wt 169.0 lb

## 2019-06-29 DIAGNOSIS — E1159 Type 2 diabetes mellitus with other circulatory complications: Secondary | ICD-10-CM | POA: Diagnosis not present

## 2019-06-29 DIAGNOSIS — E7800 Pure hypercholesterolemia, unspecified: Secondary | ICD-10-CM | POA: Diagnosis not present

## 2019-06-29 DIAGNOSIS — I10 Essential (primary) hypertension: Secondary | ICD-10-CM | POA: Diagnosis not present

## 2019-06-29 DIAGNOSIS — E1165 Type 2 diabetes mellitus with hyperglycemia: Secondary | ICD-10-CM

## 2019-06-29 DIAGNOSIS — J3089 Other allergic rhinitis: Secondary | ICD-10-CM | POA: Diagnosis not present

## 2019-06-29 DIAGNOSIS — R0602 Shortness of breath: Secondary | ICD-10-CM | POA: Diagnosis not present

## 2019-06-29 MED ORDER — MONTELUKAST SODIUM 10 MG PO TABS: 10.0000 mg | ORAL_TABLET | Freq: Every day | ORAL | 1 refills | Status: DC

## 2019-06-29 MED ORDER — HYDROCHLOROTHIAZIDE 25 MG PO TABS: 25.0000 mg | ORAL_TABLET | Freq: Every day | ORAL | 0 refills | Status: DC

## 2019-06-29 NOTE — Progress Notes (Signed)
BP (!) 150/68   Pulse 60   Temp 98.1 F (36.7 C) (Oral)   Ht 5\' 8"  (1.727 m)   Wt 169 lb (76.7 kg)   SpO2 96%   BMI 25.70 kg/m    Subjective:    Patient ID: Duane Edelman., male    DOB: 1940/12/30, 79 y.o.   MRN: [id number]  HPI: Duane Hill. is a 79 y.o. male  Chief Complaint  Patient presents with  . Diabetes  . Hypertension   Patient presenting today for chronic condition f/u.   HTN - Home readings running 120s-150s/60s. Taking medications faithfully without side effects. Denies CP, SOB, HAs, dizziness. Trying to work on diet, stays active.   DM - Home BSs running 90s-116 on the glipizide and metformin. Denies low blood sugar spells. Has been working hard on making better diet choices.   Taking lipitor and fenofibrate for HLD, tolerating well. Denies claudication, myalgias.   Flonase doing very well for allergies. Taking mucinex which is helping with some phlegm in throat and some SOB. Albuterol does help.   DOE the past few months, seems to possibly be worsening some. Denies associated CP, dizziness, palpitations, diaphoresis. Feels it may be worse during allergy flares. Smoked cigarettes in the past, quit 32 years ago. Has never been diagnosed with COPD.   Relevant past medical, surgical, family and social history reviewed and updated as indicated. Interim medical history since our last visit reviewed. Allergies and medications reviewed and updated.  Review of Systems  Per HPI unless specifically indicated above     Objective:    BP (!) 150/68   Pulse 60   Temp 98.1 F (36.7 C) (Oral)   Ht 5\' 8"  (1.727 m)   Wt 169 lb (76.7 kg)   SpO2 96%   BMI 25.70 kg/m   Wt Readings from Last 3 Encounters:  06/29/19 169 lb (76.7 kg)  05/10/19 173 lb (78.5 kg)  02/14/19 173 lb (78.5 kg)    Physical Exam Vitals and nursing note reviewed.  Constitutional:      Appearance: Normal appearance.  HENT:     Head: Atraumatic.  Eyes:     Extraocular  Movements: Extraocular movements intact.     Conjunctiva/sclera: Conjunctivae normal.  Cardiovascular:     Rate and Rhythm: Normal rate and regular rhythm.  Pulmonary:     Effort: Pulmonary effort is normal.     Breath sounds: Normal breath sounds.  Musculoskeletal:        General: Normal range of motion.     Cervical back: Normal range of motion and neck supple.  Skin:    General: Skin is warm and dry.  Neurological:     General: No focal deficit present.     Mental Status: He is oriented to person, place, and time.  Psychiatric:        Mood and Affect: Mood normal.        Thought Content: Thought content normal.        Judgment: Judgment normal.     Results for orders placed or performed in visit on 06/29/19  Comprehensive metabolic panel  Result Value Ref Range   Glucose 123 (H) 65 - 99 mg/dL   BUN 18 8 - 27 mg/dL   Creatinine, Ser 1.18 0.76 - 1.27 mg/dL   GFR calc non Af Amer 60 >59 mL/min/1.73   GFR calc Af Amer 69 >59 mL/min/1.73   BUN/Creatinine Ratio 15 10 - 24   Sodium  143 134 - 144 mmol/L   Potassium 4.4 3.5 - 5.2 mmol/L   Chloride 107 (H) 96 - 106 mmol/L   CO2 20 20 - 29 mmol/L   Calcium 9.0 8.6 - 10.2 mg/dL   Total Protein 6.3 6.0 - 8.5 g/dL   Albumin 4.3 3.7 - 4.7 g/dL   Globulin, Total 2.0 1.5 - 4.5 g/dL   Albumin/Globulin Ratio 2.2 1.2 - 2.2   Bilirubin Total 0.3 0.0 - 1.2 mg/dL   Alkaline Phosphatase 48 39 - 117 IU/L   AST 21 0 - 40 IU/L   ALT 19 0 - 44 IU/L  HgB A1c  Result Value Ref Range   Hgb A1c MFr Bld 5.9 (H) 4.8 - 5.6 %   Est. average glucose Bld gHb Est-mCnc 123 mg/dL  Lipid Panel w/o Chol/HDL Ratio  Result Value Ref Range   Cholesterol, Total 127 100 - 199 mg/dL   Triglycerides 230 (H) 0 - 149 mg/dL   HDL 22 (L) >39 mg/dL   VLDL Cholesterol Cal 38 5 - 40 mg/dL   LDL Chol Calc (NIH) 67 0 - 99 mg/dL      Assessment & Plan:   Problem List Items Addressed This Visit      Cardiovascular and Mediastinum   Hypertension associated with  diabetes ([place])    BPs still a bit labile and often above goal, increase HCTZ to 25 mg dose and continue remainder of regimen as is. Continue working on [employer], exercise      Relevant Medications   hydrochlorothiazide (HYDRODIURIL) 25 MG tablet   Other Relevant Orders   Comprehensive metabolic panel (Completed)     Respiratory   Allergic rhinitis    Stable and under good control, continue current regimen        Endocrine   Type 2 diabetes mellitus with hyperglycemia (H[facility]) - Primary    Recheck A1C, adjust as needed. Continue working on lifestyle modifications.       Relevant Orders   HgB A1c (Completed)     Other   Hyperlipidemia    Recheck lipids, adjust as needed. Continue current regimen      Relevant Medications   hydrochlorothiazide (HYDRODIURIL) 25 MG tablet   Other Relevant Orders   Lipid Panel w/o Chol/HDL Ratio (Completed)    Other Visit Diagnoses    SOB (shortness of breath)       EKG today NSR without acute ST or T wave changes. Possibly COPD vs reactive airway, but will continue to monitor closely. Start singulair and monitor for benefi   Relevant Orders   EKG 12-Lead (Completed)       Follow up plan: Return in about 3 months (around 09/29/2019) for SOB, DM, HTN.

## 2019-06-30 LAB — COMPREHENSIVE METABOLIC PANEL
ALT: 19 IU/L (ref 0–44)
AST: 21 IU/L (ref 0–40)
Albumin/Globulin Ratio: 2.2 (ref 1.2–2.2)
Albumin: 4.3 g/dL (ref 3.7–4.7)
Alkaline Phosphatase: 48 IU/L (ref 39–117)
BUN/Creatinine Ratio: 15 (ref 10–24)
BUN: 18 mg/dL (ref 8–27)
Bilirubin Total: 0.3 mg/dL (ref 0.0–1.2)
CO2: 20 mmol/L (ref 20–29)
Calcium: 9 mg/dL (ref 8.6–10.2)
Chloride: 107 mmol/L — ABNORMAL HIGH (ref 96–106)
Creatinine, Ser: 1.18 mg/dL (ref 0.76–1.27)
GFR calc Af Amer: 69 mL/min/{1.73_m2} (ref >59)
GFR calc non Af Amer: 60 mL/min/{1.73_m2} (ref >59)
Globulin, Total: 2 g/dL (ref 1.5–4.5)
Glucose: 123 mg/dL — ABNORMAL HIGH (ref 65–99)
Potassium: 4.4 mmol/L (ref 3.5–5.2)
Sodium: 143 mmol/L (ref 134–144)
Total Protein: 6.3 g/dL (ref 6.0–8.5)

## 2019-06-30 LAB — HEMOGLOBIN A1C
Est. average glucose Bld gHb Est-mCnc: 123 mg/dL
Hgb A1c MFr Bld: 5.9 % — ABNORMAL HIGH (ref 4.8–5.6)

## 2019-06-30 LAB — LIPID PANEL W/O CHOL/HDL RATIO
Cholesterol, Total: 127 mg/dL (ref 100–199)
HDL: 22 mg/dL — ABNORMAL LOW (ref >39)
LDL Chol Calc (NIH): 67 mg/dL (ref 0–99)
Triglycerides: 230 mg/dL — ABNORMAL HIGH (ref 0–149)
VLDL Cholesterol Cal: 38 mg/dL (ref 5–40)

## 2019-07-03 NOTE — Assessment & Plan Note (Signed)
BPs still a bit labile and often above goal, increase HCTZ to 25 mg dose and continue remainder of regimen as is. Continue working on [employer], exercise

## 2019-07-03 NOTE — Assessment & Plan Note (Signed)
Recheck lipids, adjust as needed. Continue current regimen 

## 2019-07-03 NOTE — Assessment & Plan Note (Signed)
Recheck A1C, adjust as needed. Continue working on lifestyle modifications 

## 2019-07-03 NOTE — Assessment & Plan Note (Signed)
Stable and under good control, continue current regimen 

## 2019-07-11 ENCOUNTER — Other Ambulatory Visit: Payer: Self-pay | Admitting: Family Medicine

## 2019-07-11 MED ORDER — OMEPRAZOLE 40 MG PO CPDR: 40.0000 mg | DELAYED_RELEASE_CAPSULE | Freq: Every day | ORAL | 0 refills | Status: DC

## 2019-07-11 NOTE — Telephone Encounter (Signed)
Medication Refill - Medication:  omeprazole (PRILOSEC) 40 MG capsule  Has the patient contacted their pharmacy?  Yes advised to call office. Pt states he's completely out.   Preferred Pharmacy (with phone number or street name):  [facility], [place] Phone:  [phone]  Fax:  [phone]     Agent: Please be advised that RX refills may take up to 3 business days. We ask that you follow-up with your pharmacy.

## 2019-07-27 ENCOUNTER — Ambulatory Visit (INDEPENDENT_AMBULATORY_CARE_PROVIDER_SITE_OTHER): Payer: Medicare Other | Admitting: Pharmacist

## 2019-07-27 DIAGNOSIS — E1165 Type 2 diabetes mellitus with hyperglycemia: Secondary | ICD-10-CM | POA: Diagnosis not present

## 2019-07-27 DIAGNOSIS — E1159 Type 2 diabetes mellitus with other circulatory complications: Secondary | ICD-10-CM

## 2019-07-27 DIAGNOSIS — E7800 Pure hypercholesterolemia, unspecified: Secondary | ICD-10-CM | POA: Diagnosis not present

## 2019-07-27 DIAGNOSIS — I10 Essential (primary) hypertension: Secondary | ICD-10-CM | POA: Diagnosis not present

## 2019-07-27 NOTE — Patient Instructions (Signed)
Visit Information  Goals Addressed            This Visit's Progress     Patient Stated   . PharmD "I want to work on my sugars" (pt-stated)       CARE PLAN ENTRY (see longtitudinal plan of care for additional care plan information)  Current Barriers:  . Diabetes: controlled; most recent A1c 5.9%   . Current eGFR ~60 mL/min . Current antihyperglycemic regimen: metformin XR 500 mg BID, glipizide 5 mg BID o Max tolerated metformin dose d/t GI upset . Denies any episodes of hypoglycemia, denies readings <80 or episodes of dizziness/lightheadedness/sweating . Current blood glucose readings:  o Fasting: has not been checking; remembers that last reading was 116 o Has not checked post prandial readings . Does report that he likes to drink wine. Drinks 1/2 glass before supper, 1/2 with supper, and 1 before bed. Wonders about this in combination with gabapentin, notes that he knows this is for pain, but that he really takes it because it helps him sleep.  . Cardiovascular risk reduction: o Current hypertensive regimen: carvedilol 25 mg BID, HCTZ 12.5 mg daily, quinapril 40 mg daily; reports home BP readings:  SBP DBP HR SBP DBP HR  1-Apr 147 62 65 125 57 59  2-Apr 145 63 57 131 59 59  3-Apr 146 62 56 129 59 60  4-Apr 146 64 58 143 64 58  5-Apr 148 66 55 129 57 58  6-Apr 130 70 56 116 53 60  7-Apr 121 59 60 119 50 71  8-Apr 139 66 58 125 57 67  AVG 140 64 58 127 57 62   o Current hyperlipidemia regimen: atorvastatin 20 mg daily, fenofibrate 135 mg daily; LDL at goal <70 . Allergies/asthma: montelukast 10 mg daily, azelastine BID, fluticasone BID . GERD: omeprazole 40 mg QPM  Pharmacist Clinical Goal(s):  . Over the next 90 days, patient with work with PharmD and primary care provider to address optimized glycemic management  Interventions: . Comprehensive medication review performed, medication list updated in electronic medical record . Congratulated on attainment of goal  A1c. . Patient has had an old supply of metformin that has been supplying him taking 500 mg BID, even though script is for 500 mg daily. Denies GI upset w/ metformin 500 mg BID. Will collaborate w/ PCP to send prescription in for 500 mg BID . Discussed risk of hypoglycemia with glipizide, especially given his practice of drinking 2 glasses of wine nightly w/ gabapentin. Strongly encouraged patient to check BG occasionally before bed to ensure glipizide isn't causing hypoglycemia. Low threshold for recommendation of decreasing PM glipizide dose or eliminating, pending readings. . Reviewed BP readings. Appear to be much improved. Will collaborate w/ PCP to see if she would still like to see patient in office in the next few weeks for HTN f/u, or if deferring f/u until scheduled f/u in June is sufficient.   Patient Self Care Activities:  . Patient will check blood glucose daily, document, and provide at future appointments . Patient will take medications as prescribed . Patient will report any questions or concerns to provider   Please see past updates related to this goal by clicking on the "Past Updates" button in the selected goal         Patient verbalizes understanding of instructions provided today.   Plan:  - Scheduled f/u 11/06/19  Catie , PharmD, BCACP Clinical Pharmacist [facility] [phone]  

## 2019-07-27 NOTE — Chronic Care Management (AMB) (Signed)
Chronic Care Management   Follow Up Note   07/27/2019 Name: Duane Hill. MRN: [id number] DOB: 1941/03/12  Referred by: [name], PA-C Reason for referral : Chronic Care Management (Medication Management)   Duane Hill. is a 79 y.o. year old male who is a primary care patient of [name], [place]. The CCM team was consulted for assistance with chronic disease management and care coordination needs.    Contacted patient for medication management review.  Review of patient status, including review of consultants reports, relevant laboratory and other test results, and collaboration with appropriate care team members and the patient's provider was performed as part of comprehensive patient evaluation and provision of chronic care management services.    SDOH (Social Determinants of Health) assessments performed: Yes See Care Plan activities for detailed interventions related to [facility] [place])     Outpatient Encounter Medications as of 07/27/2019  Medication Sig Note  . atorvastatin (LIPITOR) 20 MG tablet Take 1 tablet (20 mg total) by mouth daily.   . Azelastine HCl 0.15 % SOLN Place 2 sprays into both nostrils 2 (two) times daily. Place 2 sprays into both nostrils 2 (two) times daily.   . carvedilol (COREG) 25 MG tablet Take 1 tablet (25 mg total) by mouth 2 (two) times daily with a meal.   . Choline Fenofibrate (FENOFIBRIC ACID) 135 MG CPDR Take 1 capsule by mouth daily.   . fluticasone (FLONASE) 50 MCG/ACT nasal spray Place 1 spray into both nostrils 2 (two) times daily.   Marland Kitchen gabapentin (NEURONTIN) 300 MG capsule Take 2 capsules (600 mg total) by mouth 2 (two) times daily.   Marland Kitchen glipiZIDE (GLUCOTROL) 5 MG tablet Take 1 tablet (5 mg total) by mouth 2 (two) times daily before a meal.   . glucose blood test strip CHECK BLOOD SUGAR TWICE DAILY.   . hydrochlorothiazide (HYDRODIURIL) 25 MG tablet Take 1 tablet (25 mg total) by mouth daily.   . metFORMIN (GLUCOPHAGE  XR) 500 MG 24 hr tablet Take 1 tablet (500 mg total) by mouth daily with breakfast. 07/27/2019: Taking BID  . MICROLET LANCETS MISC USE TWICE DAILY AS DIRECTED.   Marland Kitchen montelukast (SINGULAIR) 10 MG tablet Take 1 tablet (10 mg total) by mouth at bedtime.   Marland Kitchen omeprazole (PRILOSEC) 40 MG capsule Take 1 capsule (40 mg total) by mouth daily.   . quinapril (ACCUPRIL) 40 MG tablet Take 1 tablet (40 mg total) by mouth daily.   . VENTOLIN HFA 108 (90 Base) MCG/ACT inhaler Take 2 puffs every 4-6 hours as necessary for shortness of breath   . acetaminophen (TYLENOL) 500 MG tablet Take 1,000 mg by mouth every 6 (six) hours as needed. Patient uss Caplet extra strength   . valACYclovir (VALTREX) 1000 MG tablet Take 1 tablet (1,000 mg total) by mouth 2 (two) times daily. (Patient not taking: Reported on 06/29/2019)   . [DISCONTINUED] cetirizine (ZYRTEC) 10 MG tablet Take 1 tablet (10 mg total) by mouth daily.    No facility-administered encounter medications on file as of 07/27/2019.     Objective:   Goals Addressed            This Visit's Progress     Patient Stated   . PharmD "I want to work on my sugars" (pt-stated)       [place] (see longtitudinal plan of care for additional care plan information)  Current Barriers:  . Diabetes: controlled; most recent A1c 5.9%   . Current  eGFR ~60 mL/min . Current antihyperglycemic regimen: metformin XR 500 mg BID, glipizide 5 mg BID o Max tolerated metformin dose d/t GI upset . Denies any episodes of hypoglycemia, denies readings <80 or episodes of dizziness/lightheadedness/sweating . Current blood glucose readings:  o Fasting: has not been checking; remembers that last reading was 116 o Has not checked post prandial readings . Does report that he likes to drink wine. Drinks 1/2 glass before supper, 1/2 with supper, and 1 before bed. Wonders about this in combination with gabapentin, notes that he knows this is for pain, but that he really takes it because  it helps him sleep.  . Cardiovascular risk reduction: o Current hypertensive regimen: carvedilol 25 mg BID, HCTZ 12.5 mg daily, quinapril 40 mg daily; reports home BP readings:  SBP DBP HR SBP DBP HR  1-Apr 147 62 65 125 57 59  2-Apr 145 63 57 131 59 59  3-Apr 146 62 56 129 59 60  4-Apr 146 64 58 143 64 58  5-Apr 148 66 55 129 57 58  6-Apr 130 70 56 116 53 60  7-Apr 121 59 60 119 50 71  8-Apr 139 66 58 125 57 67  AVG 140 64 58 127 57 62   o Current hyperlipidemia regimen: atorvastatin 20 mg daily, fenofibrate 135 mg daily; LDL at goal <70 . Allergies/asthma: montelukast 10 mg daily, azelastine BID, fluticasone BID . GERD: omeprazole 40 mg QPM  Pharmacist Clinical Goal(s):  Marland Kitchen Over the next 90 days, patient with work with PharmD and primary care provider to address optimized glycemic management  Interventions: . Comprehensive medication review performed, medication list updated in electronic medical record . Congratulated on attainment of goal A1c. . Patient has had an old supply of metformin that has been supplying him taking 500 mg BID, even though script is for 500 mg daily. Denies GI upset w/ metformin 500 mg BID. Will collaborate w/ PCP to send prescription in for 500 mg BID . Discussed risk of hypoglycemia with glipizide, especially given his practice of drinking 2 glasses of wine nightly w/ gabapentin. Strongly encouraged patient to check BG occasionally before bed to ensure glipizide isn't causing hypoglycemia. Low threshold for recommendation of decreasing PM glipizide dose or eliminating, pending readings. . Reviewed BP readings. Appear to be much improved. Will collaborate w/ PCP to see if she would still like to see patient in office in the next few weeks for HTN f/u, or if deferring f/u until scheduled f/u in June is sufficient.   Patient Self Care Activities:  . Patient will check blood glucose daily, document, and provide at future appointments . Patient will take  medications as prescribed . Patient will report any questions or concerns to provider   Please see past updates related to this goal by clicking on the "Past Updates" button in the selected goal          Plan:  - Scheduled f/u 11/06/19  Catie Darnelle Maffucci, PharmD, [place] [phone]

## 2019-07-31 ENCOUNTER — Other Ambulatory Visit: Payer: Self-pay | Admitting: Nurse Practitioner

## 2019-07-31 DIAGNOSIS — E785 Hyperlipidemia, unspecified: Secondary | ICD-10-CM

## 2019-07-31 NOTE — Telephone Encounter (Signed)
Requested Prescriptions  Pending Prescriptions Disp Refills  . Choline Fenofibrate (FENOFIBRIC ACID) 135 MG CPDR [Pharmacy Med Name: FENOFIBRIC ACID DR 135 MG CAP] 62 capsule 0    Sig: Take 1 capsule by mouth daily.     Cardiovascular:  Antilipid - Fibric Acid Derivatives Failed - 07/31/2019  2:11 PM      Failed - HDL in normal range and within 360 days    HDL  Date Value Ref Range Status  06/29/2019 22 (L) >39 mg/dL Final         Failed - Triglycerides in normal range and within 360 days    Triglycerides  Date Value Ref Range Status  06/29/2019 230 (H) 0 - 149 mg/dL Final   Triglycerides Piccolo,Waived  Date Value Ref Range Status  05/25/2016 280 (H) <150 mg/dL Final    Comment:                            Normal                   <150                         Borderline High     150 - 199                         High                200 - 499                         Very High                >499          Passed - Total Cholesterol in normal range and within 360 days    Cholesterol, Total  Date Value Ref Range Status  06/29/2019 127 100 - 199 mg/dL Final   Cholesterol Piccolo, Waived  Date Value Ref Range Status  05/25/2016 141 <200 mg/dL Final    Comment:                            Desirable                <200                         Borderline High      200- 239                         High                     >239          Passed - LDL in normal range and within 360 days    LDL Chol Calc (NIH)  Date Value Ref Range Status  06/29/2019 67 0 - 99 mg/dL Final         Passed - ALT in normal range and within 180 days    ALT  Date Value Ref Range Status  06/29/2019 19 0 - 44 IU/L Final   ALT (SGPT) Piccolo, Waived  Date Value Ref Range Status  05/25/2016 31 10 - 47 U/L Final         Passed - AST  in normal range and within 180 days    AST  Date Value Ref Range Status  06/29/2019 21 0 - 40 IU/L Final   AST (SGOT) Piccolo, Waived  Date Value Ref Range Status   05/25/2016 27 11 - 38 U/L Final         Passed - Cr in normal range and within 180 days    Creatinine  Date Value Ref Range Status  08/16/2014 1.11 mg/dL Final    Comment:    0.61-1.24 NOTE: New Reference Range  06/25/14    Creatinine, Ser  Date Value Ref Range Status  06/29/2019 1.18 0.76 - 1.27 mg/dL Final         Passed - eGFR in normal range and within 180 days    EGFR (African American)  Date Value Ref Range Status  08/16/2014 >60  Final   GFR calc Af Amer  Date Value Ref Range Status  06/29/2019 69 >59 mL/min/1.73 Final   EGFR (Non-African Amer.)  Date Value Ref Range Status  08/16/2014 >60  Final    Comment:    eGFR values <60m/min/1.73 m2 may be an indication of chronic kidney disease (CKD). Calculated eGFR is useful in patients with stable renal function. The eGFR calculation will not be reliable in acutely ill patients when serum creatinine is changing rapidly. It is not useful in patients on dialysis. The eGFR calculation may not be applicable to patients at the low and high extremes of body sizes, pregnant women, and vegetarians.    GFR calc non Af Amer  Date Value Ref Range Status  06/29/2019 60 >59 mL/min/1.73 Final         Passed - Valid encounter within last 12 months    Recent Outpatient Visits          1 month ago Type 2 diabetes mellitus with hyperglycemia, without long-term current use of insulin ([facility]   [facility] [place]  2 months ago [facility] [place] [facility] [place]  5 months ago Type 2 diabetes mellitus without complication, without long-term current use of insulin ([facility]   [facility] [place]  10 months ago Essential hypertension   CP[place], MJeannette How MD   1 year ago Essential hypertension   [place]      Future Appointments            In 2 months LOrene Desanctis RLilia Argue [facility]-C [facility] [place]          Patient need to keep appointment for future refills

## 2019-08-01 ENCOUNTER — Other Ambulatory Visit: Payer: Self-pay | Admitting: Family Medicine

## 2019-08-01 MED ORDER — METFORMIN HCL ER 500 MG PO TB24: 500.0000 mg | ORAL_TABLET | Freq: Every day | ORAL | 0 refills | Status: DC

## 2019-08-01 MED ORDER — METFORMIN HCL ER 500 MG PO TB24: 500.0000 mg | ORAL_TABLET | Freq: Two times a day (BID) | ORAL | 0 refills | Status: DC

## 2019-08-22 ENCOUNTER — Telehealth: Payer: Self-pay

## 2019-08-22 ENCOUNTER — Ambulatory Visit: Payer: Self-pay | Admitting: General Practice

## 2019-08-22 NOTE — Chronic Care Management (AMB) (Signed)
  Chronic Care Management   Outreach Note  08/22/2019 Name: Duane Hill. MRN: [id number] DOB: 15-Oct-1940  Referred by: [name], PA-C Reason for referral : Chronic Care Management (Follow up on DM/HLD/HTN and other needs)   An unsuccessful telephone outreach was attempted today. The patient was referred to the case management team for assistance with care management and care coordination.   Follow Up Plan: A HIPPA compliant phone message was left for the patient providing contact information and requesting a return call.   Noreene Larsson RN, MSN, [facility] Mobile: [phone]

## 2019-08-31 ENCOUNTER — Other Ambulatory Visit: Payer: Self-pay | Admitting: Family Medicine

## 2019-08-31 DIAGNOSIS — I10 Essential (primary) hypertension: Secondary | ICD-10-CM

## 2019-09-04 ENCOUNTER — Telehealth: Payer: Self-pay | Admitting: Family Medicine

## 2019-09-04 NOTE — Telephone Encounter (Signed)
From: Burnett Harry [email or web address]  Sent: Tuesday, Sep 04, 2019 11:15 AM To: Jill Alexanders [facility]) [email or web address]>; Morris Clydene Laming, [place] [email or web address]> Cc: Burnett Harry [email or web address]> Subject: RE: C3 Application Marry Guan  We sent a check around 02/23/19 in the amount of $500.00 to [facility] for [employer].  Our records are showing the check has not been cashed.  When I called [facility] they said they couldn't discuss the account with me.  Is there anyway you could ask Mr. Copus to call and ask if they can cash the check or if they even received it?  The Kathrine Haddock number is [phone]  Appreciate you help with this.  [place] [employer] [facility]  Direct Dial: [phone]  Fax: [phone]  Website: [email or web address]  The purpose of the [facility] is to seek ways to further the Hospital's mission to improve the health of the citizens of [facility] and its surrounding communities.

## 2019-09-04 NOTE — Telephone Encounter (Signed)
Called pt and tried to 3 way call with [place] extensive wait time and dialed my c/b # to have them call me back will conf in patient upon return call.  Pt stated that another company is working with him on his loan called [employer] he gave me their [phone] will wait to hear back from [employer].

## 2019-09-11 ENCOUNTER — Other Ambulatory Visit: Payer: Self-pay | Admitting: Family Medicine

## 2019-09-24 ENCOUNTER — Other Ambulatory Visit: Payer: Self-pay | Admitting: Family Medicine

## 2019-09-24 ENCOUNTER — Other Ambulatory Visit: Payer: Self-pay | Admitting: Nurse Practitioner

## 2019-09-24 DIAGNOSIS — E785 Hyperlipidemia, unspecified: Secondary | ICD-10-CM

## 2019-09-26 ENCOUNTER — Other Ambulatory Visit: Payer: Self-pay | Admitting: Family Medicine

## 2019-09-26 NOTE — Telephone Encounter (Signed)
Followed up with Mr. Duane Hill he stated that he never heard back from [facility] or [facility] care he stated that he will call them again. knb

## 2019-09-27 NOTE — Telephone Encounter (Signed)
Spoke with pt he said he will look up # on his statement. Pt said he will try them back and see if he can get someone on the phone.

## 2019-10-01 ENCOUNTER — Other Ambulatory Visit: Payer: Self-pay

## 2019-10-01 ENCOUNTER — Telehealth: Payer: Self-pay | Admitting: Family Medicine

## 2019-10-01 ENCOUNTER — Encounter: Payer: Self-pay | Admitting: Family Medicine

## 2019-10-01 ENCOUNTER — Ambulatory Visit (INDEPENDENT_AMBULATORY_CARE_PROVIDER_SITE_OTHER): Payer: Medicare Other | Admitting: Family Medicine

## 2019-10-01 VITALS — BP 134/62 | HR 58 | Temp 98.5°F | Wt 173.0 lb

## 2019-10-01 DIAGNOSIS — E785 Hyperlipidemia, unspecified: Secondary | ICD-10-CM

## 2019-10-01 DIAGNOSIS — I10 Essential (primary) hypertension: Secondary | ICD-10-CM | POA: Diagnosis not present

## 2019-10-01 DIAGNOSIS — Z1159 Encounter for screening for other viral diseases: Secondary | ICD-10-CM

## 2019-10-01 DIAGNOSIS — E1165 Type 2 diabetes mellitus with hyperglycemia: Secondary | ICD-10-CM | POA: Diagnosis not present

## 2019-10-01 LAB — BAYER DCA HB A1C WAIVED: HB A1C (BAYER DCA - WAIVED): 5.8 % (ref <7.0)

## 2019-10-01 MED ORDER — GABAPENTIN 300 MG PO CAPS: 600.0000 mg | ORAL_CAPSULE | Freq: Two times a day (BID) | ORAL | 1 refills | Status: DC

## 2019-10-01 MED ORDER — HYDROCHLOROTHIAZIDE 25 MG PO TABS: 25.0000 mg | ORAL_TABLET | Freq: Every day | ORAL | 1 refills | Status: DC

## 2019-10-01 MED ORDER — OMEPRAZOLE 40 MG PO CPDR: 40.0000 mg | DELAYED_RELEASE_CAPSULE | Freq: Every day | ORAL | 1 refills | Status: DC

## 2019-10-01 MED ORDER — AZELASTINE HCL 0.15 % NA SOLN: 2.0000 | Freq: Two times a day (BID) | NASAL | 3 refills | Status: DC

## 2019-10-01 MED ORDER — FLUTICASONE PROPIONATE 50 MCG/ACT NA SUSP: 1.0000 | Freq: Two times a day (BID) | NASAL | 6 refills | Status: DC

## 2019-10-01 MED ORDER — FENOFIBRIC ACID 135 MG PO CPDR: 1.0000 | DELAYED_RELEASE_CAPSULE | Freq: Every day | ORAL | 1 refills | Status: DC

## 2019-10-01 MED ORDER — METFORMIN HCL ER 500 MG PO TB24: 500.0000 mg | ORAL_TABLET | Freq: Two times a day (BID) | ORAL | 1 refills | Status: DC

## 2019-10-01 MED ORDER — ALBUTEROL SULFATE HFA 108 (90 BASE) MCG/ACT IN AERS: 1.0000 | INHALATION_SPRAY | Freq: Four times a day (QID) | RESPIRATORY_TRACT | 6 refills | Status: DC | PRN

## 2019-10-01 MED ORDER — ATORVASTATIN CALCIUM 20 MG PO TABS: 20.0000 mg | ORAL_TABLET | Freq: Every day | ORAL | 1 refills | Status: DC

## 2019-10-01 MED ORDER — QUINAPRIL HCL 40 MG PO TABS: 40.0000 mg | ORAL_TABLET | Freq: Every day | ORAL | 1 refills | Status: DC

## 2019-10-01 MED ORDER — CARVEDILOL 25 MG PO TABS: 25.0000 mg | ORAL_TABLET | Freq: Two times a day (BID) | ORAL | 1 refills | Status: DC

## 2019-10-01 NOTE — Telephone Encounter (Signed)
erroneous error  

## 2019-10-01 NOTE — Assessment & Plan Note (Signed)
Sugars doing great with A1c of 5.8. We will stop glipizide and recheck 6 months. Call with any concerns. Refills given.

## 2019-10-01 NOTE — Progress Notes (Signed)
BP 134/62 (BP Location: Left Arm, Cuff Size: Normal)   Pulse (!) 58   Temp 98.5 F (36.9 C) (Oral)   Wt 173 lb (78.5 kg)   SpO2 96%   BMI 26.30 kg/m    Subjective:    Patient ID: Duane Edelman., male    DOB: 1940/12/27, 79 y.o.   MRN: [id number]  HPI: Duane Virgil. is a 79 y.o. male  Chief Complaint  Patient presents with  . Diabetes   DIABETES Hypoglycemic episodes:no Polydipsia/polyuria: no Visual disturbance: no Chest pain: no Paresthesias: no Glucose Monitoring: yes  Accucheck frequency: Not Checking Taking Insulin?: yes Blood Pressure Monitoring: not checking Retinal Examination: Up to Date Foot Exam: Up to Date Diabetic Education: Completed Pneumovax: Up to Date Influenza: Up to Date Aspirin: yes  Relevant past medical, surgical, family and social history reviewed and updated as indicated. Interim medical history since our last visit reviewed. Allergies and medications reviewed and updated.  Review of Systems  Per HPI unless specifically indicated above     Objective:    BP 134/62 (BP Location: Left Arm, Cuff Size: Normal)   Pulse (!) 58   Temp 98.5 F (36.9 C) (Oral)   Wt 173 lb (78.5 kg)   SpO2 96%   BMI 26.30 kg/m   Wt Readings from Last 3 Encounters:  10/01/19 173 lb (78.5 kg)  06/29/19 169 lb (76.7 kg)  05/10/19 173 lb (78.5 kg)    Physical Exam  Results for orders placed or performed in visit on 06/29/19  Comprehensive metabolic panel  Result Value Ref Range   Glucose 123 (H) 65 - 99 mg/dL   BUN 18 8 - 27 mg/dL   Creatinine, Ser 1.18 0.76 - 1.27 mg/dL   GFR calc non Af Amer 60 >59 mL/min/1.73   GFR calc Af Amer 69 >59 mL/min/1.73   BUN/Creatinine Ratio 15 10 - 24   Sodium 143 134 - 144 mmol/L   Potassium 4.4 3.5 - 5.2 mmol/L   Chloride 107 (H) 96 - 106 mmol/L   CO2 20 20 - 29 mmol/L   Calcium 9.0 8.6 - 10.2 mg/dL   Total Protein 6.3 6.0 - 8.5 g/dL   Albumin 4.3 3.7 - 4.7 g/dL   Globulin, Total 2.0 1.5 - 4.5 g/dL    Albumin/Globulin Ratio 2.2 1.2 - 2.2   Bilirubin Total 0.3 0.0 - 1.2 mg/dL   Alkaline Phosphatase 48 39 - 117 IU/L   AST 21 0 - 40 IU/L   ALT 19 0 - 44 IU/L  HgB A1c  Result Value Ref Range   Hgb A1c MFr Bld 5.9 (H) 4.8 - 5.6 %   Est. average glucose Bld gHb Est-mCnc 123 mg/dL  Lipid Panel w/o Chol/HDL Ratio  Result Value Ref Range   Cholesterol, Total 127 100 - 199 mg/dL   Triglycerides 230 (H) 0 - 149 mg/dL   HDL 22 (L) >39 mg/dL   VLDL Cholesterol Cal 38 5 - 40 mg/dL   LDL Chol Calc (NIH) 67 0 - 99 mg/dL      Assessment & Plan:   Problem List Items Addressed This Visit      Endocrine   Type 2 diabetes mellitus with hyperglycemia (HCC) - Primary    Sugars doing great with A1c of 5.8. We will stop glipizide and recheck 6 months. Call with any concerns. Refills given.       Relevant Medications   metFORMIN (GLUCOPHAGE XR) 500 MG 24 hr tablet  quinapril (ACCUPRIL) 40 MG tablet   atorvastatin (LIPITOR) 20 MG tablet   Other Relevant Orders   Bayer DCA Hb A1c Waived     Other   Hyperlipidemia   Relevant Medications   quinapril (ACCUPRIL) 40 MG tablet   hydrochlorothiazide (HYDRODIURIL) 25 MG tablet   carvedilol (COREG) 25 MG tablet   atorvastatin (LIPITOR) 20 MG tablet   Choline Fenofibrate (FENOFIBRIC ACID) 135 MG CPDR    Other Visit Diagnoses    Need for hepatitis C screening test       Relevant Orders   Hepatitis C antibody   Essential hypertension       Relevant Medications   quinapril (ACCUPRIL) 40 MG tablet   hydrochlorothiazide (HYDRODIURIL) 25 MG tablet   carvedilol (COREG) 25 MG tablet   atorvastatin (LIPITOR) 20 MG tablet   Choline Fenofibrate (FENOFIBRIC ACID) 135 MG CPDR       Follow up plan: Return in about 6 months (around 04/01/2020).

## 2019-10-02 LAB — HEPATITIS C ANTIBODY: Hep C Virus Ab: <0.1 s/co ratio (ref 0.0–0.9)

## 2019-10-05 ENCOUNTER — Telehealth: Payer: Medicare Other | Admitting: General Practice

## 2019-10-05 ENCOUNTER — Ambulatory Visit (INDEPENDENT_AMBULATORY_CARE_PROVIDER_SITE_OTHER): Payer: Medicare Other | Admitting: General Practice

## 2019-10-05 DIAGNOSIS — E785 Hyperlipidemia, unspecified: Secondary | ICD-10-CM

## 2019-10-05 DIAGNOSIS — E1165 Type 2 diabetes mellitus with hyperglycemia: Secondary | ICD-10-CM

## 2019-10-05 DIAGNOSIS — R0602 Shortness of breath: Secondary | ICD-10-CM

## 2019-10-05 DIAGNOSIS — I10 Essential (primary) hypertension: Secondary | ICD-10-CM

## 2019-10-05 NOTE — Patient Instructions (Signed)
Visit Information  Goals Addressed              This Visit's Progress     RNCM: PT-"I am more concerned about my blood pressure" (pt-stated)        CARE PLAN ENTRY (see longtitudinal plan of care for additional care plan information)  Current Barriers:   Chronic Disease Management support, education, and care coordination needs related to HTN and HLD  Transportation barrier  New onset of Shortness of Breath at times   Clinical Goal(s) related to HTN and HLD:  Over the next 120 days, patient will:   Work with the care management team to address educational, disease management, and care coordination needs   Begin or continue self health monitoring activities as directed today Measure and record blood pressure 5 times per week and adhere to a Heart healthy/ADA diet  Call provider office for new or worsened signs and symptoms Blood pressure findings outside established parameters and Shortness of breath or new signs and symptoms  related to shortness of breath or other chronic health conditions  Call care management team with questions or concerns  Verbalize basic understanding of patient centered plan of care established today  Interventions related to HTN and HLD:   Evaluation of current treatment plans and patient's adherence to plan as established by provider- pat has an appointment on 3/8 but is unsure if he can come due to his sisters car being broke down. Advised to call the office to get a telephone or virtual visit and also care guide referral for alternate transportation.  10-05-2019: The patient had an appointment on 10-01-2019 and saw pcp. Has had changes in medications and knows how to take as directed.   Assessed patient understanding of disease states- advised the patient range for systolic blood pressure should be 983-382 and diastolic 50-53. Advised the patient to bring his blood pressure cuff to the office along with his readings to the appointment.  The patient was  unable to get a cuff with his insurance but received a cuff from a friend to use. Recent readings given: 2/23 am: 138/62-53, pm: 150/60-50; 2/25 am: 159/77-59, pm: 167/71-50.  The patient had good readings at the office on Monday.   Advised the patient to talk to the pcp about the new onset of shortness of breath at times.  The patient states he is easily getting short of breath when ambulating any distance. 10-05-2019:  The patient is pacing his activity. The patient is limited in his ability to do activity due to shortness of breath.   Assessed patient's education and care coordination needs- education on normal ranges of blood pressures and benefits of a heart healthy/ADA diet.  10-05-2019: The patient endorses heart healthy/ADA diet.    Provided disease specific education to patient: The patient verbalized that the pcp discontinued one of his medications on Monday. His hemoglobin A1C was 5.8.  The patient checks his blood sugar every now and then. Monday it was 115.  The patient wanted to know what his blood sugars need to be after eating. Education on fasting blood sugar <130 and post prandial <180.  The patient verbalized understanding.   Collaborated with appropriate clinical care team members regarding patient needs  Patient Self Care Activities related to HTN and HLD:   Patient is unable to independently self-manage chronic health conditions  Please see past updates related to this goal by clicking on the "Past Updates" button in the selected goal  Patient verbalizes understanding of instructions provided today.   Telephone follow up appointment with care management team member scheduled for: 11-30-2019 at 2pm  [place], MSN, [facility] Mobile: [phone]

## 2019-10-05 NOTE — Chronic Care Management (AMB) (Signed)
Chronic Care Management   Follow Up Note   10/05/2019 Name: Duane Hill. MRN: [id number] DOB: 11-23-1940  Referred by: [name], PA-C Reason for referral : Chronic Care Management (RNCM Chronic Case Management and Care Coordination Needs)   Duane Tisdel. is a 79 y.o. year old male who is a primary care patient of [name], [place]. The CCM team was consulted for assistance with chronic disease management and care coordination needs.    Review of patient status, including review of consultants reports, relevant laboratory and other test results, and collaboration with appropriate care team members and the patient's provider was performed as part of comprehensive patient evaluation and provision of chronic care management services.    SDOH (Social Determinants of Health) assessments performed: Yes- transportation. The patient has adequate transportation at this time.  See Care Plan activities for detailed interventions related to [facility]     Outpatient Encounter Medications as of 10/05/2019  Medication Sig  . acetaminophen (TYLENOL) 500 MG tablet Take 1,000 mg by mouth every 6 (six) hours as needed. Patient uss Caplet extra strength  . albuterol (VENTOLIN HFA) 108 (90 Base) MCG/ACT inhaler Inhale 1-2 puffs into the lungs every 6 (six) hours as needed for wheezing or shortness of breath.  Marland Kitchen atorvastatin (LIPITOR) 20 MG tablet Take 1 tablet (20 mg total) by mouth daily.  . Azelastine HCl 0.15 % SOLN Place 2 sprays into both nostrils 2 (two) times daily. Place 2 sprays into both nostrils 2 (two) times daily.  . carvedilol (COREG) 25 MG tablet Take 1 tablet (25 mg total) by mouth 2 (two) times daily with a meal.  . Choline Fenofibrate (FENOFIBRIC ACID) 135 MG CPDR Take 1 capsule by mouth daily.  . fluticasone (FLONASE) 50 MCG/ACT nasal spray Place 1 spray into both nostrils 2 (two) times daily.  Marland Kitchen gabapentin (NEURONTIN) 300 MG capsule Take 2 capsules (600 mg  total) by mouth 2 (two) times daily.  Marland Kitchen glucose blood test strip CHECK BLOOD SUGAR TWICE DAILY.  . hydrochlorothiazide (HYDRODIURIL) 25 MG tablet Take 1 tablet (25 mg total) by mouth daily.  . metFORMIN (GLUCOPHAGE XR) 500 MG 24 hr tablet Take 1 tablet (500 mg total) by mouth in the morning and at bedtime.  Marland Kitchen MICROLET LANCETS MISC USE TWICE DAILY AS DIRECTED.  Marland Kitchen montelukast (SINGULAIR) 10 MG tablet Take 1 tablet (10 mg total) by mouth at bedtime. (Patient not taking: Reported on 10/01/2019)  . omeprazole (PRILOSEC) 40 MG capsule Take 1 capsule (40 mg total) by mouth daily.  . quinapril (ACCUPRIL) 40 MG tablet Take 1 tablet (40 mg total) by mouth daily.   No facility-administered encounter medications on file as of 10/05/2019.     Objective:  BP Readings from Last 3 Encounters:  10/01/19 134/62  06/29/19 (!) 150/68  06/14/19 (!) 167/71   Lab Results  Component Value Date   HGBA1C 5.8 10/01/2019    Goals Addressed              This Visit's Progress   .  RNCM: PT-"I am more concerned about my blood pressure" (pt-stated)        CARE PLAN ENTRY (see longtitudinal plan of care for additional care plan information)  Current Barriers:  . Chronic Disease Management support, education, and care coordination needs related to HTN and HLD . Transportation barrier . New onset of Shortness of Breath at times   Clinical Goal(s) related to HTN and HLD:  Over the next  120 days, patient will:  . Work with the care management team to address educational, disease management, and care coordination needs  . Begin or continue self health monitoring activities as directed today Measure and record blood pressure 5 times per week and adhere to a Heart healthy/ADA diet . Call provider office for new or worsened signs and symptoms Blood pressure findings outside established parameters and Shortness of breath or new signs and symptoms  related to shortness of breath or other chronic health  conditions . Call care management team with questions or concerns . Verbalize basic understanding of patient centered plan of care established today  Interventions related to HTN and HLD:  . Evaluation of current treatment plans and patient's adherence to plan as established by provider- pat has an appointment on 3/8 but is unsure if he can come due to his sisters car being broke down. Advised to call the office to get a telephone or virtual visit and also care guide referral for alternate transportation.  10-05-2019: The patient had an appointment on 10-01-2019 and saw pcp. Has had changes in medications and knows how to take as directed.  . Assessed patient understanding of disease states- advised the patient range for systolic blood pressure should be 338-250 and diastolic 53-97. Advised the patient to bring his blood pressure cuff to the office along with his readings to the appointment.  The patient was unable to get a cuff with his insurance but received a cuff from a friend to use. Recent readings given: 2/23 am: 138/62-53, pm: 150/60-50; 2/25 am: 159/77-59, pm: 167/71-50.  The patient had good readings at the office on Monday.  . Advised the patient to talk to the pcp about the new onset of shortness of breath at times.  The patient states he is easily getting short of breath when ambulating any distance. 10-05-2019:  The patient is pacing his activity. The patient is limited in his ability to do activity due to shortness of breath.  . Assessed patient's education and care coordination needs- education on normal ranges of blood pressures and benefits of a heart healthy/ADA diet.  10-05-2019: The patient endorses heart healthy/ADA diet.   . Provided disease specific education to patient: The patient verbalized that the pcp discontinued one of his medications on Monday. His hemoglobin A1C was 5.8.  The patient checks his blood sugar every now and then. Monday it was 115.  The patient wanted to know what  his blood sugars need to be after eating. Education on fasting blood sugar <130 and post prandial <180.  The patient verbalized understanding.  Nash Dimmer with appropriate clinical care team members regarding patient needs  Patient Self Care Activities related to HTN and HLD:  . Patient is unable to independently self-manage chronic health conditions  Please see past updates related to this goal by clicking on the "Past Updates" button in the selected goal          Plan:   Telephone follow up appointment with care management team member scheduled for: 11-30-2019 at 2pm   [place], MSN, [facility] Mobile: [phone]

## 2019-10-16 LAB — HM DIABETES EYE EXAM

## 2019-11-06 ENCOUNTER — Ambulatory Visit (INDEPENDENT_AMBULATORY_CARE_PROVIDER_SITE_OTHER): Payer: Medicare Other | Admitting: Pharmacist

## 2019-11-06 DIAGNOSIS — E1165 Type 2 diabetes mellitus with hyperglycemia: Secondary | ICD-10-CM | POA: Diagnosis not present

## 2019-11-06 DIAGNOSIS — E1159 Type 2 diabetes mellitus with other circulatory complications: Secondary | ICD-10-CM | POA: Diagnosis not present

## 2019-11-06 DIAGNOSIS — I10 Essential (primary) hypertension: Secondary | ICD-10-CM

## 2019-11-06 DIAGNOSIS — I152 Hypertension secondary to endocrine disorders: Secondary | ICD-10-CM

## 2019-11-06 NOTE — Chronic Care Management (AMB) (Signed)
Chronic Care Management   Follow Up Note   11/06/2019 Name: Duane Hill. MRN: [id number] DOB: 11-15-1940  Referred by: [name], PA-C Reason for referral : Chronic Care Management (Medication Management)   Duane Hill. is a 79 y.o. year old male who is a primary care patient of [name], [place]. The CCM team was consulted for assistance with chronic disease management and care coordination needs.    Review of patient status, including review of consultants reports, relevant laboratory and other test results, and collaboration with appropriate care team members and the patient's provider was performed as part of comprehensive patient evaluation and provision of chronic care management services.    SDOH (Social Determinants of Health) assessments performed: Yes See Care Plan activities for detailed interventions related to [facility])     Outpatient Encounter Medications as of 11/06/2019  Medication Sig  . atorvastatin (LIPITOR) 20 MG tablet Take 1 tablet (20 mg total) by mouth daily.  . Azelastine HCl 0.15 % SOLN Place 2 sprays into both nostrils 2 (two) times daily. Place 2 sprays into both nostrils 2 (two) times daily.  . carvedilol (COREG) 25 MG tablet Take 1 tablet (25 mg total) by mouth 2 (two) times daily with a meal.  . Choline Fenofibrate (FENOFIBRIC ACID) 135 MG CPDR Take 1 capsule by mouth daily.  . fluticasone (FLONASE) 50 MCG/ACT nasal spray Place 1 spray into both nostrils 2 (two) times daily.  Marland Kitchen gabapentin (NEURONTIN) 300 MG capsule Take 2 capsules (600 mg total) by mouth 2 (two) times daily.  Marland Kitchen glucose blood test strip CHECK BLOOD SUGAR TWICE DAILY.  . hydrochlorothiazide (HYDRODIURIL) 25 MG tablet Take 1 tablet (25 mg total) by mouth daily.  . metFORMIN (GLUCOPHAGE XR) 500 MG 24 hr tablet Take 1 tablet (500 mg total) by mouth in the morning and at bedtime.  Marland Kitchen MICROLET LANCETS MISC USE TWICE DAILY AS DIRECTED.  Marland Kitchen montelukast (SINGULAIR) 10  MG tablet Take 1 tablet (10 mg total) by mouth at bedtime.  Marland Kitchen omeprazole (PRILOSEC) 40 MG capsule Take 1 capsule (40 mg total) by mouth daily.  . quinapril (ACCUPRIL) 40 MG tablet Take 1 tablet (40 mg total) by mouth daily.  Marland Kitchen acetaminophen (TYLENOL) 500 MG tablet Take 1,000 mg by mouth every 6 (six) hours as needed. Patient uss Caplet extra strength  . albuterol (VENTOLIN HFA) 108 (90 Base) MCG/ACT inhaler Inhale 1-2 puffs into the lungs every 6 (six) hours as needed for wheezing or shortness of breath. (Patient not taking: Reported on 11/06/2019)   No facility-administered encounter medications on file as of 11/06/2019.     Objective:   Goals Addressed              This Visit's Progress     Patient Stated   .  PharmD "I want to work on my sugars" (pt-stated)        [place] (see longtitudinal plan of care for additional care plan information)  Current Barriers:  . Diabetes: controlled; most recent A1c 5.8%   o Does report some issues with cramping overnight  . Current eGFR ~60 mL/min . Current antihyperglycemic regimen: metformin XR 500 mg BID o Max tolerated metformin dose d/t GI upset . Current glucose readings o Fasting: reports recent readings of 110, 149 . Cardiovascular risk reduction: o Current hypertensive regimen: carvedilol 25 mg BID, HCTZ 12.5 mg daily, quinapril 40 mg QAM; - Reports that he has not been checking his blood pressure  at home, because he wasn't sure what his goals are.  - Notes that he likes salty foods  o Current hyperlipidemia regimen: atorvastatin 20 mg daily, fenofibrate 135 mg daily; LDL at goal <70 . Allergies/asthma: montelukast 10 mg daily, azelastine BID, fluticasone daily - takes azelastine QAM, azelastine midday, fluticasone QPM; occasional nasal saline. Albuterol HFA PRN for outdoor allergens  . GERD: omeprazole 40 mg QPM . Neuropathy: gabapentin 600 mg BID; does note some occasional pain in hands and feet  Pharmacist Clinical  Goal(s):  Marland Kitchen Over the next 90 days, patient with work with PharmD and primary care provider to address optimized glycemic management  Interventions: . Comprehensive medication review performed, medication list updated in electronic medical record . Inter-disciplinary care team collaboration (see longitudinal plan of care) . Reviewed goal A1c, goal fasting, and goal 2 hour post prandial glucose. Encouraged occasional fasting or after meal checks to ensure no significant elevations . Reviewed goal SBP <140, <130 if possible. Encouraged occasional checks at home to evaluate control. Discussed appropriate BP check technique . Discussed using no-salt substitutes, rather than salting his food . Encouraged hydration to resolve nighttime cramps. Encouraged to discuss w/ PCP if cramping worsens or does not resolve w/ adequate hydration . Discussed role of DM in peripheral neuropathy. Encouraged to discuss w/ PCP if pain symptoms worsen  Patient Self Care Activities:  . Patient will check blood glucose periodically, document, and provide at future appointments . Patient will check blood glucose periodically, document, and provide at future appointments . Patient will take medications as prescribed . Patient will report any questions or concerns to provider   Please see past updates related to this goal by clicking on the "Past Updates" button in the selected goal          Plan:  - No current pharmacy needs identified. Will collaborate w/ RN CM to reach back out to pharmacy if future needs   Catie Darnelle Maffucci, PharmD, [place] [phone]

## 2019-11-06 NOTE — Patient Instructions (Signed)
Visit Information  Goals Addressed              This Visit's Progress     Patient Stated   .  PharmD "I want to work on my sugars" (pt-stated)        [place] (see longtitudinal plan of care for additional care plan information)  Current Barriers:  . Diabetes: controlled; most recent A1c 5.8%   o Does report some issues with cramping overnight  . Current eGFR ~60 mL/min . Current antihyperglycemic regimen: metformin XR 500 mg BID o Max tolerated metformin dose d/t GI upset . Current glucose readings o Fasting: reports recent readings of 110, 149 . Cardiovascular risk reduction: o Current hypertensive regimen: carvedilol 25 mg BID, HCTZ 12.5 mg daily, quinapril 40 mg QAM; - Reports that he has not been checking his blood pressure at home, because he wasn't sure what his goals are.  - Notes that he likes salty foods  o Current hyperlipidemia regimen: atorvastatin 20 mg daily, fenofibrate 135 mg daily; LDL at goal <70 . Allergies/asthma: montelukast 10 mg daily, azelastine BID, fluticasone daily - takes azelastine QAM, azelastine midday, fluticasone QPM; occasional nasal saline. Albuterol HFA PRN for outdoor allergens  . GERD: omeprazole 40 mg QPM . Neuropathy: gabapentin 600 mg BID; does note some occasional pain in hands and feet  Pharmacist Clinical Goal(s):  Marland Kitchen Over the next 90 days, patient with work with PharmD and primary care provider to address optimized glycemic management  Interventions: . Comprehensive medication review performed, medication list updated in electronic medical record . Inter-disciplinary care team collaboration (see longitudinal plan of care) . Reviewed goal A1c, goal fasting, and goal 2 hour post prandial glucose. Encouraged occasional fasting or after meal checks to ensure no significant elevations . Reviewed goal SBP <140, <130 if possible. Encouraged occasional checks at home to evaluate control. Discussed appropriate BP check  technique . Discussed using no-salt substitutes, rather than salting his food . Encouraged hydration to resolve nighttime cramps. Encouraged to discuss w/ PCP if cramping worsens or does not resolve w/ adequate hydration . Discussed role of DM in peripheral neuropathy. Encouraged to discuss w/ PCP if pain symptoms worsen  Patient Self Care Activities:  . Patient will check blood glucose periodically, document, and provide at future appointments . Patient will check blood glucose periodically, document, and provide at future appointments . Patient will take medications as prescribed . Patient will report any questions or concerns to provider   Please see past updates related to this goal by clicking on the "Past Updates" button in the selected goal         The patient verbalized understanding of instructions provided today and declined a print copy of patient instruction materials.   Plan:  - No current pharmacy needs identified. Will collaborate w/ RN CM to reach back out to pharmacy if future needs   Catie Darnelle Maffucci, PharmD, [place] [phone]

## 2019-11-27 ENCOUNTER — Encounter: Payer: Self-pay | Admitting: Family Medicine

## 2019-11-30 ENCOUNTER — Ambulatory Visit (INDEPENDENT_AMBULATORY_CARE_PROVIDER_SITE_OTHER): Payer: Medicare Other | Admitting: General Practice

## 2019-11-30 ENCOUNTER — Telehealth: Payer: Medicare Other | Admitting: General Practice

## 2019-11-30 DIAGNOSIS — E1159 Type 2 diabetes mellitus with other circulatory complications: Secondary | ICD-10-CM

## 2019-11-30 DIAGNOSIS — E1165 Type 2 diabetes mellitus with hyperglycemia: Secondary | ICD-10-CM | POA: Diagnosis not present

## 2019-11-30 DIAGNOSIS — E785 Hyperlipidemia, unspecified: Secondary | ICD-10-CM

## 2019-11-30 DIAGNOSIS — I10 Essential (primary) hypertension: Secondary | ICD-10-CM

## 2019-11-30 NOTE — Patient Instructions (Signed)
Visit Information  Goals Addressed              This Visit's Progress   .  COMPLETED: Diabetes (pt-stated)        Current Barriers: See new goal for DM management  . Knowledge Deficits related to basic Diabetes pathophysiology and self care/management . Transportation barriers  Case Manager Clinical Goal(s):  Over the next 120 days, patient will demonstrate improved adherence to prescribed treatment plan for diabetes self care/management as evidenced by:  . daily monitoring and recording of CBG  . adherence to ADA/ carb modified diet . exercise 3 days/week . adherence to prescribed medication regimen  Interventions:  . Provided education to patient about basic DM disease process . Reviewed medications with patient and discussed importance of medication adherence . Discussed plans with patient for ongoing care management follow up and provided patient with direct contact information for care management team . Reviewed scheduled/upcoming provider appointments including: appointment with the pcp on 06/25/2019- transportation barrier - care guide referral for help with transportation needs  . Advised patient, providing education and rationale, to check cbg daily and record, calling pcp for findings outside established parameters.    Patient Self Care Activities:  . Attends all scheduled provider appointments . Last  A1C 7.9- 2020  Please see past updates related to this goal by clicking on the "Past Updates" button in the selected goal      .  RNCM: PT-"I am more concerned about my blood pressure" (pt-stated)        CARE PLAN ENTRY (see longtitudinal plan of care for additional care plan information)  Current Barriers:  . Chronic Disease Management support, education, and care coordination needs related to HTN, HLD, and DMII . Transportation barrier . New onset of Shortness of Breath at times   Clinical Goal(s) related to HTN, HLD, and DMII:  Over the next 120 days, patient  will:  . Work with the care management team to address educational, disease management, and care coordination needs  . Begin or continue self health monitoring activities as directed today Measure and record blood pressure 1/2 times per week and adhere to a Heart healthy/ADA diet . Call provider office for new or worsened signs and symptoms Blood pressure findings outside established parameters and Shortness of breath or new signs and symptoms  related to shortness of breath or other chronic health conditions . Call care management team with questions or concerns . Verbalize basic understanding of patient centered plan of care established today  Interventions related to HTN, HLD, and DMII:  . Evaluation of current treatment plans and patient's adherence to plan as established by provider- 11-30-2019: The patient states he is doing well. Denies any new concerns at this time. . Assessed patient understanding of disease states- advised the patient range for systolic blood pressure should be 397-673 and diastolic 41-93. [id number]:  The patient has not been taking his blood pressure readings. The patient states that he doesn't understand what the numbers and what they mean. Explained what systolic and diastolic means and what the normal ranges are. The patient will start taking his blood pressure 1 or 2 times a week and recording. Education on the benefits of keeping track of blood pressure readings. Also discussed the risk of heart attack and stroke with elevation of blood pressure. The patient verbalized understanding.  . Advised the patient to talk to the pcp about the new onset of shortness of breath at times.  The patient states he  is easily getting short of breath when ambulating any distance. 11-30-2019:  The patient is pacing his activity. The patient states he is staying out of the heat.  He denies any shortness of breath except when he is really active. The patient knows what his limitations are.   . Assessed patient's education and care coordination needs- education on normal ranges of blood pressures and benefits of a heart healthy/ADA diet.  11-30-2019: The patient admits he likes salt on his food. The patient says not using salt makes him not enjoy his food as much. Education on the benefits of following a Heart Healthy/ADA diet.  . Provided disease specific education to patient: The patient verbalized that the pcp discontinued one of his medications on Monday. His hemoglobin A1C was 5.8.  The patient checks his blood sugar every now and then. Monday it was 115.  The patient wanted to know what his blood sugars need to be after eating. Education on fasting blood sugar <130 and post prandial <180.  The patient verbalized understanding. 11-30-2019: The patient does not take his blood sugars often. A couple days ago fasting it was 167.  Education on  taking blood sugars on a regular basis.  He will start taking his blood sugars more frequently and recording. Review of hypoglycemia and hyperglycemia. The patient says he listens to his body.  . Review of cramps. The patient had cramps this am. He has been trying to monitor how much water he is drinking. He has been watching this closer since the pharmacist recommended him drinking more water. He does enjoy drinking wine at supper and before bedtime. Education and support.  . Evaluation of upcoming appointments. Sees pcp again on 04-02-2020.  The patient has the [facility] number to call for needs or concerns before the next outreach.  Nash Dimmer with appropriate clinical care team members regarding patient needs.  Patient knows about the LCSW and pharmacist.  No needs at this time.   Patient Self Care Activities related to HTN, HLD, and DMII:  . Patient is unable to independently self-manage chronic health conditions  Please see past updates related to this goal by clicking on the "Past Updates" button in the selected goal         Patient verbalizes  understanding of instructions provided today.   Telephone follow up appointment with care management team member scheduled for: 02-08-2020 at 1:45 pm  [place], MSN, [facility] Mobile: [phone]

## 2019-11-30 NOTE — Chronic Care Management (AMB) (Signed)
Chronic Care Management   Follow Up Note   11/30/2019 Name: Duane Hill. MRN: [id number] DOB: Aug 14, 1940  Referred by: [name], PA-C Reason for referral : Chronic Care Management (RNCM Follow up Call: Chronic Disease Management and Care Coordination Needs)   Duane Hill. is a 79 y.o. year old male who is a primary care patient of [name], PA-C. The CCM team was consulted for assistance with chronic disease management and care coordination needs.    Review of patient status, including review of consultants reports, relevant laboratory and other test results, and collaboration with appropriate care team members and the patient's provider was performed as part of comprehensive patient evaluation and provision of chronic care management services.    SDOH (Social Determinants of Health) assessments performed: Yes See Care Plan activities for detailed interventions related to [facility])     Outpatient Encounter Medications as of 11/30/2019  Medication Sig  . acetaminophen (TYLENOL) 500 MG tablet Take 1,000 mg by mouth every 6 (six) hours as needed. Patient uss Caplet extra strength  . albuterol (VENTOLIN HFA) 108 (90 Base) MCG/ACT inhaler Inhale 1-2 puffs into the lungs every 6 (six) hours as needed for wheezing or shortness of breath. (Patient not taking: Reported on 11/06/2019)  . atorvastatin (LIPITOR) 20 MG tablet Take 1 tablet (20 mg total) by mouth daily.  . Azelastine HCl 0.15 % SOLN Place 2 sprays into both nostrils 2 (two) times daily. Place 2 sprays into both nostrils 2 (two) times daily.  . carvedilol (COREG) 25 MG tablet Take 1 tablet (25 mg total) by mouth 2 (two) times daily with a meal.  . Choline Fenofibrate (FENOFIBRIC ACID) 135 MG CPDR Take 1 capsule by mouth daily.  . fluticasone (FLONASE) 50 MCG/ACT nasal spray Place 1 spray into both nostrils 2 (two) times daily.  Marland Kitchen gabapentin (NEURONTIN) 300 MG capsule Take 2 capsules (600 mg total) by  mouth 2 (two) times daily.  Marland Kitchen glucose blood test strip CHECK BLOOD SUGAR TWICE DAILY.  . hydrochlorothiazide (HYDRODIURIL) 25 MG tablet Take 1 tablet (25 mg total) by mouth daily.  . metFORMIN (GLUCOPHAGE XR) 500 MG 24 hr tablet Take 1 tablet (500 mg total) by mouth in the morning and at bedtime.  Marland Kitchen MICROLET LANCETS MISC USE TWICE DAILY AS DIRECTED.  Marland Kitchen montelukast (SINGULAIR) 10 MG tablet Take 1 tablet (10 mg total) by mouth at bedtime.  Marland Kitchen omeprazole (PRILOSEC) 40 MG capsule Take 1 capsule (40 mg total) by mouth daily.  . quinapril (ACCUPRIL) 40 MG tablet Take 1 tablet (40 mg total) by mouth daily.   No facility-administered encounter medications on file as of 11/30/2019.     Objective:  BP Readings from Last 3 Encounters:  10/01/19 134/62  06/29/19 (!) 150/68  06/14/19 (!) 167/71    Goals Addressed              This Visit's Progress   .  COMPLETED: Diabetes (pt-stated)        Current Barriers: See new goal for DM management  . Knowledge Deficits related to basic Diabetes pathophysiology and self care/management . Transportation barriers  Case Manager Clinical Goal(s):  Over the next 120 days, patient will demonstrate improved adherence to prescribed treatment plan for diabetes self care/management as evidenced by:  . daily monitoring and recording of CBG  . adherence to ADA/ carb modified diet . exercise 3 days/week . adherence to prescribed medication regimen  Interventions:  . Provided education to patient  about basic DM disease process . Reviewed medications with patient and discussed importance of medication adherence . Discussed plans with patient for ongoing care management follow up and provided patient with direct contact information for care management team . Reviewed scheduled/upcoming provider appointments including: appointment with the pcp on 06/25/2019- transportation barrier - care guide referral for help with transportation needs  . Advised patient, providing  education and rationale, to check cbg daily and record, calling pcp for findings outside established parameters.    Patient Self Care Activities:  . Attends all scheduled provider appointments . Last  A1C 7.9- 2020  Please see past updates related to this goal by clicking on the "Past Updates" button in the selected goal      .  RNCM: PT-"I am more concerned about my blood pressure" (pt-stated)        CARE PLAN ENTRY (see longtitudinal plan of care for additional care plan information)  Current Barriers:  . Chronic Disease Management support, education, and care coordination needs related to HTN, HLD, and DMII . Transportation barrier . New onset of Shortness of Breath at times   Clinical Goal(s) related to HTN, HLD, and DMII:  Over the next 120 days, patient will:  . Work with the care management team to address educational, disease management, and care coordination needs  . Begin or continue self health monitoring activities as directed today Measure and record blood pressure 1/2 times per week and adhere to a Heart healthy/ADA diet . Call provider office for new or worsened signs and symptoms Blood pressure findings outside established parameters and Shortness of breath or new signs and symptoms  related to shortness of breath or other chronic health conditions . Call care management team with questions or concerns . Verbalize basic understanding of patient centered plan of care established today  Interventions related to HTN, HLD, and DMII:  . Evaluation of current treatment plans and patient's adherence to plan as established by provider- 11-30-2019: The patient states he is doing well. Denies any new concerns at this time. . Assessed patient understanding of disease states- advised the patient range for systolic blood pressure should be 818-299 and diastolic 37-16. [id number]:  The patient has not been taking his blood pressure readings. The patient states that he doesn't understand  what the numbers and what they mean. Explained what systolic and diastolic means and what the normal ranges are. The patient will start taking his blood pressure 1 or 2 times a week and recording. Education on the benefits of keeping track of blood pressure readings. Also discussed the risk of heart attack and stroke with elevation of blood pressure. The patient verbalized understanding.  . Advised the patient to talk to the pcp about the new onset of shortness of breath at times.  The patient states he is easily getting short of breath when ambulating any distance. 11-30-2019:  The patient is pacing his activity. The patient states he is staying out of the heat.  He denies any shortness of breath except when he is really active. The patient knows what his limitations are.  . Assessed patient's education and care coordination needs- education on normal ranges of blood pressures and benefits of a heart healthy/ADA diet.  11-30-2019: The patient admits he likes salt on his food. The patient says not using salt makes him not enjoy his food as much. Education on the benefits of following a Heart Healthy/ADA diet.  . Provided disease specific education to patient: The patient verbalized that  the pcp discontinued one of his medications on Monday. His hemoglobin A1C was 5.8.  The patient checks his blood sugar every now and then. Monday it was 115.  The patient wanted to know what his blood sugars need to be after eating. Education on fasting blood sugar <130 and post prandial <180.  The patient verbalized understanding. 11-30-2019: The patient does not take his blood sugars often. A couple days ago fasting it was 167.  Education on  taking blood sugars on a regular basis.  He will start taking his blood sugars more frequently and recording. Review of hypoglycemia and hyperglycemia. The patient says he listens to his body.  . Review of cramps. The patient had cramps this am. He has been trying to monitor how much water he  is drinking. He has been watching this closer since the pharmacist recommended him drinking more water. He does enjoy drinking wine at supper and before bedtime. Education and support.  . Evaluation of upcoming appointments. Sees pcp again on 04-02-2020.  The patient has the [facility] number to call for needs or concerns before the next outreach.  Nash Dimmer with appropriate clinical care team members regarding patient needs.  Patient knows about the LCSW and pharmacist.  No needs at this time.   Patient Self Care Activities related to HTN, HLD, and DMII:  . Patient is unable to independently self-manage chronic health conditions  Please see past updates related to this goal by clicking on the "Past Updates" button in the selected goal          Plan:   Telephone follow up appointment with care management team member scheduled for: 02-08-2020 at 1:45 pm   Noreene Larsson RN, MSN, [facility] Mobile: [phone]

## 2019-12-03 ENCOUNTER — Telehealth: Payer: Self-pay | Admitting: Family Medicine

## 2019-12-03 NOTE — Telephone Encounter (Signed)
Copied from [place] [id number]. Topic: Medicare AWV >> Dec 03, 2019 10:24 AM Cher Nakai R wrote: Reason for CRM:   Left message for patient to call back and schedule the Medicare Annual Wellness Visit (AWV) virtually.  Last AWV 12/06/2018  Please schedule at anytime with CFP-Nurse Health Advisor.  45 minute appointment  Any questions, please call me at [phone]

## 2019-12-26 ENCOUNTER — Other Ambulatory Visit: Payer: Self-pay | Admitting: Family Medicine

## 2020-01-05 ENCOUNTER — Other Ambulatory Visit: Payer: Self-pay | Admitting: Family Medicine

## 2020-01-05 NOTE — Telephone Encounter (Signed)
Requested Prescriptions  Pending Prescriptions Disp Refills   montelukast (SINGULAIR) 10 MG tablet [Pharmacy Med Name: MONTELUKAST SOD 10 MG TABLET] 90 tablet 3    Sig: Take 1 tablet (10 mg total) by mouth at bedtime.     Pulmonology:  Leukotriene Inhibitors Passed - 01/05/2020 10:30 AM      Passed - Valid encounter within last 12 months    Recent Outpatient Visits          3 months ago Type 2 diabetes mellitus with hyperglycemia, without long-term current use of insulin ([facility])   [place], Megan P, DO   6 months ago Type 2 diabetes mellitus with hyperglycemia, without long-term current use of insulin [facility])   [facility] Merrie Roof [place], [place]   8 months ago [place], Rachel [place], [place]   10 months ago Type 2 diabetes mellitus without complication, without long-term current use of insulin [facility])   [place], Lilia Argue, [place]   1 year ago Essential hypertension   [place], Jeannette How, MD      Future Appointments            In 2 months Cannady, Barbaraann Faster, NP [employer], [facility]

## 2020-01-10 DIAGNOSIS — H353131 Nonexudative age-related macular degeneration, bilateral, early dry stage: Secondary | ICD-10-CM | POA: Diagnosis not present

## 2020-01-10 DIAGNOSIS — H2513 Age-related nuclear cataract, bilateral: Secondary | ICD-10-CM | POA: Diagnosis not present

## 2020-01-17 ENCOUNTER — Other Ambulatory Visit: Payer: Medicare Other

## 2020-02-08 ENCOUNTER — Telehealth: Payer: Medicare Other | Admitting: General Practice

## 2020-02-08 ENCOUNTER — Ambulatory Visit: Payer: Self-pay | Admitting: General Practice

## 2020-02-08 DIAGNOSIS — R0602 Shortness of breath: Secondary | ICD-10-CM

## 2020-02-08 DIAGNOSIS — E785 Hyperlipidemia, unspecified: Secondary | ICD-10-CM

## 2020-02-08 DIAGNOSIS — E1165 Type 2 diabetes mellitus with hyperglycemia: Secondary | ICD-10-CM

## 2020-02-08 DIAGNOSIS — E1159 Type 2 diabetes mellitus with other circulatory complications: Secondary | ICD-10-CM

## 2020-02-08 NOTE — Chronic Care Management (AMB) (Signed)
Chronic Care Management   Follow Up Note   02/08/2020 Name: Duane Hill. MRN: [id number] DOB: [birth year]  Referred by: [name], PA-C Reason for referral : Chronic Care Management (RNCM follow up call for Chronic Disease Management and Care Coordination Needs)   Duane Hill. is a 79 y.o. year old male who is a primary care patient of [name], PA-C. The CCM team was consulted for assistance with chronic disease management and care coordination needs.    Review of patient status, including review of consultants reports, relevant laboratory and other test results, and collaboration with appropriate care team members and the patient's provider was performed as part of comprehensive patient evaluation and provision of chronic care management services.    SDOH (Social Determinants of Health) assessments performed: Yes See Care Plan activities for detailed interventions related to [facility])     Outpatient Encounter Medications as of 02/08/2020  Medication Sig  . glipiZIDE (GLUCOTROL) 5 MG tablet Take 5 mg by mouth 2 (two) times daily before a meal.  . acetaminophen (TYLENOL) 500 MG tablet Take 1,000 mg by mouth every 6 (six) hours as needed. Patient uss Caplet extra strength  . albuterol (VENTOLIN HFA) 108 (90 Base) MCG/ACT inhaler Inhale 1-2 puffs into the lungs every 6 (six) hours as needed for wheezing or shortness of breath. (Patient not taking: Reported on 11/06/2019)  . atorvastatin (LIPITOR) 20 MG tablet Take 1 tablet (20 mg total) by mouth daily.  . Azelastine HCl 0.15 % SOLN Place 2 sprays into both nostrils 2 (two) times daily. Place 2 sprays into both nostrils 2 (two) times daily.  . carvedilol (COREG) 25 MG tablet Take 1 tablet (25 mg total) by mouth 2 (two) times daily with a meal.  . Choline Fenofibrate (FENOFIBRIC ACID) 135 MG CPDR Take 1 capsule by mouth daily.  . fluticasone (FLONASE) 50 MCG/ACT nasal spray Place 1 spray into both nostrils  2 (two) times daily.  Marland Kitchen gabapentin (NEURONTIN) 300 MG capsule Take 2 capsules (600 mg total) by mouth 2 (two) times daily.  Marland Kitchen glucose blood test strip CHECK BLOOD SUGAR TWICE DAILY.  . hydrochlorothiazide (HYDRODIURIL) 25 MG tablet Take 1 tablet (25 mg total) by mouth daily.  . metFORMIN (GLUCOPHAGE XR) 500 MG 24 hr tablet Take 1 tablet (500 mg total) by mouth in the morning and at bedtime.  Marland Kitchen MICROLET LANCETS MISC USE TWICE DAILY AS DIRECTED.  Marland Kitchen montelukast (SINGULAIR) 10 MG tablet Take 1 tablet (10 mg total) by mouth at bedtime.  Marland Kitchen omeprazole (PRILOSEC) 40 MG capsule Take 1 capsule (40 mg total) by mouth daily.  . quinapril (ACCUPRIL) 40 MG tablet Take 1 tablet (40 mg total) by mouth daily.   No facility-administered encounter medications on file as of 02/08/2020.     Objective:  BP Readings from Last 3 Encounters:  02/08/20 (!) 126/55  10/01/19 134/62  06/29/19 (!) 150/68    Goals Addressed              This Visit's Progress   .  RNCM: PT-"I am more concerned about my blood pressure" (pt-stated)        CARE PLAN ENTRY (see longtitudinal plan of care for additional care plan information)  Current Barriers:  . Chronic Disease Management support, education, and care coordination needs related to HTN, HLD, and DMII . Transportation barrier . New onset of Shortness of Breath at times   Clinical Goal(s) related to HTN, HLD, and DMII:  Over  the next 120 days, patient will:  . Work with the care management team to address educational, disease management, and care coordination needs  . Begin or continue self health monitoring activities as directed today Measure and record blood pressure 1/2 times per week and adhere to a Heart healthy/ADA diet . Call provider office for new or worsened signs and symptoms Blood pressure findings outside established parameters and Shortness of breath or new signs and symptoms  related to shortness of breath or other chronic health conditions . Call  care management team with questions or concerns . Verbalize basic understanding of patient centered plan of care established today  Interventions related to HTN, HLD, and DMII:  . Evaluation of current treatment plans and patient's adherence to plan as established by provider- 02-08-2020: The patient has been having issues with his blood sugars recently. States that he checked it and it was 189.  This was before any medications because he had stopped taking the Metformin because it was causing stomach upset. He had seen Dr. Wynetta Emery and was started on glipizide 5 mg BID.  He does not check his blood sugars regularly but was feeling funny and when he checked it it was 239 this am.  He took the metformin today and feels better. Discussed coming in to see the pcp sooner than 04-02-2020 appointment but the patient declines at this time.  . Assessed patient understanding of disease states- advised the patient range for systolic blood pressure should be 324-401 and diastolic 02-72. [id number]: The patient took his blood pressure before the RNCM call and his blood pressure was 126/55.  Praised the patient for taking blood pressures and recording.  Education provided on orthostatic hypotension and if he has light headedness or dizziness to change position slowly. Explained what systolic and diastolic means and what the normal ranges are.  Education on the benefits of keeping track of blood pressure readings. Also discussed the risk of heart attack and stroke with elevation of blood pressure. The patient verbalized understanding.  . Review of upcoming cataract surgery pre-surgical documentation for 02-18-2020.  The patient states he is not going to have the procedure. The patient does not feel he needs to have it.  He can see well unless it is very sunny outside. The patient does not want to go back to the provider there.  Empathetic listening and support.  . Advised the patient to talk to the pcp about the new onset of  shortness of breath at times.  The patient states he is easily getting short of breath when ambulating any distance. 02-08-2020:  The patient is pacing his activity. The patient states he is staying out of the heat.  He denies any shortness of breath except when he is really active. The patient knows what his limitations are.  . Assessed patient's education and care coordination needs- education on normal ranges of blood pressures and benefits of a heart healthy/ADA diet.  02-08-2020: The patient admits he likes salt on his food. The patient says not using salt makes him not enjoy his food as much. Education on the benefits of following a Heart Healthy/ADA diet.  . Provided disease specific education to patient: The patient verbalized that the pcp discontinued one of his medications on Monday. His hemoglobin A1C was 5.8.  The patient checks his blood sugar every now and then. Monday it was 115.  The patient wanted to know what his blood sugars need to be after eating. Education on fasting blood sugar <  130 and post prandial <180.  The patient verbalized understanding. 02-08-2020: The patient does not take his blood sugars often.  The patient the last few days has been taking more often because of the way he is feeling.. A couple days ago fasting it was 189.  This am it was 239. Review again of fasting numbers and post prandial numbers.  Education on  taking blood sugars on a regular basis.  He will start taking his blood sugars more frequently and recording. Review of hypoglycemia and hyperglycemia. The patient says he listens to his body.  . Review of cramps. The patient had cramps this am. He has been trying to monitor how much water he is drinking. He has been watching this closer since the pharmacist recommended him drinking more water. He does enjoy drinking wine at supper and before bedtime. Education and support.  . Evaluation of upcoming appointments. Sees pcp again on 04-02-2020.  The patient has the  [facility] number to call for needs or concerns before the next outreach.  Nash Dimmer with appropriate clinical care team members regarding patient needs.  Patient knows about the LCSW and pharmacist.  No needs at this time.   Patient Self Care Activities related to HTN, HLD, and DMII:  . Patient is unable to independently self-manage chronic health conditions  Please see past updates related to this goal by clicking on the "Past Updates" button in the selected goal          Plan:   Telephone follow up appointment with care management team member scheduled for: 04-09-2020 at 2:30 pm   Noreene Larsson RN, MSN, [facility] Mobile: [phone]

## 2020-02-08 NOTE — Patient Instructions (Signed)
Visit Information  Goals Addressed              This Visit's Progress   .  RNCM: PT-"I am more concerned about my blood pressure" (pt-stated)        CARE PLAN ENTRY (see longtitudinal plan of care for additional care plan information)  Current Barriers:  . Chronic Disease Management support, education, and care coordination needs related to HTN, HLD, and DMII . Transportation barrier . New onset of Shortness of Breath at times   Clinical Goal(s) related to HTN, HLD, and DMII:  Over the next 120 days, patient will:  . Work with the care management team to address educational, disease management, and care coordination needs  . Begin or continue self health monitoring activities as directed today Measure and record blood pressure 1/2 times per week and adhere to a Heart healthy/ADA diet . Call provider office for new or worsened signs and symptoms Blood pressure findings outside established parameters and Shortness of breath or new signs and symptoms  related to shortness of breath or other chronic health conditions . Call care management team with questions or concerns . Verbalize basic understanding of patient centered plan of care established today  Interventions related to HTN, HLD, and DMII:  . Evaluation of current treatment plans and patient's adherence to plan as established by provider- 02-08-2020: The patient has been having issues with his blood sugars recently. States that he checked it and it was 189.  This was before any medications because he had stopped taking the Metformin because it was causing stomach upset. He had seen Dr. Wynetta Emery and was started on glipizide 5 mg BID.  He does not check his blood sugars regularly but was feeling funny and when he checked it it was 239 this am.  He took the metformin today and feels better. Discussed coming in to see the pcp sooner than 04-02-2020 appointment but the patient declines at this time.  . Assessed patient understanding of  disease states- advised the patient range for systolic blood pressure should be 546-270 and diastolic 35-00. [id number]: The patient took his blood pressure before the RNCM call and his blood pressure was 126/55.  Praised the patient for taking blood pressures and recording.  Education provided on orthostatic hypotension and if he has light headedness or dizziness to change position slowly. Explained what systolic and diastolic means and what the normal ranges are.  Education on the benefits of keeping track of blood pressure readings. Also discussed the risk of heart attack and stroke with elevation of blood pressure. The patient verbalized understanding.  . Review of upcoming cataract surgery pre-surgical documentation for 02-18-2020.  The patient states he is not going to have the procedure. The patient does not feel he needs to have it.  He can see well unless it is very sunny outside. The patient does not want to go back to the provider there.  Empathetic listening and support.  . Advised the patient to talk to the pcp about the new onset of shortness of breath at times.  The patient states he is easily getting short of breath when ambulating any distance. 02-08-2020:  The patient is pacing his activity. The patient states he is staying out of the heat.  He denies any shortness of breath except when he is really active. The patient knows what his limitations are.  . Assessed patient's education and care coordination needs- education on normal ranges of blood pressures and benefits of a heart  healthy/ADA diet.  02-08-2020: The patient admits he likes salt on his food. The patient says not using salt makes him not enjoy his food as much. Education on the benefits of following a Heart Healthy/ADA diet.  . Provided disease specific education to patient: The patient verbalized that the pcp discontinued one of his medications on Monday. His hemoglobin A1C was 5.8.  The patient checks his blood sugar every now and  then. Monday it was 115.  The patient wanted to know what his blood sugars need to be after eating. Education on fasting blood sugar <130 and post prandial <180.  The patient verbalized understanding. 02-08-2020: The patient does not take his blood sugars often.  The patient the last few days has been taking more often because of the way he is feeling.. A couple days ago fasting it was 189.  This am it was 239. Review again of fasting numbers and post prandial numbers.  Education on  taking blood sugars on a regular basis.  He will start taking his blood sugars more frequently and recording. Review of hypoglycemia and hyperglycemia. The patient says he listens to his body.  . Review of cramps. The patient had cramps this am. He has been trying to monitor how much water he is drinking. He has been watching this closer since the pharmacist recommended him drinking more water. He does enjoy drinking wine at supper and before bedtime. Education and support.  . Evaluation of upcoming appointments. Sees pcp again on 04-02-2020.  The patient has the [facility] number to call for needs or concerns before the next outreach.  Nash Dimmer with appropriate clinical care team members regarding patient needs.  Patient knows about the LCSW and pharmacist.  No needs at this time.   Patient Self Care Activities related to HTN, HLD, and DMII:  . Patient is unable to independently self-manage chronic health conditions  Please see past updates related to this goal by clicking on the "Past Updates" button in the selected goal         Patient verbalizes understanding of instructions provided today.   Telephone follow up appointment with care management team member scheduled for: 04-09-2020 at 230 pm  Noreene Larsson RN, MSN, [facility] Mobile: [phone]

## 2020-02-14 ENCOUNTER — Other Ambulatory Visit: Payer: Medicare Other

## 2020-02-18 ENCOUNTER — Ambulatory Visit: Admit: 2020-02-18 | Payer: Medicare Other | Admitting: Ophthalmology

## 2020-02-18 SURGERY — PHACOEMULSIFICATION, CATARACT, WITH IOL INSERTION
Anesthesia: Topical | Laterality: Left

## 2020-02-25 ENCOUNTER — Other Ambulatory Visit: Payer: Self-pay | Admitting: Family Medicine

## 2020-03-25 ENCOUNTER — Telehealth: Payer: Self-pay | Admitting: Family Medicine

## 2020-03-25 NOTE — Telephone Encounter (Signed)
Copied from [place] [phone]. Topic: Medicare AWV >> Mar 25, 2020 10:48 AM Cher Nakai R wrote: Reason for CRM:  No answer Voice mail full unable to leave message for patient to call back and schedule the Medicare Annual Wellness Visit (AWV) virtually.  Last AWV 12/06/2018  Please schedule at anytime with [facility]-Nurse Health Advisor.  45 minute appointment  Any questions, please call me at [phone]

## 2020-03-31 ENCOUNTER — Other Ambulatory Visit: Payer: Self-pay | Admitting: Family Medicine

## 2020-03-31 NOTE — Telephone Encounter (Signed)
Requested Prescriptions  Refused Prescriptions Disp Refills  . glipiZIDE (GLUCOTROL) 5 MG tablet [Pharmacy Med Name: GLIPIZIDE 5 MG TABLET] 180 tablet 0    Sig: Take 1 tablet (5 mg total) by mouth 2 (two) times daily before a meal.     Endocrinology:  Diabetes - Sulfonylureas Failed - 03/31/2020  9:14 AM      Failed - HBA1C is between 0 and 7.9 and within 180 days    Hemoglobin A1C  Date Value Ref Range Status  08/15/2014 6.7 (H) % Final    Comment:    4.0-6.0 NOTE: New Reference Range  06/25/14    HB A1C (BAYER DCA - WAIVED)  Date Value Ref Range Status  10/01/2019 5.8 <7.0 % Final    Comment:                                          Diabetic Adult            <7.0                                       Healthy Adult        4.3 - 5.7                                                           (DCCT/NGSP) American Diabetes Association's Summary of Glycemic Recommendations for Adults with Diabetes: Hemoglobin A1c <7.0%. More stringent glycemic goals (A1c <6.0%) may further reduce complications at the cost of increased risk of hypoglycemia.          Failed - Valid encounter within last 6 months    Recent Outpatient Visits          6 months ago Type 2 diabetes mellitus with hyperglycemia, without long-term current use of insulin ([place])   [facility], Megan P, DO   9 months ago Type 2 diabetes mellitus with hyperglycemia, without long-term current use of insulin [facility])   [facility] [name], [place]   10 months ago [place], [place], [place]   1 year ago Type 2 diabetes mellitus without complication, without long-term current use of insulin [facility]   [facility] [name], [place]   1 year ago Essential hypertension   [facility] Guadalupe Maple, MD             Glipizide was d/c'd per Dr. Wynetta Emery on 10/01/19 with plan to recheck pt. In 6 mos.  Attempted to call  pt. To schedule 6 mo. F/u.  Unable to reach pt., and vm full, so unable to leave message.

## 2020-04-02 ENCOUNTER — Ambulatory Visit: Payer: Medicare Other | Admitting: Family Medicine

## 2020-04-02 ENCOUNTER — Ambulatory Visit: Payer: Medicare Other | Admitting: Nurse Practitioner

## 2020-04-09 ENCOUNTER — Telehealth: Payer: Medicare Other | Admitting: General Practice

## 2020-04-09 ENCOUNTER — Ambulatory Visit: Payer: Self-pay | Admitting: General Practice

## 2020-04-09 DIAGNOSIS — E785 Hyperlipidemia, unspecified: Secondary | ICD-10-CM

## 2020-04-09 DIAGNOSIS — R0602 Shortness of breath: Secondary | ICD-10-CM

## 2020-04-09 DIAGNOSIS — E1159 Type 2 diabetes mellitus with other circulatory complications: Secondary | ICD-10-CM

## 2020-04-09 DIAGNOSIS — E1165 Type 2 diabetes mellitus with hyperglycemia: Secondary | ICD-10-CM

## 2020-04-09 NOTE — Patient Instructions (Signed)
Visit Information  Goals Addressed              This Visit's Progress     RNCM: PT-"I am more concerned about my blood pressure" (pt-stated)        CARE PLAN ENTRY (see longtitudinal plan of care for additional care plan information)  Current Barriers:   Chronic Disease Management support, education, and care coordination needs related to HTN, HLD, and DMII  Transportation barrier  New onset of Shortness of Breath at times   Clinical Goal(s) related to HTN, HLD, and DMII:  Over the next 120 days, patient will:   Work with the care management team to address educational, disease management, and care coordination needs   Begin or continue self health monitoring activities as directed today Measure and record blood pressure 1/2 times per week and adhere to a Heart healthy/ADA diet  Call provider office for new or worsened signs and symptoms Blood pressure findings outside established parameters and Shortness of breath or new signs and symptoms  related to shortness of breath or other chronic health conditions  Call care management team with questions or concerns  Verbalize basic understanding of patient centered plan of care established today  Interventions related to HTN, HLD, and DMII:   Evaluation of current treatment plans and patient's adherence to plan as established by provider- 02-08-2020: The patient has been having issues with his blood sugars recently. States that he checked it and it was 189.  This was before any medications because he had stopped taking the Metformin because it was causing stomach upset. He had seen Dr. Wynetta Emery and was started on glipizide 5 mg BID.  He does not check his blood sugars regularly but was feeling funny and when he checked it it was 239 this am.  He took the metformin today and feels better.04-09-2020: The patient could not come to his appointment on the 15th but does want to talk to the pcp about his DM management. Was taken off of  glipizide but when his blood sugars went up he started taking it again until he ran out. He tried to get refilled but they didn't refill it. The patient states that he is sure the reason is because Dr. Wynetta Emery took him off of it. Education on talking to the pcp about this at his appointment that he is calling to get rescheduled.    Assessed patient understanding of disease states- advised the patient range for systolic blood pressure should be 0000000 and diastolic [id number]. [id number]: The patient took his blood pressure before the RNCM call and his blood pressure was 126/55.  Praised the patient for taking blood pressures and recording.  Education provided on orthostatic hypotension and if he has light headedness or dizziness to change position slowly. Explained what systolic and diastolic means and what the normal ranges are.  Education on the benefits of keeping track of blood pressure readings. Also discussed the risk of heart attack and stroke with elevation of blood pressure. The patient verbalized understanding. 04-09-2020: The patient states that his blood pressure today was 126/62.  The patient usually has a low number on the bottom.  Review of systolic [id number] and diastolic [id number] to call pcp for recommendations.   Review of upcoming cataract surgery pre-surgical documentation for 02-18-2020.  The patient states he is not going to have the procedure. The patient does not feel he needs to have it.  He can see well unless it is very sunny outside. The patient does  not want to go back to the provider there.  Empathetic listening and support. 04-09-2020: The patient did not allow the eye doctor to do surgery in November.   Advised the patient to talk to the pcp about the new onset of shortness of breath at times.  The patient states he is easily getting short of breath when ambulating any distance. 04-09-2020:  The patient is pacing his activity. The patient states he is staying out of the heat.  He denies any  shortness of breath except when he is really active. The patient knows what his limitations are.   Assessed patient's education and care coordination needs- education on normal ranges of blood pressures and benefits of a heart healthy/ADA diet.  04-09-2020: The patient admits he likes salt on his food. The patient says not using salt makes him not enjoy his food as much. Education on the benefits of following a Heart Healthy/ADA diet.   Provided disease specific education to patient: The patient verbalized that the pcp discontinued one of his medications on Monday. His hemoglobin A1C was 5.8.  The patient checks his blood sugar every now and then. Monday it was 115.  The patient wanted to know what his blood sugars need to be after eating. Education on fasting blood sugar <130 and post prandial <180.  The patient verbalized understanding. 02-08-2020: The patient does not take his blood sugars often.  The patient the last few days has been taking more often because of the way he is feeling.. A couple days ago fasting it was 189.  This am it was 239. Review again of fasting numbers and post prandial numbers.  Education on  taking blood sugars on a regular basis.  He will start taking his blood sugars more frequently and recording. Review of hypoglycemia and hyperglycemia. The patient says he listens to his body. 04-09-2020: The patient states that his last blood sugar check was 141.  He does want to talk to pcp about medication needs and possibly if he should go back to taking Metformin.  Education on writing down questions to ask the pcp.   Review of cramps. The patient had cramps this am. He has been trying to monitor how much water he is drinking. He has been watching this closer since the pharmacist recommended him drinking more water. He does enjoy drinking wine at supper and before bedtime. Education and support. 04-09-2020: The patient states he has no issues with cramps.   Evaluation of upcoming  appointments. The patient was unable to keep his appointment on 04-02-2020 and will call the office and reschedule.  He had issues with the car not running and now has this fixed.   Collaborated with appropriate clinical care team members regarding patient needs.  Patient knows about the LCSW and pharmacist.  No needs at this time.   Patient Self Care Activities related to HTN, HLD, and DMII:   Patient is unable to independently self-manage chronic health conditions  Please see past updates related to this goal by clicking on the "Past Updates" button in the selected goal         The patient verbalized understanding of instructions, educational materials, and care plan provided today and declined offer to receive copy of patient instructions, educational materials, and care plan.   Telephone follow up appointment with care management team member scheduled for: 06-11-2020 at 2:30 pm  [place], MSN, [place]  Mobile: [phone]

## 2020-04-09 NOTE — Chronic Care Management (AMB) (Signed)
Chronic Care Management   Follow Up Note   04/09/2020 Name: Duane Hill. MRN: [id number] DOB: [birth year]  Referred by: Duane American, PA-C Reason for referral : Chronic Care Management (RNCM follow up call for Chronic Disease Management and Care Coordination Needs)   Duane Hill. is a 79 y.o. year old male who is a primary care patient of Duane American, PA-C. The CCM team was consulted for assistance with chronic disease management and care coordination needs.    Review of patient status, including review of consultants reports, relevant laboratory and other test results, and collaboration with appropriate care team members and the patient's provider was performed as part of comprehensive patient evaluation and provision of chronic care management services.    SDOH (Social Determinants of Health) assessments performed: Yes See Care Plan activities for detailed interventions related to [facility])     Outpatient Encounter Medications as of 04/09/2020  Medication Sig  . acetaminophen (TYLENOL) 500 MG tablet Take 1,000 mg by mouth every 6 (six) hours as needed. Patient uss Caplet extra strength  . albuterol (VENTOLIN HFA) 108 (90 Base) MCG/ACT inhaler Inhale 1-2 puffs into the lungs every 6 (six) hours as needed for wheezing or shortness of breath. (Patient not taking: Reported on 11/06/2019)  . atorvastatin (LIPITOR) 20 MG tablet Take 1 tablet (20 mg total) by mouth daily.  . Azelastine HCl 0.15 % SOLN Place 2 sprays into both nostrils 2 (two) times daily. Place 2 sprays into both nostrils 2 (two) times daily.  . carvedilol (COREG) 25 MG tablet Take 1 tablet (25 mg total) by mouth 2 (two) times daily with a meal.  . Choline Fenofibrate (FENOFIBRIC ACID) 135 MG CPDR Take 1 capsule by mouth daily.  . fluticasone (FLONASE) 50 MCG/ACT nasal spray Place 1 spray into both nostrils 2 (two) times daily.  Marland Kitchen gabapentin (NEURONTIN) 300 MG capsule Take 2 capsules (600 mg  total) by mouth 2 (two) times daily.  Marland Kitchen glipiZIDE (GLUCOTROL) 5 MG tablet Take 5 mg by mouth 2 (two) times daily before a meal.  . glucose blood test strip CHECK BLOOD SUGAR TWICE DAILY.  . hydrochlorothiazide (HYDRODIURIL) 25 MG tablet Take 1 tablet (25 mg total) by mouth daily.  . metFORMIN (GLUCOPHAGE XR) 500 MG 24 hr tablet Take 1 tablet (500 mg total) by mouth in the morning and at bedtime.  Marland Kitchen MICROLET LANCETS MISC USE TWICE DAILY AS DIRECTED.  Marland Kitchen montelukast (SINGULAIR) 10 MG tablet Take 1 tablet (10 mg total) by mouth at bedtime.  Marland Kitchen omeprazole (PRILOSEC) 40 MG capsule Take 1 capsule (40 mg total) by mouth daily.  . quinapril (ACCUPRIL) 40 MG tablet Take 1 tablet (40 mg total) by mouth daily.   No facility-administered encounter medications on file as of 04/09/2020.     Objective:  BP Readings from Last 3 Encounters:  02/08/20 (!) 126/55  10/01/19 134/62  06/29/19 (!) 150/68    Goals Addressed              This Visit's Progress   .  RNCM: PT-"I am more concerned about my blood pressure" (pt-stated)        CARE PLAN ENTRY (see longtitudinal plan of care for additional care plan information)  Current Barriers:  . Chronic Disease Management support, education, and care coordination needs related to HTN, HLD, and DMII . Transportation barrier . New onset of Shortness of Breath at times   Clinical Goal(s) related to HTN, HLD, and DMII:  Over  the next 120 days, patient will:  . Work with the care management team to address educational, disease management, and care coordination needs  . Begin or continue self health monitoring activities as directed today Measure and record blood pressure 1/2 times per week and adhere to a Heart healthy/ADA diet . Call provider office for new or worsened signs and symptoms Blood pressure findings outside established parameters and Shortness of breath or new signs and symptoms  related to shortness of breath or other chronic health  conditions . Call care management team with questions or concerns . Verbalize basic understanding of patient centered plan of care established today  Interventions related to HTN, HLD, and DMII:  . Evaluation of current treatment plans and patient's adherence to plan as established by provider- 02-08-2020: The patient has been having issues with his blood sugars recently. States that he checked it and it was 189.  This was before any medications because he had stopped taking the Metformin because it was causing stomach upset. He had seen Dr. Wynetta Hill and was started on glipizide 5 mg BID.  He does not check his blood sugars regularly but was feeling funny and when he checked it it was 239 this am.  He took the metformin today and feels better.04-09-2020: The patient could not come to his appointment on the 15th but does want to talk to the pcp about his DM management. Was taken off of glipizide but when his blood sugars went up he started taking it again until he ran out. He tried to get refilled but they didn't refill it. The patient states that he is sure the reason is because Dr. Wynetta Hill took him off of it. Education on talking to the pcp about this at his appointment that he is calling to get rescheduled.   . Assessed patient understanding of disease states- advised the patient range for systolic blood pressure should be 211-941 and diastolic 74-08. [id number]: The patient took his blood pressure before the RNCM call and his blood pressure was 126/55.  Praised the patient for taking blood pressures and recording.  Education provided on orthostatic hypotension and if he has light headedness or dizziness to change position slowly. Explained what systolic and diastolic means and what the normal ranges are.  Education on the benefits of keeping track of blood pressure readings. Also discussed the risk of heart attack and stroke with elevation of blood pressure. The patient verbalized understanding. 04-09-2020:  The patient states that his blood pressure today was 126/62.  The patient usually has a low number on the bottom.  Review of systolic 314> and diastolic >97 to call pcp for recommendations.  . Review of upcoming cataract surgery pre-surgical documentation for 02-18-2020.  The patient states he is not going to have the procedure. The patient does not feel he needs to have it.  He can see well unless it is very sunny outside. The patient does not want to go back to the provider there.  Empathetic listening and support. 04-09-2020: The patient did not allow the eye doctor to do surgery in November.  . Advised the patient to talk to the pcp about the new onset of shortness of breath at times.  The patient states he is easily getting short of breath when ambulating any distance. 04-09-2020:  The patient is pacing his activity. The patient states he is staying out of the heat.  He denies any shortness of breath except when he is really active. The patient knows what  his limitations are.  . Assessed patient's education and care coordination needs- education on normal ranges of blood pressures and benefits of a heart healthy/ADA diet.  04-09-2020: The patient admits he likes salt on his food. The patient says not using salt makes him not enjoy his food as much. Education on the benefits of following a Heart Healthy/ADA diet.  . Provided disease specific education to patient: The patient verbalized that the pcp discontinued one of his medications on Monday. His hemoglobin A1C was 5.8.  The patient checks his blood sugar every now and then. Monday it was 115.  The patient wanted to know what his blood sugars need to be after eating. Education on fasting blood sugar <130 and post prandial <180.  The patient verbalized understanding. 02-08-2020: The patient does not take his blood sugars often.  The patient the last few days has been taking more often because of the way he is feeling.. A couple days ago fasting it was 189.   This am it was 239. Review again of fasting numbers and post prandial numbers.  Education on  taking blood sugars on a regular basis.  He will start taking his blood sugars more frequently and recording. Review of hypoglycemia and hyperglycemia. The patient says he listens to his body. 04-09-2020: The patient states that his last blood sugar check was 141.  He does want to talk to pcp about medication needs and possibly if he should go back to taking Metformin.  Education on writing down questions to ask the pcp.  . Review of cramps. The patient had cramps this am. He has been trying to monitor how much water he is drinking. He has been watching this closer since the pharmacist recommended him drinking more water. He does enjoy drinking wine at supper and before bedtime. Education and support. 04-09-2020: The patient states he has no issues with cramps.  . Evaluation of upcoming appointments. The patient was unable to keep his appointment on 04-02-2020 and will call the office and reschedule.  He had issues with the car not running and now has this fixed.  Steele Sizer with appropriate clinical care team members regarding patient needs.  Patient knows about the LCSW and pharmacist.  No needs at this time.   Patient Self Care Activities related to HTN, HLD, and DMII:  . Patient is unable to independently self-manage chronic health conditions  Please see past updates related to this goal by clicking on the "Past Updates" button in the selected goal           Plan:   Telephone follow up appointment with care management team member scheduled for: 06-11-2020 at 230pm   Alto Denver RN, MSN, CCM Community Care Coordinator [place]  [facility] Mobile: [phone]

## 2020-04-26 ENCOUNTER — Encounter: Payer: Self-pay | Admitting: Nurse Practitioner

## 2020-04-28 ENCOUNTER — Ambulatory Visit (INDEPENDENT_AMBULATORY_CARE_PROVIDER_SITE_OTHER): Payer: Medicare Other | Admitting: Nurse Practitioner

## 2020-04-28 ENCOUNTER — Other Ambulatory Visit: Payer: Self-pay

## 2020-04-28 ENCOUNTER — Encounter: Payer: Self-pay | Admitting: Nurse Practitioner

## 2020-04-28 VITALS — BP 128/76 | HR 54 | Temp 97.6°F | Ht 66.85 in | Wt 172.0 lb

## 2020-04-28 DIAGNOSIS — E785 Hyperlipidemia, unspecified: Secondary | ICD-10-CM

## 2020-04-28 DIAGNOSIS — E1159 Type 2 diabetes mellitus with other circulatory complications: Secondary | ICD-10-CM

## 2020-04-28 DIAGNOSIS — Z23 Encounter for immunization: Secondary | ICD-10-CM

## 2020-04-28 DIAGNOSIS — E1169 Type 2 diabetes mellitus with other specified complication: Secondary | ICD-10-CM

## 2020-04-28 DIAGNOSIS — E1165 Type 2 diabetes mellitus with hyperglycemia: Secondary | ICD-10-CM | POA: Diagnosis not present

## 2020-04-28 DIAGNOSIS — I152 Hypertension secondary to endocrine disorders: Secondary | ICD-10-CM | POA: Diagnosis not present

## 2020-04-28 DIAGNOSIS — E538 Deficiency of other specified B group vitamins: Secondary | ICD-10-CM

## 2020-04-28 LAB — BAYER DCA HB A1C WAIVED: HB A1C (BAYER DCA - WAIVED): 7.2 % — ABNORMAL HIGH (ref <7.0)

## 2020-04-28 LAB — MICROALBUMIN, URINE WAIVED
Creatinine, Urine Waived: 200 mg/dL (ref 10–300)
Microalb, Ur Waived: 30 mg/L — ABNORMAL HIGH (ref 0–19)
Microalb/Creat Ratio: <30 mg/g (ref <30)

## 2020-04-28 NOTE — Assessment & Plan Note (Signed)
Chronic, ongoing.  Continue current medication regimen and adjust as needed. Lipid panel today. 

## 2020-04-28 NOTE — Progress Notes (Signed)
BP 128/76   Pulse (!) 54   Temp 97.6 F (36.4 C) (Oral)   Ht 5' 6.85" (1.698 m)   Wt 172 lb (78 kg)   SpO2 97%   BMI 27.06 kg/m    Subjective:    Patient ID: Duane Edelman., male    DOB: 01-21-1941, 80 y.o.   MRN: [id number]  HPI: Duane Klopf. is a 80 y.o. male  Chief Complaint  Patient presents with  . Diabetes   DIABETES Last A1c in office June 2021 5.8%.  Continues on Metformin 500 MG BID and Glipizide -- was taken off Glipizide at last visit per his report.  Takes Gabapentin for neuropathy discomfort.  Has tried taking 1000 MG BID of Metformin before but this caused GI upset.  Has taken Jardiance in past, which caused a UTI.  Has not tried GLP1.    Has cataracts and is awaiting surgery, reports not feeling good connection with surgeon he met. Hypoglycemic episodes:no Polydipsia/polyuria: no Visual disturbance: no Chest pain: no Paresthesias: no Glucose Monitoring: yes  Accucheck frequency: Daily  Fasting glucose: 175 this morning -- it varies  Post prandial:  Evening:  Before meals: Taking Insulin?: no  Long acting insulin:  Short acting insulin: Blood Pressure Monitoring: rarely Retinal Examination: Up to Date -- was told by Dr. Ellin Mayhew he has cataracts -- was set up for surgery in [place], but did not go as had a bad experience Foot Exam: Up to Date Pneumovax: Up to Date Influenza: Up to Date Aspirin: no   HYPERTENSION / HYPERLIPIDEMIA Continues on Atorvastatin, Carvedilol, Fenofibric, HCTZ, and Accupril.   He smoked from age 53 to 78 range -- 30 years.  Smoked 1 PPD.   Satisfied with current treatment? yes Duration of hypertension: chronic BP monitoring frequency: rarely BP range:  BP medication side effects: no Duration of hyperlipidemia: chronic Cholesterol medication side effects: no Cholesterol supplements: none Medication compliance: good compliance Aspirin: no Recent stressors: no Recurrent headaches: no Visual changes:  no Palpitations: no Dyspnea: only occasionally with walking up stairs, former smoker Chest pain: no Lower extremity edema: no Dizzy/lightheaded: no  Relevant past medical, surgical, family and social history reviewed and updated as indicated. Interim medical history since our last visit reviewed. Allergies and medications reviewed and updated.  Review of Systems  Constitutional: Negative for activity change, diaphoresis, fatigue and fever.  Respiratory: Negative for cough, chest tightness, shortness of breath and wheezing.   Cardiovascular: Negative for chest pain, palpitations and leg swelling.  Gastrointestinal: Negative.   Endocrine: Negative for polydipsia, polyphagia and polyuria.  Neurological: Negative.   Psychiatric/Behavioral: Negative.     Per HPI unless specifically indicated above     Objective:    BP 128/76   Pulse (!) 54   Temp 97.6 F (36.4 C) (Oral)   Ht 5' 6.85" (1.698 m)   Wt 172 lb (78 kg)   SpO2 97%   BMI 27.06 kg/m   Wt Readings from Last 3 Encounters:  04/28/20 172 lb (78 kg)  10/01/19 173 lb (78.5 kg)  06/29/19 169 lb (76.7 kg)    Physical Exam Vitals and nursing note reviewed.  Constitutional:      General: He is awake. He is not in acute distress.    Appearance: He is well-developed and well-groomed. He is not ill-appearing.  HENT:     Head: Normocephalic and atraumatic.     Right Ear: Hearing normal. No drainage.     Left Ear: Hearing  normal. No drainage.  Eyes:     General: Lids are normal.        Right eye: No discharge.        Left eye: No discharge.     Conjunctiva/sclera: Conjunctivae normal.     Pupils: Pupils are equal, round, and reactive to light.  Neck:     Thyroid: No thyromegaly.     Vascular: No carotid bruit or JVD.     Trachea: Trachea normal.  Cardiovascular:     Rate and Rhythm: Normal rate and regular rhythm.     Heart sounds: Normal heart sounds, S1 normal and S2 normal. No murmur heard. No gallop.   Pulmonary:      Effort: Pulmonary effort is normal.     Breath sounds: Normal breath sounds.  Abdominal:     General: Bowel sounds are normal.     Palpations: Abdomen is soft. There is no hepatomegaly or splenomegaly.  Musculoskeletal:        General: Normal range of motion.     Cervical back: Normal range of motion and neck supple.     Right lower leg: No edema.     Left lower leg: No edema.  Skin:    General: Skin is warm and dry.     Capillary Refill: Capillary refill takes less than 2 seconds.  Neurological:     Mental Status: He is alert and oriented to person, place, and time.  Psychiatric:        Attention and Perception: Attention normal.        Mood and Affect: Mood normal.        Speech: Speech normal.        Behavior: Behavior normal. Behavior is cooperative.        Thought Content: Thought content normal.    Diabetic Foot Exam - Simple   Simple Foot Form Visual Inspection See comments: Yes Sensation Testing Intact to touch and monofilament testing bilaterally: Yes Pulse Check Posterior Tibialis and Dorsalis pulse intact bilaterally: Yes Comments Pulses bilateral 2+ PT/DP.  Sensation left 8/10 and right 9/10.  Onychomycosis bilateral feet noted.    Results for orders placed or performed in visit on 04/28/20  Bayer DCA Hb A1c Waived  Result Value Ref Range   HB A1C (BAYER DCA - WAIVED) 7.2 (H) <7.0 %  Microalbumin, Urine Waived  Result Value Ref Range   Microalb, Ur Waived 30 (H) 0 - 19 mg/L   Creatinine, Urine Waived 200 10 - 300 mg/dL   Microalb/Creat Ratio <30 <30 mg/g      Assessment & Plan:   Problem List Items Addressed This Visit      Cardiovascular and Mediastinum   Hypertension associated with diabetes ([place])    Chronic, ongoing with BP at goal.  Recommend he monitor BP at least a few mornings a week at home and document.  DASH diet at home.  Continue current medication regimen and adjust as needed.  Labs today: BMP and TSH.  Continue Accupril for kidney  protection. Return in 3 months to meet new PCP.       Relevant Orders   TSH   Basic metabolic panel   Bayer DCA Hb A1c Waived (Completed)   Microalbumin, Urine Waived (Completed)     Endocrine   Hyperlipidemia associated with type 2 diabetes mellitus (HCC)    Chronic, ongoing.  Continue current medication regimen and adjust as needed.  Lipid panel today.         Relevant  Orders   Lipid Panel w/o Chol/HDL Ratio   Bayer DCA Hb A1c Waived (Completed)   Type 2 diabetes mellitus with hyperglycemia (HCC) - Primary    Chronic, ongoing with A1c trending up today to 7.2% and urine ALB 30.  Continue current medication regimen at this time and consider trial of GLP1, like Trulicity, next visit if ongoing elevations >7%.  He can not tolerate increased Metformin due to GI symptoms and had urine infections with SGLT2.  Would avoid Glipizide due to his age and having hypoglycemia episodes with this in past.  Educated him on GLP1 today and he is aware that would be a beneficial next step if unable to get A1c below goal with current Metformin dosing and diet changes at home.  Recommend he monitor BS daily and document for next visit + focus heavily on diet changes (less potatoes, which are his favorite).  Continue Accupril for kidney protection.  Return in 3 months.       Relevant Orders   Bayer DCA Hb A1c Waived (Completed)   Microalbumin, Urine Waived (Completed)    Other Visit Diagnoses    B12 deficiency       History of low levels and neuropathy discomfort on long term Metformin, check B12 today and start supplement as needed.   Relevant Orders   Vitamin B12   Need for influenza vaccination       Flu vaccine today   Relevant Orders   Flu Vaccine QUAD High Dose(Fluad) (Completed)       Follow up plan: Return in about 3 months (around 07/27/2020) for T2DM, HTN/HLD -- to meet new PCP -- may need GLP1 if A1c remains elevated.

## 2020-04-28 NOTE — Assessment & Plan Note (Addendum)
Chronic, ongoing with BP at goal.  Recommend he monitor BP at least a few mornings a week at home and document.  DASH diet at home.  Continue current medication regimen and adjust as needed.  Labs today: BMP and TSH.  Continue Accupril for kidney protection. Return in 3 months to meet new PCP.

## 2020-04-28 NOTE — Patient Instructions (Addendum)
[facility] -- [phone] [address] [place], [place] [address]  Diabetes Mellitus and Nutrition, Adult When you have diabetes, or diabetes mellitus, it is very important to have healthy eating habits because your blood sugar (glucose) levels are greatly affected by what you eat and drink. Eating healthy foods in the right amounts, at about the same times every day, can help you:  Control your blood glucose.  Lower your risk of heart disease.  Improve your blood pressure.  Reach or maintain a healthy weight. What can affect my meal plan? Every person with diabetes is different, and each person has different needs for a meal plan. Your health care provider may recommend that you work with a dietitian to make a meal plan that is best for you. Your meal plan may vary depending on factors such as:  The calories you need.  The medicines you take.  Your weight.  Your blood glucose, blood pressure, and cholesterol levels.  Your activity level.  Other health conditions you have, such as heart or kidney disease. How do carbohydrates affect me? Carbohydrates, also called carbs, affect your blood glucose level more than any other type of food. Eating carbs naturally raises the amount of glucose in your blood. Carb counting is a method for keeping track of how many carbs you eat. Counting carbs is important to keep your blood glucose at a healthy level, especially if you use insulin or take certain oral diabetes medicines. It is important to know how many carbs you can safely have in each meal. This is different for every person. Your dietitian can help you calculate how many carbs you should have at each meal and for each snack. How does alcohol affect me? Alcohol can cause a sudden decrease in blood glucose (hypoglycemia), especially if you use insulin or take certain oral diabetes medicines. Hypoglycemia can be a life-threatening condition. Symptoms of hypoglycemia, such  as sleepiness, dizziness, and confusion, are similar to symptoms of having too much alcohol.  Do not drink alcohol if: ? Your health care provider tells you not to drink. ? You are pregnant, may be pregnant, or are planning to become pregnant.  If you drink alcohol: ? Do not drink on an empty stomach. ? Limit how much you use to:  0-1 drink a day for women.  0-2 drinks a day for men. ? Be aware of how much alcohol is in your drink. In the [place] one drink equals one 12 oz bottle of beer (355 mL), one 5 oz glass of wine (148 mL), or one 1 oz glass of hard liquor (44 mL). ? Keep yourself hydrated with water, diet soda, or unsweetened iced tea.  Keep in mind that regular soda, juice, and other mixers may contain a lot of sugar and must be counted as carbs. What are tips for following this plan? Reading food labels  Start by checking the serving size on the "Nutrition Facts" label of packaged foods and drinks. The amount of calories, carbs, fats, and other nutrients listed on the label is based on one serving of the item. Many items contain more than one serving per package.  Check the total grams (g) of carbs in one serving. You can calculate the number of servings of carbs in one serving by dividing the total carbs by 15. For example, if a food has 30 g of total carbs per serving, it would be equal to 2 servings of carbs.  Check the number of grams (g)  of saturated fats and trans fats in one serving. Choose foods that have a low amount or none of these fats.  Check the number of milligrams (mg) of salt (sodium) in one serving. Most people should limit total sodium intake to less than 2,300 mg per day.  Always check the nutrition information of foods labeled as "low-fat" or "nonfat." These foods may be higher in added sugar or refined carbs and should be avoided.  Talk to your dietitian to identify your daily goals for nutrients listed on the label. Shopping  Avoid buying canned,  pre-made, or processed foods. These foods tend to be high in fat, sodium, and added sugar.  Shop around the outside edge of the grocery store. This is where you will most often find fresh fruits and vegetables, bulk grains, fresh meats, and fresh dairy. Cooking  Use low-heat cooking methods, such as baking, instead of high-heat cooking methods like deep frying.  Cook using healthy oils, such as olive, canola, or sunflower oil.  Avoid cooking with butter, cream, or high-fat meats. Meal planning  Eat meals and snacks regularly, preferably at the same times every day. Avoid going long periods of time without eating.  Eat foods that are high in fiber, such as fresh fruits, vegetables, beans, and whole grains. Talk with your dietitian about how many servings of carbs you can eat at each meal.  Eat 4-6 oz (112-168 g) of lean protein each day, such as lean meat, chicken, fish, eggs, or tofu. One ounce (oz) of lean protein is equal to: ? 1 oz (28 g) of meat, chicken, or fish. ? 1 egg. ?  cup (62 g) of tofu.  Eat some foods each day that contain healthy fats, such as avocado, nuts, seeds, and fish.   What foods should I eat? Fruits Berries. Apples. Oranges. Peaches. Apricots. Plums. Grapes. Mango. Papaya. Pomegranate. Kiwi. Cherries. Vegetables Lettuce. Spinach. Leafy greens, including kale, chard, collard greens, and mustard greens. Beets. Cauliflower. Cabbage. Broccoli. Carrots. Green beans. Tomatoes. Peppers. Onions. Cucumbers. Brussels sprouts. Grains Whole grains, such as whole-wheat or whole-grain bread, crackers, tortillas, cereal, and pasta. Unsweetened oatmeal. Quinoa. Brown or wild rice. Meats and other proteins Seafood. Poultry without skin. Lean cuts of poultry and beef. Tofu. Nuts. Seeds. Dairy Low-fat or fat-free dairy products such as milk, yogurt, and cheese. The items listed above may not be a complete list of foods and beverages you can eat. Contact a dietitian for more  information. What foods should I avoid? Fruits Fruits canned with syrup. Vegetables Canned vegetables. Frozen vegetables with butter or cream sauce. Grains Refined white flour and flour products such as bread, pasta, snack foods, and cereals. Avoid all processed foods. Meats and other proteins Fatty cuts of meat. Poultry with skin. Breaded or fried meats. Processed meat. Avoid saturated fats. Dairy Full-fat yogurt, cheese, or milk. Beverages Sweetened drinks, such as soda or iced tea. The items listed above may not be a complete list of foods and beverages you should avoid. Contact a dietitian for more information. Questions to ask a health care provider  Do I need to meet with a diabetes educator?  Do I need to meet with a dietitian?  What number can I call if I have questions?  When are the best times to check my blood glucose? Where to find more information:  American Diabetes Association: diabetes.org  Academy of Nutrition and Dietetics: www.eatright.[employer] of Diabetes and Digestive and Kidney Diseases: [employer]  Association of Diabetes Care  and Education Specialists: www.diabeteseducator.org Summary  It is important to have healthy eating habits because your blood sugar (glucose) levels are greatly affected by what you eat and drink.  A healthy meal plan will help you control your blood glucose and maintain a healthy lifestyle.  Your health care provider may recommend that you work with a dietitian to make a meal plan that is best for you.  Keep in mind that carbohydrates (carbs) and alcohol have immediate effects on your blood glucose levels. It is important to count carbs and to use alcohol carefully. This information is not intended to replace advice given to you by your health care provider. Make sure you discuss any questions you have with your health care provider. Document Revised: 03/13/2019 Document Reviewed: 03/13/2019 Elsevier  Patient Education  2021 [employer].

## 2020-04-28 NOTE — Assessment & Plan Note (Addendum)
Chronic, ongoing with A1c trending up today to 7.2% and urine ALB 30.  Continue current medication regimen at this time and consider trial of GLP1, like Trulicity, next visit if ongoing elevations >7%.  He can not tolerate increased Metformin due to GI symptoms and had urine infections with SGLT2.  Would avoid Glipizide due to his age and having hypoglycemia episodes with this in past.  Educated him on GLP1 today and he is aware that would be a beneficial next step if unable to get A1c below goal with current Metformin dosing and diet changes at home.  Recommend he monitor BS daily and document for next visit + focus heavily on diet changes (less potatoes, which are his favorite).  Continue Accupril for kidney protection.  Return in 3 months.

## 2020-04-29 ENCOUNTER — Encounter: Payer: Self-pay | Admitting: Nurse Practitioner

## 2020-04-29 DIAGNOSIS — E538 Deficiency of other specified B group vitamins: Secondary | ICD-10-CM | POA: Insufficient documentation

## 2020-04-29 LAB — TSH: TSH: 4.22 u[IU]/mL (ref 0.450–4.500)

## 2020-04-29 LAB — BASIC METABOLIC PANEL
BUN/Creatinine Ratio: 11 (ref 10–24)
BUN: 15 mg/dL (ref 8–27)
CO2: 23 mmol/L (ref 20–29)
Calcium: 9 mg/dL (ref 8.6–10.2)
Chloride: 102 mmol/L (ref 96–106)
Creatinine, Ser: 1.4 mg/dL — ABNORMAL HIGH (ref 0.76–1.27)
GFR calc Af Amer: 56 mL/min/{1.73_m2} — ABNORMAL LOW (ref >59)
GFR calc non Af Amer: 48 mL/min/{1.73_m2} — ABNORMAL LOW (ref >59)
Glucose: 165 mg/dL — ABNORMAL HIGH (ref 65–99)
Potassium: 4.2 mmol/L (ref 3.5–5.2)
Sodium: 139 mmol/L (ref 134–144)

## 2020-04-29 LAB — LIPID PANEL W/O CHOL/HDL RATIO
Cholesterol, Total: 141 mg/dL (ref 100–199)
HDL: 22 mg/dL — ABNORMAL LOW (ref >39)
LDL Chol Calc (NIH): 60 mg/dL (ref 0–99)
Triglycerides: 377 mg/dL — ABNORMAL HIGH (ref 0–149)
VLDL Cholesterol Cal: 59 mg/dL — ABNORMAL HIGH (ref 5–40)

## 2020-04-29 LAB — VITAMIN B12: Vitamin B-12: 77 pg/mL — ABNORMAL LOW (ref 232–1245)

## 2020-04-29 NOTE — Progress Notes (Signed)
Contacted via MyChart   Good evening Duane Hill, your labs have returned: - Cholesterol levels show LDL at goal, but triglycerides above goal.  Continue Atorvastatin daily and reduce saturated fats and sugars in diet.  We may add another medication in future to lower triglycerides. - Thyroid lab is normal - Kidney function shows baseline kidney disease which we will continue to monitor and if any worsening will send you to a kidney specialist.  Continue Accupril which is kidney protective.   - Vitamin B12 level is very low at 77, we like this above 300.  This can explain some of your nerve discomfort in feet.  I would recommend starting Vitamin B12 1000 MCG daily.  You can obtain this in the vitamin section at any location and the store brand is fine, often cheaper.  We will recheck next visit and if level not improving may have to consider monthly B12 shots for a little while to get levels stable.  Any questions? Keep being awesome!!  Thank you for allowing me to participate in your care. Kindest regards, Jeremias Broyhill

## 2020-05-08 ENCOUNTER — Other Ambulatory Visit: Payer: Self-pay | Admitting: Family Medicine

## 2020-05-08 DIAGNOSIS — I10 Essential (primary) hypertension: Secondary | ICD-10-CM

## 2020-05-08 NOTE — Telephone Encounter (Signed)
Routing to provider  

## 2020-05-08 NOTE — Telephone Encounter (Signed)
Requested medication (s) are due for refill today - no  Requested medication (s) are on the active medication list -no  Future visit scheduled -no  Last refill: not on current medication list  Notes to clinic: Request medication not current- sent for review of request  Requested Prescriptions  Pending Prescriptions Disp Refills   cetirizine (ZYRTEC) 10 MG tablet [Pharmacy Med Name: CETIRIZINE HCL 10 MG TABLET] 30 tablet 0    Sig: Take 1 tablet (10 mg total) by mouth daily.      Ear, Nose, and Throat:  Antihistamines Passed - 05/08/2020 12:13 PM      Passed - Valid encounter within last 12 months    Recent Outpatient Visits           1 week ago Type 2 diabetes mellitus with hyperglycemia, without long-term current use of insulin ([place])   [place], Jolene T, NP   7 months ago Type 2 diabetes mellitus with hyperglycemia, without long-term current use of insulin ([place])   [facility], Megan P, DO   10 months ago Type 2 diabetes mellitus with hyperglycemia, without long-term current use of insulin [facility])   [facility] [name], [place]   12 months ago [place], [place], [place]   1 year ago Type 2 diabetes mellitus without complication, without long-term current use of insulin [facility])   [facility], [place], [place]                    Requested Prescriptions  Pending Prescriptions Disp Refills   cetirizine (ZYRTEC) 10 MG tablet [Pharmacy Med Name: CETIRIZINE HCL 10 MG TABLET] 30 tablet 0    Sig: Take 1 tablet (10 mg total) by mouth daily.      Ear, Nose, and Throat:  Antihistamines Passed - 05/08/2020 12:13 PM      Passed - Valid encounter within last 12 months    Recent Outpatient Visits           1 week ago Type 2 diabetes mellitus with hyperglycemia, without long-term current use of insulin ([place])   [place], Jolene T, NP    7 months ago Type 2 diabetes mellitus with hyperglycemia, without long-term current use of insulin ([place])   [facility], Megan P, DO   10 months ago Type 2 diabetes mellitus with hyperglycemia, without long-term current use of insulin [facility])   [facility] [name], [place]   12 months ago [place], [place], [place]   1 year ago Type 2 diabetes mellitus without complication, without long-term current use of insulin [facility])   [place], [place], [place]

## 2020-05-27 ENCOUNTER — Other Ambulatory Visit: Payer: Self-pay

## 2020-05-27 DIAGNOSIS — E785 Hyperlipidemia, unspecified: Secondary | ICD-10-CM

## 2020-05-27 NOTE — Telephone Encounter (Signed)
Patient last seen 04/28/20

## 2020-05-28 MED ORDER — ATORVASTATIN CALCIUM 20 MG PO TABS: 20.0000 mg | ORAL_TABLET | Freq: Every day | ORAL | 1 refills | Status: DC

## 2020-06-02 ENCOUNTER — Other Ambulatory Visit: Payer: Self-pay | Admitting: Family Medicine

## 2020-06-02 DIAGNOSIS — I10 Essential (primary) hypertension: Secondary | ICD-10-CM

## 2020-06-05 ENCOUNTER — Other Ambulatory Visit: Payer: Self-pay | Admitting: Family Medicine

## 2020-06-05 DIAGNOSIS — I10 Essential (primary) hypertension: Secondary | ICD-10-CM

## 2020-06-11 ENCOUNTER — Telehealth: Payer: Self-pay

## 2020-06-11 ENCOUNTER — Telehealth: Payer: Self-pay | Admitting: General Practice

## 2020-06-11 NOTE — Telephone Encounter (Signed)
  Chronic Care Management   Outreach Note  06/11/2020 Name: Duane Hill. MRN: [id number] DOB: Dec 24, 1940  Referred by: Jon Billings, NP Reason for referral : Appointment (RNCM: Follow up call for Chronic Disease Management and Care Coordination Needs )   An unsuccessful telephone outreach was attempted today. The patient was referred to the case management team for assistance with care management and care coordination.   Follow Up Plan: The care management team will reach out to the patient again over the next 30 to 60 days.   Noreene Larsson RN, MSN, [facility] Mobile: [phone]

## 2020-06-24 NOTE — Telephone Encounter (Signed)
Patient has been rescheduled 4/22 ,but states he needs transportation services for appointments is this something you can place a referral for ?

## 2020-06-24 NOTE — Telephone Encounter (Signed)
Please reschedule with RN CM  

## 2020-07-02 ENCOUNTER — Other Ambulatory Visit: Payer: Self-pay | Admitting: Family Medicine

## 2020-07-09 ENCOUNTER — Other Ambulatory Visit: Payer: Self-pay | Admitting: Family Medicine

## 2020-07-09 NOTE — Telephone Encounter (Signed)
Requested Prescriptions  Pending Prescriptions Disp Refills  . metFORMIN (GLUCOPHAGE-XR) 500 MG 24 hr tablet [Pharmacy Med Name: METFORMIN HCL ER 500 MG TABLET] 180 tablet 0    Sig: Take 1 tablet (500 mg total) by mouth in the morning and at bedtime.     Endocrinology:  Diabetes - Biguanides Failed - 07/09/2020 10:36 AM      Failed - Cr in normal range and within 360 days    Creatinine  Date Value Ref Range Status  08/16/2014 1.11 mg/dL Final    Comment:    0.61-1.24 NOTE: New Reference Range  06/25/14    Creatinine, Ser  Date Value Ref Range Status  04/28/2020 1.40 (H) 0.76 - 1.27 mg/dL Final         Failed - eGFR in normal range and within 360 days    EGFR (African American)  Date Value Ref Range Status  08/16/2014 >60  Final   GFR calc Af Amer  Date Value Ref Range Status  04/28/2020 56 (L) >59 mL/min/1.73 Final    Comment:    **In accordance with recommendations from the NKF-ASN Task force,**   [facility] is in the process of updating its eGFR calculation to the   2021 CKD-EPI creatinine equation that estimates kidney function   without a race variable.    EGFR (Non-African Amer.)  Date Value Ref Range Status  08/16/2014 >60  Final    Comment:    eGFR values <21mL/min/1.73 m2 may be an indication of chronic kidney disease (CKD). Calculated eGFR is useful in patients with stable renal function. The eGFR calculation will not be reliable in acutely ill patients when serum creatinine is changing rapidly. It is not useful in patients on dialysis. The eGFR calculation may not be applicable to patients at the low and high extremes of body sizes, pregnant women, and vegetarians.    GFR calc non Af Amer  Date Value Ref Range Status  04/28/2020 48 (L) >59 mL/min/1.73 Final         Passed - HBA1C is between 0 and 7.9 and within 180 days    Hemoglobin A1C  Date Value Ref Range Status  08/15/2014 6.7 (H) % Final    Comment:    4.0-6.0 NOTE: New Reference Range   06/25/14    HB A1C (BAYER DCA - WAIVED)  Date Value Ref Range Status  04/28/2020 7.2 (H) <7.0 % Final    Comment:                                          Diabetic Adult            <7.0                                       Healthy Adult        4.3 - 5.7                                                           (DCCT/NGSP) American Diabetes Association's Summary of Glycemic Recommendations for Adults with Diabetes: Hemoglobin A1c <7.0%. More stringent glycemic goals (A1c <  6.0%) may further reduce complications at the cost of increased risk of hypoglycemia.          Passed - Valid encounter within last 6 months    Recent Outpatient Visits          2 months ago Type 2 diabetes mellitus with hyperglycemia, without long-term current use of insulin ([place])   [place], Jolene T, NP   9 months ago Type 2 diabetes mellitus with hyperglycemia, without long-term current use of insulin ([place])   [place], Megan P, DO   1 year ago Type 2 diabetes mellitus with hyperglycemia, without long-term current use of insulin [facility])   [facility] [name], [place]   1 year ago [place], Rachel [place], [place]   1 year ago Type 2 diabetes mellitus without complication, without long-term current use of insulin [facility])   [place], [place], [place]

## 2020-08-01 ENCOUNTER — Other Ambulatory Visit: Payer: Self-pay | Admitting: Family Medicine

## 2020-08-01 ENCOUNTER — Other Ambulatory Visit: Payer: Self-pay | Admitting: Nurse Practitioner

## 2020-08-01 NOTE — Telephone Encounter (Signed)
Requested Prescriptions  Pending Prescriptions Disp Refills  . Microlet Lancets MISC [Pharmacy Med Name: MICROLET LANCETS] 100 each 0    Sig: USE TWICE DAILY AS DIRECTED.     Endocrinology: Diabetes - Testing Supplies Passed - 08/01/2020 10:44 AM      Passed - Valid encounter within last 12 months    Recent Outpatient Visits          3 months ago Type 2 diabetes mellitus with hyperglycemia, without long-term current use of insulin ([place])   [place], Jolene T, NP   10 months ago Type 2 diabetes mellitus with hyperglycemia, without long-term current use of insulin ([place])   [place], Megan P, DO   1 year ago Type 2 diabetes mellitus with hyperglycemia, without long-term current use of insulin [facility])   [facility] [name], [place]   1 year ago [place], Rachel [place], [place]   1 year ago Type 2 diabetes mellitus without complication, without long-term current use of insulin [facility])   [place], [place], [place]

## 2020-08-08 ENCOUNTER — Ambulatory Visit (INDEPENDENT_AMBULATORY_CARE_PROVIDER_SITE_OTHER): Payer: Medicare Other | Admitting: General Practice

## 2020-08-08 ENCOUNTER — Telehealth: Payer: Medicare Other | Admitting: General Practice

## 2020-08-08 DIAGNOSIS — E1159 Type 2 diabetes mellitus with other circulatory complications: Secondary | ICD-10-CM

## 2020-08-08 DIAGNOSIS — E1165 Type 2 diabetes mellitus with hyperglycemia: Secondary | ICD-10-CM

## 2020-08-08 DIAGNOSIS — W19XXXA Unspecified fall, initial encounter: Secondary | ICD-10-CM

## 2020-08-08 DIAGNOSIS — E785 Hyperlipidemia, unspecified: Secondary | ICD-10-CM | POA: Diagnosis not present

## 2020-08-08 DIAGNOSIS — I152 Hypertension secondary to endocrine disorders: Secondary | ICD-10-CM | POA: Diagnosis not present

## 2020-08-08 DIAGNOSIS — E1169 Type 2 diabetes mellitus with other specified complication: Secondary | ICD-10-CM

## 2020-08-08 DIAGNOSIS — Z7189 Other specified counseling: Secondary | ICD-10-CM
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
## 2020-08-11 ENCOUNTER — Telehealth: Payer: Self-pay | Admitting: *Deleted

## 2020-08-11 NOTE — Telephone Encounter (Signed)
   Telephone encounter was:  Successful.  08/11/2020 Name: Duane Hill. MRN: [id number] DOB: Mar 26, 1941  Duane Hill. is a 80 y.o. year old male who is a primary care patient of Jon Billings, NP . The community resource team was consulted for assistance with Transportation Needs , Food Insecurity and Financial Difficulties related to utilities  Care guide performed the following interventions: Patient provided with information about care guide support team and interviewed to confirm resource needs Discussed resources to assist with utilities Follow up call placed to community resources to determine status of patients referral.  Follow Up Plan:  Care guide will follow up with patient by phone over the next days  [place], Care Management  [phone] [address] , [place] [address] Email : [email or web address]

## 2020-08-14 ENCOUNTER — Telehealth: Payer: Self-pay | Admitting: *Deleted

## 2020-08-14 NOTE — Telephone Encounter (Signed)
   Telephone encounter was:  Unsuccessful.  08/14/2020 Name: Duane Hill. MRN: [id number] DOB: Jul 02, 1940  Unsuccessful outbound call made today to assist with:  Transportation Needs , Food Insecurity and Financial Difficulties related to gas  Outreach Attempt:  2nd Attempt  Phone showing disconnected    [place] , [place], Care Management  [phone] [address] , [place] [address] Email : [email or web address]

## 2020-08-15 ENCOUNTER — Telehealth: Payer: Self-pay | Admitting: *Deleted

## 2020-08-15 NOTE — Telephone Encounter (Signed)
.    Telephone encounter was:  Successful.  08/15/2020 Name: Duane Hill. MRN: [id number] DOB: [birth year]  Duane Hill. is a 80 y.o. year old male who is a primary care patient of Jon Billings, NP . The community resource team was consulted for assistance with Transportation Needs , Food Insecurity, Home Modifications and utiliities  Care guide performed the following interventions: Patient provided with information about care guide support team and interviewed to confirm resource needs.  Follow Up Plan:  Care guide will follow up with patient by phone over the next [place], Care Management  [phone] [address] , [place] [address] Email : [email or web address] [email or web address]

## 2020-08-15 NOTE — Telephone Encounter (Signed)
   Telephone encounter was:  Successful.  08/15/2020 Name: Duane Hill. MRN: [id number] DOB: [birth year]  Duane Hill. is a 80 y.o. year old male who is a primary care patient of Jon Billings, NP . The community resource team was consulted for assistance with Transportation Needs , Food Insecurity, Home Modifications and Ultilities  Care guide performed the following interventions: Patient provided with information about care guide support team and interviewed to confirm resource needs Discussed resources to assist with his many needs .  Follow Up Plan: has started meals on wheels said was good and hot , talked aboout transportation for dr and other out appts etc will extend to him all public transport and medical opportunities, going to complete the application for [place], Care Management  [phone] [address] , [place] [address] Email : [email or web address] [email or web address]

## 2020-08-22 ENCOUNTER — Telehealth: Payer: Self-pay | Admitting: Licensed Clinical Social Worker

## 2020-08-22 NOTE — Telephone Encounter (Signed)
    Clinical Social Work  Chronic Care Management   Phone Outreach    08/22/2020 Name: Duane Hill. MRN: [id number] DOB: 1940-06-24  Duane Hill. is a 80 y.o. year old male who is a primary care patient of Jon Billings, NP .   CCM LCSW reached out to patient today by phone to introduce self, assess needs and offer Care Management services and interventions.    Telephone outreach was unsuccessful  Plan:CCM LCSW will wait for return call. If no return call is received, Will reach out to patient again in the next 7 days .   Review of patient status, including review of consultants reports, relevant laboratory and other test results, and collaboration with appropriate care team members and the patient's provider was performed as part of comprehensive patient evaluation and provision of care management services.    Christa See, MSW, [email or web address] Phone [phone] 11:48 AM

## 2020-08-27 ENCOUNTER — Telehealth: Payer: Self-pay | Admitting: *Deleted

## 2020-08-27 NOTE — Telephone Encounter (Signed)
   Telephone encounter was:  Unsuccessful.  08/27/2020 Name: [name]. MRN: [id number] DOB: 01-13-1941  Unsuccessful outbound call made today to assist with:  Transportation Needs , Food Insecurity and Financial Difficulties related to utilities  Outreach Attempt:  1st Attempt   mailbox full.  [place], Care Management  [phone] [address] , [place] [address] Email : [email or web address] [email or web address]

## 2020-08-28 ENCOUNTER — Telehealth: Payer: Self-pay | Admitting: *Deleted

## 2020-08-28 NOTE — Telephone Encounter (Signed)
   Telephone encounter was:  Successful.  08/28/2020 Name: Duane Hill. MRN: [id number] DOB: Oct 02, 1940  Duane Hill. is a 80 y.o. year old male who is a primary care patient of Jon Billings, NP . The community resource team was consulted for assistance with Transportation Needs , Food Insecurity and Financial Difficulties related to utilities  Care guide performed the following interventions: Follow up call placed to community resources to determine status of patients referral.  Follow Up Plan:  Care guide will follow up with patient by phone over the next Will  get niece to send bills to me for [place], Care Management  [phone] [address] , [place] [address] Email : Ashby Dawes. [email or web address]

## 2020-08-28 NOTE — Telephone Encounter (Signed)
   Telephone encounter was:  Unsuccessful.  08/28/2020 Name: Duane Hill. MRN: [id number] DOB: 1941/04/04  Unsuccessful outbound call made today to assist with:  Transportation Needs , Food Insecurity and Financial Difficulties related to utility  Outreach Attempt:  2nd Attempt  A HIPAA compliant voice message was left requesting a return call.  Instructed patient to call back at   Instructed patient to call back at [phone]  at their earliest convenience.   [place], Care Management  [phone] [address] , [place] [address] Email : [email or web address] [email or web address]

## 2020-09-01 ENCOUNTER — Other Ambulatory Visit: Payer: Self-pay | Admitting: Family Medicine

## 2020-09-01 ENCOUNTER — Telehealth: Payer: Self-pay | Admitting: *Deleted

## 2020-09-01 DIAGNOSIS — I10 Essential (primary) hypertension: Secondary | ICD-10-CM

## 2020-09-01 NOTE — Telephone Encounter (Signed)
   Telephone encounter was:  Unsuccessful.  09/01/2020 Name: [name]. MRN: [id number] DOB: 11-06-1940  Unsuccessful outbound call made today to assist with:  Transportation Needs , Food Insecurity and Financial Difficulties related to rent [employer] Attempt:  3rd Attempt.  Referral closed unable to contact patient.   No voicemail available   [place], Care Management  [phone] [address] , [place] [address] Email : [email or web address] [email or web address]

## 2020-09-01 NOTE — Telephone Encounter (Signed)
Requested Prescriptions  Pending Prescriptions Disp Refills  . carvedilol (COREG) 25 MG tablet [Pharmacy Med Name: CARVEDILOL 25 MG TABLET] 180 tablet 0    Sig: Take 1 tablet (25 mg total) by mouth 2 (two) times daily with a meal.     Cardiovascular:  Beta Blockers Passed - 09/01/2020  9:44 AM      Passed - Last BP in normal range    BP Readings from Last 1 Encounters:  04/28/20 128/76         Passed - Last Heart Rate in normal range    Pulse Readings from Last 1 Encounters:  04/28/20 (!) 62         Passed - Valid encounter within last 6 months    Recent Outpatient Visits          4 months ago Type 2 diabetes mellitus with hyperglycemia, without long-term current use of insulin ([place])   [place], Milford T, NP   11 months ago Type 2 diabetes mellitus with hyperglycemia, without long-term current use of insulin ([place])   [place], Megan P, DO   1 year ago Type 2 diabetes mellitus with hyperglycemia, without long-term current use of insulin ([facility])   [place], Lilia Argue, [place]   1 year ago [place], [place], [place]   1 year ago Type 2 diabetes mellitus without complication, without long-term current use of insulin [facility])   [facility], [place], [place]             . quinapril (ACCUPRIL) 40 MG tablet [Pharmacy Med Name: QUINAPRIL 40 MG TABLET] 90 tablet 0    Sig: Take 1 tablet (40 mg total) by mouth daily.     Cardiovascular:  ACE Inhibitors Failed - 09/01/2020  9:44 AM      Failed - Cr in normal range and within 180 days    Creatinine  Date Value Ref Range Status  08/16/2014 1.11 mg/dL Final    Comment:    0.61-1.24 NOTE: New Reference Range  06/25/14    Creatinine, Ser  Date Value Ref Range Status  04/28/2020 1.40 (H) 0.76 - 1.27 mg/dL Final         Passed - K in normal range and within 180 days    Potassium  Date Value Ref Range  Status  04/28/2020 4.2 3.5 - 5.2 mmol/L Final  08/16/2014 3.5 mmol/L Final    Comment:    3.5-5.1 NOTE: New Reference Range  06/25/14          Passed - Patient is not pregnant      Passed - Last BP in normal range    BP Readings from Last 1 Encounters:  04/28/20 128/76         Passed - Valid encounter within last 6 months    Recent Outpatient Visits          4 months ago Type 2 diabetes mellitus with hyperglycemia, without long-term current use of insulin ([place])   [place], Jolene T, NP   11 months ago Type 2 diabetes mellitus with hyperglycemia, without long-term current use of insulin ([place])   [place], Megan P, DO   1 year ago Type 2 diabetes mellitus with hyperglycemia, without long-term current use of insulin [facility])   [place], Lilia Argue, [place]   1 year ago [place], [place],  PA-C   1 year ago Type 2 diabetes mellitus without complication, without long-term current use of insulin [facility])   [place], [place], [place]

## 2020-09-09 ENCOUNTER — Other Ambulatory Visit: Payer: Self-pay | Admitting: Family Medicine

## 2020-09-16 ENCOUNTER — Telehealth: Payer: Self-pay

## 2020-09-16 ENCOUNTER — Telehealth: Payer: Self-pay | Admitting: General Practice

## 2020-09-16 NOTE — Telephone Encounter (Signed)
  Care Management   Follow Up Note   09/16/2020 Name: Duane Hill. MRN: [id number] DOB: [birth year]   Referred by: Jon Billings, NP Reason for referral : Chronic Care Management (RNCM: Follow up for Chronic Disease management and Care Coordination Needs )   An unsuccessful telephone outreach was attempted today. The patient was referred to the case management team for assistance with care management and care coordination.   Follow Up Plan: The care management team will reach out to the patient again over the next 30 to 60 days.   Noreene Larsson RN, MSN, [facility] Mobile: [phone]

## 2020-09-24 ENCOUNTER — Ambulatory Visit (INDEPENDENT_AMBULATORY_CARE_PROVIDER_SITE_OTHER): Payer: Medicare Other

## 2020-09-24 ENCOUNTER — Telehealth: Payer: Medicare Other | Admitting: General Practice

## 2020-09-24 DIAGNOSIS — E1165 Type 2 diabetes mellitus with hyperglycemia: Secondary | ICD-10-CM | POA: Diagnosis not present

## 2020-09-24 DIAGNOSIS — E1159 Type 2 diabetes mellitus with other circulatory complications: Secondary | ICD-10-CM

## 2020-09-24 DIAGNOSIS — I152 Hypertension secondary to endocrine disorders: Secondary | ICD-10-CM | POA: Diagnosis not present

## 2020-09-24 DIAGNOSIS — W19XXXA Unspecified fall, initial encounter: Secondary | ICD-10-CM
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
## 2020-09-24 NOTE — Patient Instructions (Signed)
Visit Information  PATIENT GOALS: Goals Addressed            This Visit's Progress   . RNCM - Glycemic Management Optimized   On track    Timeframe:  Long-Range Goal Priority:  Medium Start Date:                             Expected End Date: Follow Up Date: 12-24-2020  Patient Goals:  - check blood sugar at prescribed times - check blood sugar before and after exercise - check blood sugar if I feel it is too high or too low - enter blood sugar readings and medication or insulin into daily log - take the blood sugar log to all doctor visits  - change to whole grain breads, cereal, pasta - drink 6 to 8 glasses of water each day - eat fish at least once per week - fill half of plate with vegetables - limit fast food meals to no more than 1 per week - manage portion size - prepare main meal at home 3 to 5 days each week - read food labels for fat, fiber, carbohydrates and portion size - reduce red meat to 2 to 3 times a week - switch to low-fat or skim milk - switch to sugar-free drinks  - check feet daily for cuts, sores or redness - keep feet up while sitting - trim toenails straight across - wash and dry feet carefully every day - wear comfortable, cotton socks - wear comfortable, well-fitting shoes  - barriers to adherence to treatment plan identified - blood glucose monitoring encouraged - blood glucose readings reviewed - resources required to improve adherence to care identified - self-awareness of signs/symptoms of hypo or hyperglycemia encouraged - use of blood glucose monitoring log promoted    . RNCM - Hypertension Monitored & Managed   On track    Timeframe:  Long-Range Goal Priority:  Medium Start Date:                             Expected End Date:      Follow Up Date: 12-24-2020                    Patient Goals: - check blood pressure weekly- currently blood pressure machine is unable to function. - choose a place to take my blood pressure (home, clinic  or office, retail store) - write blood pressure results in a log or diary - agree on reward when goals are met - agree to work together to make changes - ask questions to understand - have a family meeting to talk about healthy habits - learn about high blood pressure - blood pressure trends reviewed - home or ambulatory blood pressure monitoring encouraged    . RNCM: Prevent Falls and Injury   On track    Follow Up Date 9-7--2022   - add more outdoor lighting - always use handrails on the stairs - always wear low-heeled or flat shoes or slippers with nonskid soles - call the doctor if I am feeling too drowsy - install bathroom grab bars - keep a flashlight by the bed - keep my cell phone with me always - learn how to get back up if I fall - make an emergency alert plan in case I fall - pick up clutter from the floors - use a nonslip pad with  throw rugs, or remove them completely - use a cane or walker - use a nightlight in the bathroom    Why is this important?    Most falls happen when it is hard for you to walk safely. Your balance may be off because of an illness. You may have pain in your knees, hip or other joints.   You may be overly tired or taking medicines that make you sleepy. You may not be able to see or hear clearly.   Falls can lead to broken bones, bruises or other injuries.   There are things you can do to help prevent falling.     Notes: Fall x 2 weeks ago when walking home from the store. Knees hit the sidewalk and tore his jeans. 09/24/2020: No recent falls.  Patient states he does get dizzy when he moves too fast and his legs "won't hold him up" at times.  Patient being extra cautious when walking.       Patient verbalizes understanding of instructions provided today and agrees to view in [place].   Telephone follow up appointment with care management team member scheduled for: 12-24-2020 at 2:30 pm  Salvatore Marvel RN, [facility] Mobile: [phone]

## 2020-09-25 ENCOUNTER — Other Ambulatory Visit: Payer: Self-pay | Admitting: Family Medicine

## 2020-09-25 ENCOUNTER — Telehealth: Payer: Self-pay | Admitting: *Deleted

## 2020-09-25 NOTE — Telephone Encounter (Signed)
Appt required.

## 2020-09-25 NOTE — Telephone Encounter (Signed)
   Notes to clinic: script was last filled on 05/08/2020 for 90  Patient should have been out 07/2020 Review for continued use    Requested Prescriptions  Pending Prescriptions Disp Refills   hydrochlorothiazide (HYDRODIURIL) 25 MG tablet [Pharmacy Med Name: HYDROCHLOROTHIAZIDE 25 MG TAB] 90 tablet 0    Sig: Take 1 tablet (25 mg total) by mouth daily.      Cardiovascular: Diuretics - Thiazide Failed - 09/25/2020 11:30 AM      Failed - Cr in normal range and within 360 days    Creatinine  Date Value Ref Range Status  08/16/2014 1.11 mg/dL Final    Comment:    0.61-1.24 NOTE: New Reference Range  06/25/14    Creatinine, Ser  Date Value Ref Range Status  04/28/2020 1.40 (H) 0.76 - 1.27 mg/dL Final          Passed - Ca in normal range and within 360 days    Calcium  Date Value Ref Range Status  04/28/2020 9.0 8.6 - 10.2 mg/dL Final   Calcium, Total  Date Value Ref Range Status  08/16/2014 8.9 mg/dL Final    Comment:    8.9-10.3 NOTE: New Reference Range  06/25/14           Passed - K in normal range and within 360 days    Potassium  Date Value Ref Range Status  04/28/2020 4.2 3.5 - 5.2 mmol/L Final  08/16/2014 3.5 mmol/L Final    Comment:    3.5-5.1 NOTE: New Reference Range  06/25/14           Passed - Na in normal range and within 360 days    Sodium  Date Value Ref Range Status  04/28/2020 139 134 - 144 mmol/L Final  08/16/2014 140 mmol/L Final    Comment:    135-145 NOTE: New Reference Range  06/25/14           Passed - Last BP in normal range    BP Readings from Last 1 Encounters:  04/28/20 128/76          Passed - Valid encounter within last 6 months    Recent Outpatient Visits           5 months ago Type 2 diabetes mellitus with hyperglycemia, without long-term current use of insulin ([place])   [place], Jolene T, NP   12 months ago Type 2 diabetes mellitus with hyperglycemia, without long-term current use of  insulin ([place])   [place], Megan P, DO   1 year ago Type 2 diabetes mellitus with hyperglycemia, without long-term current use of insulin [facility])   [facility] [name], [place]   1 year ago [place], [place], [place]   1 year ago Type 2 diabetes mellitus without complication, without long-term current use of insulin [facility])   [place], [place], [place]

## 2020-09-25 NOTE — Telephone Encounter (Signed)
Scheduled 6/17 states that he has 14 pills left

## 2020-09-25 NOTE — Telephone Encounter (Signed)
   Telephone encounter was:  Successful.  09/25/2020 Name: Duane Hill. MRN: [id number] DOB: [birth year]  Duane Hill. is a 80 y.o. year old male who is a primary care patient of Jon Billings, NP . The community resource team was consulted for assistance with Transportation Needs  patient informed about calling when ready to get transportation  to [place] guide performed the following interventions: Patient provided with information about care guide support team and interviewed to confirm resource needs.  Follow Up Plan:  No further follow up planned at this time. The patient has been provided with needed resources.  [place], Care Management  [phone] [address] , [place] [address] Email : [email or web address]

## 2020-10-03 ENCOUNTER — Encounter: Payer: Self-pay | Admitting: Nurse Practitioner

## 2020-10-03 ENCOUNTER — Ambulatory Visit (INDEPENDENT_AMBULATORY_CARE_PROVIDER_SITE_OTHER): Payer: Medicare Other | Admitting: Nurse Practitioner

## 2020-10-03 ENCOUNTER — Other Ambulatory Visit: Payer: Self-pay

## 2020-10-03 VITALS — BP 148/70 | HR 52 | Wt 167.4 lb

## 2020-10-03 DIAGNOSIS — E1159 Type 2 diabetes mellitus with other circulatory complications: Secondary | ICD-10-CM

## 2020-10-03 DIAGNOSIS — I152 Hypertension secondary to endocrine disorders: Secondary | ICD-10-CM

## 2020-10-03 DIAGNOSIS — E1169 Type 2 diabetes mellitus with other specified complication: Secondary | ICD-10-CM | POA: Diagnosis not present

## 2020-10-03 DIAGNOSIS — E785 Hyperlipidemia, unspecified: Secondary | ICD-10-CM | POA: Diagnosis not present

## 2020-10-03 DIAGNOSIS — E1165 Type 2 diabetes mellitus with hyperglycemia: Secondary | ICD-10-CM | POA: Diagnosis not present

## 2020-10-03 NOTE — Progress Notes (Signed)
BP (!) 148/70   Pulse (!) 52   Wt 167 lb 6 oz (75.9 kg)   SpO2 98%   BMI 26.33 kg/m    Subjective:    Patient ID: Duane Hill., male    DOB: [birth year], 80 y.o.   MRN: [id number]  HPI: Duane Hill. is a 80 y.o. male  Chief Complaint  Patient presents with   Hyperlipidemia   Diabetes   Hypertension    HYPERTENSION / [place] Satisfied with current treatment? yes Duration of hypertension: years BP monitoring frequency: weekly BP range: not sure of readings BP medication side effects: no Past BP meds: carvedilol, HCTZ, and quinapril Duration of hyperlipidemia: years Cholesterol medication side effects: no Cholesterol supplements: none, fish oil, niacin, and red yeast rice Past cholesterol medications: atorvastain (lipitor) Medication compliance: excellent compliance Aspirin: no Recent stressors: no Recurrent headaches: no Visual changes: yes- cataracts Palpitations: no Dyspnea: no Chest pain: no Lower extremity edema: no Dizzy/lightheaded: no  DIABETES Hypoglycemic episodes:no Polydipsia/polyuria: no Visual disturbance: yes- cataracts Chest pain: no Paresthesias: no Glucose Monitoring: yes  Accucheck frequency: Daily  Fasting glucose: 140s  Post prandial:  Evening:  Before meals: Taking Insulin?: no  Long acting insulin:  Short acting insulin: Blood Pressure Monitoring: weekly Retinal Examination: Up to Date Foot Exam: Up to Date Diabetic Education: Not Completed Pneumovax: Up to Date Influenza: Up to Date Aspirin: no  Patient states his legs started hurting 3-4 months ago.  Patient states it is all up and down his legs. Patient feels like it is his back that his causing his legs to hurt.  Patient states he feels like his legs are weak.  He was told that he has scoliosis when he was younger and feels like that is contributing.   Relevant past medical, surgical, family and social history reviewed and updated as indicated. Interim  medical history since our last visit reviewed. Allergies and medications reviewed and updated.  Review of Systems  Eyes:  Positive for visual disturbance (secondary to cataracts).  Respiratory:  Negative for chest tightness and shortness of breath.   Cardiovascular:  Negative for chest pain, palpitations and leg swelling.  Endocrine: Negative for polydipsia and polyuria.  Neurological:  Negative for dizziness, light-headedness, numbness and headaches.   Per HPI unless specifically indicated above     Objective:    BP (!) 148/70   Pulse (!) 52   Wt 167 lb 6 oz (75.9 kg)   SpO2 98%   BMI 26.33 kg/m   Wt Readings from Last 3 Encounters:  10/03/20 167 lb 6 oz (75.9 kg)  04/28/20 172 lb (78 kg)  10/01/19 173 lb (78.5 kg)    Physical Exam Vitals and nursing note reviewed.  Constitutional:      General: He is not in acute distress.    Appearance: Normal appearance. He is not ill-appearing, toxic-appearing or diaphoretic.  HENT:     Head: Normocephalic.     Right Ear: External ear normal.     Left Ear: External ear normal.     Nose: Nose normal. No congestion or rhinorrhea.     Mouth/Throat:     Mouth: Mucous membranes are moist.  Eyes:     General:        Right eye: No discharge.        Left eye: No discharge.     Extraocular Movements: Extraocular movements intact.     Conjunctiva/sclera: Conjunctivae normal.     Pupils: Pupils are equal,  round, and reactive to light.  Cardiovascular:     Rate and Rhythm: Normal rate and regular rhythm.     Heart sounds: No murmur heard. Pulmonary:     Effort: Pulmonary effort is normal. No respiratory distress.     Breath sounds: Normal breath sounds. No wheezing, rhonchi or rales.  Abdominal:     General: Abdomen is flat. Bowel sounds are normal.  Musculoskeletal:     Cervical back: Normal range of motion and neck supple.  Skin:    General: Skin is warm and dry.     Capillary Refill: Capillary refill takes less than 2 seconds.   Neurological:     General: No focal deficit present.     Mental Status: He is alert and oriented to person, place, and time.  Psychiatric:        Mood and Affect: Mood normal.        Behavior: Behavior normal.        Thought Content: Thought content normal.        Judgment: Judgment normal.    Results for orders placed or performed in visit on 10/03/20  Comp Met (CMET)  Result Value Ref Range   Glucose 144 (H) 65 - 99 mg/dL   BUN 18 8 - 27 mg/dL   Creatinine, Ser 1.61 (H) 0.76 - 1.27 mg/dL   eGFR 44 (L) >59 mL/min/1.73   BUN/Creatinine Ratio 11 10 - 24   Sodium 137 134 - 144 mmol/L   Potassium 4.3 3.5 - 5.2 mmol/L   Chloride 99 96 - 106 mmol/L   CO2 23 20 - 29 mmol/L   Calcium 9.6 8.6 - 10.2 mg/dL   Total Protein 6.5 6.0 - 8.5 g/dL   Albumin 4.7 3.7 - 4.7 g/dL   Globulin, Total 1.8 1.5 - 4.5 g/dL   Albumin/Globulin Ratio 2.6 (H) 1.2 - 2.2   Bilirubin Total 0.3 0.0 - 1.2 mg/dL   Alkaline Phosphatase 41 (L) 44 - 121 IU/L   AST 21 0 - 40 IU/L   ALT 24 0 - 44 IU/L  Lipid Profile  Result Value Ref Range   Cholesterol, Total 138 100 - 199 mg/dL   Triglycerides 402 (H) 0 - 149 mg/dL   HDL 24 (L) >39 mg/dL   VLDL Cholesterol Cal 61 (H) 5 - 40 mg/dL   LDL Chol Calc (NIH) 53 0 - 99 mg/dL   Chol/HDL Ratio 5.8 (H) 0.0 - 5.0 ratio  HgB A1c  Result Value Ref Range   Hgb A1c MFr Bld 7.5 (H) 4.8 - 5.6 %   Est. average glucose Bld gHb Est-mCnc 169 mg/dL      Assessment & Plan:   Problem List Items Addressed This Visit       Cardiovascular and Mediastinum   Hypertension associated with diabetes ([place]) - Primary    Chronic, ongoing with BP at goal.  Recommend he monitor BP at least a few mornings a week at home and document.  DASH diet at home.  Continue current medication regimen and adjust as needed.  Labs today.       Relevant Orders   Comp Met (CMET) (Completed)   Lipid Profile (Completed)   HgB A1c (Completed)     Endocrine   Hyperlipidemia associated with type 2  diabetes mellitus (HCC)    Chronic, ongoing.  Continue current medication regimen and adjust as needed.  Lipid panel today.          Relevant Orders   Comp Met (  CMET) (Completed)   Lipid Profile (Completed)   HgB A1c (Completed)   Type 2 diabetes mellitus with hyperglycemia (HCC)    Chronic.  Ongoing.  Continue with Metformin.  Can consider a trial of GLP1 if, Trulicity, next visit if ongoing elevations >7%.  He can not tolerate increased Metformin due to GI symptoms and had urine infections with SGLT2.  Would avoid Glipizide due to his age and having hypoglycemia episodes with this in past.  Educated him on GLP1 today and he is aware that would be a beneficial next step if unable to get A1c below goal with current Metformin dosing and diet changes at home.  Recommend he monitor BS daily and document for next visit + focus heavily on diet changes (less potatoes, which are his favorite).  Continue Accupril for kidney protection.  Return in 3 months.        Relevant Orders   Comp Met (CMET) (Completed)   Lipid Profile (Completed)   HgB A1c (Completed)     Follow up plan: Return in about 3 months (around 01/03/2021) for HTN, HLD, DM2 FU.

## 2020-10-03 NOTE — Assessment & Plan Note (Addendum)
Chronic.  Ongoing.  Continue with Metformin.  Can consider a trial of GLP1 if, Trulicity, next visit if ongoing elevations >7%.  He can not tolerate increased Metformin due to GI symptoms and had urine infections with SGLT2.  Would avoid Glipizide due to his age and having hypoglycemia episodes with this in past.  Educated him on GLP1 today and he is aware that would be a beneficial next step if unable to get A1c below goal with current Metformin dosing and diet changes at home.  Recommend he monitor BS daily and document for next visit + focus heavily on diet changes (less potatoes, which are his favorite).  Continue Accupril for kidney protection.  Return in 3 months.

## 2020-10-03 NOTE — Assessment & Plan Note (Signed)
Chronic, ongoing.  Continue current medication regimen and adjust as needed. Lipid panel today. 

## 2020-10-03 NOTE — Assessment & Plan Note (Signed)
Chronic, ongoing with BP at goal.  Recommend he monitor BP at least a few mornings a week at home and document.  DASH diet at home.  Continue current medication regimen and adjust as needed.  Labs today.

## 2020-10-04 LAB — COMPREHENSIVE METABOLIC PANEL
ALT: 24 IU/L (ref 0–44)
AST: 21 IU/L (ref 0–40)
Albumin/Globulin Ratio: 2.6 — ABNORMAL HIGH (ref 1.2–2.2)
Albumin: 4.7 g/dL (ref 3.7–4.7)
Alkaline Phosphatase: 41 IU/L — ABNORMAL LOW (ref 44–121)
BUN/Creatinine Ratio: 11 (ref 10–24)
BUN: 18 mg/dL (ref 8–27)
Bilirubin Total: 0.3 mg/dL (ref 0.0–1.2)
CO2: 23 mmol/L (ref 20–29)
Calcium: 9.6 mg/dL (ref 8.6–10.2)
Chloride: 99 mmol/L (ref 96–106)
Creatinine, Ser: 1.61 mg/dL — ABNORMAL HIGH (ref 0.76–1.27)
Globulin, Total: 1.8 g/dL (ref 1.5–4.5)
Glucose: 144 mg/dL — ABNORMAL HIGH (ref 65–99)
Potassium: 4.3 mmol/L (ref 3.5–5.2)
Sodium: 137 mmol/L (ref 134–144)
Total Protein: 6.5 g/dL (ref 6.0–8.5)
eGFR: 44 mL/min/{1.73_m2} — ABNORMAL LOW (ref >59)

## 2020-10-04 LAB — LIPID PANEL
Chol/HDL Ratio: 5.8 ratio — ABNORMAL HIGH (ref 0.0–5.0)
Cholesterol, Total: 138 mg/dL (ref 100–199)
HDL: 24 mg/dL — ABNORMAL LOW (ref >39)
LDL Chol Calc (NIH): 53 mg/dL (ref 0–99)
Triglycerides: 402 mg/dL — ABNORMAL HIGH (ref 0–149)
VLDL Cholesterol Cal: 61 mg/dL — ABNORMAL HIGH (ref 5–40)

## 2020-10-04 LAB — HEMOGLOBIN A1C
Est. average glucose Bld gHb Est-mCnc: 169 mg/dL
Hgb A1c MFr Bld: 7.5 % — ABNORMAL HIGH (ref 4.8–5.6)

## 2020-10-06 ENCOUNTER — Encounter: Payer: Self-pay | Admitting: Nurse Practitioner

## 2020-10-06 ENCOUNTER — Other Ambulatory Visit: Payer: Self-pay | Admitting: Family Medicine

## 2020-10-06 NOTE — Progress Notes (Signed)
Please let patient know that his creatinine which is a measure of his kidney function worsened slightly from prior.  I would like him to come back in this week to make sure it has returned to his baseline. Please make him a lab appointment.   Patient's Triglycerides continue to increase. I recommend he start Fenofibrate to help with this.  I also recommend he decrease the amount of processed foods and refined sugar in his diet to help improve his triglycerides. If patient agrees to adding the Fenofibrate I can send this to the pharmacy for him.  Patient's A1c also increased to 7.5.  I recommend we add a once weekly injectable called Trulicity.  I am happy to show patient how to work the medication works.  This can help his kidneys as well as his diabetes. I know he is not able to increase the metformin due to GI distress but it is important that we get the A1c under control.    Please let me know what patient says about the changes.

## 2020-10-13 ENCOUNTER — Other Ambulatory Visit: Payer: Self-pay | Admitting: Nurse Practitioner

## 2020-10-21 ENCOUNTER — Telehealth: Payer: Self-pay | Admitting: Licensed Clinical Social Worker

## 2020-10-21 NOTE — Telephone Encounter (Signed)
    Clinical Social Work  Chronic Care Management   Phone Outreach    10/21/2020 Name: Duane Hill. MRN: [id number] DOB: 1940-10-29  Duane Hill. is a 80 y.o. year old male who is a primary care patient of Jon Billings, NP .   CCM LCSW reached out to patient today by phone to introduce self, assess needs and offer Care Management services and interventions.    Unable to leave a HIPPA compliant phone message due to voice mail not set up. 2nd unsuccessful telephone outreach attempt.  If unable to reach patient by phone on the 3rd attempt, will discontinue outreach calls but will be available at any time to provide services.   Plan:CCM LCSW will wait for return call. If no return call is received, Will route chart to Care Guide to see if patient would like to schedule a phone appointment.  Review of patient status, including review of consultants reports, relevant laboratory and other test results, and collaboration with appropriate care team members and the patient's provider was performed as part of comprehensive patient evaluation and provision of care management services.    Christa See, MSW, [email or web address] Phone [phone] 1:11 PM

## 2020-10-27 NOTE — Telephone Encounter (Signed)
Patient has been scheduled

## 2020-11-06 ENCOUNTER — Ambulatory Visit (INDEPENDENT_AMBULATORY_CARE_PROVIDER_SITE_OTHER): Payer: Medicare Other | Admitting: Licensed Clinical Social Worker

## 2020-11-06 DIAGNOSIS — E1169 Type 2 diabetes mellitus with other specified complication: Secondary | ICD-10-CM | POA: Diagnosis not present

## 2020-11-06 DIAGNOSIS — E785 Hyperlipidemia, unspecified: Secondary | ICD-10-CM

## 2020-11-06 DIAGNOSIS — E1165 Type 2 diabetes mellitus with hyperglycemia: Secondary | ICD-10-CM

## 2020-11-06 DIAGNOSIS — E1159 Type 2 diabetes mellitus with other circulatory complications: Secondary | ICD-10-CM

## 2020-11-06 DIAGNOSIS — I152 Hypertension secondary to endocrine disorders: Secondary | ICD-10-CM | POA: Diagnosis not present

## 2020-11-10 NOTE — Chronic Care Management (AMB) (Signed)
Chronic Care Management    Clinical Social Work Note  11/10/2020 Name: Duane Hill. MRN: [id number] DOB: [birth year]  Duane Hill. is a 80 y.o. year old male who is a primary care patient of Jon Billings, NP. The CCM team was consulted to assist the patient with chronic disease management and/or care coordination needs related to: Mental Health Counseling and Resources.   Engaged with patient by telephone for initial visit in response to provider referral for social work chronic care management and care coordination services.   Consent to Services:  The patient was given the following information about Chronic Care Management services today, agreed to services, and gave verbal consent: 1. CCM service includes personalized support from designated clinical staff supervised by the primary care provider, including individualized plan of care and coordination with other care providers 2. 24/7 contact phone numbers for assistance for urgent and routine care needs. 3. Service will only be billed when office clinical staff spend 20 minutes or more in a month to coordinate care. 4. Only one practitioner may furnish and bill the service in a calendar month. 5.The patient may stop CCM services at any time (effective at the end of the month) by phone call to the office staff. 6. The patient will be responsible for cost sharing (co-pay) of up to 20% of the service fee (after annual deductible is met). Patient agreed to services and consent obtained.  Patient agreed to services and consent obtained.   Assessment: Patient is engaged in conversation and is open to continued services with CCM LCSW. Strategies to assist with self-care and stress management discussed. See Care Plan below for interventions and patient self-care actives.  Recent life changes [place] Journey: Management of health conditions  Recommendation: Patient may benefit from, and is in agreement to work with LCSW to address care  coordination needs and will continue to work with the clinical team to address health care and disease management related needs.   Follow up Plan: Patient would like continued follow-up from CCM LCSW .  Follow up scheduled in 01/15/21. Patient will call office if needed prior to next encounter.    SDOH (Social Determinants of Health) assessments and interventions performed:  SDOH Interventions    Flowsheet Row Most Recent Value  SDOH Interventions   Food Insecurity Interventions Intervention Not Indicated  Housing Interventions Intervention Not Indicated  Intimate Partner Violence Interventions Intervention Not Indicated  Transportation Interventions Intervention Not Indicated  [Patient utilizes sister's vehicle]        Advanced Directives Status: Not addressed in this encounter.  CCM Care Plan  Allergies  Allergen Reactions   Protonix [Pantoprazole Sodium] Nausea And Vomiting    Outpatient Encounter Medications as of 11/06/2020  Medication Sig   acetaminophen (TYLENOL) 500 MG tablet Take 1,000 mg by mouth every 6 (six) hours as needed. Patient uss Caplet extra strength   albuterol (VENTOLIN HFA) 108 (90 Base) MCG/ACT inhaler Inhale 1-2 puffs into the lungs every 6 (six) hours as needed for wheezing or shortness of breath.   atorvastatin (LIPITOR) 20 MG tablet Take 1 tablet (20 mg total) by mouth daily.   Azelastine HCl 0.15 % SOLN Place 2 sprays into both nostrils 2 (two) times daily. Place 2 sprays into both nostrils 2 (two) times daily.   carvedilol (COREG) 25 MG tablet Take 1 tablet (25 mg total) by mouth 2 (two) times daily with a meal.   cetirizine (ZYRTEC) 10 MG tablet Take 1 tablet (10 mg total) by  mouth daily.   Choline Fenofibrate (FENOFIBRIC ACID) 135 MG CPDR Take 1 capsule by mouth daily.   fluticasone (FLONASE) 50 MCG/ACT nasal spray Place 1 spray into both nostrils 2 (two) times daily.   gabapentin (NEURONTIN) 300 MG capsule Take 2 capsules (600 mg total) by mouth 2  (two) times daily.   glipiZIDE (GLUCOTROL) 5 MG tablet Take 5 mg by mouth 2 (two) times daily before a meal.   glucose blood test strip CHECK BLOOD SUGAR TWICE DAILY.   hydrochlorothiazide (HYDRODIURIL) 25 MG tablet Take 1 tablet (25 mg total) by mouth daily.   metFORMIN (GLUCOPHAGE-XR) 500 MG 24 hr tablet Take 1 tablet (500 mg total) by mouth in the morning and at bedtime.   Microlet Lancets MISC USE TWICE DAILY AS DIRECTED.   montelukast (SINGULAIR) 10 MG tablet Take 1 tablet (10 mg total) by mouth at bedtime.   omeprazole (PRILOSEC) 40 MG capsule Take 1 capsule (40 mg total) by mouth daily.   quinapril (ACCUPRIL) 40 MG tablet Take 1 tablet (40 mg total) by mouth daily.   No facility-administered encounter medications on file as of 11/06/2020.    Patient Active Problem List   Diagnosis Date Noted   B12 deficiency 04/29/2020   HSV (herpes simplex virus) infection 05/10/2019   Allergic rhinitis 02/14/2019   Advanced care planning/counseling discussion 11/29/2016   GERD (gastroesophageal reflux disease) 11/05/2014   Hyperlipidemia associated with type 2 diabetes mellitus ([place]) 11/05/2014   Chronic radiation cystitis 11/05/2014   Hypertension associated with diabetes ([place]) 02/02/2013   Type 2 diabetes mellitus with hyperglycemia ([place]) 02/02/2013   History of prostate cancer 02/02/2013    Conditions to be addressed/monitored:  Stress ; Limited social support, Transportation, Mental Health Concerns , and Family and relationship dysfunction  Care Plan : General Social Work (Adult)  Updates made by Christa See D, LCSW since 11/10/2020 12:00 AM     Problem: Quality of Life (General Plan of Care)      Goal: Quality of Life Maintained   Start Date: 11/06/2020  This Visit's Progress: On track  Priority: Medium  Note:   Current barriers:   Acute Mental Health needs related to Stress Limited social support, Transportation, and Family and relationship dysfunction Needs Support,  Education, and Care Coordination in order to meet unmet mental health needs. Clinical Goal(s): Over the next 120 days, patient will work with SW, counselor and therapist to reduce or manage symptoms of agitation, mood instability, stress, and bipolar until connected for ongoing counseling. Clinical Interventions:  Assessed patient's previous and current treatment, coping skills, support system and barriers to care  Patient interviewed and appropriate assessments performed Patient reports ongoing leg weakness is "about the same" Patient is unsure if it's because of scoliosis (diagnosed at 80 yo) or if its because of his age. Patient reports back pain. He denies any strenuous activities or heavy lifting Patient reports that he doesn't have great balance anymore. CCM LCSW discussed strategies that patient can utilize to prevent risk of falling and promote safety. Patient has a cane that he will utilize occasionally Patient shared that on Sunday, July 17, he felt faint and was sweating. His blood sugar was 119 and when he ate something, it went back up to it's normal range (140-160) This has occurred one time prior a while back CCM LCSW discussed strategies to assist in management of diabetes. Patient is motivated because, "I don't want to lose any limbs" Patient reports that he doesnt check readings often, only check  it when feels like something is wrong. Patient agreed to increase the amount of times he checks his blood sugar to prevent episodes of hypoglycemia  Patient reports limited support. He does not speak with his two daughters and his son has not contacted him after re-locating out of state. Patient's oldest sister resides next door and he speaks with neighbors occasionally Patient receives emotional support from his cats (5-6) and his lab dog, Cooper CCM LCSW discussed benefits of implementing self-care in his daily routine. Strategies were identified Patient utilizes a calendar and MyChart to  keep up with appointments Patient was notified today about discontinuing MOW services. Patient reports that he is okay with it noting that he can prepare his meals currently Solution-Focused Strategies, Mindfulness or Relaxation Training, Active listening / Reflection utilized , Emotional Supportive Provided, Psychoeducation for mental health needs , Brief CBT , and Verbalization of feelings encouraged  ; Discussed plans with patient for ongoing care management follow up and provided patient with direct contact information for care management team Collaboration with PCP regarding development and update of comprehensive plan of care as evidenced by provider attestation and co-signature Inter-disciplinary care team collaboration (see longitudinal plan of care) Patient Goals/Self-Care Activities: Over the next 120 days Utilize healthy coping skills and self-care strategies discussed Continue compliance with medications Attend all scheduled appointments with providers Contact clinic with any questions or concerns       Christa See, MSW, [email or web address] Phone [phone] 8:30 AM

## 2020-11-10 NOTE — Patient Instructions (Signed)
Visit Information   Goals Addressed             This Visit's Progress    Management of Stress   On track    Timeframe:  Long-Range Goal Priority:  Medium Start Date:  11/06/20                           Expected End Date: 02/16/21                    Follow Up Date 01/15/21    Patient Goals/Self-Care Activities: Over the next 120 days Utilize healthy coping skills and self-care strategies discussed Continue compliance with medications Attend all scheduled appointments with providers Contact clinic with any questions or concerns        Patient verbalizes understanding of instructions provided today and agrees to view in [place].   Telephone follow up appointment with care management team member scheduled for:01/15/21  Christa See, MSW, [email or web address] Phone [phone] 8:33 AM

## 2020-11-24 ENCOUNTER — Other Ambulatory Visit: Payer: Self-pay | Admitting: Nurse Practitioner

## 2020-11-24 ENCOUNTER — Other Ambulatory Visit: Payer: Self-pay | Admitting: Family Medicine

## 2020-11-24 DIAGNOSIS — E785 Hyperlipidemia, unspecified: Secondary | ICD-10-CM

## 2020-11-24 NOTE — Telephone Encounter (Signed)
Notes to clinic:  this script has expired  Review for continued use and refill    Requested Prescriptions  Pending Prescriptions Disp Refills   albuterol (VENTOLIN HFA) 108 (90 Base) MCG/ACT inhaler [Pharmacy Med Name: ALBUTEROL HFA 90 MCG INHALER] 18 g 0    Sig: Take 2 puffs every 4-6 hours as necessary for shortness of breath      Pulmonology:  Beta Agonists Failed - 11/24/2020  9:29 AM      Failed - One inhaler should last at least one month. If the patient is requesting refills earlier, contact the patient to check for uncontrolled symptoms.      Passed - Valid encounter within last 12 months    Recent Outpatient Visits           1 month ago Hypertension associated with diabetes ([place])   [facility] Jon Billings, NP   7 months ago Type 2 diabetes mellitus with hyperglycemia, without long-term current use of insulin ([place])   [place], [place] T, NP   1 year ago Type 2 diabetes mellitus with hyperglycemia, without long-term current use of insulin ([place])   [place], Megan P, DO   1 year ago Type 2 diabetes mellitus with hyperglycemia, without long-term current use of insulin [facility])   [facility] [name], [place]   1 year ago [place], [place], [place]       Future Appointments             In 1 month Jon Billings, NP [employer], [facility]              Signed Prescriptions Disp Refills   Choline Fenofibrate (FENOFIBRIC ACID) 135 MG CPDR 30 capsule 0    Sig: Take 1 capsule by mouth daily.      Cardiovascular:  Antilipid - Fibric Acid Derivatives Failed - 11/24/2020  9:29 AM      Failed - HDL in normal range and within 360 days    HDL  Date Value Ref Range Status  10/03/2020 24 (L) >39 mg/dL Final          Failed - Triglycerides in normal range and within 360 days    Triglycerides  Date Value Ref Range Status  10/03/2020 402 (H) 0  - 149 mg/dL Final   Triglycerides Piccolo,Waived  Date Value Ref Range Status  05/25/2016 280 (H) <150 mg/dL Final    Comment:                            Normal                   <150                         Borderline High     150 - 199                         High                200 - 499                         Very High                >499  Failed - Cr in normal range and within 180 days    Creatinine  Date Value Ref Range Status  08/16/2014 1.11 mg/dL Final    Comment:    0.61-1.24 NOTE: New Reference Range  06/25/14    Creatinine, Ser  Date Value Ref Range Status  10/03/2020 1.61 (H) 0.76 - 1.27 mg/dL Final          Failed - eGFR in normal range and within 180 days    EGFR (African American)  Date Value Ref Range Status  08/16/2014 >60  Final   GFR calc Af Amer  Date Value Ref Range Status  04/28/2020 56 (L) >59 mL/min/1.73 Final    Comment:    **In accordance with recommendations from the NKF-ASN Task force,**   [facility] is in the process of updating its eGFR calculation to the   2021 CKD-EPI creatinine equation that estimates kidney function   without a race variable.    EGFR (Non-African Amer.)  Date Value Ref Range Status  08/16/2014 >60  Final    Comment:    eGFR values <13mL/min/1.73 m2 may be an indication of chronic kidney disease (CKD). Calculated eGFR is useful in patients with stable renal function. The eGFR calculation will not be reliable in acutely ill patients when serum creatinine is changing rapidly. It is not useful in patients on dialysis. The eGFR calculation may not be applicable to patients at the low and high extremes of body sizes, pregnant women, and vegetarians.    GFR calc non Af Amer  Date Value Ref Range Status  04/28/2020 48 (L) >59 mL/min/1.73 Final   eGFR  Date Value Ref Range Status  10/03/2020 44 (L) >59 mL/min/1.73 Final          Passed - Total Cholesterol in normal range and within 360 days     Cholesterol, Total  Date Value Ref Range Status  10/03/2020 138 100 - 199 mg/dL Final   Cholesterol Piccolo, Waived  Date Value Ref Range Status  05/25/2016 141 <200 mg/dL Final    Comment:                            Desirable                <200                         Borderline High      200- 239                         High                     >239           Passed - LDL in normal range and within 360 days    LDL Chol Calc (NIH)  Date Value Ref Range Status  10/03/2020 53 0 - 99 mg/dL Final          Passed - ALT in normal range and within 180 days    ALT  Date Value Ref Range Status  10/03/2020 24 0 - 44 IU/L Final   ALT (SGPT) Piccolo, Waived  Date Value Ref Range Status  05/25/2016 31 10 - 47 U/L Final          Passed - AST in normal range and within 180 days    AST  Date Value Ref  Range Status  10/03/2020 21 0 - 40 IU/L Final   AST (SGOT) Piccolo, Waived  Date Value Ref Range Status  05/25/2016 27 11 - 38 U/L Final          Passed - Valid encounter within last 12 months    Recent Outpatient Visits           1 month ago Hypertension associated with diabetes ([place])   [facility] Jon Billings, NP   7 months ago Type 2 diabetes mellitus with hyperglycemia, without long-term current use of insulin ([place])   [place], [place] T, NP   1 year ago Type 2 diabetes mellitus with hyperglycemia, without long-term current use of insulin ([place])   [place], Megan P, DO   1 year ago Type 2 diabetes mellitus with hyperglycemia, without long-term current use of insulin [facility])   [facility] [name], [place]   1 year ago Dysuria   [facility] [name], [place]       Future Appointments             In 1 month Jon Billings, NP [facility], [place]

## 2020-12-01 ENCOUNTER — Other Ambulatory Visit: Payer: Self-pay | Admitting: Nurse Practitioner

## 2020-12-01 DIAGNOSIS — I10 Essential (primary) hypertension: Secondary | ICD-10-CM

## 2020-12-08 ENCOUNTER — Other Ambulatory Visit: Payer: Self-pay | Admitting: Nurse Practitioner

## 2020-12-08 NOTE — Telephone Encounter (Signed)
Notes to clinic: Patient has appointment on 01/05/2021 Review for refills Last filled on 10/06/20 for 90 day  Fa Cr in normal range and within 360 days   eGFR in normal range and within 360 days  iled protocol:    Requested Prescriptions  Pending Prescriptions Disp Refills   gabapentin (NEURONTIN) 300 MG capsule [Pharmacy Med Name: GABAPENTIN 300 MG CAPSULE] 360 capsule 0    Sig: Take 2 capsules (600 mg total) by mouth 2 (two) times daily.     Neurology: Anticonvulsants - gabapentin Passed - 12/08/2020 10:08 AM      Passed - Valid encounter within last 12 months    Recent Outpatient Visits           2 months ago Hypertension associated with diabetes [facility])   [facility] Jon Billings, NP   7 months ago Type 2 diabetes mellitus with hyperglycemia, without long-term current use of insulin ([place])   [place], [place] T, NP   1 year ago Type 2 diabetes mellitus with hyperglycemia, without long-term current use of insulin ([place])   [place], Megan P, DO   1 year ago Type 2 diabetes mellitus with hyperglycemia, without long-term current use of insulin [facility])   [facility] [name], [place]   1 year ago [place], [place], [place]       Future Appointments             In 4 weeks Jon Billings, NP [place], [facility]             metFORMIN (GLUCOPHAGE-XR) 500 MG 24 hr tablet [Pharmacy Med Name: METFORMIN HCL ER 500 MG TABLET] 180 tablet 0    Sig: Take 1 tablet (500 mg total) by mouth in the morning and at bedtime.     Endocrinology:  Diabetes - Biguanides Failed - 12/08/2020 10:08 AM      Failed - Cr in normal range and within 360 days    Creatinine  Date Value Ref Range Status  08/16/2014 1.11 mg/dL Final    Comment:    0.61-1.24 NOTE: New Reference Range  06/25/14    Creatinine, Ser  Date Value Ref Range Status  10/03/2020 1.61 (H)  0.76 - 1.27 mg/dL Final          Failed - eGFR in normal range and within 360 days    EGFR (African American)  Date Value Ref Range Status  08/16/2014 >60  Final   GFR calc Af Amer  Date Value Ref Range Status  04/28/2020 56 (L) >59 mL/min/1.73 Final    Comment:    **In accordance with recommendations from the NKF-ASN Task force,**   [facility] is in the process of updating its eGFR calculation to the   2021 CKD-EPI creatinine equation that estimates kidney function   without a race variable.    EGFR (Non-African Amer.)  Date Value Ref Range Status  08/16/2014 >60  Final    Comment:    eGFR values <17m/min/1.73 m2 may be an indication of chronic kidney disease (CKD). Calculated eGFR is useful in patients with stable renal function. The eGFR calculation will not be reliable in acutely ill patients when serum creatinine is changing rapidly. It is not useful in patients on dialysis. The eGFR calculation may not be applicable to patients at the low and high extremes of body sizes, pregnant women, and vegetarians.    GFR calc non Af AWyvonnia Lora  Date Value Ref Range Status  04/28/2020 48 (L) >59 mL/min/1.73 Final   eGFR  Date Value Ref Range Status  10/03/2020 44 (L) >59 mL/min/1.73 Final          Passed - HBA1C is between 0 and 7.9 and within 180 days    Hemoglobin A1C  Date Value Ref Range Status  08/15/2014 6.7 (H) % Final    Comment:    4.0-6.0 NOTE: New Reference Range  06/25/14    HB A1C (BAYER DCA - WAIVED)  Date Value Ref Range Status  04/28/2020 7.2 (H) <7.0 % Final    Comment:                                          Diabetic Adult            <7.0                                       Healthy Adult        4.3 - 5.7                                                           (DCCT/NGSP) American Diabetes Association's Summary of Glycemic Recommendations for Adults with Diabetes: Hemoglobin A1c <7.0%. More stringent glycemic goals (A1c <6.0%) may further reduce  complications at the cost of increased risk of hypoglycemia.    Hgb A1c MFr Bld  Date Value Ref Range Status  10/03/2020 7.5 (H) 4.8 - 5.6 % Final    Comment:             Prediabetes: 5.7 - 6.4          Diabetes: >6.4          Glycemic control for adults with diabetes: <7.0           Passed - Valid encounter within last 6 months    Recent Outpatient Visits           2 months ago Hypertension associated with diabetes ([place])   [facility] Jon Billings, NP   7 months ago Type 2 diabetes mellitus with hyperglycemia, without long-term current use of insulin ([place])   [place], [place] T, NP   1 year ago Type 2 diabetes mellitus with hyperglycemia, without long-term current use of insulin ([place])   [place], Megan P, DO   1 year ago Type 2 diabetes mellitus with hyperglycemia, without long-term current use of insulin [facility])   [facility] [name], [place]   1 year ago [place], Lilia Argue, [place]       Future Appointments             In 4 weeks Jon Billings, NP [facility], [place]

## 2020-12-08 NOTE — Telephone Encounter (Signed)
Patient last seen 10/03/20 and has appointment 01/05/21

## 2020-12-24 ENCOUNTER — Telehealth: Payer: Medicare Other | Admitting: General Practice

## 2020-12-24 ENCOUNTER — Ambulatory Visit (INDEPENDENT_AMBULATORY_CARE_PROVIDER_SITE_OTHER): Payer: Medicare Other

## 2020-12-24 DIAGNOSIS — E1159 Type 2 diabetes mellitus with other circulatory complications: Secondary | ICD-10-CM

## 2020-12-24 DIAGNOSIS — W19XXXD Unspecified fall, subsequent encounter: Secondary | ICD-10-CM

## 2020-12-24 DIAGNOSIS — I152 Hypertension secondary to endocrine disorders: Secondary | ICD-10-CM

## 2020-12-24 DIAGNOSIS — E1169 Type 2 diabetes mellitus with other specified complication: Secondary | ICD-10-CM

## 2020-12-24 DIAGNOSIS — E785 Hyperlipidemia, unspecified: Secondary | ICD-10-CM

## 2020-12-24 DIAGNOSIS — E1165 Type 2 diabetes mellitus with hyperglycemia: Secondary | ICD-10-CM

## 2020-12-24 NOTE — Patient Instructions (Signed)
Visit Information  PATIENT GOALS:  Goals Addressed             This Visit's Progress    RNCM - Glycemic Management Optimized       Timeframe:  Long-Range Goal Priority:  Medium Start Date:   09-24-2020                          Expected End Date:  10-06-2021 Follow Up Date: 03-03-2021  Lab Results  Component Value Date   HGBA1C 7.5 (H) 10/03/2020    Patient Goals:  - check blood sugar at prescribed times - check blood sugar before and after exercise - check blood sugar if I feel it is too high or too low - enter blood sugar readings and medication or insulin into daily log - take the blood sugar log to all doctor visits  - change to whole grain breads, cereal, pasta - drink 6 to 8 glasses of water each day - eat fish at least once per week - fill half of plate with vegetables - limit fast food meals to no more than 1 per week - manage portion size - prepare main meal at home 3 to 5 days each week - read food labels for fat, fiber, carbohydrates and portion size - reduce red meat to 2 to 3 times a week - switch to low-fat or skim milk - switch to sugar-free drinks  - check feet daily for cuts, sores or redness - keep feet up while sitting - trim toenails straight across - wash and dry feet carefully every day - wear comfortable, cotton socks - wear comfortable, well-fitting shoes  - barriers to adherence to treatment plan identified - blood glucose monitoring encouraged - blood glucose readings reviewed - resources required to improve adherence to care identified - self-awareness of signs/symptoms of hypo or hyperglycemia encouraged - use of blood glucose monitoring log promoted  12-24-2020: The patient states he is not checking his blood sugars a lot but when he does it is usually around 130.  Discussed note in my Chart and the patient states that he has a hard time getting to notes and things like that. He did not receive the information about fenofibrate or Trulicity.    He would like to talk to pcp before starting any new medications. Has an appointment for 01-05-2021 at 240 pm but says he does not think he will be able to come because his sisters car is broke down and he is also having a hard time with walking and shortness of breath.  Will send office an in basket message to see if the visit can be changed to a phone visit.      RNCM - Hypertension Monitored & Managed       Timeframe:  Long-Range Goal Priority:  Medium Start Date:     09-24-2020                        Expected End Date:    09-24-2021  Follow Up Date: 03-03-2021                 Patient Goals: - check blood pressure weekly- currently blood pressure machine is unable to function. - choose a place to take my blood pressure (home, clinic or office, retail store) - write blood pressure results in a log or diary - agree on reward when goals are met - agree to work together  to make changes - ask questions to understand - have a family meeting to talk about healthy habits - learn about high blood pressure - blood pressure trends reviewed - home or ambulatory blood pressure monitoring encouraged  12-24-2020: The patient states he is doing well with managing his blood pressures. Denies any current issues with blood pressure management     RNCM: Prevent Falls and Injury       Follow Up Date 03-03-2021   - add more outdoor lighting - always use handrails on the stairs - always wear low-heeled or flat shoes or slippers with nonskid soles - call the doctor if I am feeling too drowsy - install bathroom grab bars - keep a flashlight by the bed - keep my cell phone with me always - learn how to get back up if I fall - make an emergency alert plan in case I fall - pick up clutter from the floors - use a nonslip pad with throw rugs, or remove them completely - use a cane or walker - use a nightlight in the bathroom    Why is this important?   Most falls happen when it is hard for you to walk  safely. Your balance may be off because of an illness. You may have pain in your knees, hip or other joints.  You may be overly tired or taking medicines that make you sleepy. You may not be able to see or hear clearly.  Falls can lead to broken bones, bruises or other injuries.  There are things you can do to help prevent falling.     Notes: Fall x 2 weeks ago when walking home from the store. Knees hit the sidewalk and tore his jeans. 09/24/2020: No recent falls.  Patient states he does get dizzy when he moves too fast and his legs "won't hold him up" at times.  Patient being extra cautious when walking.12-24-2020: New fall on 12-22-2020 when getting groceries in from the grocery store into his house. States that he is having to use his cane all the time.  The patient states that he easily gets short of breath and weakness in his legs. Review of safety and falls prevention.         Patient verbalizes understanding of instructions provided today and agrees to view in [place].   Telephone follow up appointment with care management team member scheduled for: 03-03-2021 at 68 pm  Noreene Larsson RN, MSN, [facility] Mobile: [phone]

## 2020-12-24 NOTE — Chronic Care Management (AMB) (Signed)
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     Long-Range Goal: RNCM: HLD Management   Start Date: 12/24/2020  Expected End Date: 12/24/2021  This Visit's Progress: On track  Priority: High  Note:   Current Barriers:  Poorly controlled hyperlipidemia, complicated by HTN, DM Current antihyperlipidemic regimen: recommended by pcp Fenofibrate 135 mg, and Lipitor 20 mg  Most recent lipid panel:     Component Value Date/Time   CHOL 138 10/03/2020 1616   CHOL 141 05/25/2016 1032   TRIG 402 (H) 10/03/2020 1616   TRIG 280 (H) 05/25/2016 1032   HDL 24 (L) 10/03/2020 1616   CHOLHDL 5.8 (H) 10/03/2020 1616   VLDL 56 (H) 05/25/2016 1032   [place] 53 10/03/2020 1616   ASCVD risk enhancing conditions: age >41, DM, HTN, CKD, former smoker Unable to independently manage HLD  Does not attend all scheduled provider appointments Does not adhere to prescribed medication regimen Lacks social connections Unable to perform IADLs independently Does not maintain contact with provider office Does not contact provider office for questions/concerns RN Care Manager  Clinical Goal(s):  patient will work with Consulting civil engineer, providers, and care team towards execution of optimized self-health management plan patient will verbalize understanding of plan for effective management of HLD patient will work with CCM team and pcp  to address needs related to effective management of HLD  patient will take all medications exactly as prescribed and will call provider for medication related questions patient will attend all scheduled medical appointments: 01-05-2021 at 240 pm patient  will demonstrate improved adherence to prescribed treatment plan for HLD patient will demonstrate improved health management independence patient will verbalize basic understanding of HLD disease process and self health management plan patient will demonstrate understanding of rationale for each prescribed medication the patient will demonstrate ongoing self health care management ability Interventions: Collaboration with Jon Billings, NP regarding development and update of comprehensive plan of care as evidenced by provider attestation and co-signature Inter-disciplinary care team collaboration (see longitudinal plan of care) Medication review performed; medication list updated in electronic medical record 12-24-2020: The patient states he is not taking the fenofibrate for his cholesterol. In basket message sent to clinical staff and pcp Inter-disciplinary care team collaboration (see longitudinal plan of care) Referred to pharmacy team for assistance with HLD medication management Evaluation of current treatment plan related to HLD  and patient's adherence to plan as established by provider. Advised patient to talk to the pcp about plan of care for managing HLD. The patient states he gets easily confused with my chart and did not follow up on recommendations from the pcp. Reviewed notes with the patient today. He is unsure if he can come on the 19th to see pcp because of transportation.  Message sent to clinical staff and pcp with patients expressed concerns.  Provided education to patient re: not using processed meats or refined sugars in diet, monitoring for a heart healthy diet.  Reviewed medications with patient and discussed recommendations, will do a pharmacy consult for assistance with education and reconciliation Collaborated with pcp, clinical team, and pharm D regarding medications and recommendations for effective management of HLD Reviewed scheduled/upcoming provider appointments including: 01-05-2021 at 240 pm Discussed plans with patient for ongoing care management follow up and provided patient with direct contact information for care management team Patient Goals/Self-Care Activities: - call for medicine refill 2 or 3 days before it runs out - call if I am sick and can't take my medicine - keep a list of all the medicines I take; vitamins and herbals too - learn to read medicine labels - use a pillbox to sort medicine - use an alarm clock or phone to remind me to take my medicine - change to whole grain breads, cereal, pasta - drink 6 to 8 glasses of water each day - eat 3 to 5 servings of fruits and vegetables each day - eat 5 or 6 small meals each day - eat fish at least once per week - fill half the plate with nonstarchy vegetables - limit fast food meals to no more than 1 per week - manage portion size - prepare main meal at home 3 to 5 days each week - read food labels for fat, fiber, carbohydrates and portion size - be open to making changes - I can manage, know and watch for signs of a heart attack - if I have chest pain, call for help - learn about small changes that will make a big difference - learn my personal risk factors  Follow Up Plan: Telephone follow up appointment with care management team member scheduled for: 03-03-2021 at 230 pm       Plan:Telephone follow up appointment with care management team member scheduled for:  03-03-2021  at 230 pm  [place], MSN, [facility] Mobile: [phone]

## 2020-12-25 ENCOUNTER — Telehealth: Payer: Self-pay | Admitting: Nurse Practitioner

## 2020-12-25 ENCOUNTER — Telehealth: Payer: Self-pay

## 2020-12-25 NOTE — Chronic Care Management (AMB) (Signed)
  Chronic Care Management   Note  12/25/2020 Name: Ezio Kiplinger. MRN: [id number] DOB: 15-Oct-1940  [name]. is a 80 y.o. year old male who is a primary care patient of Jon Billings, NP. [name]. is currently enrolled in care management services. An additional referral for pharm d  was placed.   Follow up plan: Telephone appointment with care management team member scheduled for:01/12/2021  Noreene Larsson, [place], [place], [place] [address] Direct Dial: [phone] [email or web address]

## 2020-12-25 NOTE — Progress Notes (Signed)
Patient has been scheduled

## 2020-12-25 NOTE — Telephone Encounter (Signed)
   Telephone encounter was:  Successful.  12/25/2020 Name: Duane Hill. MRN: [id number] DOB: 1940-12-06  Duane Hill. is a 80 y.o. year old male who is a primary care patient of Jon Billings, NP . The community resource team was consulted for assistance with Transportation Needs   Care guide performed the following interventions:  Spoke with patient and confirmed appointment on 9/19 at 2:40. Called [facility] [employer] and arranged a ride for the appt. They will call to confirm the ride and give the pt details on when they will be there for pick up .  Follow Up Plan:  No further follow up planned at this time. The patient has been provided with needed resources.  April Green Care Guide, Embedded Care Coordination [place], Care Management Phone: [phone] Email: [email or web address]

## 2020-12-26 ENCOUNTER — Other Ambulatory Visit: Payer: Self-pay | Admitting: Family Medicine

## 2020-12-26 DIAGNOSIS — E785 Hyperlipidemia, unspecified: Secondary | ICD-10-CM

## 2020-12-26 NOTE — Telephone Encounter (Signed)
Requested medications are due for refill today.  yes  Requested medications are on the active medications list.  yes  Last refill. 11/24/2020  Future visit scheduled.   yes  Notes to clinic.  Failed protocol D/T abnormal labs. Pt was to return for repeat labs per note of 10/03/2020.

## 2020-12-29 ENCOUNTER — Other Ambulatory Visit: Payer: Self-pay | Admitting: Nurse Practitioner

## 2020-12-29 DIAGNOSIS — E785 Hyperlipidemia, unspecified: Secondary | ICD-10-CM

## 2020-12-29 NOTE — Progress Notes (Signed)
Patient has been scheduled for 01/12/2021 is this ok?  Noreene Larsson, [place], [place], [place] [address] Direct Dial: [phone] [email or web address] Website: [email or web address]

## 2020-12-29 NOTE — Telephone Encounter (Signed)
Valid encounter.  Future visit in 1 week

## 2021-01-05 ENCOUNTER — Ambulatory Visit (INDEPENDENT_AMBULATORY_CARE_PROVIDER_SITE_OTHER): Payer: Medicare Other | Admitting: Nurse Practitioner

## 2021-01-05 ENCOUNTER — Other Ambulatory Visit: Payer: Self-pay

## 2021-01-05 ENCOUNTER — Encounter: Payer: Self-pay | Admitting: Nurse Practitioner

## 2021-01-05 VITALS — BP 142/78 | HR 55 | Temp 98.1°F | Ht 66.9 in | Wt 170.4 lb

## 2021-01-05 DIAGNOSIS — E1159 Type 2 diabetes mellitus with other circulatory complications: Secondary | ICD-10-CM | POA: Diagnosis not present

## 2021-01-05 DIAGNOSIS — E785 Hyperlipidemia, unspecified: Secondary | ICD-10-CM

## 2021-01-05 DIAGNOSIS — R531 Weakness: Secondary | ICD-10-CM | POA: Diagnosis not present

## 2021-01-05 DIAGNOSIS — E1169 Type 2 diabetes mellitus with other specified complication: Secondary | ICD-10-CM

## 2021-01-05 DIAGNOSIS — E1165 Type 2 diabetes mellitus with hyperglycemia: Secondary | ICD-10-CM | POA: Diagnosis not present

## 2021-01-05 DIAGNOSIS — Z23 Encounter for immunization: Secondary | ICD-10-CM

## 2021-01-05 DIAGNOSIS — I152 Hypertension secondary to endocrine disorders: Secondary | ICD-10-CM | POA: Diagnosis not present

## 2021-01-05 DIAGNOSIS — R8290 Unspecified abnormal findings in urine: Secondary | ICD-10-CM

## 2021-01-05 LAB — MICROSCOPIC EXAMINATION: Bacteria, UA: NONE SEEN

## 2021-01-05 LAB — URINALYSIS, ROUTINE W REFLEX MICROSCOPIC
Glucose, UA: NEGATIVE
Nitrite, UA: NEGATIVE
RBC, UA: NEGATIVE
Specific Gravity, UA: 1.025 (ref 1.005–1.030)
Urobilinogen, Ur: 1 mg/dL (ref 0.2–1.0)
pH, UA: 6.5 (ref 5.0–7.5)

## 2021-01-05 NOTE — Assessment & Plan Note (Signed)
Chronic, ongoing.  Continue current medication regimen and adjust as needed.  Lipid panel today.  Continue with Atorvastatin 20mg  daily.

## 2021-01-05 NOTE — Assessment & Plan Note (Signed)
Chronic.  Ongoing.  Continue with Metformin.  Can consider a trial of GLP1  Trulicity, if next visit if ongoing elevations >7%.  He can not tolerate increased Metformin due to GI symptoms and had urine infections with SGLT2.  Would avoid Glipizide due to his age and having hypoglycemia episodes with this in past and recent falls. Recommend he monitor BS daily and document for next visit + focus heavily on diet changes (less potatoes, which are his favorite).  Continue Accupril for kidney protection.  Return in 3 months.  Labs ordered today. Will make further recommendations based on lab results.

## 2021-01-05 NOTE — Progress Notes (Signed)
BP (!) 142/78   Pulse (!) 55   Temp 98.1 F (36.7 C) (Oral)   Ht 5' 6.9" (1.699 m)   Wt 170 lb 6.4 oz (77.3 kg)   SpO2 97%   BMI 26.77 kg/m    Subjective:    Patient ID: Duane Edelman., male    DOB: 1940/07/28, 80 y.o.   MRN: [id number]  HPI: Duane Cain. is a 80 y.o. male  Chief Complaint  Patient presents with   Diabetes   Hyperlipidemia   Hypertension   HYPERTENSION / [place] Satisfied with current treatment? no Duration of hypertension: years BP monitoring frequency: not checking BP range:  BP medication side effects: no Past BP meds: carvedilol, HCTZ, and quinapril Duration of hyperlipidemia: years Cholesterol medication side effects: no Cholesterol supplements: none Past cholesterol medications: atorvastain (lipitor) Medication compliance: excellent compliance Aspirin: no Recent stressors: no Recurrent headaches: no Visual changes: no Palpitations: no Dyspnea: no Chest pain: no Lower extremity edema: no Dizzy/lightheaded: no  DIABETES Hypoglycemic episodes:no Polydipsia/polyuria: no Visual disturbance: no Chest pain: no Paresthesias: no Glucose Monitoring: no  Accucheck frequency: Not Checking  Fasting glucose:  Post prandial:  Evening:  Before meals: Taking Insulin?: no  Long acting insulin:  Short acting insulin: Blood Pressure Monitoring: not checking Retinal Examination: Not Up to Date Foot Exam: Up to Date Diabetic Education: Not Completed Pneumovax: Up to Date Influenza: Up to Date Aspirin: no  Patient states he feels like his legs are getting worse.  He states he when he gets up after sitting he is okay for a few minutes.  Patient states he has fallen a couple of times.  He was carrying groceries in both hands and felt himself going toward and fell forward.     Relevant past medical, surgical, family and social history reviewed and updated as indicated. Interim medical history since our last visit  reviewed. Allergies and medications reviewed and updated.  Review of Systems  Eyes:  Negative for visual disturbance.  Respiratory:  Negative for chest tightness and shortness of breath.   Cardiovascular:  Negative for chest pain, palpitations and leg swelling.  Endocrine: Negative for polydipsia and polyuria.  Neurological:  Positive for weakness. Negative for dizziness, light-headedness, numbness and headaches.   Per HPI unless specifically indicated above     Objective:    BP (!) 142/78   Pulse (!) 55   Temp 98.1 F (36.7 C) (Oral)   Ht 5' 6.9" (1.699 m)   Wt 170 lb 6.4 oz (77.3 kg)   SpO2 97%   BMI 26.77 kg/m   Wt Readings from Last 3 Encounters:  01/05/21 170 lb 6.4 oz (77.3 kg)  10/03/20 167 lb 6 oz (75.9 kg)  04/28/20 172 lb (78 kg)    Physical Exam Vitals and nursing note reviewed.  Constitutional:      General: He is not in acute distress.    Appearance: Normal appearance. He is not ill-appearing, toxic-appearing or diaphoretic.  HENT:     Head: Normocephalic.     Right Ear: External ear normal.     Left Ear: External ear normal.     Nose: Nose normal. No congestion or rhinorrhea.     Mouth/Throat:     Mouth: Mucous membranes are moist.  Eyes:     General:        Right eye: No discharge.        Left eye: No discharge.     Extraocular Movements: Extraocular movements intact.  Conjunctiva/sclera: Conjunctivae normal.     Pupils: Pupils are equal, round, and reactive to light.  Cardiovascular:     Rate and Rhythm: Normal rate and regular rhythm.     Heart sounds: No murmur heard. Pulmonary:     Effort: Pulmonary effort is normal. No respiratory distress.     Breath sounds: Normal breath sounds. No wheezing, rhonchi or rales.  Abdominal:     General: Abdomen is flat. Bowel sounds are normal.  Musculoskeletal:     Cervical back: Normal range of motion and neck supple.  Skin:    General: Skin is warm and dry.     Capillary Refill: Capillary refill  takes less than 2 seconds.  Neurological:     General: No focal deficit present.     Mental Status: He is alert and oriented to person, place, and time.  Psychiatric:        Mood and Affect: Mood normal.        Behavior: Behavior normal.        Thought Content: Thought content normal.        Judgment: Judgment normal.    Results for orders placed or performed in visit on 10/03/20  Comp Met (CMET)  Result Value Ref Range   Glucose 144 (H) 65 - 99 mg/dL   BUN 18 8 - 27 mg/dL   Creatinine, Ser 1.61 (H) 0.76 - 1.27 mg/dL   eGFR 44 (L) >59 mL/min/1.73   BUN/Creatinine Ratio 11 10 - 24   Sodium 137 134 - 144 mmol/L   Potassium 4.3 3.5 - 5.2 mmol/L   Chloride 99 96 - 106 mmol/L   CO2 23 20 - 29 mmol/L   Calcium 9.6 8.6 - 10.2 mg/dL   Total Protein 6.5 6.0 - 8.5 g/dL   Albumin 4.7 3.7 - 4.7 g/dL   Globulin, Total 1.8 1.5 - 4.5 g/dL   Albumin/Globulin Ratio 2.6 (H) 1.2 - 2.2   Bilirubin Total 0.3 0.0 - 1.2 mg/dL   Alkaline Phosphatase 41 (L) 44 - 121 IU/L   AST 21 0 - 40 IU/L   ALT 24 0 - 44 IU/L  Lipid Profile  Result Value Ref Range   Cholesterol, Total 138 100 - 199 mg/dL   Triglycerides 402 (H) 0 - 149 mg/dL   HDL 24 (L) >39 mg/dL   VLDL Cholesterol Cal 61 (H) 5 - 40 mg/dL   LDL Chol Calc (NIH) 53 0 - 99 mg/dL   Chol/HDL Ratio 5.8 (H) 0.0 - 5.0 ratio  HgB A1c  Result Value Ref Range   Hgb A1c MFr Bld 7.5 (H) 4.8 - 5.6 %   Est. average glucose Bld gHb Est-mCnc 169 mg/dL      Assessment & Plan:   Problem List Items Addressed This Visit       Cardiovascular and Mediastinum   Hypertension associated with diabetes ([place]) - Primary    Chronic, ongoing with BP at goal.  Recommend he monitor BP at least a few mornings a week at home and document.  Patient does not currently check blood pressures. DASH diet at home.  Continue current medication regimen and adjust as needed.  Labs today.  Will make further recommendations based on lab results.       Relevant Orders   Comp Met  (CMET)   Urinalysis, Routine w reflex microscopic     Endocrine   Hyperlipidemia associated with type 2 diabetes mellitus (HCC)    Chronic, ongoing.  Continue current medication regimen  and adjust as needed.  Lipid panel today.  Continue with Atorvastatin 63m daily.       Relevant Orders   Lipid Profile   Urinalysis, Routine w reflex microscopic   Type 2 diabetes mellitus with hyperglycemia (HCC)    Chronic.  Ongoing.  Continue with Metformin.  Can consider a trial of GLP1  Trulicity, if next visit if ongoing elevations >7%.  He can not tolerate increased Metformin due to GI symptoms and had urine infections with SGLT2.  Would avoid Glipizide due to his age and having hypoglycemia episodes with this in past and recent falls. Recommend he monitor BS daily and document for next visit + focus heavily on diet changes (less potatoes, which are his favorite).  Continue Accupril for kidney protection.  Return in 3 months.  Labs ordered today. Will make further recommendations based on lab results.        Relevant Orders   HgB A1c   Urinalysis, Routine w reflex microscopic   Other Visit Diagnoses     Need for influenza vaccination       Relevant Orders   Flu Vaccine QUAD High Dose(Fluad) (Completed)   Weakness       Physical therapy ordered in the home as patient does not drive. Patient would benefit from exercises to strength his lower extremities.    Relevant Orders   Ambulatory referral to [facility]       Follow up plan: Return in about 3 months (around 04/06/2021) for HTN, HLD, DM2 FU.

## 2021-01-05 NOTE — Assessment & Plan Note (Signed)
Chronic, ongoing with BP at goal.  Recommend he monitor BP at least a few mornings a week at home and document.  Patient does not currently check blood pressures. DASH diet at home.  Continue current medication regimen and adjust as needed.  Labs today.  Will make further recommendations based on lab results.

## 2021-01-06 DIAGNOSIS — R8290 Unspecified abnormal findings in urine: Secondary | ICD-10-CM | POA: Diagnosis not present

## 2021-01-06 LAB — COMPREHENSIVE METABOLIC PANEL
ALT: 30 IU/L (ref 0–44)
AST: 31 IU/L (ref 0–40)
Albumin/Globulin Ratio: 2.4 — ABNORMAL HIGH (ref 1.2–2.2)
Albumin: 4.6 g/dL (ref 3.7–4.7)
Alkaline Phosphatase: 61 IU/L (ref 44–121)
BUN/Creatinine Ratio: 7 — ABNORMAL LOW (ref 10–24)
BUN: 11 mg/dL (ref 8–27)
Bilirubin Total: 0.3 mg/dL (ref 0.0–1.2)
CO2: 22 mmol/L (ref 20–29)
Calcium: 8.6 mg/dL (ref 8.6–10.2)
Chloride: 106 mmol/L (ref 96–106)
Creatinine, Ser: 1.55 mg/dL — ABNORMAL HIGH (ref 0.76–1.27)
Globulin, Total: 1.9 g/dL (ref 1.5–4.5)
Glucose: 146 mg/dL — ABNORMAL HIGH (ref 65–99)
Potassium: 4.9 mmol/L (ref 3.5–5.2)
Sodium: 140 mmol/L (ref 134–144)
Total Protein: 6.5 g/dL (ref 6.0–8.5)
eGFR: 46 mL/min/{1.73_m2} — ABNORMAL LOW (ref >59)

## 2021-01-06 LAB — LIPID PANEL
Chol/HDL Ratio: 6.8 ratio — ABNORMAL HIGH (ref 0.0–5.0)
Cholesterol, Total: 156 mg/dL (ref 100–199)
HDL: 23 mg/dL — ABNORMAL LOW (ref >39)
LDL Chol Calc (NIH): 83 mg/dL (ref 0–99)
Triglycerides: 303 mg/dL — ABNORMAL HIGH (ref 0–149)
VLDL Cholesterol Cal: 50 mg/dL — ABNORMAL HIGH (ref 5–40)

## 2021-01-06 LAB — HEMOGLOBIN A1C
Est. average glucose Bld gHb Est-mCnc: 151 mg/dL
Hgb A1c MFr Bld: 6.9 % — ABNORMAL HIGH (ref 4.8–5.6)

## 2021-01-06 NOTE — Progress Notes (Signed)
Please let patient know that his lab work shows that his kidney function is relatively the same. A1c has improved from 7.5 to 6.9.  Keep up the good work.  Cholesterol has also improved from prior. Continue to decrease processed foods and refined sugar intake. Please let him know that his urine showed it has some bacteria in it.  If he needs an antibiotic we will let him know once further testing is complete. For now, he should continue with his current medication regimen.  Follow up as discussed.

## 2021-01-06 NOTE — Addendum Note (Signed)
Addended by: Jon Billings on: 01/06/2021 08:12 AM   Modules accepted: Orders

## 2021-01-09 ENCOUNTER — Telehealth: Payer: Self-pay | Admitting: Nurse Practitioner

## 2021-01-09 DIAGNOSIS — E538 Deficiency of other specified B group vitamins: Secondary | ICD-10-CM | POA: Diagnosis not present

## 2021-01-09 DIAGNOSIS — J309 Allergic rhinitis, unspecified: Secondary | ICD-10-CM | POA: Diagnosis not present

## 2021-01-09 DIAGNOSIS — F17210 Nicotine dependence, cigarettes, uncomplicated: Secondary | ICD-10-CM | POA: Diagnosis not present

## 2021-01-09 DIAGNOSIS — K219 Gastro-esophageal reflux disease without esophagitis: Secondary | ICD-10-CM | POA: Diagnosis not present

## 2021-01-09 DIAGNOSIS — N400 Enlarged prostate without lower urinary tract symptoms: Secondary | ICD-10-CM | POA: Diagnosis not present

## 2021-01-09 DIAGNOSIS — E1159 Type 2 diabetes mellitus with other circulatory complications: Secondary | ICD-10-CM | POA: Diagnosis not present

## 2021-01-09 DIAGNOSIS — Z7984 Long term (current) use of oral hypoglycemic drugs: Secondary | ICD-10-CM | POA: Diagnosis not present

## 2021-01-09 DIAGNOSIS — E1169 Type 2 diabetes mellitus with other specified complication: Secondary | ICD-10-CM | POA: Diagnosis not present

## 2021-01-09 DIAGNOSIS — E1165 Type 2 diabetes mellitus with hyperglycemia: Secondary | ICD-10-CM | POA: Diagnosis not present

## 2021-01-09 DIAGNOSIS — Z9181 History of falling: Secondary | ICD-10-CM | POA: Diagnosis not present

## 2021-01-09 DIAGNOSIS — E785 Hyperlipidemia, unspecified: Secondary | ICD-10-CM | POA: Diagnosis not present

## 2021-01-09 DIAGNOSIS — Z8546 Personal history of malignant neoplasm of prostate: Secondary | ICD-10-CM | POA: Diagnosis not present

## 2021-01-09 DIAGNOSIS — D649 Anemia, unspecified: Secondary | ICD-10-CM | POA: Diagnosis not present

## 2021-01-09 DIAGNOSIS — I152 Hypertension secondary to endocrine disorders: Secondary | ICD-10-CM | POA: Diagnosis not present

## 2021-01-09 NOTE — Telephone Encounter (Signed)
Okay to give verbal orders?  ?

## 2021-01-09 NOTE — Telephone Encounter (Signed)
Home Health Verbal Orders - Caller/Agency: [facility] Number: [phone] [id number]  [id number] OT/PT/Skilled Nursing/Social Work/Speech Therapy: PT Frequency: 1w6

## 2021-01-09 NOTE — Telephone Encounter (Signed)
[facility] was given verbal orders per Jon Billings, NP for patient. [facility] verbalized understanding and has no further questions at this time.

## 2021-01-11 LAB — URINE CULTURE

## 2021-01-11 MED ORDER — NITROFURANTOIN MONOHYD MACRO 100 MG PO CAPS: 100.0000 mg | ORAL_CAPSULE | Freq: Two times a day (BID) | ORAL | 0 refills | Status: DC

## 2021-01-11 NOTE — Addendum Note (Signed)
Addended by: Jon Billings on: 01/11/2021 08:33 PM   Modules accepted: Orders

## 2021-01-11 NOTE — Progress Notes (Signed)
Please let patient know that his urine grew a bacteria.  This could be contributing to his falls and being off balanced.  I have sent him an antibiotic to the pharmacy to help take care of this.  Please make sure he picks it up.

## 2021-01-12 ENCOUNTER — Other Ambulatory Visit: Payer: Self-pay | Admitting: Family Medicine

## 2021-01-12 ENCOUNTER — Ambulatory Visit: Payer: Medicare Other

## 2021-01-12 DIAGNOSIS — E1165 Type 2 diabetes mellitus with hyperglycemia: Secondary | ICD-10-CM

## 2021-01-12 DIAGNOSIS — E1159 Type 2 diabetes mellitus with other circulatory complications: Secondary | ICD-10-CM

## 2021-01-12 DIAGNOSIS — I152 Hypertension secondary to endocrine disorders: Secondary | ICD-10-CM

## 2021-01-12 DIAGNOSIS — E785 Hyperlipidemia, unspecified: Secondary | ICD-10-CM

## 2021-01-12 DIAGNOSIS — E1169 Type 2 diabetes mellitus with other specified complication: Secondary | ICD-10-CM

## 2021-01-12 NOTE — Progress Notes (Signed)
Chronic Care Management Pharmacy Note  01/12/2021 Name:  Duane Hill. MRN:  [id number] DOB:  May 01, 1940  Summary: Last b12 in 70s 04/2020 - agreeable to starting on b12. Pharmacy would require Rx for delivery to pt's house Consider sending Vitamin B12 1000 mcg daily to pharmacy to allow home delivery due to transportation issues Consider CBC - last 02/2018. Also mentioning ongoing restless leg symptoms happening on daily basis. Pt open to lab only visit if ok Consider b12 recheck 03/2021 appt 12/2020 a1c <7%, we reviewed PAP for Trulicity and patient would likely qualify if needed in the future Consider CKD staging due to last three GFRs 40s  Could consider intensification to atorvastatin 40 mg due to ASCVD 10-yr risk >60% and DM  Subjective: Duane Hill. is an 80 y.o. year old male who is a primary patient of Jon Billings, NP.  The CCM team was consulted for assistance with disease management and care coordination needs.    Engaged with patient by telephone for initial visit in response to provider referral for pharmacy case management and/or care coordination services.   Consent to Services:  The patient was given information about Chronic Care Management services, agreed to services, and gave verbal consent prior to initiation of services.  Please see initial visit note for detailed documentation.   Patient Care Team: Jon Billings, NP as PCP - Lurline Del, MD as Consulting Physician (Urology) Vanita Ingles, RN as Case Manager ([place]) Rebekah Chesterfield, LCSW as Social Worker (Licensed Clinical Social Worker) Madelin Rear, [facility] (Pharmacist)  Hospital visits: None in previous 6 months  Objective:  Lab Results  Component Value Date   CREATININE 1.55 (H) 01/05/2021   CREATININE 1.61 (H) 10/03/2020   CREATININE 1.40 (H) 04/28/2020    Lab Results  Component Value Date   HGBA1C 6.9 (H) 01/05/2021   HGBA1C 7.5 (H) 10/03/2020    HGBA1C 7.2 (H) 04/28/2020   Last diabetic Eye exam:  Lab Results  Component Value Date/Time   HMDIABEYEEXA No Retinopathy 10/16/2019 12:00 AM   HMDIABEYEEXA No Retinopathy 10/16/2019 12:00 AM    Last diabetic Foot exam: No results found for: HMDIABFOOTEX      Component Value Date/Time   CHOL 156 01/05/2021 1533   CHOL 138 10/03/2020 1616   CHOL 141 04/28/2020 1428   CHOL 141 05/25/2016 1032   CHOL 153 05/20/2015 1056   TRIG 303 (H) 01/05/2021 1533   TRIG 402 (H) 10/03/2020 1616   TRIG 377 (H) 04/28/2020 1428   TRIG 280 (H) 05/25/2016 1032   TRIG 365 (H) 05/20/2015 1056   HDL 23 (L) 01/05/2021 1533   HDL 24 (L) 10/03/2020 1616   HDL 22 (L) 04/28/2020 1428   CHOLHDL 6.8 (H) 01/05/2021 1533   VLDL 56 (H) 05/25/2016 1032   LDLCALC 83 01/05/2021 1533   LDLCALC 53 10/03/2020 1616   LDLCALC 60 04/28/2020 1428    Hepatic Function Latest Ref Rng & Units 01/05/2021 10/03/2020 06/29/2019  Total Protein 6.0 - 8.5 g/dL 6.5 6.5 6.3  Albumin 3.7 - 4.7 g/dL 4.6 4.7 4.3  AST 0 - 40 IU/L 31 21 21   ALT 0 - 44 IU/L 30 24 19   Alk Phosphatase 44 - 121 IU/L 61 41(L) 48  Total Bilirubin 0.0 - 1.2 mg/dL 0.3 0.3 0.3    Lab Results  Component Value Date/Time   TSH 4.220 04/28/2020 02:28 PM   TSH 3.120 02/20/2018 01:17 PM    CBC Latest Ref  Rng & Units 02/20/2018 06/09/2017 11/29/2016  WBC 3.4 - 10.8 x10E3/uL 7.6 7.4 6.3  Hemoglobin 13.0 - 17.7 g/dL 11.7(L) 12.2(L) 11.8(L)  Hematocrit 37.5 - 51.0 % 36.2(L) 37.9 36.3(L)  Platelets 150 - 450 x10E3/uL 256 266 224   No results found for: VD25OH  Clinical ASCVD:  The 10-year ASCVD risk score (Arnett DK, et al., 2019) is: 67.2%   Values used to calculate the score:     Age: 40 years     Sex: Male     Is Non-Hispanic African American: No     Diabetic: Yes     Tobacco smoker: No     Systolic Blood Pressure: 952 mmHg     Is BP treated: Yes     HDL Cholesterol: 23 mg/dL     Total Cholesterol: 156 mg/dL     Social History   Tobacco Use   Smoking Status Former   Types: Cigarettes   Quit date: 11/28/1986   Years since quitting: 34.1  Smokeless Tobacco Never   BP Readings from Last 3 Encounters:  01/05/21 (!) 142/78  10/03/20 (!) 148/70  04/28/20 128/76   Pulse Readings from Last 3 Encounters:  01/05/21 (!) 55  10/03/20 (!) 52  04/28/20 (!) 54   Wt Readings from Last 3 Encounters:  01/05/21 170 lb 6.4 oz (77.3 kg)  10/03/20 167 lb 6 oz (75.9 kg)  04/28/20 172 lb (78 kg)   BMI Readings from Last 3 Encounters:  01/05/21 26.77 kg/m  10/03/20 26.33 kg/m  04/28/20 27.06 kg/m   Assessment: Review of patient past medical history, allergies, medications, health status, including review of consultants reports, laboratory and other test data, was performed as part of comprehensive evaluation and provision of chronic care management services.   SDOH:  (Social Determinants of Health) assessments and interventions performed: Yes  CCM Care Plan  Allergies  Allergen Reactions   Protonix [Pantoprazole Sodium] Nausea And Vomiting    Medications Reviewed Today     Reviewed by Madelin Rear, [facility] (Pharmacist) on 01/12/21 at 1320  Med List Status: <None>   Medication Order Taking? Sig Documenting Provider Last Dose Status Informant  acetaminophen (TYLENOL) 500 MG tablet [id number]  Take 1,000 mg by mouth every 6 (six) hours as needed. Patient uss Caplet extra strength [provider]  Active   albuterol (VENTOLIN HFA) 108 (90 Base) MCG/ACT inhaler [id number]  Take 2 puffs every 4-6 hours as necessary for shortness of breath Jon Billings, NP  Active   atorvastatin (LIPITOR) 20 MG tablet [id number] Yes Take 1 tablet (20 mg total) by mouth daily. Jon Billings, NP Taking Active   Azelastine HCl 0.15 % SOLN [id number]  Place 2 sprays into both nostrils 2 (two) times daily. Place 2 sprays into both nostrils 2 (two) times daily. Johnson, Megan P, DO  Active   carvedilol (COREG) 25 MG tablet [id number] Yes Take 1  tablet (25 mg total) by mouth 2 (two) times daily with a meal. Jon Billings, NP Taking Active   cetirizine (ZYRTEC) 10 MG tablet [id number]  Take 1 tablet (10 mg total) by mouth daily. Marnee Guarneri T, NP  Active   Choline Fenofibrate (FENOFIBRIC ACID) 135 MG CPDR [id number]  Take 1 capsule by mouth daily. Jon Billings, NP  Active   fluticasone [facility]) 50 MCG/ACT nasal spray [id number]  Place 1 spray into both nostrils 2 (two) times daily. Jon Billings, NP  Active   gabapentin (NEURONTIN) 300 MG capsule [id number]  Take 2 capsules (600 mg total)  by mouth 2 (two) times daily. Jon Billings, NP  Active   glipiZIDE (GLUCOTROL) 5 MG tablet [id number] No Take 5 mg by mouth 2 (two) times daily before a meal.  Patient not taking: Reported on 01/12/2021   [provider] Not Taking Active Self  glucose blood test strip [id number]  CHECK BLOOD SUGAR TWICE DAILY. Marnee Guarneri T, NP  Active   hydrochlorothiazide (HYDRODIURIL) 25 MG tablet [id number] Yes Take 1 tablet (25 mg total) by mouth daily. Johnson, Megan P, DO Taking Active   metFORMIN (GLUCOPHAGE-XR) 500 MG 24 hr tablet [id number] Yes Take 1 tablet (500 mg total) by mouth in the morning and at bedtime. Jon Billings, NP Taking Active   Microlet Lancets [place] [id number]  USE TWICE DAILY AS DIRECTED. Marnee Guarneri T, NP  Active   montelukast (SINGULAIR) 10 MG tablet [id number]  Take 1 tablet (10 mg total) by mouth at bedtime. [name], PA-C  Active   nitrofurantoin, macrocrystal-monohydrate, (MACROBID) 100 MG capsule [id number]  Take 1 capsule (100 mg total) by mouth 2 (two) times daily. Jon Billings, NP  Active   omeprazole (PRILOSEC) 40 MG capsule [id number]  Take 1 capsule (40 mg total) by mouth daily. Jon Billings, NP  Active   quinapril (ACCUPRIL) 40 MG tablet [id number] Yes Take 1 tablet (40 mg total) by mouth daily. Jon Billings, NP Taking Active            Patient Active Problem List    Diagnosis Date Noted   B12 deficiency 04/29/2020   HSV (herpes simplex virus) infection 05/10/2019   Allergic rhinitis 02/14/2019   Advanced care planning/counseling discussion 11/29/2016   GERD (gastroesophageal reflux disease) 11/05/2014   Hyperlipidemia associated with type 2 diabetes mellitus ([place]) 11/05/2014   Chronic radiation cystitis 11/05/2014   Hypertension associated with diabetes ([place]) 02/02/2013   Type 2 diabetes mellitus with hyperglycemia ([place]) 02/02/2013   History of prostate cancer 02/02/2013    Immunization History  Administered Date(s) Administered   Fluad Quad(high Dose 65+) 02/14/2019, 04/28/2020, 01/05/2021   Influenza, High Dose Seasonal PF 02/24/2016, 01/25/2017, 04/13/2018   Influenza,inj,Quad PF,6+ Mos 02/05/2015   Pneumococcal Conjugate-13 10/18/2013   Pneumococcal Polysaccharide-23 02/24/2016   Pneumococcal-Unspecified 01/06/2009   Tdap 07/20/2011   Zoster, Live 01/07/2009   Conditions to be addressed/monitored: T2DM, HLD, HTN, B12 deficiency noted 04/28/2020  Care Plan : ccm pharmacy care plan  Updates made by Madelin Rear, [place] since 01/12/2021 12:00 AM     Problem: CHL AMB "PATIENT-SPECIFIC PROBLEM"      Long-Range Goal: Patient-Specific Goal   Start Date: 01/12/2022  This Visit's Progress: On track  Priority: High  Note:   Current Barriers:  Unable to maintain control of T2DM  Pharmacist Clinical Goal(s):  Patient will verbalize ability to afford treatment regimen achieve adherence to monitoring guidelines and medication adherence to achieve therapeutic efficacy maintain control of T2DM as evidenced by BG and a1c<7%  through collaboration with PharmD and provider.   Interventions: 1:1 collaboration with Jon Billings, NP regarding development and update of comprehensive plan of care as evidenced by provider attestation and co-signature Inter-disciplinary care team collaboration (see longitudinal plan of care) Comprehensive medication  review performed; medication list updated in electronic medical record  Hypertension (BP goal <140/90) -Not ideally controlled -Fall Hx -GFRs in 40s  -Current treatment: Carvedilol 25 mg twice daily  HCTZ 25 mg once daily  Quinapril 40 mg once daily  -Medications previously tried: n/a  -Current home readings: machine broke -Denies hypotensive/hypertensive symptoms -  Educated on BP goals and benefits of medications for prevention of heart attack, stroke and kidney damage; -Counseled to monitor BP at home 1-2x/wk, document, and provide log at future appointments -Counseled on diet and exercise extensively Recommended to continue current medication -Using Iibuprofen - recently 400 mg 7 day per week -drinks white wine 3-4 glasses throughout the day  Hyperlipidemia: (LDL goal < 100) -Controlled -Current treatment: Atorvastatin 20 mg once daily  Choline fenofibrate 135 mg once daily  -Medications previously tried: Simvastatin 40 mg once daily.   -Educated on Cholesterol goals;  Benefits of statin for ASCVD risk reduction; Could consider intensification to atorvastatin 40 mg due to ASCVD 10-yr risk >60% and DM -Counseled on diet and exercise extensively Recommended to continue current medication  Diabetes (A1c goal <7%) -Not ideally controlled, now improved to 6.9% on 90/9030 - GLP-1 (trulicity) to be considered if above goal at f/u -GFR 40s -B12 of 77 04/2020 -Current medications: Metformin XR 500 mg BID -Medications previously tried: Higher dose metformin (GI complaints), SGLT2 (infxn)  -Current home glucose readings fasting glucose: 153, 130s typically.  post prandial glucose: not testing. -Denies hypoglycemic/hyperglycemic symptoms -Current meal patterns: country ham, likes salty food. Not a lot sweets. Cut down on bread considerably. Really likes greens but accessibility issues. -Current exercise: limited, home health PT. -Educated on A1c and blood sugar goals; Benefits of  routine self-monitoring of blood sugar; -Counseled to check feet daily and get yearly eye exams -Recommended to continue current medication Assessed patient finances. Trulicity PAP - based on income reported would qualify for assistance if needed. Open to giving himself once weekly injection if needed.  -Could review for CKD staging due to-GFRs in 40s 01/05/2021, 10/03/2020, 04/28/2020  Patient Goals/Self-Care Activities Patient will:   - take medications as prescribed check glucose daily, document, and provide at future appointments  Medication Assistance: Application for [place]  medication assistance program. in process.  Anticipated assistance start date TBD based on patient need and f/u a1c%.  See plan of care for additional detail.    Patient's preferred pharmacy is:  [place], [place] - [address] [place] [address] Phone: [phone] Fax: [phone]  Pt endorses 100% compliance  Follow Up:  Patient agrees to Care Plan and Follow-up. Plan: HC - 2 month DM. Pharmacist 3-4 month f/u telephone visit  Future Appointments  Date Time Provider [place]  01/15/2021  3:30 PM CFP CCM SOCIAL WORK [facility]  03/03/2021  2:30 PM CFP CCM CASE MANAGER [facility]  04/06/2021  1:20 PM Jon Billings, NP [facility] P[facility]  04/27/2021  3:00 PM CFP CCM PHARMACY [facility] P[facility]   Madelin Rear, PharmD, BCGP Clinical Pharmacist  [phone]

## 2021-01-12 NOTE — Patient Instructions (Signed)
Duane Hill,  Thank you for talking with me today. I have included our care plan/goals in the following pages.   Please review and call me at [phone] with any questions.  Thanks! Ellin Mayhew, PharmD Clinical Pharmacist  [phone]  Care Plan : ccm pharmacy care plan  Updates made by Madelin Rear, [facility] since 01/12/2021 12:00 AM     Problem: CHL AMB "PATIENT-SPECIFIC PROBLEM"      Long-Range Goal: Patient-Specific Goal   Start Date: 01/12/2022  This Visit's Progress: On track  Priority: High  Note:   Current Barriers:  Unable to maintain control of T2DM  Pharmacist Clinical Goal(s):  Patient will verbalize ability to afford treatment regimen achieve adherence to monitoring guidelines and medication adherence to achieve therapeutic efficacy maintain control of T2DM as evidenced by BG and a1c<7%  through collaboration with PharmD and provider.   Interventions: 1:1 collaboration with Jon Billings, NP regarding development and update of comprehensive plan of care as evidenced by provider attestation and co-signature Inter-disciplinary care team collaboration (see longitudinal plan of care) Comprehensive medication review performed; medication list updated in electronic medical record  Hypertension (BP goal <140/90) -Not ideally controlled -Fall Hx -GFRs in 40s  -Current treatment: Carvedilol 25 mg twice daily  HCTZ 25 mg once daily  Quinapril 40 mg once daily  -Medications previously tried: n/a  -Current home readings: machine broke -Denies hypotensive/hypertensive symptoms -Educated on BP goals and benefits of medications for prevention of heart attack, stroke and kidney damage; -Counseled to monitor BP at home 1-2x/wk, document, and provide log at future appointments -Counseled on diet and exercise extensively Recommended to continue current medication -Using Iibuprofen - recently 400 mg 7 day per week -drinks white wine 3-4 glasses throughout  the day  Hyperlipidemia: (LDL goal < 100) -Controlled -Current treatment: Atorvastatin 20 mg once daily  Choline fenofibrate 135 mg once daily  -Medications previously tried: Simvastatin 40 mg once daily.   -Educated on Cholesterol goals;  Benefits of statin for ASCVD risk reduction; Could consider intensification to atorvastatin 40 mg due to ASCVD 10-yr risk >60% and DM -Counseled on diet and exercise extensively Recommended to continue current medication  Diabetes (A1c goal <7%) -Not ideally controlled, now improved to 6.9% on 22/2979 - GLP-1 (trulicity) to be considered if above goal at f/u -GFR 40s -B12 of 77 04/2020 -Current medications: Metformin XR 500 mg BID -Medications previously tried: Higher dose metformin (GI complaints), SGLT2 (infxn)  -Current home glucose readings fasting glucose: 153, 130s typically.  post prandial glucose: not testing. -Denies hypoglycemic/hyperglycemic symptoms -Current meal patterns: country ham, likes salty food. Not a lot sweets. Cut down on bread considerably. Really likes greens but accessibility issues. -Current exercise: limited, home health PT. -Educated on A1c and blood sugar goals; Benefits of routine self-monitoring of blood sugar; -Counseled to check feet daily and get yearly eye exams -Recommended to continue current medication Assessed patient finances. [place] PAP - based on income reported would qualify for assistance if needed. Open to giving himself once weekly injection if needed.  -Could review for CKD staging due to-GFRs in 40s 01/05/2021, 10/03/2020, 04/28/2020  Patient Goals/Self-Care Activities Patient will:   - take medications as prescribed check glucose daily, document, and provide at future appointments  Medication Assistance: Application for [place]  medication assistance program. in process.  Anticipated assistance start date TBD based on patient need and f/u a1c%.  See plan of care for additional detail.   The  patient verbalized understanding  of instructions provided today and agreed to receive a MyChart copy of patient instruction and/or educational materials. Telephone follow up appointment with pharmacy team member scheduled for: See next appointment with "Care Management Staff" under "What's Next" below.  Diabetes Mellitus and Nutrition, Adult When you have diabetes, or diabetes mellitus, it is very important to have healthy eating habits because your blood sugar (glucose) levels are greatly affected by what you eat and drink. Eating healthy foods in the right amounts, at about the same times every day, can help you: Control your blood glucose. Lower your risk of heart disease. Improve your blood pressure. Reach or maintain a healthy weight. What can affect my meal plan? Every person with diabetes is different, and each person has different needs for a meal plan. Your health care provider may recommend that you work with a dietitian to make a meal plan that is best for you. Your meal plan may vary depending on factors such as: The calories you need. The medicines you take. Your weight. Your blood glucose, blood pressure, and cholesterol levels. Your activity level. Other health conditions you have, such as heart or kidney disease. How do carbohydrates affect me? Carbohydrates, also called carbs, affect your blood glucose level more than any other type of food. Eating carbs naturally raises the amount of glucose in your blood. Carb counting is a method for keeping track of how many carbs you eat. Counting carbs is important to keep your blood glucose at a healthy level, especially if you use insulin or take certain oral diabetes medicines. It is important to know how many carbs you can safely have in each meal. This is different for every person. Your dietitian can help you calculate how many carbs you should have at each meal and for each snack. How does alcohol affect me? Alcohol can cause a sudden  decrease in blood glucose (hypoglycemia), especially if you use insulin or take certain oral diabetes medicines. Hypoglycemia can be a life-threatening condition. Symptoms of hypoglycemia, such as sleepiness, dizziness, and confusion, are similar to symptoms of having too much alcohol. Do not drink alcohol if: Your health care provider tells you not to drink. You are pregnant, may be pregnant, or are planning to become pregnant. If you drink alcohol: Do not drink on an empty stomach. Limit how much you use to: 0-1 drink a day for women. 0-2 drinks a day for men. Be aware of how much alcohol is in your drink. In the [place] one drink equals one 12 oz bottle of beer (355 mL), one 5 oz glass of wine (148 mL), or one 1 oz glass of hard liquor (44 mL). Keep yourself hydrated with water, diet soda, or unsweetened iced tea. Keep in mind that regular soda, juice, and other mixers may contain a lot of sugar and must be counted as carbs. What are tips for following this plan? Reading food labels Start by checking the serving size on the "Nutrition Facts" label of packaged foods and drinks. The amount of calories, carbs, fats, and other nutrients listed on the label is based on one serving of the item. Many items contain more than one serving per package. Check the total grams (g) of carbs in one serving. You can calculate the number of servings of carbs in one serving by dividing the total carbs by 15. For example, if a food has 30 g of total carbs per serving, it would be equal to 2 servings of carbs. Check the number of  grams (g) of saturated fats and trans fats in one serving. Choose foods that have a low amount or none of these fats. Check the number of milligrams (mg) of salt (sodium) in one serving. Most people should limit total sodium intake to less than 2,300 mg per day. Always check the nutrition information of foods labeled as "low-fat" or "nonfat." These foods may be higher in added sugar or  refined carbs and should be avoided. Talk to your dietitian to identify your daily goals for nutrients listed on the label. Shopping Avoid buying canned, pre-made, or processed foods. These foods tend to be high in fat, sodium, and added sugar. Shop around the outside edge of the grocery store. This is where you will most often find fresh fruits and vegetables, bulk grains, fresh meats, and fresh dairy. Cooking Use low-heat cooking methods, such as baking, instead of high-heat cooking methods like deep frying. Cook using healthy oils, such as olive, canola, or sunflower oil. Avoid cooking with butter, cream, or high-fat meats. Meal planning Eat meals and snacks regularly, preferably at the same times every day. Avoid going long periods of time without eating. Eat foods that are high in fiber, such as fresh fruits, vegetables, beans, and whole grains. Talk with your dietitian about how many servings of carbs you can eat at each meal. Eat 4-6 oz (112-168 g) of lean protein each day, such as lean meat, chicken, fish, eggs, or tofu. One ounce (oz) of lean protein is equal to: 1 oz (28 g) of meat, chicken, or fish. 1 egg.  cup (62 g) of tofu. Eat some foods each day that contain healthy fats, such as avocado, nuts, seeds, and fish. What foods should I eat? Fruits Berries. Apples. Oranges. Peaches. Apricots. Plums. Grapes. Mango. Papaya. Pomegranate. Kiwi. Cherries. Vegetables Lettuce. Spinach. Leafy greens, including kale, chard, collard greens, and mustard greens. Beets. Cauliflower. Cabbage. Broccoli. Carrots. Green beans. Tomatoes. Peppers. Onions. Cucumbers. Brussels sprouts. Grains Whole grains, such as whole-wheat or whole-grain bread, crackers, tortillas, cereal, and pasta. Unsweetened oatmeal. Quinoa. Brown or wild rice. Meats and other proteins Seafood. Poultry without skin. Lean cuts of poultry and beef. Tofu. Nuts. Seeds. Dairy Low-fat or fat-free dairy products such as milk,  yogurt, and cheese. The items listed above may not be a complete list of foods and beverages you can eat. Contact a dietitian for more information. What foods should I avoid? Fruits Fruits canned with syrup. Vegetables Canned vegetables. Frozen vegetables with butter or cream sauce. Grains Refined white flour and flour products such as bread, pasta, snack foods, and cereals. Avoid all processed foods. Meats and other proteins Fatty cuts of meat. Poultry with skin. Breaded or fried meats. Processed meat. Avoid saturated fats. Dairy Full-fat yogurt, cheese, or milk. Beverages Sweetened drinks, such as soda or iced tea. The items listed above may not be a complete list of foods and beverages you should avoid. Contact a dietitian for more information. Questions to ask a health care provider Do I need to meet with a diabetes educator? Do I need to meet with a dietitian? What number can I call if I have questions? When are the best times to check my blood glucose? Where to find more information: American Diabetes Association: diabetes.org Academy of Nutrition and Dietetics: www.eatright.[employer] of Diabetes and Digestive and Kidney Diseases: [employer] Association of Diabetes Care and Education Specialists: www.diabeteseducator.org Summary It is important to have healthy eating habits because your blood sugar (glucose) levels are greatly affected by  what you eat and drink. A healthy meal plan will help you control your blood glucose and maintain a healthy lifestyle. Your health care provider may recommend that you work with a dietitian to make a meal plan that is best for you. Keep in mind that carbohydrates (carbs) and alcohol have immediate effects on your blood glucose levels. It is important to count carbs and to use alcohol carefully. This information is not intended to replace advice given to you by your health care provider. Make sure you discuss any questions you  have with your health care provider. Document Revised: 03/13/2019 Document Reviewed: 03/13/2019 Elsevier Patient Education  2021 [employer].

## 2021-01-14 DIAGNOSIS — E1165 Type 2 diabetes mellitus with hyperglycemia: Secondary | ICD-10-CM | POA: Diagnosis not present

## 2021-01-14 DIAGNOSIS — E785 Hyperlipidemia, unspecified: Secondary | ICD-10-CM | POA: Diagnosis not present

## 2021-01-14 DIAGNOSIS — E1169 Type 2 diabetes mellitus with other specified complication: Secondary | ICD-10-CM | POA: Diagnosis not present

## 2021-01-14 DIAGNOSIS — E1159 Type 2 diabetes mellitus with other circulatory complications: Secondary | ICD-10-CM | POA: Diagnosis not present

## 2021-01-14 DIAGNOSIS — I152 Hypertension secondary to endocrine disorders: Secondary | ICD-10-CM | POA: Diagnosis not present

## 2021-01-14 DIAGNOSIS — D649 Anemia, unspecified: Secondary | ICD-10-CM | POA: Diagnosis not present

## 2021-01-15 ENCOUNTER — Ambulatory Visit: Payer: Medicare Other | Admitting: Licensed Clinical Social Worker

## 2021-01-15 DIAGNOSIS — E1159 Type 2 diabetes mellitus with other circulatory complications: Secondary | ICD-10-CM

## 2021-01-15 DIAGNOSIS — E1169 Type 2 diabetes mellitus with other specified complication: Secondary | ICD-10-CM

## 2021-01-15 DIAGNOSIS — E1165 Type 2 diabetes mellitus with hyperglycemia: Secondary | ICD-10-CM

## 2021-01-15 DIAGNOSIS — W19XXXD Unspecified fall, subsequent encounter: Secondary | ICD-10-CM

## 2021-01-15 DIAGNOSIS — I152 Hypertension secondary to endocrine disorders: Secondary | ICD-10-CM

## 2021-01-16 DIAGNOSIS — E1169 Type 2 diabetes mellitus with other specified complication: Secondary | ICD-10-CM | POA: Diagnosis not present

## 2021-01-16 DIAGNOSIS — E1159 Type 2 diabetes mellitus with other circulatory complications: Secondary | ICD-10-CM

## 2021-01-16 DIAGNOSIS — E785 Hyperlipidemia, unspecified: Secondary | ICD-10-CM | POA: Diagnosis not present

## 2021-01-16 DIAGNOSIS — I152 Hypertension secondary to endocrine disorders: Secondary | ICD-10-CM

## 2021-01-16 DIAGNOSIS — E1165 Type 2 diabetes mellitus with hyperglycemia: Secondary | ICD-10-CM

## 2021-01-19 NOTE — Chronic Care Management (AMB) (Signed)
Chronic Care Management    Clinical Social Work Note  01/19/2021 Name: Duane Hill. MRN: [id number] DOB: January 29, 1941  Duane Hill. is a 80 y.o. year old male who is a primary care patient of Jon Billings, NP. The CCM team was consulted to assist the patient with chronic disease management and/or care coordination needs related to: [employer] .   Engaged with patient by telephone for follow up visit in response to provider referral for social work chronic care management and care coordination services.   Consent to Services:  The patient was given information about Chronic Care Management services, agreed to services, and gave verbal consent prior to initiation of services.  Please see initial visit note for detailed documentation.   Patient agreed to services and consent obtained.   Consent to Services:  The patient was given information about Care Management services, agreed to services, and gave verbal consent prior to initiation of services.  Please see initial visit note for detailed documentation.   Patient agreed to services today and consent obtained.   Assessment: Engaged with patient by phone in response to provider referral for social work care coordination services: [employer] .    Patient continues to maintain positive progress with care plan goals. Patient has modified diet to manage health conditions and is now utilizing cane to promote balance to reduce risk of falling. Patient has been referred to [facility] to medical appts. See Care Plan below for interventions and patient self-care activities.  Recent life changes or stressors: Balance   Recommendation: Patient may benefit from, and is in agreement work with LCSW to address care coordination needs and will continue to work with the clinical team to address health care and disease management related needs.   Follow up Plan: Patient would like continued follow-up from CCM LCSW  .  per patient's request will follow up in 04/02/21.  Will call office if needed prior to next encounter.  SDOH (Social Determinants of Health) assessments and interventions performed:  NA  Advanced Directives Status: Not addressed in this encounter.  CCM Care Plan  Allergies  Allergen Reactions   Protonix [Pantoprazole Sodium] Nausea And Vomiting    Outpatient Encounter Medications as of 01/15/2021  Medication Sig   acetaminophen (TYLENOL) 500 MG tablet Take 1,000 mg by mouth every 6 (six) hours as needed. Patient uss Caplet extra strength   albuterol (VENTOLIN HFA) 108 (90 Base) MCG/ACT inhaler Take 2 puffs every 4-6 hours as necessary for shortness of breath   atorvastatin (LIPITOR) 20 MG tablet Take 1 tablet (20 mg total) by mouth daily.   Azelastine HCl 0.15 % SOLN Place 2 sprays into both nostrils 2 (two) times daily. Place 2 sprays into both nostrils 2 (two) times daily.   carvedilol (COREG) 25 MG tablet Take 1 tablet (25 mg total) by mouth 2 (two) times daily with a meal.   cetirizine (ZYRTEC) 10 MG tablet Take 1 tablet (10 mg total) by mouth daily.   Choline Fenofibrate (FENOFIBRIC ACID) 135 MG CPDR Take 1 capsule by mouth daily.   fluticasone (FLONASE) 50 MCG/ACT nasal spray Place 1 spray into both nostrils 2 (two) times daily.   gabapentin (NEURONTIN) 300 MG capsule Take 2 capsules (600 mg total) by mouth 2 (two) times daily.   glipiZIDE (GLUCOTROL) 5 MG tablet Take 5 mg by mouth 2 (two) times daily before a meal. (Patient not taking: Reported on 01/12/2021)   glucose blood test strip CHECK BLOOD SUGAR TWICE  DAILY.   hydrochlorothiazide (HYDRODIURIL) 25 MG tablet Take 1 tablet (25 mg total) by mouth daily.   metFORMIN (GLUCOPHAGE-XR) 500 MG 24 hr tablet Take 1 tablet (500 mg total) by mouth in the morning and at bedtime.   Microlet Lancets MISC USE TWICE DAILY AS DIRECTED.   montelukast (SINGULAIR) 10 MG tablet Take 1 tablet (10 mg total) by mouth at bedtime.   nitrofurantoin,  macrocrystal-monohydrate, (MACROBID) 100 MG capsule Take 1 capsule (100 mg total) by mouth 2 (two) times daily.   omeprazole (PRILOSEC) 40 MG capsule Take 1 capsule (40 mg total) by mouth daily.   quinapril (ACCUPRIL) 40 MG tablet Take 1 tablet (40 mg total) by mouth daily.   No facility-administered encounter medications on file as of 01/15/2021.    Patient Active Problem List   Diagnosis Date Noted   B12 deficiency 04/29/2020   HSV (herpes simplex virus) infection 05/10/2019   Allergic rhinitis 02/14/2019   Advanced care planning/counseling discussion 11/29/2016   GERD (gastroesophageal reflux disease) 11/05/2014   Hyperlipidemia associated with type 2 diabetes mellitus ([place]) 11/05/2014   Chronic radiation cystitis 11/05/2014   Hypertension associated with diabetes ([place]) 02/02/2013   Type 2 diabetes mellitus with hyperglycemia ([place]) 02/02/2013   History of prostate cancer 02/02/2013    Conditions to be addressed/monitored: ; Limited social support and Transportation  Care Plan : General Social Work (Adult)  Updates made by Rebekah Chesterfield, LCSW since 01/19/2021 12:00 AM     Problem: Quality of Life (General Plan of Care)      Goal: Quality of Life Maintained   Start Date: 11/06/2020  Recent Progress: On track  Priority: Medium  Note:   Current barriers:   Acute Mental Health needs related to Stress Limited social support, Transportation, and Family and relationship dysfunction Needs Support, Education, and Care Coordination in order to meet unmet mental health needs. Clinical Goal(s): Over the next 120 days, patient will work with SW, counselor and therapist to reduce or manage symptoms of agitation, mood instability, stress, and bipolar until connected for ongoing counseling. Clinical Interventions:  Assessed patient's previous and current treatment, coping skills, support system and barriers to care  Patient interviewed and appropriate assessments performed Patient  reports ongoing leg weakness is "about the same" Patient is unsure if it's because of scoliosis (diagnosed at 80 yo) or if its because of his age. Patient reports back pain. He denies any strenuous activities or heavy lifting. Patient reports that he doesn't have great balance anymore. CCM LCSW discussed strategies that patient can utilize to prevent risk of falling and promote safety. 09/29: Patient reports recent fall at the beginning of the month. He continues to utilize a cane to assist with balance and prevent fall risk Patient is participating in physical therapy Patient has modified his diet to assist with management of diabetes. He has equipment at home to check blood sugars when needed CCM LCSW discussed strategies to assist in management of diabetes. Patient is motivated because, "I don't want to lose any limbs" Patient reports that he doesnt check readings often, only check it when feels like something is wrong. Patient agreed to increase the amount of times he checks his blood sugar to prevent episodes of hypoglycemia 09/29: Reports excitement that his A1C decreased at previous appt with PCP Patient reports limited support. He does not speak with his two daughters and his son has not contacted him after re-locating out of state. Patient's oldest sister resides next door and he speaks with  neighbors occasionally Patient receives emotional support from his cats (5-6) and his lab dog, Cooper CCM LCSW discussed benefits of implementing self-care in his daily routine. Strategies were identified Patient utilizes a calendar and MyChart to keep up with appointments Patient will utilize [employer] to appointments Solution-Focused Strategies, Mindfulness or Psychologist, educational, Active listening / Reflection utilized , Emotional Supportive Provided, Psychoeducation for mental health needs , Brief CBT , and Verbalization of feelings encouraged  ; Discussed plans with patient for ongoing care  management follow up and provided patient with direct contact information for care management team Collaboration with PCP regarding development and update of comprehensive plan of care as evidenced by provider attestation and co-signature Inter-disciplinary care team collaboration (see longitudinal plan of care) Patient Goals/Self-Care Activities: Over the next 120 days Utilize healthy coping skills and self-care strategies discussed Continue compliance with medications Attend all scheduled appointments with providers Contact clinic with any questions or concerns        Christa See, MSW, [email or web address] Phone [phone] 9:14 AM

## 2021-01-19 NOTE — Patient Instructions (Signed)
Visit Information   Goals Addressed             This Visit's Progress    Management of Stress   On track    Timeframe:  Long-Range Goal Priority:  Medium Start Date:  11/06/20                           Expected End Date: 04/18/21                    Follow Up Date 04/02/21    Patient Goals/Self-Care Activities: Over the next 120 days Utilize healthy coping skills and self-care strategies discussed Continue compliance with medications Attend all scheduled appointments with providers Contact clinic with any questions or concerns        Patient verbalizes understanding of instructions provided today and agrees to view in [place].   Telephone follow up appointment with care management team member scheduled for:04/02/21  Christa See, MSW, [email or web address] Phone [phone] 9:18 AM

## 2021-01-21 DIAGNOSIS — E1159 Type 2 diabetes mellitus with other circulatory complications: Secondary | ICD-10-CM | POA: Diagnosis not present

## 2021-01-21 DIAGNOSIS — E1169 Type 2 diabetes mellitus with other specified complication: Secondary | ICD-10-CM | POA: Diagnosis not present

## 2021-01-21 DIAGNOSIS — D649 Anemia, unspecified: Secondary | ICD-10-CM | POA: Diagnosis not present

## 2021-01-21 DIAGNOSIS — E785 Hyperlipidemia, unspecified: Secondary | ICD-10-CM | POA: Diagnosis not present

## 2021-01-21 DIAGNOSIS — I152 Hypertension secondary to endocrine disorders: Secondary | ICD-10-CM | POA: Diagnosis not present

## 2021-01-21 DIAGNOSIS — E1165 Type 2 diabetes mellitus with hyperglycemia: Secondary | ICD-10-CM | POA: Diagnosis not present

## 2021-01-28 ENCOUNTER — Other Ambulatory Visit: Payer: Self-pay | Admitting: Nurse Practitioner

## 2021-01-28 DIAGNOSIS — E1169 Type 2 diabetes mellitus with other specified complication: Secondary | ICD-10-CM | POA: Diagnosis not present

## 2021-01-28 DIAGNOSIS — I152 Hypertension secondary to endocrine disorders: Secondary | ICD-10-CM | POA: Diagnosis not present

## 2021-01-28 DIAGNOSIS — D649 Anemia, unspecified: Secondary | ICD-10-CM | POA: Diagnosis not present

## 2021-01-28 DIAGNOSIS — E785 Hyperlipidemia, unspecified: Secondary | ICD-10-CM

## 2021-01-28 DIAGNOSIS — E1165 Type 2 diabetes mellitus with hyperglycemia: Secondary | ICD-10-CM | POA: Diagnosis not present

## 2021-01-28 DIAGNOSIS — E1159 Type 2 diabetes mellitus with other circulatory complications: Secondary | ICD-10-CM | POA: Diagnosis not present

## 2021-01-28 NOTE — Telephone Encounter (Signed)
Requested Prescriptions  Pending Prescriptions Disp Refills  . Choline Fenofibrate (FENOFIBRIC ACID) 135 MG CPDR [Pharmacy Med Name: FENOFIBRIC ACID DR 135 MG CAP] 90 capsule 1    Sig: Take 1 capsule by mouth daily.     Cardiovascular:  Antilipid - Fibric Acid Derivatives Failed - 01/28/2021 10:47 AM      Failed - HDL in normal range and within 360 days    HDL  Date Value Ref Range Status  01/05/2021 23 (L) >39 mg/dL Final         Failed - Triglycerides in normal range and within 360 days    Triglycerides  Date Value Ref Range Status  01/05/2021 303 (H) 0 - 149 mg/dL Final   Triglycerides Piccolo,Waived  Date Value Ref Range Status  05/25/2016 280 (H) <150 mg/dL Final    Comment:                            Normal                   <150                         Borderline High     150 - 199                         High                200 - 499                         Very High                >499          Failed - Cr in normal range and within 180 days    Creatinine  Date Value Ref Range Status  08/16/2014 1.11 mg/dL Final    Comment:    0.61-1.24 NOTE: New Reference Range  06/25/14    Creatinine, Ser  Date Value Ref Range Status  01/05/2021 1.55 (H) 0.76 - 1.27 mg/dL Final         Failed - eGFR in normal range and within 180 days    EGFR (African American)  Date Value Ref Range Status  08/16/2014 >60  Final   GFR calc Af Amer  Date Value Ref Range Status  04/28/2020 56 (L) >59 mL/min/1.73 Final    Comment:    **In accordance with recommendations from the NKF-ASN Task force,**   [facility] is in the process of updating its eGFR calculation to the   2021 CKD-EPI creatinine equation that estimates kidney function   without a race variable.    EGFR (Non-African Amer.)  Date Value Ref Range Status  08/16/2014 >60  Final    Comment:    eGFR values <43mL/min/1.73 m2 may be an indication of chronic kidney disease (CKD). Calculated eGFR is useful in patients with  stable renal function. The eGFR calculation will not be reliable in acutely ill patients when serum creatinine is changing rapidly. It is not useful in patients on dialysis. The eGFR calculation may not be applicable to patients at the low and high extremes of body sizes, pregnant women, and vegetarians.    GFR calc non Af Amer  Date Value Ref Range Status  04/28/2020 48 (L) >59 mL/min/1.73 Final   eGFR  Date Value Ref  Range Status  01/05/2021 46 (L) >59 mL/min/1.73 Final         Passed - Total Cholesterol in normal range and within 360 days    Cholesterol, Total  Date Value Ref Range Status  01/05/2021 156 100 - 199 mg/dL Final   Cholesterol Piccolo, Waived  Date Value Ref Range Status  05/25/2016 141 <200 mg/dL Final    Comment:                            Desirable                <200                         Borderline High      200- 239                         High                     >239          Passed - LDL in normal range and within 360 days    LDL Chol Calc (NIH)  Date Value Ref Range Status  01/05/2021 83 0 - 99 mg/dL Final         Passed - ALT in normal range and within 180 days    ALT  Date Value Ref Range Status  01/05/2021 30 0 - 44 IU/L Final   ALT (SGPT) Piccolo, Waived  Date Value Ref Range Status  05/25/2016 31 10 - 47 U/L Final         Passed - AST in normal range and within 180 days    AST  Date Value Ref Range Status  01/05/2021 31 0 - 40 IU/L Final   AST (SGOT) Piccolo, Waived  Date Value Ref Range Status  05/25/2016 27 11 - 38 U/L Final         Passed - Valid encounter within last 12 months    Recent Outpatient Visits          3 weeks ago Hypertension associated with diabetes ([place])   [facility] Jon Billings, NP   3 months ago Hypertension associated with diabetes ([place])   [facility] Jon Billings, NP   9 months ago Type 2 diabetes mellitus with hyperglycemia, without long-term current use of  insulin ([place])   [place], [place] T, NP   1 year ago Type 2 diabetes mellitus with hyperglycemia, without long-term current use of insulin ([place])   [place], Megan P, DO   1 year ago Type 2 diabetes mellitus with hyperglycemia, without long-term current use of insulin [facility])   [place], Lilia Argue, [place]      Future Appointments            In 2 months Jon Billings, NP [facility], [place]           . fluticasone (FLONASE) 50 MCG/ACT nasal spray [Pharmacy Med Name: FLUTICASONE PROP 50 MCG SPRAY] 16 g 2    Sig: Place 1 spray into both nostrils 2 (two) times daily.     Ear, Nose, and Throat: Nasal Preparations - Corticosteroids Passed - 01/28/2021 10:47 AM      Passed - Valid encounter within last 12 months    Recent Outpatient Visits  3 weeks ago Hypertension associated with [facility])   [facility] Jon Billings, NP   3 months ago Hypertension associated with diabetes [facility])   [facility] Jon Billings, NP   9 months ago Type 2 diabetes mellitus with hyperglycemia, without long-term current use of insulin ([place])   [place], [place] T, NP   1 year ago Type 2 diabetes mellitus with hyperglycemia, without long-term current use of insulin ([place])   [place], Megan P, DO   1 year ago Type 2 diabetes mellitus with hyperglycemia, without long-term current use of insulin [facility])   [place], Lilia Argue, [place]      Future Appointments            In 2 months Jon Billings, NP [facility], [place]

## 2021-02-02 DIAGNOSIS — D649 Anemia, unspecified: Secondary | ICD-10-CM | POA: Diagnosis not present

## 2021-02-02 DIAGNOSIS — E1169 Type 2 diabetes mellitus with other specified complication: Secondary | ICD-10-CM | POA: Diagnosis not present

## 2021-02-02 DIAGNOSIS — E785 Hyperlipidemia, unspecified: Secondary | ICD-10-CM | POA: Diagnosis not present

## 2021-02-02 DIAGNOSIS — E1165 Type 2 diabetes mellitus with hyperglycemia: Secondary | ICD-10-CM | POA: Diagnosis not present

## 2021-02-02 DIAGNOSIS — E1159 Type 2 diabetes mellitus with other circulatory complications: Secondary | ICD-10-CM | POA: Diagnosis not present

## 2021-02-02 DIAGNOSIS — I152 Hypertension secondary to endocrine disorders: Secondary | ICD-10-CM | POA: Diagnosis not present

## 2021-02-08 DIAGNOSIS — K219 Gastro-esophageal reflux disease without esophagitis: Secondary | ICD-10-CM | POA: Diagnosis not present

## 2021-02-08 DIAGNOSIS — Z8546 Personal history of malignant neoplasm of prostate: Secondary | ICD-10-CM | POA: Diagnosis not present

## 2021-02-08 DIAGNOSIS — I152 Hypertension secondary to endocrine disorders: Secondary | ICD-10-CM | POA: Diagnosis not present

## 2021-02-08 DIAGNOSIS — F17210 Nicotine dependence, cigarettes, uncomplicated: Secondary | ICD-10-CM | POA: Diagnosis not present

## 2021-02-08 DIAGNOSIS — E785 Hyperlipidemia, unspecified: Secondary | ICD-10-CM | POA: Diagnosis not present

## 2021-02-08 DIAGNOSIS — Z7984 Long term (current) use of oral hypoglycemic drugs: Secondary | ICD-10-CM | POA: Diagnosis not present

## 2021-02-08 DIAGNOSIS — E1169 Type 2 diabetes mellitus with other specified complication: Secondary | ICD-10-CM | POA: Diagnosis not present

## 2021-02-08 DIAGNOSIS — N400 Enlarged prostate without lower urinary tract symptoms: Secondary | ICD-10-CM | POA: Diagnosis not present

## 2021-02-08 DIAGNOSIS — D649 Anemia, unspecified: Secondary | ICD-10-CM | POA: Diagnosis not present

## 2021-02-08 DIAGNOSIS — E1165 Type 2 diabetes mellitus with hyperglycemia: Secondary | ICD-10-CM | POA: Diagnosis not present

## 2021-02-08 DIAGNOSIS — Z9181 History of falling: Secondary | ICD-10-CM | POA: Diagnosis not present

## 2021-02-08 DIAGNOSIS — E538 Deficiency of other specified B group vitamins: Secondary | ICD-10-CM | POA: Diagnosis not present

## 2021-02-08 DIAGNOSIS — J309 Allergic rhinitis, unspecified: Secondary | ICD-10-CM | POA: Diagnosis not present

## 2021-02-08 DIAGNOSIS — E1159 Type 2 diabetes mellitus with other circulatory complications: Secondary | ICD-10-CM | POA: Diagnosis not present

## 2021-02-09 DIAGNOSIS — E1169 Type 2 diabetes mellitus with other specified complication: Secondary | ICD-10-CM | POA: Diagnosis not present

## 2021-02-09 DIAGNOSIS — E1165 Type 2 diabetes mellitus with hyperglycemia: Secondary | ICD-10-CM | POA: Diagnosis not present

## 2021-02-09 DIAGNOSIS — E785 Hyperlipidemia, unspecified: Secondary | ICD-10-CM | POA: Diagnosis not present

## 2021-02-09 DIAGNOSIS — I152 Hypertension secondary to endocrine disorders: Secondary | ICD-10-CM | POA: Diagnosis not present

## 2021-02-09 DIAGNOSIS — D649 Anemia, unspecified: Secondary | ICD-10-CM | POA: Diagnosis not present

## 2021-02-09 DIAGNOSIS — E1159 Type 2 diabetes mellitus with other circulatory complications: Secondary | ICD-10-CM | POA: Diagnosis not present

## 2021-02-11 ENCOUNTER — Other Ambulatory Visit: Payer: Self-pay | Admitting: Nurse Practitioner

## 2021-02-11 DIAGNOSIS — E785 Hyperlipidemia, unspecified: Secondary | ICD-10-CM

## 2021-02-11 NOTE — Telephone Encounter (Signed)
Requested Prescriptions  Pending Prescriptions Disp Refills  . atorvastatin (LIPITOR) 20 MG tablet [Pharmacy Med Name: ATORVASTATIN 20 MG TABLET] 90 tablet 3    Sig: Take 1 tablet (20 mg total) by mouth daily.     Cardiovascular:  Antilipid - Statins Failed - 02/11/2021 10:13 AM      Failed - HDL in normal range and within 360 days    HDL  Date Value Ref Range Status  01/05/2021 23 (L) >39 mg/dL Final         Failed - Triglycerides in normal range and within 360 days    Triglycerides  Date Value Ref Range Status  01/05/2021 303 (H) 0 - 149 mg/dL Final   Triglycerides Piccolo,Waived  Date Value Ref Range Status  05/25/2016 280 (H) <150 mg/dL Final    Comment:                            Normal                   <150                         Borderline High     150 - 199                         High                200 - 499                         Very High                >499          Passed - Total Cholesterol in normal range and within 360 days    Cholesterol, Total  Date Value Ref Range Status  01/05/2021 156 100 - 199 mg/dL Final   Cholesterol Piccolo, Waived  Date Value Ref Range Status  05/25/2016 141 <200 mg/dL Final    Comment:                            Desirable                <200                         Borderline High      200- 239                         High                     >239          Passed - LDL in normal range and within 360 days    LDL Chol Calc (NIH)  Date Value Ref Range Status  01/05/2021 83 0 - 99 mg/dL Final         Passed - Patient is not pregnant      Passed - Valid encounter within last 12 months    Recent Outpatient Visits          1 month ago Hypertension associated with diabetes ([place])   [facility] Jon Billings, NP   4 months ago Hypertension associated with diabetes ([place])   [facility] Jon Billings, NP   9 months ago Type 2 diabetes mellitus with hyperglycemia, without long-term current  use of insulin ([place])   [place], [place] T, NP   1 year ago Type 2 diabetes mellitus with hyperglycemia, without long-term current use of insulin [facility])   [place], Megan P, DO   1 year ago Type 2 diabetes mellitus with hyperglycemia, without long-term current use of insulin [facility])   [facility], Lilia Argue, [place]      Future Appointments            In 1 month Jon Billings, NP [facility], [place]

## 2021-02-16 ENCOUNTER — Other Ambulatory Visit: Payer: Self-pay | Admitting: Nurse Practitioner

## 2021-02-16 ENCOUNTER — Other Ambulatory Visit: Payer: Self-pay | Admitting: Family Medicine

## 2021-02-16 NOTE — Telephone Encounter (Signed)
Requested Prescriptions  Pending Prescriptions Disp Refills  . Azelastine HCl 0.15 % SOLN [Pharmacy Med Name: AZELASTINE 0.15% NASAL SPRAY] 90 mL 0    Sig: Place 2 sprays into both nostrils 2 (two) times daily. Place 2 sprays into both nostrils 2 (two) times daily.     Ear, Nose, and Throat: Nasal Preparations - Antiallergy Passed - 02/16/2021 11:15 AM      Passed - Valid encounter within last 12 months    Recent Outpatient Visits          1 month ago Hypertension associated with diabetes [facility])   [facility] Jon Billings, NP   4 months ago Hypertension associated with diabetes ([place])   [facility] Jon Billings, NP   9 months ago Type 2 diabetes mellitus with hyperglycemia, without long-term current use of insulin ([place])   [place], [place] T, NP   1 year ago Type 2 diabetes mellitus with hyperglycemia, without long-term current use of insulin ([place])   [place], Ronan P, DO   1 year ago Type 2 diabetes mellitus with hyperglycemia, without long-term current use of insulin [facility])   [facility], Lilia Argue, [place]      Future Appointments            In 1 month Jon Billings, NP [facility], [place]

## 2021-02-16 NOTE — Telephone Encounter (Signed)
Requested Prescriptions  Pending Prescriptions Disp Refills  . metFORMIN (GLUCOPHAGE-XR) 500 MG 24 hr tablet [Pharmacy Med Name: METFORMIN HCL ER 500 MG TABLET] 180 tablet 0    Sig: Take 1 tablet (500 mg total) by mouth in the morning and at bedtime.     Endocrinology:  Diabetes - Biguanides Failed - 02/16/2021 11:08 AM      Failed - Cr in normal range and within 360 days    Creatinine  Date Value Ref Range Status  08/16/2014 1.11 mg/dL Final    Comment:    0.61-1.24 NOTE: New Reference Range  06/25/14    Creatinine, Ser  Date Value Ref Range Status  01/05/2021 1.55 (H) 0.76 - 1.27 mg/dL Final         Failed - eGFR in normal range and within 360 days    EGFR (African American)  Date Value Ref Range Status  08/16/2014 >60  Final   GFR calc Af Amer  Date Value Ref Range Status  04/28/2020 56 (L) >59 mL/min/1.73 Final    Comment:    **In accordance with recommendations from the NKF-ASN Task force,**   [facility] is in the process of updating its eGFR calculation to the   2021 CKD-EPI creatinine equation that estimates kidney function   without a race variable.    EGFR (Non-African Amer.)  Date Value Ref Range Status  08/16/2014 >60  Final    Comment:    eGFR values <41mL/min/1.73 m2 may be an indication of chronic kidney disease (CKD). Calculated eGFR is useful in patients with stable renal function. The eGFR calculation will not be reliable in acutely ill patients when serum creatinine is changing rapidly. It is not useful in patients on dialysis. The eGFR calculation may not be applicable to patients at the low and high extremes of body sizes, pregnant women, and vegetarians.    GFR calc non Af Amer  Date Value Ref Range Status  04/28/2020 48 (L) >59 mL/min/1.73 Final   eGFR  Date Value Ref Range Status  01/05/2021 46 (L) >59 mL/min/1.73 Final         Passed - HBA1C is between 0 and 7.9 and within 180 days    Hemoglobin A1C  Date Value Ref Range Status   08/15/2014 6.7 (H) % Final    Comment:    4.0-6.0 NOTE: New Reference Range  06/25/14    HB A1C (BAYER DCA - WAIVED)  Date Value Ref Range Status  04/28/2020 7.2 (H) <7.0 % Final    Comment:                                          Diabetic Adult            <7.0                                       Healthy Adult        4.3 - 5.7                                                           (DCCT/NGSP) American Diabetes Association's  Summary of Glycemic Recommendations for Adults with Diabetes: Hemoglobin A1c <7.0%. More stringent glycemic goals (A1c <6.0%) may further reduce complications at the cost of increased risk of hypoglycemia.    Hgb A1c MFr Bld  Date Value Ref Range Status  01/05/2021 6.9 (H) 4.8 - 5.6 % Final    Comment:             Prediabetes: 5.7 - 6.4          Diabetes: >6.4          Glycemic control for adults with diabetes: <7.0          Passed - Valid encounter within last 6 months    Recent Outpatient Visits          1 month ago Hypertension associated with diabetes ([place])   [facility] Jon Billings, NP   4 months ago Hypertension associated with diabetes ([place])   [facility] Jon Billings, NP   9 months ago Type 2 diabetes mellitus with hyperglycemia, without long-term current use of insulin ([place])   [place], [place] T, NP   1 year ago Type 2 diabetes mellitus with hyperglycemia, without long-term current use of insulin ([place])   [place], [place] P, DO   1 year ago Type 2 diabetes mellitus with hyperglycemia, without long-term current use of insulin [facility])   [place], Lilia Argue, [place]      Future Appointments            In 1 month Jon Billings, NP [facility], [place]

## 2021-02-27 ENCOUNTER — Other Ambulatory Visit: Payer: Self-pay | Admitting: Nurse Practitioner

## 2021-02-27 DIAGNOSIS — I10 Essential (primary) hypertension: Secondary | ICD-10-CM

## 2021-02-27 NOTE — Telephone Encounter (Signed)
Requested Prescriptions  Pending Prescriptions Disp Refills  . quinapril (ACCUPRIL) 40 MG tablet [Pharmacy Med Name: QUINAPRIL 40 MG TABLET] 90 tablet 0    Sig: Take 1 tablet (40 mg total) by mouth daily.     Cardiovascular:  ACE Inhibitors Failed - 02/27/2021  9:53 AM      Failed - Cr in normal range and within 180 days    Creatinine  Date Value Ref Range Status  08/16/2014 1.11 mg/dL Final    Comment:    0.61-1.24 NOTE: New Reference Range  06/25/14    Creatinine, Ser  Date Value Ref Range Status  01/05/2021 1.55 (H) 0.76 - 1.27 mg/dL Final         Failed - Last BP in normal range    BP Readings from Last 1 Encounters:  01/05/21 (!) 142/78         Passed - K in normal range and within 180 days    Potassium  Date Value Ref Range Status  01/05/2021 4.9 3.5 - 5.2 mmol/L Final  08/16/2014 3.5 mmol/L Final    Comment:    3.5-5.1 NOTE: New Reference Range  06/25/14          Passed - Patient is not pregnant      Passed - Valid encounter within last 6 months    Recent Outpatient Visits          1 month ago Hypertension associated with diabetes ([place])   [place], Karen, NP   4 months ago Hypertension associated with diabetes ([place])   [facility] Jon Billings, NP   10 months ago Type 2 diabetes mellitus with hyperglycemia, without long-term current use of insulin ([place])   [place], [place] T, NP   1 year ago Type 2 diabetes mellitus with hyperglycemia, without long-term current use of insulin ([place])   [place], Megan P, DO   1 year ago Type 2 diabetes mellitus with hyperglycemia, without long-term current use of insulin ([place])   [place], Lilia Argue, [place]      Future Appointments            In 1 month Jon Billings, NP [place], [facility]           . carvedilol (COREG) 25 MG tablet [Pharmacy Med Name: CARVEDILOL 25 MG TABLET] 180  tablet 0    Sig: Take 1 tablet (25 mg total) by mouth 2 (two) times daily with a meal.     Cardiovascular:  Beta Blockers Failed - 02/27/2021  9:53 AM      Failed - Last BP in normal range    BP Readings from Last 1 Encounters:  01/05/21 (!) 142/78         Passed - Last Heart Rate in normal range    Pulse Readings from Last 1 Encounters:  01/05/21 (!) 55         Passed - Valid encounter within last 6 months    Recent Outpatient Visits          1 month ago Hypertension associated with diabetes ([place])   [place], [place], NP   4 months ago Hypertension associated with diabetes ([place])   [facility] Jon Billings, NP   10 months ago Type 2 diabetes mellitus with hyperglycemia, without long-term current use of insulin ([place])   [place], [place] T, NP   1 year ago Type 2 diabetes mellitus with  hyperglycemia, without long-term current use of insulin [facility])   [place], Megan P, DO   1 year ago Type 2 diabetes mellitus with hyperglycemia, without long-term current use of insulin [facility])   [place], Lilia Argue, [place]      Future Appointments            In 1 month Jon Billings, NP [facility], [place]

## 2021-03-02 ENCOUNTER — Other Ambulatory Visit: Payer: Self-pay | Admitting: Nurse Practitioner

## 2021-03-02 ENCOUNTER — Telehealth: Payer: Self-pay | Admitting: Nurse Practitioner

## 2021-03-02 NOTE — Telephone Encounter (Signed)
Requested Prescriptions  Pending Prescriptions Disp Refills  . omeprazole (PRILOSEC) 40 MG capsule [Pharmacy Med Name: OMEPRAZOLE DR 40 MG CAPSULE] 90 capsule 0    Sig: Take 1 capsule (40 mg total) by mouth daily.     Gastroenterology: Proton Pump Inhibitors Passed - 03/02/2021  9:54 AM      Passed - Valid encounter within last 12 months    Recent Outpatient Visits          1 month ago Hypertension associated with diabetes [facility])   [facility] Jon Billings, NP   5 months ago Hypertension associated with diabetes [facility])   [facility] Jon Billings, NP   10 months ago Type 2 diabetes mellitus with hyperglycemia, without long-term current use of insulin ([place])   [place], [place] T, NP   1 year ago Type 2 diabetes mellitus with hyperglycemia, without long-term current use of insulin ([place])   [place], [place] P, DO   1 year ago Type 2 diabetes mellitus with hyperglycemia, without long-term current use of insulin [facility])   [facility], Lilia Argue, [place]      Future Appointments            In 1 month Jon Billings, NP [facility], [place]

## 2021-03-02 NOTE — Telephone Encounter (Signed)
Copied from [place] [id number]. Topic: Medicare AWV >> Mar 02, 2021  1:53 PM Duane Hill wrote: Reason for CRM:  Left message for patient to call back and schedule the Medicare Annual Wellness Visit (AWV) virtually or by telephone.  Last AWV 12/06/18  Please schedule at anytime with [facility]-Nurse Health Advisor.  45 minute appointment  Any questions, please call me at [phone]

## 2021-03-03 ENCOUNTER — Ambulatory Visit (INDEPENDENT_AMBULATORY_CARE_PROVIDER_SITE_OTHER): Payer: Medicare Other

## 2021-03-03 ENCOUNTER — Telehealth: Payer: Medicare Other | Admitting: General Practice

## 2021-03-03 DIAGNOSIS — E1169 Type 2 diabetes mellitus with other specified complication: Secondary | ICD-10-CM

## 2021-03-03 DIAGNOSIS — E785 Hyperlipidemia, unspecified: Secondary | ICD-10-CM

## 2021-03-03 DIAGNOSIS — W19XXXD Unspecified fall, subsequent encounter: Secondary | ICD-10-CM

## 2021-03-03 DIAGNOSIS — M545 Low back pain, unspecified: Secondary | ICD-10-CM

## 2021-03-03 DIAGNOSIS — Z9181 History of falling: Secondary | ICD-10-CM

## 2021-03-03 DIAGNOSIS — E1159 Type 2 diabetes mellitus with other circulatory complications: Secondary | ICD-10-CM

## 2021-03-03 DIAGNOSIS — G8929 Other chronic pain: Secondary | ICD-10-CM

## 2021-03-03 DIAGNOSIS — E1165 Type 2 diabetes mellitus with hyperglycemia: Secondary | ICD-10-CM

## 2021-03-03 NOTE — Patient Instructions (Signed)
Visit Information  Current Barriers:  Knowledge Deficits related to plan of care for management of HTN, HLD, DMII, Falls and Safety Concerns, and Chronic pain  Care Coordination needs related to Limited social support, Transportation, Level of care concerns, ADL IADL limitations, Social Isolation, and Inability to perform IADL's independently  Chronic Disease Management support and education needs related to HTN, HLD, DMII, Falls and safety concerns, and Chronic pain  Lacks caregiver support.        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   RNCM Clinical Goal(s):  Patient will verbalize understanding of plan for management of HTN, HLD, DMII, and chronic pain and falls as evidenced by compliant with plan of care, working with the CCM team to optimize health and well being take all medications exactly as prescribed and will call provider for medication related questions as evidenced by taking medications as directed and calling for refills before running out of medications     attend all scheduled medical appointments: 04-06-2021 at 1:20 pm as evidenced by keeping appointments         demonstrate improved and ongoing adherence to prescribed treatment plan for HTN, HLD, DMII, and Chronic pain, falls, and safety as evidenced by reporting new falls, following the plan of care and working with the CCM team to effectively manage health and well being  demonstrate improved and ongoing health management independence as evidenced by working with the CCM team to optimize health and well being        demonstrate a decrease in HTN, HLD, DMII, and Chronic pains and falls and safety exacerbations  as evidenced by taking medications as prescribed, adherence with medications and dietary restrictions  demonstrate ongoing self health care management ability for effective management of chronic conditions  as evidenced by working with CCM team through collaboration with Consulting civil engineer, provider, and care team.    Interventions: 1:1 collaboration with primary care provider regarding development and update of comprehensive plan of care as evidenced by provider attestation and co-signature Inter-disciplinary care team collaboration (see longitudinal plan of care) Evaluation of current treatment plan related to  self management and patient's adherence to plan as established by provider   Diabetes:  (Status: Goal on Track (progressing): YES.) Long Term Goal   Lab Results  Component Value Date   HGBA1C 6.9 (H) 01/05/2021  Assessed patient's understanding of A1c goal: <7% Provided education to patient about basic DM disease process; Reviewed medications with patient and discussed importance of medication adherence. 03-03-2021: The patient has delivery of medications to his home;        Reviewed prescribed diet with patient heart healthy/ADA; Counseled on importance of regular laboratory monitoring as prescribed;        Discussed plans with patient for ongoing care management follow up and provided patient with direct contact information for care management team;      Provided patient with written educational materials related to hypo and hyperglycemia and importance of correct treatment;       Reviewed scheduled/upcoming provider appointments including: 04-06-2021 at 1:20 pm;         Advised patient, providing education and rationale, to check cbg as directed  and record. 03-03-2021: The patient states his blood sugar this am was 189 and later 111.        call provider for findings outside established parameters;       Review of patient status, including review of consultants reports, relevant laboratory and other test results, and medications completed;  Screening for signs and symptoms of depression related to chronic disease state;        Assessed social determinant of health barriers;         Falls:  (Status: Goal on Track (progressing): YES.) Long Term Goal  Provided written and verbal  education re: potential causes of falls and Fall prevention strategies Reviewed medications and discussed potential side effects of medications such as dizziness and frequent urination Advised patient of importance of notifying provider of falls. 03-03-2021: Education on the importance of notifying the provider of falls. The patient is very high fall risk. The patient does not always use his cane. Encouraged the patient to use DME and be mindful of safety at all times. The patient has worked with PT and that was helpful. He says he gets off balance a lot. Reviewed with the patient safety concerns with living by himself. The patient had mentioned assisted living but states he cannot leave his animals. Empathetic listening and support given.  Assessed for signs and symptoms of orthostatic hypotension Assessed for falls since last encounter. 03-03-2021: The patient states his last fall was last week when he was getting his groceries in. Has had several falls since the last outreach with RNCM. Assessed patients knowledge of fall risk prevention secondary to previously provided education Provided patient information for fall alert systems Assessed working status of life alert bracelet and patient adherence Advised patient to discuss new falls and safety concerns with provider Screening for signs and symptoms of depression related to chronic disease state Assessed social determinant of health barriers  Hyperlipidemia:  (Status: Goal on Track (progressing): YES.) Long Term Goal  Lab Results  Component Value Date   CHOL 156 01/05/2021   HDL 23 (L) 01/05/2021   LDLCALC 83 01/05/2021   TRIG 303 (H) 01/05/2021   CHOLHDL 6.8 (H) 01/05/2021     Medication review performed; medication list updated in electronic medical record.  Provider established cholesterol goals reviewed; Counseled on importance of regular laboratory monitoring as prescribed; Provided HLD educational materials; Reviewed role and  benefits of statin for ASCVD risk reduction; Discussed strategies to manage statin-induced myalgias; Reviewed importance of limiting foods high in cholesterol; Reviewed exercise goals and target of 150 minutes per week;  Hypertension: (Status: Goal on Track (progressing): YES.) Last practice recorded BP readings:  BP Readings from Last 3 Encounters:  01/05/21 (!) 142/78  10/03/20 (!) 148/70  04/28/20 128/76  Most recent eGFR/CrCl:  Lab Results  Component Value Date   EGFR 46 (L) 01/05/2021    No components found for: CRCL  Evaluation of current treatment plan related to hypertension self management and patient's adherence to plan as established by provider;   Provided education to patient re: stroke prevention, s/s of heart attack and stroke; Reviewed prescribed diet heart healthy/ADA diet  Reviewed medications with patient and discussed importance of compliance;  Discussed plans with patient for ongoing care management follow up and provided patient with direct contact information for care management team; Advised patient, providing education and rationale, to monitor blood pressure daily and record, calling PCP for findings outside established parameters;  Provided education on prescribed diet heart healthy/ADA ;  Discussed complications of poorly controlled blood pressure such as heart disease, stroke, circulatory complications, vision complications, kidney impairment, sexual dysfunction;   Pain:  (Status: Goal on Track (progressing): YES.) Long Term Goal  Pain assessment performed. 03-03-2021: The patient states that he has pain daily and when he is not sitting it is at a  10. The patient states that he has it on his right side. He has scoliosis and he had a doctor tell him a long time ago when he got older he would have issues with back pain. Review of daily routine and effective management of back pain Medications reviewed. 03-03-2021: The patient states he only takes ibuprofen for  back pain and changes position. States that this is something he has learned to live with.  Reviewed provider established plan for pain management. 03-03-2021: The patient takes ibuprofen for pain management of chronic back pain and also changes position when needed  ; Discussed importance of adherence to all scheduled medical appointments; Counseled on the importance of reporting any/all new or changed pain symptoms or management strategies to pain management provider; Advised patient to report to care team affect of pain on daily activities; Discussed use of relaxation techniques and/or diversional activities to assist with pain reduction (distraction, imagery, relaxation, massage, acupressure, TENS, heat, and cold application; Reviewed with patient prescribed pharmacological and nonpharmacological pain relief strategies; Advised patient to discuss unresolved pain, changes in level or intensity of pain with provider;  Patient Goals/Self-Care Activities: Patient will self administer medications as prescribed as evidenced by self report/primary caregiver report  Patient will attend all scheduled provider appointments as evidenced by clinician review of documented attendance to scheduled appointments and patient/caregiver report Patient will call pharmacy for medication refills as evidenced by patient report and review of pharmacy fill history as appropriate Patient will attend church or other social activities as evidenced by patient report Patient will continue to perform ADL's independently as evidenced by patient/caregiver report Patient will continue to perform IADL's independently as evidenced by patient/caregiver report Patient will call provider office for new concerns or questions as evidenced by review of documented incoming telephone call notes and patient report Patient will work with BSW to address care coordination needs and will continue to work with the clinical team to address health  care and disease management related needs as evidenced by documented adherence to scheduled care management/care coordination appointments schedule appointment with eye doctor check blood sugar at prescribed times: when you have symptoms of low or high blood sugar, before and after exercise, and as directed by the pcp  check feet daily for cuts, sores or redness enter blood sugar readings and medication or insulin into daily log take the blood sugar log to all doctor visits trim toenails straight across drink 6 to 8 glasses of water each day eat fish at least once per week fill half of plate with vegetables limit fast food meals to no more than 1 per week manage portion size prepare main meal at home 3 to 5 days each week read food labels for fat, fiber, carbohydrates and portion size reduce red meat to 2 to 3 times a week set a realistic goal keep feet up while sitting wash and dry feet carefully every day wear comfortable, cotton socks wear comfortable, well-fitting shoes - check blood pressure weekly - choose a place to take my blood pressure (home, clinic or office, retail store) - write blood pressure results in a log or diary - learn about high blood pressure - keep a blood pressure log - take blood pressure log to all doctor appointments - call doctor for signs and symptoms of high blood pressure - develop an action plan for high blood pressure - keep all doctor appointments - take medications for blood pressure exactly as prescribed - report new symptoms to your doctor -  eat more whole grains, fruits and vegetables, lean meats and healthy fats - call for medicine refill 2 or 3 days before it runs out - take all medications exactly as prescribed - call doctor with any symptoms you believe are related to your medicine - call doctor when you experience any new symptoms - go to all doctor appointments as scheduled - adhere to prescribed diet: heart healthy/ADA         Patient verbalizes understanding of instructions provided today and agrees to view in [place].   Telephone follow up appointment with care management team member scheduled for: 05-05-2021 at 230 pm  Noreene Larsson RN, MSN, [facility] Mobile: [phone]

## 2021-03-03 NOTE — Chronic Care Management (AMB) (Signed)
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 effective management of HLD  patient will take all medications exactly as prescribed and will call provider for medication related questions patient will attend all scheduled medical appointments: 01-05-2021 at 240 pm patient will demonstrate improved adherence to prescribed treatment plan for HLD patient will demonstrate improved health management independence patient will verbalize basic understanding of HLD disease process and self health management plan patient will demonstrate understanding of rationale for each prescribed medication the patient will demonstrate ongoing self health care management ability Interventions: Collaboration with Jon Billings, NP regarding development and update of comprehensive plan of care as evidenced by provider attestation and co-signature Inter-disciplinary care team collaboration (see longitudinal plan of care) Medication review performed; medication list updated in electronic medical record 12-24-2020: The patient states he is not taking the fenofibrate for his cholesterol. In basket message sent to  clinical staff and pcp Inter-disciplinary care team collaboration (see longitudinal plan of care) Referred to pharmacy team for assistance with HLD medication management Evaluation of current treatment plan related to HLD  and patient's adherence to plan as established by provider. Advised patient to talk to the pcp about plan of care for managing HLD. The patient states he gets easily confused with my chart and did not follow up on recommendations from the pcp. Reviewed notes with the patient today. He is unsure if he can come on the 19th to see pcp because of transportation. Message sent to clinical staff and pcp with patients expressed concerns.  Provided education to patient re: not using processed meats or refined sugars in diet, monitoring for a heart healthy diet.  Reviewed medications with patient and discussed recommendations, will do a pharmacy  consult for assistance with education and reconciliation Collaborated with pcp, clinical team, and pharm D regarding medications and recommendations for effective management of HLD Reviewed scheduled/upcoming provider appointments including: 01-05-2021 at 240 pm Discussed plans with patient for ongoing care management follow up and provided patient with direct contact information for care management team Patient Goals/Self-Care Activities: - call for medicine refill 2 or 3 days before it runs out - call if I am sick and can't take my medicine - keep a list of all the medicines I take; vitamins and herbals too - learn to read medicine labels - use a pillbox to sort medicine - use an alarm clock or phone to remind me to take my medicine - change to whole grain breads, cereal, pasta - drink 6 to 8 glasses of water each day - eat 3 to 5 servings of fruits and vegetables each day - eat 5 or 6 small meals each day - eat fish at least once per week - fill half the plate with nonstarchy vegetables - limit fast food meals to no more than 1 per week -  manage portion size - prepare main meal at home 3 to 5 days each week - read food labels for fat, fiber, carbohydrates and portion size - be open to making changes - I can manage, know and watch for signs of a heart attack - if I have chest pain, call for help - learn about small changes that will make a big difference - learn my personal risk factors  Follow Up Plan: Telephone follow up appointment with care management team member scheduled for: 03-03-2021 at 230 pm      Care Plan : RNCM: General Plan of Care (Adult) for Chronic Disease Management and Care Coordination Needs  Updates made by Vanita Ingles, RN since 03/03/2021 12:00 AM     Problem: RNCM: Development of Plan of Care for Chronic Disease Management (DM, HTM, HLD, Chronic pain, Falls and safety)   Priority: High     Long-Range Goal: RNCM: Effective Management  of Plan of Care for Chronic Disease Management (DM, HTM, HLD, Chronic pain, Falls and safety)   Start Date: 03/03/2021  Expected End Date: 03/03/2022  Priority: High  Note:   Current Barriers:  Knowledge Deficits related to plan of care for management of HTN, HLD, DMII, Falls and Safety Concerns, and Chronic pain  Care Coordination needs related to Limited social support, Transportation, Level of care concerns, ADL IADL limitations, Social Isolation, and Inability to perform IADL's independently  Chronic Disease Management support and education needs related to HTN, HLD, DMII, Falls and safety concerns, and Chronic pain  Lacks caregiver support.        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   RNCM Clinical Goal(s):  Patient will verbalize understanding of plan for management of HTN, HLD, DMII, and chronic pain and falls as evidenced by compliant with plan of care, working with the CCM team to optimize health and well being take all medications exactly as prescribed and will call provider for medication related questions as evidenced by taking medications  as directed and calling for refills before running out of medications     attend all scheduled medical appointments: 04-06-2021 at 1:20 pm as evidenced by keeping appointments         demonstrate improved and ongoing adherence to prescribed treatment plan for HTN, HLD, DMII, and Chronic pain, falls, and safety as evidenced by reporting new falls, following the plan of care  and working with the CCM team to effectively manage health and well being  demonstrate improved and ongoing health management independence as evidenced by working with the CCM team to optimize health and well being        demonstrate a decrease in HTN, HLD, DMII, and Chronic pains and falls and safety exacerbations  as evidenced by taking medications as prescribed, adherence with medications and dietary restrictions  demonstrate ongoing self health care management ability for effective management of chronic conditions  as evidenced by working with CCM team through collaboration with Consulting civil engineer, provider, and care team.   Interventions: 1:1 collaboration with primary care provider regarding development and update of comprehensive plan of care as evidenced by provider attestation and co-signature Inter-disciplinary care team collaboration (see longitudinal plan of care) Evaluation of current treatment plan related to  self management and patient's adherence to plan as established by provider   Diabetes:  (Status: Goal on Track (progressing): YES.) Long Term Goal   Lab Results  Component Value Date   HGBA1C 6.9 (H) 01/05/2021  Assessed patient's understanding of A1c goal: <7% Provided education to patient about basic DM disease process; Reviewed medications with patient and discussed importance of medication adherence. 03-03-2021: The patient has delivery of medications to his home;        Reviewed prescribed diet with patient heart healthy/ADA; Counseled on importance of regular laboratory monitoring as prescribed;         Discussed plans with patient for ongoing care management follow up and provided patient with direct contact information for care management team;      Provided patient with written educational materials related to hypo and hyperglycemia and importance of correct treatment;       Reviewed scheduled/upcoming provider appointments including: 04-06-2021 at 1:20 pm;         Advised patient, providing education and rationale, to check cbg as directed  and record. 03-03-2021: The patient states his blood sugar this am was 189 and later 111.        call provider for findings outside established parameters;       Review of patient status, including review of consultants reports, relevant laboratory and other test results, and medications completed;       Screening for signs and symptoms of depression related to chronic disease state;        Assessed social determinant of health barriers;         Falls:  (Status: Goal on Track (progressing): YES.) Long Term Goal  Provided written and verbal education re: potential causes of falls and Fall prevention strategies Reviewed medications and discussed potential side effects of medications such as dizziness and frequent urination Advised patient of importance of notifying provider of falls. 03-03-2021: Education on the importance of notifying the provider of falls. The patient is very high fall risk. The patient does not always use his cane. Encouraged the patient to use DME and be mindful of safety at all times. The patient has worked with PT and that was helpful. He says he gets off balance a lot. Reviewed with the patient safety concerns with living by himself. The patient had mentioned assisted living but states he cannot leave his animals. Empathetic listening and support given.  Assessed for signs and symptoms of orthostatic hypotension Assessed for falls since last encounter. 03-03-2021: The patient states his last fall was last week when he was getting his  groceries in. Has had several falls since the last outreach with RNCM.  Assessed patients knowledge of fall risk prevention secondary to previously provided education Provided patient information for fall alert systems Assessed working status of life alert bracelet and patient adherence Advised patient to discuss new falls and safety concerns with provider Screening for signs and symptoms of depression related to chronic disease state Assessed social determinant of health barriers  Hyperlipidemia:  (Status: Goal on Track (progressing): YES.) Long Term Goal  Lab Results  Component Value Date   CHOL 156 01/05/2021   HDL 23 (L) 01/05/2021   LDLCALC 83 01/05/2021   TRIG 303 (H) 01/05/2021   CHOLHDL 6.8 (H) 01/05/2021     Medication review performed; medication list updated in electronic medical record.  Provider established cholesterol goals reviewed; Counseled on importance of regular laboratory monitoring as prescribed; Provided HLD educational materials; Reviewed role and benefits of statin for ASCVD risk reduction; Discussed strategies to manage statin-induced myalgias; Reviewed importance of limiting foods high in cholesterol; Reviewed exercise goals and target of 150 minutes per week;  Hypertension: (Status: Goal on Track (progressing): YES.) Last practice recorded BP readings:  BP Readings from Last 3 Encounters:  01/05/21 (!) 142/78  10/03/20 (!) 148/70  04/28/20 128/76  Most recent eGFR/CrCl:  Lab Results  Component Value Date   EGFR 46 (L) 01/05/2021    No components found for: CRCL  Evaluation of current treatment plan related to hypertension self management and patient's adherence to plan as established by provider;   Provided education to patient re: stroke prevention, s/s of heart attack and stroke; Reviewed prescribed diet heart healthy/ADA diet  Reviewed medications with patient and discussed importance of compliance;  Discussed plans with patient for ongoing  care management follow up and provided patient with direct contact information for care management team; Advised patient, providing education and rationale, to monitor blood pressure daily and record, calling PCP for findings outside established parameters;  Provided education on prescribed diet heart healthy/ADA ;  Discussed complications of poorly controlled blood pressure such as heart disease, stroke, circulatory complications, vision complications, kidney impairment, sexual dysfunction;   Pain:  (Status: Goal on Track (progressing): YES.) Long Term Goal  Pain assessment performed. 03-03-2021: The patient states that he has pain daily and when he is not sitting it is at a 10. The patient states that he has it on his right side. He has scoliosis and he had a doctor tell him a long time ago when he got older he would have issues with back pain. Review of daily routine and effective management of back pain Medications reviewed. 03-03-2021: The patient states he only takes ibuprofen for back pain and changes position. States that this is something he has learned to live with.  Reviewed provider established plan for pain management. 03-03-2021: The patient takes ibuprofen for pain management of chronic back pain and also changes position when needed  ; Discussed importance of adherence to all scheduled medical appointments; Counseled on the importance of reporting any/all new or changed pain symptoms or management strategies to pain management provider; Advised patient to report to care team affect of pain on daily activities; Discussed use of relaxation techniques and/or diversional activities to assist with pain reduction (distraction, imagery, relaxation, massage, acupressure, TENS, heat, and cold application; Reviewed with patient prescribed pharmacological and nonpharmacological pain relief strategies; Advised patient to discuss unresolved pain, changes in level or intensity of pain with  provider;  Patient Goals/Self-Care Activities: Patient will self administer medications as prescribed as evidenced by self report/primary caregiver report  Patient  will attend all scheduled provider appointments as evidenced by clinician review of documented attendance to scheduled appointments and patient/caregiver report Patient will call pharmacy for medication refills as evidenced by patient report and review of pharmacy fill history as appropriate Patient will attend church or other social activities as evidenced by patient report Patient will continue to perform ADL's independently as evidenced by patient/caregiver report Patient will continue to perform IADL's independently as evidenced by patient/caregiver report Patient will call provider office for new concerns or questions as evidenced by review of documented incoming telephone call notes and patient report Patient will work with BSW to address care coordination needs and will continue to work with the clinical team to address health care and disease management related needs as evidenced by documented adherence to scheduled care management/care coordination appointments schedule appointment with eye doctor check blood sugar at prescribed times: when you have symptoms of low or high blood sugar, before and after exercise, and as directed by the pcp  check feet daily for cuts, sores or redness enter blood sugar readings and medication or insulin into daily log take the blood sugar log to all doctor visits trim toenails straight across drink 6 to 8 glasses of water each day eat fish at least once per week fill half of plate with vegetables limit fast food meals to no more than 1 per week manage portion size prepare main meal at home 3 to 5 days each week read food labels for fat, fiber, carbohydrates and portion size reduce red meat to 2 to 3 times a week set a realistic goal keep feet up while sitting wash and dry feet carefully  every day wear comfortable, cotton socks wear comfortable, well-fitting shoes - check blood pressure weekly - choose a place to take my blood pressure (home, clinic or office, retail store) - write blood pressure results in a log or diary - learn about high blood pressure - keep a blood pressure log - take blood pressure log to all doctor appointments - call doctor for signs and symptoms of high blood pressure - develop an action plan for high blood pressure - keep all doctor appointments - take medications for blood pressure exactly as prescribed - report new symptoms to your doctor - eat more whole grains, fruits and vegetables, lean meats and healthy fats - call for medicine refill 2 or 3 days before it runs out - take all medications exactly as prescribed - call doctor with any symptoms you believe are related to your medicine - call doctor when you experience any new symptoms - go to all doctor appointments as scheduled - adhere to prescribed diet: heart healthy/ADA        Plan:Telephone follow up appointment with care management team member scheduled for:  05-05-2021 at 230 pm  [place], MSN, [facility] Mobile: [phone]

## 2021-03-14 ENCOUNTER — Other Ambulatory Visit: Payer: Self-pay | Admitting: Nurse Practitioner

## 2021-03-15 NOTE — Telephone Encounter (Signed)
Requested Prescriptions  Pending Prescriptions Disp Refills  . gabapentin (NEURONTIN) 300 MG capsule [Pharmacy Med Name: GABAPENTIN 300 MG CAPSULE] 360 capsule 0    Sig: Take 2 capsules (600 mg total) by mouth 2 (two) times daily.     Neurology: Anticonvulsants - gabapentin Passed - 03/14/2021  9:18 AM      Passed - Valid encounter within last 12 months    Recent Outpatient Visits          2 months ago Hypertension associated with diabetes [facility])   [facility] Jon Billings, NP   5 months ago Hypertension associated with diabetes [facility])   [facility] Jon Billings, NP   10 months ago Type 2 diabetes mellitus with hyperglycemia, without long-term current use of insulin ([place])   [place], Astor T, NP   1 year ago Type 2 diabetes mellitus with hyperglycemia, without long-term current use of insulin ([place])   [place], Megan P, DO   1 year ago Type 2 diabetes mellitus with hyperglycemia, without long-term current use of insulin [facility])   [facility] [name], [place]      Future Appointments            In 3 weeks Jon Billings, NP [facility], [place]           . omeprazole (PRILOSEC) 40 MG capsule [Pharmacy Med Name: OMEPRAZOLE DR 40 MG CAPSULE] 90 capsule 0    Sig: Take 1 capsule (40 mg total) by mouth daily.     Gastroenterology: Proton Pump Inhibitors Passed - 03/14/2021  9:18 AM      Passed - Valid encounter within last 12 months    Recent Outpatient Visits          2 months ago Hypertension associated with diabetes [facility])   [facility] Jon Billings, NP   5 months ago Hypertension associated with diabetes [facility])   [facility] Jon Billings, NP   10 months ago Type 2 diabetes mellitus with hyperglycemia, without long-term current use of insulin ([place])   [place], Adrian T, NP   1 year ago Type 2 diabetes  mellitus with hyperglycemia, without long-term current use of insulin ([place])   [place], Megan P, DO   1 year ago Type 2 diabetes mellitus with hyperglycemia, without long-term current use of insulin [facility])   [place], Lilia Argue, [place]      Future Appointments            In 3 weeks Jon Billings, NP [facility] [place]

## 2021-03-18 DIAGNOSIS — Z7984 Long term (current) use of oral hypoglycemic drugs: Secondary | ICD-10-CM | POA: Diagnosis not present

## 2021-03-18 DIAGNOSIS — I10 Essential (primary) hypertension: Secondary | ICD-10-CM | POA: Diagnosis not present

## 2021-03-18 DIAGNOSIS — E1159 Type 2 diabetes mellitus with other circulatory complications: Secondary | ICD-10-CM | POA: Diagnosis not present

## 2021-03-18 DIAGNOSIS — E785 Hyperlipidemia, unspecified: Secondary | ICD-10-CM | POA: Diagnosis not present

## 2021-04-02 ENCOUNTER — Ambulatory Visit (INDEPENDENT_AMBULATORY_CARE_PROVIDER_SITE_OTHER): Payer: Medicare Other | Admitting: Licensed Clinical Social Worker

## 2021-04-02 DIAGNOSIS — E1165 Type 2 diabetes mellitus with hyperglycemia: Secondary | ICD-10-CM

## 2021-04-02 DIAGNOSIS — E1169 Type 2 diabetes mellitus with other specified complication: Secondary | ICD-10-CM

## 2021-04-02 DIAGNOSIS — E785 Hyperlipidemia, unspecified: Secondary | ICD-10-CM

## 2021-04-02 DIAGNOSIS — E1159 Type 2 diabetes mellitus with other circulatory complications: Secondary | ICD-10-CM

## 2021-04-03 NOTE — Patient Instructions (Signed)
Visit Information  Thank you for taking time to visit with me today. Please don't hesitate to contact me if I can be of assistance to you before our next scheduled telephone appointment.  Following are the goals we discussed today:  Patient Goals/Self-Care Activities: Over the next 120 days Utilize healthy coping skills and self-care strategies discussed Continue compliance with medications Attend all scheduled appointments with providers Contact clinic with any questions or concerns  Our next appointment is by telephone on 05/14/21   Please call the care guide team at [phone] if you need to cancel or reschedule your appointment.   If you are experiencing a Mental Health or [place] or need someone to talk to, please call the Suicide and Crisis Lifeline: 988 call 911   Patient verbalizes understanding of instructions provided today and agrees to view in [place].   Christa See, MSW, [email or web address] Phone [phone] 11:05 AM

## 2021-04-03 NOTE — Chronic Care Management (AMB) (Signed)
Chronic Care Management    Clinical Social Work Note  04/03/2021 Name: Duane Hill. MRN: [id number] DOB: 1940/11/18  Duane Hill. is a 80 y.o. year old male who is a primary care patient of Jon Billings, NP. The CCM team was consulted to assist the patient with chronic disease management and/or care coordination needs related to: Mental Health Counseling and Resources.   Engaged with patient by telephone for follow up visit in response to provider referral for social work chronic care management and care coordination services.   Consent to Services:  The patient was given information about Chronic Care Management services, agreed to services, and gave verbal consent prior to initiation of services.  Please see initial visit note for detailed documentation.   Patient agreed to services and consent obtained.   Consent to Services:  The patient was given information about Care Management services, agreed to services, and gave verbal consent prior to initiation of services.  Please see initial visit note for detailed documentation.   Patient agreed to services today and consent obtained.  Engaged with patient by phone in response to provider referral for social work care coordination services:  Assessment/Interventions:  Patient continues to maintain positive progress with care plan goals. Reports decrease in stress despite management of back pain. CCM LCSW completed care guide referral to assist patient with transportation options to grocery store.   See Care Plan below for interventions and patient self-care activities.  Recent life changes or stressors: Transportation  Recommendation: Patient may benefit from, and is in agreement work with LCSW to address care coordination needs and will continue to work with the clinical team to address health care and disease management related needs.   Follow up Plan: Patient would like continued follow-up from CCM LCSW.  per  patient's request will follow up in 05/14/21.  Will call office if needed prior to next encounter.   SDOH (Social Determinants of Health) assessments and interventions performed:    Advanced Directives Status: Not addressed in this encounter.  CCM Care Plan  Allergies  Allergen Reactions   Protonix [Pantoprazole Sodium] Nausea And Vomiting    Outpatient Encounter Medications as of 04/02/2021  Medication Sig   acetaminophen (TYLENOL) 500 MG tablet Take 1,000 mg by mouth every 6 (six) hours as needed. Patient uss Caplet extra strength   albuterol (VENTOLIN HFA) 108 (90 Base) MCG/ACT inhaler Take 2 puffs every 4-6 hours as necessary for shortness of breath   atorvastatin (LIPITOR) 20 MG tablet Take 1 tablet (20 mg total) by mouth daily.   Azelastine HCl 0.15 % SOLN Place 2 sprays into both nostrils 2 (two) times daily. Place 2 sprays into both nostrils 2 (two) times daily.   carvedilol (COREG) 25 MG tablet Take 1 tablet (25 mg total) by mouth 2 (two) times daily with a meal.   cetirizine (ZYRTEC) 10 MG tablet Take 1 tablet (10 mg total) by mouth daily.   Choline Fenofibrate (FENOFIBRIC ACID) 135 MG CPDR Take 1 capsule by mouth daily.   fluticasone (FLONASE) 50 MCG/ACT nasal spray Place 1 spray into both nostrils 2 (two) times daily.   gabapentin (NEURONTIN) 300 MG capsule Take 2 capsules (600 mg total) by mouth 2 (two) times daily.   glipiZIDE (GLUCOTROL) 5 MG tablet Take 5 mg by mouth 2 (two) times daily before a meal. (Patient not taking: Reported on 01/12/2021)   glucose blood test strip CHECK BLOOD SUGAR TWICE DAILY.   hydrochlorothiazide (HYDRODIURIL) 25 MG tablet Take  1 tablet (25 mg total) by mouth daily.   metFORMIN (GLUCOPHAGE-XR) 500 MG 24 hr tablet Take 1 tablet (500 mg total) by mouth in the morning and at bedtime.   Microlet Lancets MISC USE TWICE DAILY AS DIRECTED.   montelukast (SINGULAIR) 10 MG tablet Take 1 tablet (10 mg total) by mouth at bedtime.   nitrofurantoin,  macrocrystal-monohydrate, (MACROBID) 100 MG capsule Take 1 capsule (100 mg total) by mouth 2 (two) times daily.   omeprazole (PRILOSEC) 40 MG capsule Take 1 capsule (40 mg total) by mouth daily.   quinapril (ACCUPRIL) 40 MG tablet Take 1 tablet (40 mg total) by mouth daily.   No facility-administered encounter medications on file as of 04/02/2021.    Patient Active Problem List   Diagnosis Date Noted   B12 deficiency 04/29/2020   HSV (herpes simplex virus) infection 05/10/2019   Allergic rhinitis 02/14/2019   Advanced care planning/counseling discussion 11/29/2016   GERD (gastroesophageal reflux disease) 11/05/2014   Hyperlipidemia associated with type 2 diabetes mellitus ([place]) 11/05/2014   Chronic radiation cystitis 11/05/2014   Hypertension associated with diabetes ([place]) 02/02/2013   Type 2 diabetes mellitus with hyperglycemia ([place]) 02/02/2013   History of prostate cancer 02/02/2013    Conditions to be addressed/monitored: HTN and DMII; Limited social support and Transportation  Care Plan : General Social Work (Adult)  Updates made by Christa See D, LCSW since 04/03/2021 12:00 AM     Problem: Quality of Life (General Plan of Care)      Goal: Quality of Life Maintained   Start Date: 11/06/2020  This Visit's Progress: On track  Recent Progress: On track  Priority: Medium  Note:   Current barriers:   Acute Mental Health needs related to Stress Limited social support, Transportation, and Family and relationship dysfunction Needs Support, Education, and Care Coordination in order to meet unmet mental health needs. Clinical Goal(s): Over the next 120 days, patient will work with SW, counselor and therapist to reduce or manage symptoms of agitation, mood instability, stress, and bipolar until connected for ongoing counseling. Clinical Interventions:  Assessed patient's previous and current treatment, coping skills, support system and barriers to care  Patient interviewed and  appropriate assessments performed Patient reports ongoing leg weakness is about the same Patient is unsure if it's because of scoliosis (diagnosed at 80 yo) or if its because of his age. Patient reports back pain. He denies any strenuous activities or heavy lifting. Patient reports that he doesn't have great balance anymore. CCM LCSW discussed strategies that patient can utilize to prevent risk of falling and promote safety. 09/29: Patient reports recent fall at the beginning of the month. He continues to utilize a cane to assist with balance and prevent fall risk 12/15: Patient reports ongoing leg weakness and ongoing balance concerns. He has been utilizing salve, telynol, and ibuprofen to relieve pain in the back of pt's right hip. No recent falls Patient has modified his diet to assist with management of diabetes. He has equipment at home to check blood sugars when needed CCM LCSW discussed strategies to assist in management of diabetes. Patient is motivated because, I don't want to lose any limbs Patient reports that he doesnt check readings often, only check it when feels like something is wrong. Patient agreed to increase the amount of times he checks his blood sugar to prevent episodes of hypoglycemia 09/29: Reports excitement that his A1C decreased at previous appt with PCP Patient reports limited support. He does not speak with his  two daughters and his son has not contacted him after re-locating out of state. Patient's oldest sister resides next door and he speaks with neighbors occasionally 12/15: Patient denies current stressors, depression, anxiety symptoms Patient receives emotional support from his cats (5-6) and his lab dog, Cooper CCM LCSW discussed benefits of implementing self-care in his daily routine. Strategies were identified Patient utilizes a calendar and MyChart to keep up with appointments Patient will utilize [facility] to appointments 12/15: CCM LCSW will complete  care guide referral to inform pt of transportation options to assist with traveling to grocery store Solution-Focused Strategies, Mindfulness or Relaxation Training, Active listening / Reflection utilized , Emotional Supportive Provided, Psychoeducation for mental health needs , Brief CBT , and Verbalization of feelings encouraged  ; Discussed plans with patient for ongoing care management follow up and provided patient with direct contact information for care management team Collaboration with PCP regarding development and update of comprehensive plan of care as evidenced by provider attestation and co-signature Inter-disciplinary care team collaboration (see longitudinal plan of care) Patient Goals/Self-Care Activities: Over the next 120 days Utilize healthy coping skills and self-care strategies discussed Continue compliance with medications Attend all scheduled appointments with providers Contact clinic with any questions or concerns        Christa See, MSW, [email or web address] Phone [phone] 11:03 AM

## 2021-04-06 ENCOUNTER — Ambulatory Visit: Payer: Medicare Other | Admitting: Nurse Practitioner

## 2021-04-27 ENCOUNTER — Telehealth: Payer: Medicare Other

## 2021-04-27 ENCOUNTER — Telehealth: Payer: Self-pay

## 2021-04-27 ENCOUNTER — Telehealth: Payer: Self-pay | Admitting: Nurse Practitioner

## 2021-04-27 NOTE — Telephone Encounter (Signed)
Copied from [place] [id number]. Topic: Medicare AWV >> Apr 27, 2021  3:59 PM Lavonia Drafts wrote: Reason for CRM:  Left message for patient to call back and schedule the Medicare Annual Wellness Visit (AWV) virtually or by telephone.  Last AWV 12/06/18  Please schedule at anytime with CFP-Nurse Health Advisor.  45 minute appointment  Any questions, please call me at [phone]

## 2021-04-27 NOTE — Telephone Encounter (Signed)
°  Care Management   Follow Up Note   04/27/2021 Name: Montario Zilka. MRN: [id number] DOB: [birth year]   Referred by: Jon Billings, NP Reason for referral : Chronic Care Management [place]   An unsuccessful telephone outreach was attempted today. The patient was referred to the case management team for assistance with care management and care coordination.   Follow Up Plan:  [facility] notified of no show and to RS pt   Madelin Rear, PharmD, [facility] Clinical Pharmacist  [place]  [phone]

## 2021-04-30 ENCOUNTER — Other Ambulatory Visit: Payer: Self-pay | Admitting: Family Medicine

## 2021-04-30 NOTE — Telephone Encounter (Signed)
Requested Prescriptions  Pending Prescriptions Disp Refills   montelukast (SINGULAIR) 10 MG tablet [Pharmacy Med Name: MONTELUKAST SOD 10 MG TABLET] 90 tablet 0    Sig: Take 1 tablet (10 mg total) by mouth at bedtime.     Pulmonology:  Leukotriene Inhibitors Passed - 04/30/2021 11:28 AM      Passed - Valid encounter within last 12 months    Recent Outpatient Visits          3 months ago Hypertension associated with diabetes [facility])   [facility] Jon Billings, NP   6 months ago Hypertension associated with diabetes ([place])   [facility] Jon Billings, NP   1 year ago Type 2 diabetes mellitus with hyperglycemia, without long-term current use of insulin ([place])   [place], [place] T, NP   1 year ago Type 2 diabetes mellitus with hyperglycemia, without long-term current use of insulin ([place])   [place], Megan P, DO   1 year ago Type 2 diabetes mellitus with hyperglycemia, without long-term current use of insulin [facility])   [place], [place], [place]

## 2021-05-01 ENCOUNTER — Telehealth: Payer: Self-pay

## 2021-05-01 NOTE — Chronic Care Management (AMB) (Signed)
°  Care Management   Note  05/01/2021 Name: Duane Hill. MRN: [id number] DOB: Jun 22, 1940  Duane Hill. is a 81 y.o. year old male who is a primary care patient of Jon Billings, NP and is actively engaged with the care management team. I reached out to Duane Hill. by phone today to assist with re-scheduling a follow up visit with the Licensed Clinical Social Worker  Follow up plan: Telephone appointment with care management team member scheduled for:05/13/2021  Noreene Larsson, [place], [place], [place] [address] Direct Dial: [phone] [email or web address] [email or web address]

## 2021-05-01 NOTE — Chronic Care Management (AMB) (Signed)
°  Care Management   Note  05/01/2021 Name: Duane Hill. MRN: [id number] DOB: 02/15/1941  Duane Hill. is a 81 y.o. year old male who is a primary care patient of Jon Billings, NP and is actively engaged with the care management team. I reached out to Duane Hill. by phone today to assist with re-scheduling a follow up visit with the Licensed Clinical Social Worker  Follow up plan: Unsuccessful telephone outreach attempt made. The care management team will reach out to the patient again over the next 7 days.  If patient returns call to provider office, please advise to call [facility]  at [place], [place], [place], [place] [address] Direct Dial: [phone] [email or web address] Website: [email or web address]

## 2021-05-04 NOTE — Telephone Encounter (Signed)
° °  Telephone encounter was:  Successful.  05/04/2021 Name: Duane Hill. MRN: [id number] DOB: 13-Jul-1940  Duane Hill. is a 81 y.o. year old male who is a primary care patient of Jon Billings, NP . The community resource team was consulted for assistance with Transportation Needs   Care guide performed the following interventions:  Pt advised he's used lyft/uber with insurance company before. He just doesn't recall which company but he believes it was [employer]. Pt needs assistance with transportation to the grocery stores at this time. He says he can drive but just needs to get a car.  Follow Up Plan:  Care guide will outreach resources to assist patient with transportation.  [place] management  [place], [place]  Main Phone: [phone]   E-mail: [email or web address]  Website: [email or web address]

## 2021-05-05 ENCOUNTER — Telehealth: Payer: Medicare Other

## 2021-05-05 ENCOUNTER — Ambulatory Visit (INDEPENDENT_AMBULATORY_CARE_PROVIDER_SITE_OTHER): Payer: Medicare Other

## 2021-05-05 DIAGNOSIS — E785 Hyperlipidemia, unspecified: Secondary | ICD-10-CM

## 2021-05-05 DIAGNOSIS — M545 Low back pain, unspecified: Secondary | ICD-10-CM

## 2021-05-05 DIAGNOSIS — Z9181 History of falling: Secondary | ICD-10-CM

## 2021-05-05 DIAGNOSIS — E1165 Type 2 diabetes mellitus with hyperglycemia: Secondary | ICD-10-CM

## 2021-05-05 DIAGNOSIS — E1159 Type 2 diabetes mellitus with other circulatory complications: Secondary | ICD-10-CM

## 2021-05-05 DIAGNOSIS — E1169 Type 2 diabetes mellitus with other specified complication: Secondary | ICD-10-CM

## 2021-05-05 NOTE — Chronic Care Management (AMB) (Signed)
Chronic Care Management   CCM RN Visit Note  05/05/2021 Name: Duane Hill. MRN: [id number] DOB: 17-Jun-1940  Subjective: Duane Hill. is a 81 y.o. year old male who is a primary care patient of Jon Billings, NP. The care management team was consulted for assistance with disease management and care coordination needs.    Engaged with patient by telephone for follow up visit in response to provider referral for case management and/or care coordination services.   Consent to Services:  The patient was given information about Chronic Care Management services, agreed to services, and gave verbal consent prior to initiation of services.  Please see initial visit note for detailed documentation.   Patient agreed to services and verbal consent obtained.   Assessment: Review of patient past medical history, allergies, medications, health status, including review of consultants reports, laboratory and other test data, was performed as part of comprehensive evaluation and provision of chronic care management services.   SDOH (Social Determinants of Health) assessments and interventions performed:    CCM Care Plan  Allergies  Allergen Reactions   Protonix [Pantoprazole Sodium] Nausea And Vomiting    Outpatient Encounter Medications as of 05/05/2021  Medication Sig   acetaminophen (TYLENOL) 500 MG tablet Take 1,000 mg by mouth every 6 (six) hours as needed. Patient uss Caplet extra strength   albuterol (VENTOLIN HFA) 108 (90 Base) MCG/ACT inhaler Take 2 puffs every 4-6 hours as necessary for shortness of breath   atorvastatin (LIPITOR) 20 MG tablet Take 1 tablet (20 mg total) by mouth daily.   Azelastine HCl 0.15 % SOLN Place 2 sprays into both nostrils 2 (two) times daily. Place 2 sprays into both nostrils 2 (two) times daily.   carvedilol (COREG) 25 MG tablet Take 1 tablet (25 mg total) by mouth 2 (two) times daily with a meal.   cetirizine (ZYRTEC) 10 MG tablet Take 1 tablet  (10 mg total) by mouth daily.   Choline Fenofibrate (FENOFIBRIC ACID) 135 MG CPDR Take 1 capsule by mouth daily.   fluticasone (FLONASE) 50 MCG/ACT nasal spray Place 1 spray into both nostrils 2 (two) times daily.   gabapentin (NEURONTIN) 300 MG capsule Take 2 capsules (600 mg total) by mouth 2 (two) times daily.   glipiZIDE (GLUCOTROL) 5 MG tablet Take 5 mg by mouth 2 (two) times daily before a meal. (Patient not taking: Reported on 01/12/2021)   glucose blood test strip CHECK BLOOD SUGAR TWICE DAILY.   hydrochlorothiazide (HYDRODIURIL) 25 MG tablet Take 1 tablet (25 mg total) by mouth daily.   metFORMIN (GLUCOPHAGE-XR) 500 MG 24 hr tablet Take 1 tablet (500 mg total) by mouth in the morning and at bedtime.   Microlet Lancets MISC USE TWICE DAILY AS DIRECTED.   montelukast (SINGULAIR) 10 MG tablet Take 1 tablet (10 mg total) by mouth at bedtime.   nitrofurantoin, macrocrystal-monohydrate, (MACROBID) 100 MG capsule Take 1 capsule (100 mg total) by mouth 2 (two) times daily.   omeprazole (PRILOSEC) 40 MG capsule Take 1 capsule (40 mg total) by mouth daily.   quinapril (ACCUPRIL) 40 MG tablet Take 1 tablet (40 mg total) by mouth daily.   No facility-administered encounter medications on file as of 05/05/2021.    Patient Active Problem List   Diagnosis Date Noted   B12 deficiency 04/29/2020   HSV (herpes simplex virus) infection 05/10/2019   Allergic rhinitis 02/14/2019   Advanced care planning/counseling discussion 11/29/2016   GERD (gastroesophageal reflux disease) 11/05/2014   Hyperlipidemia associated  with type 2 diabetes mellitus ([place]) 11/05/2014   Chronic radiation cystitis 11/05/2014   Hypertension associated with diabetes ([place]) 02/02/2013   Type 2 diabetes mellitus with hyperglycemia ([place]) 02/02/2013   History of prostate cancer 02/02/2013    Conditions to be addressed/monitored:HTN, HLD, DMII, and Chronic pain, falls and safety concerns  Care Plan : RNCM: General Plan of Care  (Adult) for Chronic Disease Management and Care Coordination Needs  Updates made by Vanita Ingles, RN since 05/05/2021 12:00 AM     Problem: RNCM: Development of Plan of Care for Chronic Disease Management (DM, HTM, HLD, Chronic pain, Falls and safety)   Priority: High     Long-Range Goal: RNCM: Effective Management  of Plan of Care for Chronic Disease Management (DM, HTM, HLD, Chronic pain, Falls and safety)   Start Date: 03/03/2021  Expected End Date: 03/03/2022  Priority: High  Note:   Current Barriers:  Knowledge Deficits related to plan of care for management of HTN, HLD, DMII, Falls and Safety Concerns, and Chronic pain  Care Coordination needs related to Limited social support, Transportation, Level of care concerns, ADL IADL limitations, Social Isolation, and Inability to perform IADL's independently  Chronic Disease Management support and education needs related to HTN, HLD, DMII, Falls and safety concerns, and Chronic pain  Lacks caregiver support.        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   RNCM Clinical Goal(s):  Patient will verbalize understanding of plan for management of HTN, HLD, DMII, and chronic pain and falls as evidenced by compliant with plan of care, working with the CCM team to optimize health and well being take all medications exactly as prescribed and will call provider for medication related questions as evidenced by taking medications as directed and calling for refills before running out of medications     attend all scheduled medical appointments: The patient does not have any upcoming appointments with the pcp, knows to call for new needs or concerns as evidenced by keeping appointments         demonstrate improved and ongoing adherence to prescribed treatment plan for HTN, HLD, DMII, and Chronic pain, falls, and safety as evidenced by reporting new falls, following the plan of care and working with the CCM team to effectively manage health and well  being  demonstrate improved and ongoing health management independence as evidenced by working with the CCM team to optimize health and well being        demonstrate a decrease in HTN, HLD, DMII, and Chronic pains and falls and safety exacerbations  as evidenced by taking medications as prescribed, adherence with medications and dietary restrictions  demonstrate ongoing self health care management ability for effective management of chronic conditions  as evidenced by working with CCM team through collaboration with Consulting civil engineer, provider, and care team.   Interventions: 1:1 collaboration with primary care provider regarding development and update of comprehensive plan of care as evidenced by provider attestation and co-signature Inter-disciplinary care team collaboration (see longitudinal plan of care) Evaluation of current treatment plan related to  self management and patient's adherence to plan as established by provider   Diabetes:  (Status: Goal on Track (progressing): YES.) Long Term Goal   Lab Results  Component Value Date   HGBA1C 6.9 (H) 01/05/2021  Assessed patient's understanding of A1c goal: <7% Provided education to patient about basic DM disease process; Reviewed medications with patient and discussed importance of medication adherence. 05-05-2021: The patient has delivery  of medications to his home;        Reviewed prescribed diet with patient heart healthy/ADA. 05-05-2021: Compliant with heart healthy/ADA diet ; Counseled on importance of regular laboratory monitoring as prescribed. 05-05-2021: The patient has regular lab work;        Discussed plans with patient for ongoing care management follow up and provided patient with direct contact information for care management team;      Provided patient with written educational materials related to hypo and hyperglycemia and importance of correct treatment. 05-05-2021: The patient denies any highs or lows. Does not check often but  knows what sx and sx to look for with changes in blood sugars;       Reviewed scheduled/upcoming provider appointments including: 04-06-2021 at 1:20 pm, had to cancel his appointment due to transportation. No new appointments. Knows to call for changes.        Advised patient, providing education and rationale, to check cbg as directed  and record. 03-03-2021: The patient states his blood sugar this am was 189 and later 111. 05-05-2021: The patient states a couple of days ago his blood sugar is 139.  He does not worry about his levels.        call provider for findings outside established parameters;       Review of patient status, including review of consultants reports, relevant laboratory and other test results, and medications completed;       Screening for signs and symptoms of depression related to chronic disease state;        Assessed social determinant of health barriers;         Falls:  (Status: Goal on Track (progressing): YES.) Long Term Goal  Provided written and verbal education re: potential causes of falls and Fall prevention strategies Reviewed medications and discussed potential side effects of medications such as dizziness and frequent urination Advised patient of importance of notifying provider of falls. 03-03-2021: Education on the importance of notifying the provider of falls. The patient is very high fall risk. The patient does not always use his cane. Encouraged the patient to use DME and be mindful of safety at all times. The patient has worked with PT and that was helpful. He says he gets off balance a lot. Reviewed with the patient safety concerns with living by himself. The patient had mentioned assisted living but states he cannot leave his animals. Empathetic listening and support given.  Assessed for signs and symptoms of orthostatic hypotension Assessed for falls since last encounter. 03-03-2021: The patient states his last fall was last week when he was getting his  groceries in. Has had several falls since the last outreach with RNCM. 05-05-2021: The patient states that he has not had any new falls since the last outreach. He states his sisters car is broke down and she does not have the money to fix it. Her daughter goes and gets her groceries and when she gets her groceries she picks up groceries for the patient. He only goes over to his sisters sometimes. The patient states he is being careful and does not go outside a lot because he is concerned about falls and getting hurt.  Assessed patients knowledge of fall risk prevention secondary to previously provided education Provided patient information for fall alert systems Assessed working status of life alert bracelet and patient adherence Advised patient to discuss new falls and safety concerns with provider Screening for signs and symptoms of depression related to chronic disease state  Assessed social determinant of health barriers Care guide referral for local resources that will deliver groceries to his home due to the inability to go to the grocery store. Has transportation resources.   Hyperlipidemia:  (Status: Goal on Track (progressing): YES.) Long Term Goal  Lab Results  Component Value Date   CHOL 156 01/05/2021   HDL 23 (L) 01/05/2021   LDLCALC 83 01/05/2021   TRIG 303 (H) 01/05/2021   CHOLHDL 6.8 (H) 01/05/2021     Medication review performed; medication list updated in electronic medical record.  Provider established cholesterol goals reviewed; Counseled on importance of regular laboratory monitoring as prescribed. 05-05-2021: Education on cholesterol goals; Provided HLD educational materials; Reviewed role and benefits of statin for ASCVD risk reduction; Discussed strategies to manage statin-induced myalgias; Reviewed importance of limiting foods high in cholesterol. 05-05-2021: The patient does watch his sweets but also states that he is 30 and he is going to enjoy his food and eat things  he likes. Education and support given; Reviewed exercise goals and target of 150 minutes per week;  Hypertension: (Status: Goal on Track (progressing): YES.) Last practice recorded BP readings:  BP Readings from Last 3 Encounters:  01/05/21 (!) 142/78  10/03/20 (!) 148/70  04/28/20 128/76  Most recent eGFR/CrCl:  Lab Results  Component Value Date   EGFR 46 (L) 01/05/2021    No components found for: CRCL  Evaluation of current treatment plan related to hypertension self management and patient's adherence to plan as established by provider. 05-05-2021: The patient states that he does not generally check his blood pressure at home. He denies any new concerns with his HTN or heart health. Feels he is doing well. Knows to call for changes.    Provided education to patient re: stroke prevention, s/s of heart attack and stroke; Reviewed prescribed diet heart healthy/ADA diet. 05-05-2021: Review and education given  Reviewed medications with patient and discussed importance of compliance;  Discussed plans with patient for ongoing care management follow up and provided patient with direct contact information for care management team; Advised patient, providing education and rationale, to monitor blood pressure daily and record, calling PCP for findings outside established parameters;  Provided education on prescribed diet heart healthy/ADA ;  Discussed complications of poorly controlled blood pressure such as heart disease, stroke, circulatory complications, vision complications, kidney impairment, sexual dysfunction;   Pain:  (Status: Goal on Track (progressing): YES.) Long Term Goal  Pain assessment performed. 03-03-2021: The patient states that he has pain daily and when he is not sitting it is at a 10. The patient states that he has it on his right side. He has scoliosis and he had a doctor tell him a long time ago when he got older he would have issues with back pain. Review of daily routine and  effective management of back pain. 05-05-2021: Denies any pain today. The patient states he does have pain at times and the pain he was having was related to the fall he had back in November. He knows how to effectively manage pain and discomfort.  Medications reviewed. 03-03-2021: The patient states he only takes ibuprofen for back pain and changes position. States that this is something he has learned to live with.  Reviewed provider established plan for pain management. 03-03-2021: The patient takes ibuprofen for pain management of chronic back pain and also changes position when needed  ; Discussed importance of adherence to all scheduled medical appointments; Counseled on the importance of reporting any/all  new or changed pain symptoms or management strategies to pain management provider; Advised patient to report to care team affect of pain on daily activities; Discussed use of relaxation techniques and/or diversional activities to assist with pain reduction (distraction, imagery, relaxation, massage, acupressure, TENS, heat, and cold application; Reviewed with patient prescribed pharmacological and nonpharmacological pain relief strategies; Advised patient to discuss unresolved pain, changes in level or intensity of pain with provider;  Patient Goals/Self-Care Activities: Patient will self administer medications as prescribed as evidenced by self report/primary caregiver report  Patient will attend all scheduled provider appointments as evidenced by clinician review of documented attendance to scheduled appointments and patient/caregiver report Patient will call pharmacy for medication refills as evidenced by patient report and review of pharmacy fill history as appropriate Patient will attend church or other social activities as evidenced by patient report Patient will continue to perform ADL's independently as evidenced by patient/caregiver report Patient will continue to perform IADL's  independently as evidenced by patient/caregiver report Patient will call provider office for new concerns or questions as evidenced by review of documented incoming telephone call notes and patient report Patient will work with BSW to address care coordination needs and will continue to work with the clinical team to address health care and disease management related needs as evidenced by documented adherence to scheduled care management/care coordination appointments schedule appointment with eye doctor check blood sugar at prescribed times: when you have symptoms of low or high blood sugar, before and after exercise, and as directed by the pcp  check feet daily for cuts, sores or redness enter blood sugar readings and medication or insulin into daily log take the blood sugar log to all doctor visits trim toenails straight across drink 6 to 8 glasses of water each day eat fish at least once per week fill half of plate with vegetables limit fast food meals to no more than 1 per week manage portion size prepare main meal at home 3 to 5 days each week read food labels for fat, fiber, carbohydrates and portion size reduce red meat to 2 to 3 times a week set a realistic goal keep feet up while sitting wash and dry feet carefully every day wear comfortable, cotton socks wear comfortable, well-fitting shoes - check blood pressure weekly - choose a place to take my blood pressure (home, clinic or office, retail store) - write blood pressure results in a log or diary - learn about high blood pressure - keep a blood pressure log - take blood pressure log to all doctor appointments - call doctor for signs and symptoms of high blood pressure - develop an action plan for high blood pressure - keep all doctor appointments - take medications for blood pressure exactly as prescribed - report new symptoms to your doctor - eat more whole grains, fruits and vegetables, lean meats and healthy fats -  call for medicine refill 2 or 3 days before it runs out - take all medications exactly as prescribed - call doctor with any symptoms you believe are related to your medicine - call doctor when you experience any new symptoms - go to all doctor appointments as scheduled - adhere to prescribed diet: heart healthy/ADA        Plan:Telephone follow up appointment with care management team member scheduled for:  07-08-2021 at 230 pm  [place], MSN, [facility] Mobile: [phone]

## 2021-05-05 NOTE — Patient Instructions (Signed)
Visit Information  Thank you for taking time to visit with me today. Please don't hesitate to contact me if I can be of assistance to you before our next scheduled telephone appointment.  Following are the goals we discussed today:  RNCM Clinical Goal(s):  Patient will verbalize understanding of plan for management of HTN, HLD, DMII, and chronic pain and falls as evidenced by compliant with plan of care, working with the CCM team to optimize health and well being take all medications exactly as prescribed and will call provider for medication related questions as evidenced by taking medications as directed and calling for refills before running out of medications     attend all scheduled medical appointments: The patient does not have any upcoming appointments with the pcp, knows to call for new needs or concerns as evidenced by keeping appointments         demonstrate improved and ongoing adherence to prescribed treatment plan for HTN, HLD, DMII, and Chronic pain, falls, and safety as evidenced by reporting new falls, following the plan of care and working with the CCM team to effectively manage health and well being  demonstrate improved and ongoing health management independence as evidenced by working with the CCM team to optimize health and well being        demonstrate a decrease in HTN, HLD, DMII, and Chronic pains and falls and safety exacerbations  as evidenced by taking medications as prescribed, adherence with medications and dietary restrictions  demonstrate ongoing self health care management ability for effective management of chronic conditions  as evidenced by working with CCM team through collaboration with Consulting civil engineer, provider, and care team.    Interventions: 1:1 collaboration with primary care provider regarding development and update of comprehensive plan of care as evidenced by provider attestation and co-signature Inter-disciplinary care team collaboration (see  longitudinal plan of care) Evaluation of current treatment plan related to  self management and patient's adherence to plan as established by provider     Diabetes:  (Status: Goal on Track (progressing): YES.) Long Term Goal         Lab Results  Component Value Date    HGBA1C 6.9 (H) 01/05/2021  Assessed patient's understanding of A1c goal: <7% Provided education to patient about basic DM disease process; Reviewed medications with patient and discussed importance of medication adherence. 05-05-2021: The patient has delivery of medications to his home;        Reviewed prescribed diet with patient heart healthy/ADA. 05-05-2021: Compliant with heart healthy/ADA diet ; Counseled on importance of regular laboratory monitoring as prescribed. 05-05-2021: The patient has regular lab work;        Discussed plans with patient for ongoing care management follow up and provided patient with direct contact information for care management team;      Provided patient with written educational materials related to hypo and hyperglycemia and importance of correct treatment. 05-05-2021: The patient denies any highs or lows. Does not check often but knows what sx and sx to look for with changes in blood sugars;       Reviewed scheduled/upcoming provider appointments including: 04-06-2021 at 1:20 pm, had to cancel his appointment due to transportation. No new appointments. Knows to call for changes.        Advised patient, providing education and rationale, to check cbg as directed  and record. 03-03-2021: The patient states his blood sugar this am was 189 and later 111. 05-05-2021: The patient states a couple of days  ago his blood sugar is 139.  He does not worry about his levels.        call provider for findings outside established parameters;       Review of patient status, including review of consultants reports, relevant laboratory and other test results, and medications completed;       Screening for signs and  symptoms of depression related to chronic disease state;        Assessed social determinant of health barriers;          Falls:  (Status: Goal on Track (progressing): YES.) Long Term Goal  Provided written and verbal education re: potential causes of falls and Fall prevention strategies Reviewed medications and discussed potential side effects of medications such as dizziness and frequent urination Advised patient of importance of notifying provider of falls. 03-03-2021: Education on the importance of notifying the provider of falls. The patient is very high fall risk. The patient does not always use his cane. Encouraged the patient to use DME and be mindful of safety at all times. The patient has worked with PT and that was helpful. He says he gets off balance a lot. Reviewed with the patient safety concerns with living by himself. The patient had mentioned assisted living but states he cannot leave his animals. Empathetic listening and support given.  Assessed for signs and symptoms of orthostatic hypotension Assessed for falls since last encounter. 03-03-2021: The patient states his last fall was last week when he was getting his groceries in. Has had several falls since the last outreach with RNCM. 05-05-2021: The patient states that he has not had any new falls since the last outreach. He states his sisters car is broke down and she does not have the money to fix it. Her daughter goes and gets her groceries and when she gets her groceries she picks up groceries for the patient. He only goes over to his sisters sometimes. The patient states he is being careful and does not go outside a lot because he is concerned about falls and getting hurt.  Assessed patients knowledge of fall risk prevention secondary to previously provided education Provided patient information for fall alert systems Assessed working status of life alert bracelet and patient adherence Advised patient to discuss new falls and  safety concerns with provider Screening for signs and symptoms of depression related to chronic disease state Assessed social determinant of health barriers Care guide referral for local resources that will deliver groceries to his home due to the inability to go to the grocery store. Has transportation resources.    Hyperlipidemia:  (Status: Goal on Track (progressing): YES.) Long Term Goal       Lab Results  Component Value Date    CHOL 156 01/05/2021    HDL 23 (L) 01/05/2021    LDLCALC 83 01/05/2021    TRIG 303 (H) 01/05/2021    CHOLHDL 6.8 (H) 01/05/2021      Medication review performed; medication list updated in electronic medical record.  Provider established cholesterol goals reviewed; Counseled on importance of regular laboratory monitoring as prescribed. 05-05-2021: Education on cholesterol goals; Provided HLD educational materials; Reviewed role and benefits of statin for ASCVD risk reduction; Discussed strategies to manage statin-induced myalgias; Reviewed importance of limiting foods high in cholesterol. 05-05-2021: The patient does watch his sweets but also states that he is 89 and he is going to enjoy his food and eat things he likes. Education and support given; Reviewed exercise goals and target  of 150 minutes per week;   Hypertension: (Status: Goal on Track (progressing): YES.) Last practice recorded BP readings:     BP Readings from Last 3 Encounters:  01/05/21 (!) 142/78  10/03/20 (!) 148/70  04/28/20 128/76  Most recent eGFR/CrCl:       Lab Results  Component Value Date    EGFR 46 (L) 01/05/2021    No components found for: CRCL   Evaluation of current treatment plan related to hypertension self management and patient's adherence to plan as established by provider. 05-05-2021: The patient states that he does not generally check his blood pressure at home. He denies any new concerns with his HTN or heart health. Feels he is doing well. Knows to call for changes.     Provided education to patient re: stroke prevention, s/s of heart attack and stroke; Reviewed prescribed diet heart healthy/ADA diet. 05-05-2021: Review and education given  Reviewed medications with patient and discussed importance of compliance;  Discussed plans with patient for ongoing care management follow up and provided patient with direct contact information for care management team; Advised patient, providing education and rationale, to monitor blood pressure daily and record, calling PCP for findings outside established parameters;  Provided education on prescribed diet heart healthy/ADA ;  Discussed complications of poorly controlled blood pressure such as heart disease, stroke, circulatory complications, vision complications, kidney impairment, sexual dysfunction;    Pain:  (Status: Goal on Track (progressing): YES.) Long Term Goal  Pain assessment performed. 03-03-2021: The patient states that he has pain daily and when he is not sitting it is at a 10. The patient states that he has it on his right side. He has scoliosis and he had a doctor tell him a long time ago when he got older he would have issues with back pain. Review of daily routine and effective management of back pain. 05-05-2021: Denies any pain today. The patient states he does have pain at times and the pain he was having was related to the fall he had back in November. He knows how to effectively manage pain and discomfort.  Medications reviewed. 03-03-2021: The patient states he only takes ibuprofen for back pain and changes position. States that this is something he has learned to live with.  Reviewed provider established plan for pain management. 03-03-2021: The patient takes ibuprofen for pain management of chronic back pain and also changes position when needed  ; Discussed importance of adherence to all scheduled medical appointments; Counseled on the importance of reporting any/all new or changed pain symptoms or  management strategies to pain management provider; Advised patient to report to care team affect of pain on daily activities; Discussed use of relaxation techniques and/or diversional activities to assist with pain reduction (distraction, imagery, relaxation, massage, acupressure, TENS, heat, and cold application; Reviewed with patient prescribed pharmacological and nonpharmacological pain relief strategies; Advised patient to discuss unresolved pain, changes in level or intensity of pain with provider;   Patient Goals/Self-Care Activities: Patient will self administer medications as prescribed as evidenced by self report/primary caregiver report  Patient will attend all scheduled provider appointments as evidenced by clinician review of documented attendance to scheduled appointments and patient/caregiver report Patient will call pharmacy for medication refills as evidenced by patient report and review of pharmacy fill history as appropriate Patient will attend church or other social activities as evidenced by patient report Patient will continue to perform ADL's independently as evidenced by patient/caregiver report Patient will continue to perform IADL's independently  as evidenced by patient/caregiver report Patient will call provider office for new concerns or questions as evidenced by review of documented incoming telephone call notes and patient report Patient will work with BSW to address care coordination needs and will continue to work with the clinical team to address health care and disease management related needs as evidenced by documented adherence to scheduled care management/care coordination appointments schedule appointment with eye doctor check blood sugar at prescribed times: when you have symptoms of low or high blood sugar, before and after exercise, and as directed by the pcp  check feet daily for cuts, sores or redness enter blood sugar readings and medication or insulin  into daily log take the blood sugar log to all doctor visits trim toenails straight across drink 6 to 8 glasses of water each day eat fish at least once per week fill half of plate with vegetables limit fast food meals to no more than 1 per week manage portion size prepare main meal at home 3 to 5 days each week read food labels for fat, fiber, carbohydrates and portion size reduce red meat to 2 to 3 times a week set a realistic goal keep feet up while sitting wash and dry feet carefully every day wear comfortable, cotton socks wear comfortable, well-fitting shoes - check blood pressure weekly - choose a place to take my blood pressure (home, clinic or office, retail store) - write blood pressure results in a log or diary - learn about high blood pressure - keep a blood pressure log - take blood pressure log to all doctor appointments - call doctor for signs and symptoms of high blood pressure - develop an action plan for high blood pressure - keep all doctor appointments - take medications for blood pressure exactly as prescribed - report new symptoms to your doctor - eat more whole grains, fruits and vegetables, lean meats and healthy fats - call for medicine refill 2 or 3 days before it runs out - take all medications exactly as prescribed - call doctor with any symptoms you believe are related to your medicine - call doctor when you experience any new symptoms - go to all doctor appointments as scheduled - adhere to prescribed diet: heart healthy/ADA       Our next appointment is by telephone on 07-08-2021 at 230 pm  Please call the care guide team at [phone] if you need to cancel or reschedule your appointment.   If you are experiencing a Mental Health or [place] or need someone to talk to, please call the Suicide and Crisis Lifeline: 988 call the [place] National Suicide Prevention Lifeline: 3216094874 or TTY: 234 241 3288 TTY (450)700-9183) to  talk to a trained counselor call 1-800-273-TALK (toll free, 24 hour hotline)   Patient verbalizes understanding of instructions and care plan provided today and agrees to view in [place]. Active MyChart status confirmed with patient.    Noreene Larsson RN, MSN, [facility] Mobile: [phone]

## 2021-05-13 ENCOUNTER — Telehealth: Payer: Medicare Other

## 2021-05-14 ENCOUNTER — Telehealth: Payer: Self-pay | Admitting: Licensed Clinical Social Worker

## 2021-05-14 ENCOUNTER — Telehealth: Payer: Medicare Other

## 2021-05-14 NOTE — Telephone Encounter (Signed)
° °   Clinical Social Work  Care Management   Phone Outreach    05/14/2021 Name: Duane Hill. MRN: [id number] DOB: May 05, 1940  Duane Hill. is a 81 y.o. year old male who is a primary care patient of Jon Billings, NP .   Reason for referral: Mental Health Counseling and Resources.    F/U phone call today to assess needs, progress and barriers with care plan goals.   Telephone outreach was unsuccessful. A HIPPA compliant phone message was left for the patient providing contact information and requesting a return call.   Plan:CCM LCSW will wait for return call. If no return call is received, LCSW will attempt within 30 days  Review of patient status, including review of consultants reports, relevant laboratory and other test results, and collaboration with appropriate care team members and the patient's provider was performed as part of comprehensive patient evaluation and provision of care management services.    Christa See, MSW, [email or web address] Phone [phone] 4:56 PM

## 2021-05-19 ENCOUNTER — Encounter: Payer: Self-pay | Admitting: Nurse Practitioner

## 2021-05-20 ENCOUNTER — Telehealth: Payer: Self-pay | Admitting: *Deleted

## 2021-05-20 NOTE — Telephone Encounter (Signed)
° °  Telephone encounter was:  Unsuccessful.  05/20/2021 Name: Duane Hill. MRN: [id number] DOB: 06-25-1940  Unsuccessful outbound call made today to assist with:   Food delivery   Outreach Attempt:  1st Attempt  Mailbox is full  [place], Care Management  [phone] [address] , [place] [address] Email : [email or web address]

## 2021-05-21 ENCOUNTER — Ambulatory Visit: Payer: Medicare Other | Admitting: Nurse Practitioner

## 2021-05-21 NOTE — Progress Notes (Deleted)
° °  There were no vitals taken for this visit.   Subjective:    Patient ID: Duane Edelman., male    DOB: 17-May-1940, 81 y.o.   MRN: [id number]  HPI: Duane Whittinghill. is a 81 y.o. male  No chief complaint on file.   Relevant past medical, surgical, family and social history reviewed and updated as indicated. Interim medical history since our last visit reviewed. Allergies and medications reviewed and updated.  Review of Systems  Per HPI unless specifically indicated above     Objective:    There were no vitals taken for this visit.  Wt Readings from Last 3 Encounters:  01/05/21 170 lb 6.4 oz (77.3 kg)  10/03/20 167 lb 6 oz (75.9 kg)  04/28/20 172 lb (78 kg)    Physical Exam  Results for orders placed or performed in visit on 01/07/21  HM DIABETES EYE EXAM  Result Value Ref Range   HM Diabetic Eye Exam No Retinopathy No Retinopathy      Assessment & Plan:   Problem List Items Addressed This Visit   None    Follow up plan: No follow-ups on file.

## 2021-05-22 ENCOUNTER — Telehealth: Payer: Self-pay | Admitting: *Deleted

## 2021-05-22 ENCOUNTER — Other Ambulatory Visit: Payer: Self-pay | Admitting: Nurse Practitioner

## 2021-05-22 NOTE — Telephone Encounter (Signed)
° °  Telephone encounter was:  Successful.  05/22/2021 Name: Duane Hill. MRN: [id number] DOB: 14-Nov-1940  Duane Hill. is a 81 y.o. year old male who is a primary care patient of Jon Billings, NP . The community resource team was consulted for assistance with  Food delivery   Care guide performed the following interventions: Patient provided with information about care guide support team and interviewed to confirm resource needs Follow up call placed to community resources to determine status of patients referral.Patient was outreached and provided information about [facility] delivery and also will reach out to see if he was interested in being placed  onthe waitlist on [place] meals on wheels   Follow Up Plan:  Care guide will follow up with patient by phone over the next few days  [place], Care Management  [phone] [address] , [place] [address] Email : [email or web address]

## 2021-05-22 NOTE — Telephone Encounter (Signed)
Requested medications are due for refill today.  yes  Requested medications are on the active medications list.  yes  Last refill. 05/08/2020  Future visit scheduled.   no  Notes to clinic.  Failed protocol d/t abnormal labs.    Requested Prescriptions  Pending Prescriptions Disp Refills   cetirizine (ZYRTEC) 10 MG tablet [Pharmacy Med Name: CETIRIZINE HCL 10 MG TABLET] 90 tablet 0    Sig: Take 1 tablet (10 mg total) by mouth daily.     Ear, Nose, and Throat:  Antihistamines 2 Failed - 05/22/2021  6:23 PM      Failed - Cr in normal range and within 360 days    Creatinine  Date Value Ref Range Status  08/16/2014 1.11 mg/dL Final    Comment:    0.61-1.24 NOTE: New Reference Range  06/25/14    Creatinine, Ser  Date Value Ref Range Status  01/05/2021 1.55 (H) 0.76 - 1.27 mg/dL Final          Passed - Valid encounter within last 12 months    Recent Outpatient Visits           4 months ago Hypertension associated with diabetes ([place])   [facility] Jon Billings, NP   7 months ago Hypertension associated with diabetes ([place])   [facility] Jon Billings, NP   1 year ago Type 2 diabetes mellitus with hyperglycemia, without long-term current use of insulin ([place])   [place], [place] T, NP   1 year ago Type 2 diabetes mellitus with hyperglycemia, without long-term current use of insulin ([place])   [place], Megan P, DO   1 year ago Type 2 diabetes mellitus with hyperglycemia, without long-term current use of insulin [facility])   [place], [place], [place]

## 2021-05-25 ENCOUNTER — Telehealth: Payer: Self-pay | Admitting: *Deleted

## 2021-05-25 NOTE — Telephone Encounter (Signed)
° °  Telephone encounter was:  Successful.  05/25/2021 Name: Duane Hill. MRN: [id number] DOB: Aug 22, 1940  Duane Hill. is a 81 y.o. year old male who is a primary care patient of Jon Billings, NP . The community resource team was consulted for assistance with Patient received Meals on Wheels from [employer] senior services coordinator let kohim know today and he is going to use [facility] delivery for other grocery need  Care guide performed the following interventions: Patient provided with information about care guide support team and interviewed to confirm resource needs Obtained verbal consent to place patient referral to meals on wheels  Follow up call placed to community resources to determine status of patients referral.  Follow Up Plan:  No further follow up planned at this time. The patient has been provided with needed resources. [place], Care Management  [phone] [address] , [place] [address] Email : [email or web address]

## 2021-05-25 NOTE — Telephone Encounter (Signed)
Lmom asking pt to call back to schedule an appt. °

## 2021-05-26 ENCOUNTER — Telehealth: Payer: Self-pay

## 2021-05-26 NOTE — Telephone Encounter (Signed)
° °  Telephone encounter was:  Successful.  05/26/2021 Name: Duane Hill. MRN: [id number] DOB: October 25, 1940  Duane Hill. is a 81 y.o. year old male who is a primary care patient of Jon Billings, NP . The community resource team was consulted for assistance with Transportation Needs   Care guide performed the following interventions:  Patient advised he is not really interested in ACTA or any services where that are public. I did advised pt the only non pubic transportation was Catering manager as he does not have insurance transportation.  Follow Up Plan:  No further follow up planned at this time. The patient has been provided with needed resources.  [place] management  [place], [place]  Main Phone: [phone]   E-mail: [email or web address]  Website: [email or web address]

## 2021-05-26 NOTE — Telephone Encounter (Deleted)
° °  Telephone encounter was:  Successful.  05/26/2021 Name: Duane Hill. MRN: [id number] DOB: 04-02-1941  Duane Hill. is a 81 y.o. year old male who is a primary care patient of Jon Billings, NP . The community resource team was consulted for assistance with Transportation Needs   Care guide performed the following interventions:  Patient advised he is not really interested in ACTA or any services where that are public. I did advised pt the only non pubic transportation was Catering manager and insurance transportation.  Follow Up Plan:  No further follow up planned at this time. The patient has been provided with needed resources.  [place] management  [place], [place]  Main Phone: [phone]   E-mail: [email or web address]  Website: [email or web address]

## 2021-06-10 ENCOUNTER — Telehealth: Payer: Medicare Other

## 2021-06-11 ENCOUNTER — Ambulatory Visit: Payer: Medicare Other | Admitting: Nurse Practitioner

## 2021-06-15 ENCOUNTER — Telehealth: Payer: Self-pay | Admitting: Nurse Practitioner

## 2021-06-15 DIAGNOSIS — I10 Essential (primary) hypertension: Secondary | ICD-10-CM

## 2021-06-16 NOTE — Telephone Encounter (Signed)
Requested medication (s) are due for refill today:   Yes for both  Requested medication (s) are on the active medication list:   Yes for both  Future visit scheduled:   No   Last ordered: Prilosec 03/15/2021 #90, 0 refills;  Accupril 02/27/2021 #90, 0 refills  Returned because protocol criteria not met.  Labs out of range   Requested Prescriptions  Pending Prescriptions Disp Refills   omeprazole (PRILOSEC) 40 MG capsule [Pharmacy Med Name: OMEPRAZOLE DR 40 MG CAPSULE] 90 capsule 0    Sig: Take 1 capsule (40 mg total) by mouth daily.     Gastroenterology: Proton Pump Inhibitors Passed - 06/15/2021 10:52 AM      Passed - Valid encounter within last 12 months    Recent Outpatient Visits           5 months ago Hypertension associated with diabetes [facility])   [facility] Jon Billings, NP   8 months ago Hypertension associated with diabetes ([place])   [facility] Jon Billings, NP   1 year ago Type 2 diabetes mellitus with hyperglycemia, without long-term current use of insulin ([place])   [place], Nolic T, NP   1 year ago Type 2 diabetes mellitus with hyperglycemia, without long-term current use of insulin ([place])   [place], Megan P, DO   1 year ago Type 2 diabetes mellitus with hyperglycemia, without long-term current use of insulin [facility])   [facility], [place], [place]               quinapril (ACCUPRIL) 40 MG tablet [Pharmacy Med Name: QUINAPRIL 40 MG TABLET] 90 tablet 0    Sig: Take 1 tablet (40 mg total) by mouth daily.     Cardiovascular: ACE Inhibitors 2 Failed - 06/15/2021 10:52 AM      Failed - Cr in normal range and within 180 days    Creatinine  Date Value Ref Range Status  08/16/2014 1.11 mg/dL Final    Comment:    0.61-1.24 NOTE: New Reference Range  06/25/14    Creatinine, Ser  Date Value Ref Range Status  01/05/2021 1.55 (H) 0.76 - 1.27 mg/dL Final           Failed - Last BP in normal range    BP Readings from Last 1 Encounters:  01/05/21 (!) 142/78          Passed - K in normal range and within 180 days    Potassium  Date Value Ref Range Status  01/05/2021 4.9 3.5 - 5.2 mmol/L Final  08/16/2014 3.5 mmol/L Final    Comment:    3.5-5.1 NOTE: New Reference Range  06/25/14           Passed - eGFR is 30 or above and within 180 days    EGFR (African American)  Date Value Ref Range Status  08/16/2014 >60  Final   GFR calc Af Amer  Date Value Ref Range Status  04/28/2020 56 (L) >59 mL/min/1.73 Final    Comment:    **In accordance with recommendations from the NKF-ASN Task force,**   [facility] is in the process of updating its eGFR calculation to the   2021 CKD-EPI creatinine equation that estimates kidney function   without a race variable.    EGFR (Non-African Amer.)  Date Value Ref Range Status  08/16/2014 >60  Final    Comment:    eGFR values <50m/min/1.73 m2 may be an indication of  chronic kidney disease (CKD). Calculated eGFR is useful in patients with stable renal function. The eGFR calculation will not be reliable in acutely ill patients when serum creatinine is changing rapidly. It is not useful in patients on dialysis. The eGFR calculation may not be applicable to patients at the low and high extremes of body sizes, pregnant women, and vegetarians.    GFR calc non Af Amer  Date Value Ref Range Status  04/28/2020 48 (L) >59 mL/min/1.73 Final   eGFR  Date Value Ref Range Status  01/05/2021 46 (L) >59 mL/min/1.73 Final          Passed - Patient is not pregnant      Passed - Valid encounter within last 6 months    Recent Outpatient Visits           5 months ago Hypertension associated with diabetes ([place])   [facility] Jon Billings, NP   8 months ago Hypertension associated with diabetes ([place])   [facility] Jon Billings, NP   1 year ago Type 2 diabetes mellitus with  hyperglycemia, without long-term current use of insulin ([place])   [place], Kinsman T, NP   1 year ago Type 2 diabetes mellitus with hyperglycemia, without long-term current use of insulin ([place])   [place], Megan P, DO   1 year ago Type 2 diabetes mellitus with hyperglycemia, without long-term current use of insulin [facility])   [place], [place], [place]

## 2021-06-17 ENCOUNTER — Telehealth: Payer: Self-pay

## 2021-06-17 ENCOUNTER — Other Ambulatory Visit: Payer: Self-pay | Admitting: Nurse Practitioner

## 2021-06-17 MED ORDER — LISINOPRIL 40 MG PO TABS: 40.0000 mg | ORAL_TABLET | Freq: Every day | ORAL | 1 refills | Status: DC

## 2021-06-17 NOTE — Telephone Encounter (Signed)
Error

## 2021-06-17 NOTE — Telephone Encounter (Signed)
Requested Prescriptions  ?Pending Prescriptions Disp Refills  ?? gabapentin (NEURONTIN) 300 MG capsule [Pharmacy Med Name: GABAPENTIN 300 MG CAPSULE] 360 capsule 0  ?  Sig: Take 2 capsules (600 mg total) by mouth 2 (two) times daily.  ?  ? Neurology: Anticonvulsants - gabapentin Failed - 06/17/2021 11:23 AM  ?  ?  Failed - Cr in normal range and within 360 days  ?  Creatinine  ?Date Value Ref Range Status  ?08/16/2014 1.11 mg/dL Final  ?  Comment:  ?  0.61-1.24 ?NOTE: New Reference Range ? 06/25/14 ?  ? ?Creatinine, Ser  ?Date Value Ref Range Status  ?01/05/2021 1.55 (H) 0.76 - 1.27 mg/dL Final  ?   ?  ?  Passed - Completed PHQ-2 or PHQ-9 in the last 360 days  ?  ?  Passed - Valid encounter within last 12 months  ?  Recent Outpatient Visits   ?      ? 5 months ago Hypertension associated with diabetes ([place])  ? [place], NP  ? 8 months ago Hypertension associated with diabetes [facility])  ? [place], NP  ? 1 year ago Type 2 diabetes mellitus with hyperglycemia, without long-term current use of insulin ([place])  ? [place], [place] T, NP  ? 1 year ago Type 2 diabetes mellitus with hyperglycemia, without long-term current use of insulin ([place])  ? [place], [place] P, DO  ? 1 year ago Type 2 diabetes mellitus with hyperglycemia, without long-term current use of insulin ([place])  ? [facility] [name], [place]  ?  ?  ? ?  ?  ?  ? ?

## 2021-06-17 NOTE — Telephone Encounter (Signed)
Received a fax from [place] stating that Quinapril was no longer on the market. They are requesting an alternative medication be sent in for the patient. Please advise.  ?

## 2021-06-17 NOTE — Telephone Encounter (Signed)
Patient is due for follow up.

## 2021-06-18 NOTE — Telephone Encounter (Signed)
Patient already has a CCM referral and Delana Meyer is who said she would help with transportation. ?

## 2021-06-22 ENCOUNTER — Other Ambulatory Visit: Payer: Self-pay | Admitting: Nurse Practitioner

## 2021-06-22 DIAGNOSIS — E785 Hyperlipidemia, unspecified: Secondary | ICD-10-CM

## 2021-06-23 ENCOUNTER — Telehealth: Payer: Self-pay | Admitting: Licensed Clinical Social Worker

## 2021-06-23 NOTE — Telephone Encounter (Signed)
Requested Prescriptions  ?Pending Prescriptions Disp Refills  ?? atorvastatin (LIPITOR) 20 MG tablet [Pharmacy Med Name: ATORVASTATIN 20 MG TABLET] 30 tablet 0  ?  Sig: Take 1 tablet (20 mg total) by mouth daily.  ?  ? Cardiovascular:  Antilipid - Statins Failed - 06/22/2021  1:50 PM  ?  ?  Failed - Lipid Panel in normal range within the last 12 months  ?  Cholesterol, Total  ?Date Value Ref Range Status  ?01/05/2021 156 100 - 199 mg/dL Final  ? ?Cholesterol Piccolo, [place]  ?Date Value Ref Range Status  ?05/25/2016 141 <200 mg/dL Final  ?  Comment:  ?                          Desirable                <200 ?                        Borderline High      200- 239 ?                        High                     >239 ?  ? ?LDL Chol Calc (NIH)  ?Date Value Ref Range Status  ?01/05/2021 83 0 - 99 mg/dL Final  ? ?HDL  ?Date Value Ref Range Status  ?01/05/2021 23 (L) >39 mg/dL Final  ? ?Triglycerides  ?Date Value Ref Range Status  ?01/05/2021 303 (H) 0 - 149 mg/dL Final  ? ?Triglycerides Piccolo,Waived  ?Date Value Ref Range Status  ?05/25/2016 280 (H) <150 mg/dL Final  ?  Comment:  ?                          Normal                   <150 ?                        Borderline High     150 - 199 ?                        High                200 - 499 ?                        Very High                >499 ?  ? ?  ?  ?  Passed - Patient is not pregnant  ?  ?  Passed - Valid encounter within last 12 months  ?  Recent Outpatient Visits   ?      ? 5 months ago Hypertension associated with diabetes ([place])  ? [place], NP  ? 8 months ago Hypertension associated with diabetes [facility])  ? [place], NP  ? 1 year ago Type 2 diabetes mellitus with hyperglycemia, without long-term current use of insulin ([place])  ? [place], [place] T, NP  ? 1 year ago Type 2 diabetes mellitus with hyperglycemia, without long-term current use of insulin ([place])  ? [facility] Johnson, Megan P, DO  ? 1 year ago Type 2 diabetes mellitus with hyperglycemia, without long-term current use of insulin ([place])  ? [place], [place], [place]  ?  ?  ?Future Appointments   ?        ? In 1 week  [facility], [facility]  ? In 1 week Jon Billings, NP [facility], [facility]  ?  ? ?  ?  ?  ? ? ?

## 2021-06-23 NOTE — Telephone Encounter (Signed)
? ?   Clinical Social Work  ?Care Management  ? Phone Outreach  ? ? ?06/23/2021 ?Name: Iker Nuttall. MRN: [id number] DOB: 1940/07/26 ? ?Ravon Mortellaro. is a 81 y.o. year old male who is a primary care patient of Jon Billings, NP .  ? ?Reason for referral: [employer] .   ? ?F/U phone call today to assess needs, progress and barriers with care plan goals.   Telephone outreach was unsuccessful. A HIPPA compliant phone message was left for the patient providing contact information and requesting a return call.  ? ?Plan:CCM LCSW will wait for return call. If no return call is received, LCSW will make another attempt on 06/24/21 ? ?Review of patient status, including review of consultants reports, relevant laboratory and other test results, and collaboration with appropriate care team members and the patient's provider was performed as part of comprehensive patient evaluation and provision of care management services.   ? ?Christa See, MSW, LCSW ?[place] Management ?[facility] ?[email or web address] ?Phone [phone] ?4:09 PM ? ? ? ?  ? ? ? ? ?

## 2021-06-24 ENCOUNTER — Ambulatory Visit (INDEPENDENT_AMBULATORY_CARE_PROVIDER_SITE_OTHER): Payer: Medicare Other | Admitting: Licensed Clinical Social Worker

## 2021-06-24 DIAGNOSIS — E1159 Type 2 diabetes mellitus with other circulatory complications: Secondary | ICD-10-CM

## 2021-06-24 DIAGNOSIS — E1165 Type 2 diabetes mellitus with hyperglycemia: Secondary | ICD-10-CM

## 2021-06-25 ENCOUNTER — Other Ambulatory Visit: Payer: Self-pay | Admitting: Nurse Practitioner

## 2021-06-25 DIAGNOSIS — I10 Essential (primary) hypertension: Secondary | ICD-10-CM

## 2021-06-25 NOTE — Telephone Encounter (Signed)
Requested medication (s) are due for refill today:   Yes ? ?Requested medication (s) are on the active medication list:   Yes ? ?Future visit scheduled:   Yes ? ? ?Last ordered: 02/27/2021 #180, 0 refills ? ?Returned because protocol criteria not met.   Cr. Is elevated.  ? ?Requested Prescriptions  ?Pending Prescriptions Disp Refills  ? carvedilol (COREG) 25 MG tablet [Pharmacy Med Name: CARVEDILOL 25 MG TABLET] 180 tablet 0  ?  Sig: Take 1 tablet (25 mg total) by mouth 2 (two) times daily with a meal.  ?  ? Cardiovascular: Beta Blockers 3 Failed - 06/25/2021 10:33 AM  ?  ?  Failed - Cr in normal range and within 360 days  ?  Creatinine  ?Date Value Ref Range Status  ?08/16/2014 1.11 mg/dL Final  ?  Comment:  ?  0.61-1.24 ?NOTE: New Reference Range ? 06/25/14 ?  ? ?Creatinine, Ser  ?Date Value Ref Range Status  ?01/05/2021 1.55 (H) 0.76 - 1.27 mg/dL Final  ?  ?  ?  ?  Failed - Last BP in normal range  ?  BP Readings from Last 1 Encounters:  ?01/05/21 (!) 142/78  ?  ?  ?  ?  Passed - AST in normal range and within 360 days  ?  AST  ?Date Value Ref Range Status  ?01/05/2021 31 0 - 40 IU/L Final  ? ?AST (SGOT) Piccolo, Waived  ?Date Value Ref Range Status  ?05/25/2016 27 11 - 38 U/L Final  ?  ?  ?  ?  Passed - ALT in normal range and within 360 days  ?  ALT  ?Date Value Ref Range Status  ?01/05/2021 30 0 - 44 IU/L Final  ? ?ALT (SGPT) Piccolo, Waived  ?Date Value Ref Range Status  ?05/25/2016 31 10 - 47 U/L Final  ?  ?  ?  ?  Passed - Last Heart Rate in normal range  ?  Pulse Readings from Last 1 Encounters:  ?01/05/21 (!) 55  ?  ?  ?  ?  Passed - Valid encounter within last 6 months  ?  Recent Outpatient Visits   ? ?      ? 5 months ago Hypertension associated with diabetes (HCC)  ? [facility] [place], Karen, NP  ? 8 months ago Hypertension associated with diabetes (HCC)  ? [facility] Holdsworth, Karen, NP  ? 1 year ago Type 2 diabetes mellitus with hyperglycemia, without long-term current  use of insulin (HCC)  ? [facility] [place]ady, Jolene T, NP  ? 1 year ago Type 2 diabetes mellitus with hyperglycemia, without long-term current use of insulin (HCC)  ? [facility] Johnson, Megan P, DO  ? 1 year ago Type 2 diabetes mellitus with hyperglycemia, without long-term current use of insulin (HCC)  ? [facility] [place], Rachel Elizabeth, PA-C  ? ?  ?  ?Future Appointments   ? ?        ? In 5 days  [facility], [facility]  ? In 6 days Holdsworth, Karen, NP [facility], [facility]  ? ?  ? ?  ?  ?  ? ?

## 2021-06-26 ENCOUNTER — Telehealth: Payer: Self-pay

## 2021-06-26 NOTE — Telephone Encounter (Signed)
? ?  Telephone encounter was:  Successful.  ?06/26/2021 ?Name: Duane Hill. MRN: [id number] DOB: Jun 18, 1940 ? ?Duane Hill. is a 81 y.o. year old male who is a primary care patient of Jon Billings, NP . The community resource team was consulted for assistance with Transportation Needs  ? ?Care guide performed the following interventions: Spoke with patient about sending a request for to [employer]. Emailed request for 06/30/21 9:45am appointment at [facility]. ? ?Follow Up Plan:   I will follow-up with [facility] transportation on 06/29/21. ? ?Sherise Geerdes, [place], CHC ?Care Guide  Embedded Care Coordination ?[place]  Care Management  ?[address] ?[place], [place] [address] ?[email or web address]  ?? [phone]   [email or web address] ?  ?

## 2021-06-26 NOTE — Patient Instructions (Signed)
Visit Information ? ?Thank you for taking time to visit with me today. Please don't hesitate to contact me if I can be of assistance to you before our next scheduled telephone appointment. ? ?Following are the goals we discussed today:  ?Patient Goals/Self-Care Activities: Over the next 120 days ?Utilize healthy coping skills and self-care strategies discussed ?Continue compliance with medications ?Attend all scheduled appointments with providers ?Contact clinic with any questions or concerns ? ?Our next appointment is by telephone on 08/19/21 at 10:45 AM ? ?Please call the care guide team at [phone] if you need to cancel or reschedule your appointment.  ? ?If you are experiencing a Mental Health or [place] or need someone to talk to, please call the Suicide and Crisis Lifeline: 988 ?call 911  ? ?Patient verbalizes understanding of instructions and care plan provided today and agrees to view in [place]. Active MyChart status confirmed with patient.   ? ?Christa See, MSW, LCSW ?[place] Management ?[facility] ?[email or web address] ?Phone [phone] ?2:32 PM ?  ?

## 2021-06-26 NOTE — Chronic Care Management (AMB) (Signed)
Chronic Care Management    Clinical Social Work Note  06/26/2021 Name: Duane Hill. MRN: [id number] DOB: [birth year]  Duane Hill. is a 81 y.o. year old male who is a primary care patient of Jon Billings, NP. The CCM team was consulted to assist the patient with chronic disease management and/or care coordination needs related to: Transportation Needs .   Engaged with patient by telephone for follow up visit in response to provider referral for social work chronic care management and care coordination services.   Consent to Services:  The patient was given information about Chronic Care Management services, agreed to services, and gave verbal consent prior to initiation of services.  Please see initial visit note for detailed documentation.   Patient agreed to services and consent obtained.   Assessment: Review of patient past medical history, allergies, medications, and health status, including review of relevant consultants reports was performed today as part of a comprehensive evaluation and provision of chronic care management and care coordination services.     SDOH (Social Determinants of Health) assessments and interventions performed:    Advanced Directives Status: Not addressed in this encounter.  CCM Care Plan  Allergies  Allergen Reactions   Protonix [Pantoprazole Sodium] Nausea And Vomiting    Outpatient Encounter Medications as of 06/24/2021  Medication Sig   cetirizine (ZYRTEC) 10 MG tablet Take 1 tablet (10 mg total) by mouth daily.   acetaminophen (TYLENOL) 500 MG tablet Take 1,000 mg by mouth every 6 (six) hours as needed. Patient uss Caplet extra strength   albuterol (VENTOLIN HFA) 108 (90 Base) MCG/ACT inhaler Take 2 puffs every 4-6 hours as necessary for shortness of breath   atorvastatin (LIPITOR) 20 MG tablet Take 1 tablet (20 mg total) by mouth daily.   Azelastine HCl 0.15 % SOLN Place 2 sprays into both nostrils 2 (two) times daily. Place 2  sprays into both nostrils 2 (two) times daily.   carvedilol (COREG) 25 MG tablet Take 1 tablet (25 mg total) by mouth 2 (two) times daily with a meal.   Choline Fenofibrate (FENOFIBRIC ACID) 135 MG CPDR Take 1 capsule by mouth daily.   fluticasone (FLONASE) 50 MCG/ACT nasal spray Place 1 spray into both nostrils 2 (two) times daily.   gabapentin (NEURONTIN) 300 MG capsule Take 2 capsules (600 mg total) by mouth 2 (two) times daily.   glipiZIDE (GLUCOTROL) 5 MG tablet Take 5 mg by mouth 2 (two) times daily before a meal. (Patient not taking: Reported on 01/12/2021)   glucose blood test strip CHECK BLOOD SUGAR TWICE DAILY.   hydrochlorothiazide (HYDRODIURIL) 25 MG tablet Take 1 tablet (25 mg total) by mouth daily.   lisinopril (ZESTRIL) 40 MG tablet Take 1 tablet (40 mg total) by mouth daily.   metFORMIN (GLUCOPHAGE-XR) 500 MG 24 hr tablet Take 1 tablet (500 mg total) by mouth in the morning and at bedtime.   Microlet Lancets MISC USE TWICE DAILY AS DIRECTED.   montelukast (SINGULAIR) 10 MG tablet Take 1 tablet (10 mg total) by mouth at bedtime.   nitrofurantoin, macrocrystal-monohydrate, (MACROBID) 100 MG capsule Take 1 capsule (100 mg total) by mouth 2 (two) times daily.   omeprazole (PRILOSEC) 40 MG capsule Take 1 capsule (40 mg total) by mouth daily.   No facility-administered encounter medications on file as of 06/24/2021.    Patient Active Problem List   Diagnosis Date Noted   B12 deficiency 04/29/2020   HSV (herpes simplex virus) infection 05/10/2019  Allergic rhinitis 02/14/2019   Advanced care planning/counseling discussion 11/29/2016   GERD (gastroesophageal reflux disease) 11/05/2014   Hyperlipidemia associated with type 2 diabetes mellitus ([place]) 11/05/2014   Chronic radiation cystitis 11/05/2014   Hypertension associated with diabetes ([place]) 02/02/2013   Type 2 diabetes mellitus with hyperglycemia ([place]) 02/02/2013   History of prostate cancer 02/02/2013    Conditions to be  addressed/monitored: HTN and DMII; Transportation  Care Plan : General Social Work (Adult)  Updates made by Christa See D, LCSW since 06/26/2021 12:00 AM     Problem: Quality of Life (General Plan of Care)      Goal: Quality of Life Maintained   Start Date: 11/06/2020  This Visit's Progress: On track  Recent Progress: On track  Priority: Medium  Note:   Current barriers:   Acute Mental Health needs related to Stress Limited social support, Transportation, and Family and relationship dysfunction Needs Support, Education, and Care Coordination in order to meet unmet mental health needs. Clinical Goal(s): Over the next 120 days, patient will work with SW, counselor and therapist to reduce or manage symptoms of agitation, mood instability, stress, and bipolar until connected for ongoing counseling. Clinical Interventions:  Assessed patient's previous and current treatment, coping skills, support system and barriers to care  Patient interviewed and appropriate assessments performed Patient reports ongoing leg weakness is about the same Patient is unsure if it's because of scoliosis (diagnosed at 81 yo) or if its because of his age. Patient reports back pain. He denies any strenuous activities or heavy lifting. Patient reports that he doesn't have great balance anymore. CCM LCSW discussed strategies that patient can utilize to prevent risk of falling and promote safety. 09/29: Patient reports recent fall at the beginning of the month. He continues to utilize a cane to assist with balance and prevent fall risk 12/15: Patient reports ongoing leg weakness and ongoing balance concerns. He has been utilizing salve, telynol, and ibuprofen to relieve pain in the back of pt's right hip. No recent falls 3/8: Niece assissted him with new phone/internet set-up. Niece's mother picks up food items from the grocery store. Pt's neighbor agreed to help him if he falls Patient has modified his diet to assist  with management of diabetes. He has equipment at home to check blood sugars when needed CCM LCSW discussed strategies to assist in management of diabetes. Patient is motivated because, I don't want to lose any limbs Patient reports that he doesnt check readings often, only check it when feels like something is wrong. Patient agreed to increase the amount of times he checks his blood sugar to prevent episodes of hypoglycemia 09/29: Reports excitement that his A1C decreased at previous appt with PCP Patient reports limited support. He does not speak with his two daughters and his son has not contacted him after re-locating out of state. Patient's oldest sister resides next door and he speaks with neighbors occasionally 12/15: Patient denies current stressors, depression, anxiety symptoms Patient receives emotional support from his cats (5-6) and his lab dog, Cooper CCM LCSW discussed benefits of implementing self-care in his daily routine. Strategies were identified Patient utilizes a calendar and MyChart to keep up with appointments Patient will utilize [facility] to appointments 12/15: CCM LCSW will complete care guide referral to inform pt of transportation options to assist with traveling to grocery store 3/8: LCSW will complete care guide referral to assist with transportation to medical appts ONLY Solution-Focused Strategies, Mindfulness or Relaxation Training, Active listening / Reflection  utilized , Emotional Supportive Provided, Psychoeducation for mental health needs , Brief CBT , and Verbalization of feelings encouraged  ; Discussed plans with patient for ongoing care management follow up and provided patient with direct contact information for care management team Collaboration with PCP regarding development and update of comprehensive plan of care as evidenced by provider attestation and co-signature Inter-disciplinary care team collaboration (see longitudinal plan of care) Patient  Goals/Self-Care Activities: Over the next 120 days Utilize healthy coping skills and self-care strategies discussed Continue compliance with medications Attend all scheduled appointments with providers Contact clinic with any questions or concerns         Christa See, MSW, [email or web address] Phone [phone] 2:31 PM

## 2021-06-29 ENCOUNTER — Telehealth: Payer: Self-pay

## 2021-06-29 ENCOUNTER — Telehealth: Payer: Self-pay | Admitting: Nurse Practitioner

## 2021-06-29 ENCOUNTER — Ambulatory Visit: Payer: Medicare Other

## 2021-06-29 DIAGNOSIS — I152 Hypertension secondary to endocrine disorders: Secondary | ICD-10-CM

## 2021-06-29 DIAGNOSIS — E1159 Type 2 diabetes mellitus with other circulatory complications: Secondary | ICD-10-CM

## 2021-06-29 DIAGNOSIS — E785 Hyperlipidemia, unspecified: Secondary | ICD-10-CM

## 2021-06-29 DIAGNOSIS — E1169 Type 2 diabetes mellitus with other specified complication: Secondary | ICD-10-CM

## 2021-06-29 DIAGNOSIS — E1165 Type 2 diabetes mellitus with hyperglycemia: Secondary | ICD-10-CM

## 2021-06-29 NOTE — Progress Notes (Unsigned)
Chronic Care Management Pharmacy Note  06/29/2021 Name:  Duane Hill. MRN:  [id number] DOB:  June 04, 1940  Summary: Consider CKD staging due to last three GFRs 40s  Using ibuprofen 2 tabs BID - really encouraged reduction especially considering daily use and GFR in 40s. Will start cutting down Could consider intensification to atorvastatin 40 mg due to ASCVD 10-yr risk >60% Inc TG. Alcohol use - daily 1/2-3/4 wine bottle/day. Will try to cut down  Not sure if coreg necessary (No CAD CHF or tachy), amlodipine might be reasonable in absence of compelling reason for coreg Likely to qualify for ozempic PAP if GLP needed in future ***daily call service?  Subjective: Duane Hill. is an 81 y.o. year old male who is a primary patient of Jon Billings, NP.  The CCM team was consulted for assistance with disease management and care coordination needs.    Engaged with patient by telephone for follow up visit in response to provider referral for pharmacy case management and/or care coordination services.   Consent to Services:  The patient was given information about Chronic Care Management services, agreed to services, and gave verbal consent prior to initiation of services.  Please see initial visit note for detailed documentation.   Patient Care Team: Jon Billings, NP as PCP - Lurline Del, MD as Consulting Physician (Urology) Vanita Ingles, RN as Case Manager ([place]) Rebekah [place], LCSW as Social Worker (Licensed Clinical Social Worker) Madelin Rear, [facility]: Lambert Rhonda W (Pharmacist)  Hospital visits: None in previous 6 months  Objective:  Lab Results  Component Value Date   CREATININE 1.55 (H) 01/05/2021   CREATININE 1.61 (H) 10/03/2020   CREATININE 1.40 (H) 04/28/2020    Lab Results  Component Value Date   HGBA1C 6.9 (H) 01/05/2021   HGBA1C 7.5 (H) 10/03/2020   HGBA1C 7.2 (H) 04/28/2020   Last diabetic Eye exam:  Lab Results  Component Value  Date/Time   HMDIABEYEEXA No Retinopathy 10/16/2019 12:00 AM   HMDIABEYEEXA No Retinopathy 10/16/2019 12:00 AM    Last diabetic Foot exam: No results found for: HMDIABFOOTEX      Component Value Date/Time   CHOL 156 01/05/2021 1533   CHOL 138 10/03/2020 1616   CHOL 141 04/28/2020 1428   CHOL 141 05/25/2016 1032   CHOL 153 05/20/2015 1056   TRIG 303 (H) 01/05/2021 1533   TRIG 402 (H) 10/03/2020 1616   TRIG 377 (H) 04/28/2020 1428   TRIG 280 (H) 05/25/2016 1032   TRIG 365 (H) 05/20/2015 1056   HDL 23 (L) 01/05/2021 1533   HDL 24 (L) 10/03/2020 1616   HDL 22 (L) 04/28/2020 1428   CHOLHDL 6.8 (H) 01/05/2021 1533   VLDL 56 (H) 05/25/2016 1032   LDLCALC 83 01/05/2021 1533   LDLCALC 53 10/03/2020 1616   LDLCALC 60 04/28/2020 1428    Hepatic Function Latest Ref Rng & Units 01/05/2021 10/03/2020 06/29/2019  Total Protein 6.0 - 8.5 g/dL 6.5 6.5 6.3  Albumin 3.7 - 4.7 g/dL 4.6 4.7 4.3  AST 0 - 40 IU/L 31 21 21   ALT 0 - 44 IU/L 30 24 19   Alk Phosphatase 44 - 121 IU/L 61 41(L) 48  Total Bilirubin 0.0 - 1.2 mg/dL 0.3 0.3 0.3    Lab Results  Component Value Date/Time   TSH 4.220 04/28/2020 02:28 PM   TSH 3.120 02/20/2018 01:17 PM    CBC Latest Ref Rng & Units 02/20/2018 06/09/2017 11/29/2016  WBC 3.4 - 10.8 x10E3/uL 7.6  7.4 6.3  Hemoglobin 13.0 - 17.7 g/dL 11.7(L) 12.2(L) 11.8(L)  Hematocrit 37.5 - 51.0 % 36.2(L) 37.9 36.3(L)  Platelets 150 - 450 x10E3/uL 256 266 224   No results found for: VD25OH  Clinical ASCVD:  The 10-year ASCVD risk score (Arnett DK, et al., 2019) is: 67.8%   Values used to calculate the score:     Age: 81 years     Sex: Male     Is Non-Hispanic African American: No     Diabetic: Yes     Tobacco smoker: Yes     Systolic Blood Pressure: 852 mmHg     Is BP treated: Yes     HDL Cholesterol: 23 mg/dL     Total Cholesterol: 156 mg/dL      Social History   Tobacco Use  Smoking Status Former   Types: Cigarettes   Quit date: 11/28/1986   Years since  quitting: 34.6  Smokeless Tobacco Never   BP Readings from Last 3 Encounters:  01/05/21 (!) 142/78  10/03/20 (!) 148/70  04/28/20 128/76   Pulse Readings from Last 3 Encounters:  01/05/21 (!) 55  10/03/20 (!) 52  04/28/20 (!) 54   Wt Readings from Last 3 Encounters:  01/05/21 170 lb 6.4 oz (77.3 kg)  10/03/20 167 lb 6 oz (75.9 kg)  04/28/20 172 lb (78 kg)   BMI Readings from Last 3 Encounters:  01/05/21 26.77 kg/m  10/03/20 26.33 kg/m  04/28/20 27.06 kg/m   Assessment: Review of patient past medical history, allergies, medications, health status, including review of consultants reports, laboratory and other test data, was performed as part of comprehensive evaluation and provision of chronic care management services.   SDOH:  (Social Determinants of Health) assessments and interventions performed: Yes  CCM Care Plan  Allergies  Allergen Reactions   Protonix [Pantoprazole Sodium] Nausea And Vomiting    Medications Reviewed Today     Reviewed by Vanita Ingles, RN (Case Manager) on 05/05/21 at 1441  Med List Status: <None>   Medication Order Taking? Sig Documenting Provider Last Dose Status Informant  acetaminophen (TYLENOL) 500 MG tablet [id number] No Take 1,000 mg by mouth every 6 (six) hours as needed. Patient uss Caplet extra strength [provider] Taking Active   albuterol (VENTOLIN HFA) 108 (90 Base) MCG/ACT inhaler [id number] No Take 2 puffs every 4-6 hours as necessary for shortness of breath Jon Billings, NP Taking Active   atorvastatin (LIPITOR) 20 MG tablet [id number]  Take 1 tablet (20 mg total) by mouth daily. Jon Billings, NP  Active   Azelastine HCl 0.15 % SOLN [id number]  Place 2 sprays into both nostrils 2 (two) times daily. Place 2 sprays into both nostrils 2 (two) times daily. Jon Billings, NP  Active   carvedilol (COREG) 25 MG tablet [id number]  Take 1 tablet (25 mg total) by mouth 2 (two) times daily with a meal. Jon Billings,  NP  Active   cetirizine (ZYRTEC) 10 MG tablet [id number] No Take 1 tablet (10 mg total) by mouth daily. Marnee Guarneri T, NP Taking Active   Choline Fenofibrate (FENOFIBRIC ACID) 135 MG CPDR [id number]  Take 1 capsule by mouth daily. Jon Billings, NP  Active   fluticasone [facility]) 50 MCG/ACT nasal spray [id number]  Place 1 spray into both nostrils 2 (two) times daily. Jon Billings, NP  Active   gabapentin (NEURONTIN) 300 MG capsule [id number]  Take 2 capsules (600 mg total) by mouth 2 (two) times daily. Jon Billings, NP  Active  glipiZIDE (GLUCOTROL) 5 MG tablet [id number] No Take 5 mg by mouth 2 (two) times daily before a meal.  Patient not taking: Reported on 01/12/2021   [provider] Not Taking Active Self  glucose blood test strip [id number] No CHECK BLOOD SUGAR TWICE DAILY. Marnee Guarneri T, NP Taking Active   hydrochlorothiazide (HYDRODIURIL) 25 MG tablet [id number] No Take 1 tablet (25 mg total) by mouth daily. Johnson, Megan P, DO Taking Active   metFORMIN (GLUCOPHAGE-XR) 500 MG 24 hr tablet [id number]  Take 1 tablet (500 mg total) by mouth in the morning and at bedtime. Jon Billings, NP  Active   Microlet Lancets MISC [id number] No USE TWICE DAILY AS DIRECTED. Marnee Guarneri T, NP Taking Active   montelukast (SINGULAIR) 10 MG tablet [id number]  Take 1 tablet (10 mg total) by mouth at bedtime. Jon Billings, NP  Active   nitrofurantoin, macrocrystal-monohydrate, (MACROBID) 100 MG capsule [id number]  Take 1 capsule (100 mg total) by mouth 2 (two) times daily. Jon Billings, NP  Active   omeprazole (PRILOSEC) 40 MG capsule [id number]  Take 1 capsule (40 mg total) by mouth daily. Jon Billings, NP  Active   quinapril (ACCUPRIL) 40 MG tablet [id number]  Take 1 tablet (40 mg total) by mouth daily. Jon Billings, NP  Active            Patient Active Problem List   Diagnosis Date Noted   B12 deficiency 04/29/2020   HSV (herpes simplex virus) infection  05/10/2019   Allergic rhinitis 02/14/2019   Advanced care planning/counseling discussion 11/29/2016   GERD (gastroesophageal reflux disease) 11/05/2014   Hyperlipidemia associated with type 2 diabetes mellitus ([place]) 11/05/2014   Chronic radiation cystitis 11/05/2014   Hypertension associated with diabetes ([place]) 02/02/2013   Type 2 diabetes mellitus with hyperglycemia ([place]) 02/02/2013   History of prostate cancer 02/02/2013    Immunization History  Administered Date(s) Administered   Fluad Quad(high Dose 65+) 02/14/2019, 04/28/2020, 01/05/2021   Influenza, High Dose Seasonal PF 02/24/2016, 01/25/2017, 04/13/2018   Influenza,inj,Quad PF,6+ Mos 02/05/2015   Pneumococcal Conjugate-13 10/18/2013   Pneumococcal Polysaccharide-23 02/24/2016   Pneumococcal-Unspecified 01/06/2009   Tdap 07/20/2011   Zoster, Live 01/07/2009   Conditions to be addressed/monitored: T2DM, HLD, HTN, B12 deficiency noted 04/28/2020  There are no care plans that you recently modified to display for this patient.  Current Barriers:  Unable to maintain control of T2DM  Pharmacist Clinical Goal(s):  Patient will verbalize ability to afford treatment regimen achieve adherence to monitoring guidelines and medication adherence to achieve therapeutic efficacy maintain control of T2DM as evidenced by BG and a1c<7%  through collaboration with PharmD and provider.   Interventions: 1:1 collaboration with Jon Billings, NP regarding development and update of comprehensive plan of care as evidenced by provider attestation and co-signature Inter-disciplinary care team collaboration (see longitudinal plan of care) Comprehensive medication review performed; medication list updated in electronic medical record  Hypertension (BP goal <140/90) -Not ideally controlled -High risk ASCVD -Fall Hx, cautious about showers. Some small animals at home.   -GFRs in 40s  -Current treatment: Carvedilol 25 mg twice daily (not  taking?) HCTZ 25 mg once daily  lisinopril 40 mg once daily  -Medications previously tried: quinapril 40 mg once daily (switch to lisinopril - insurance/pharmacy) -Current home readings: machine broke, not checking  -Denies hypotensive/hypertensive symptoms -Educated on BP goals and benefits of medications for prevention of heart attack, stroke and kidney damage; -Counseled to monitor BP at home 1-2x/wk, document, and provide log  at future appointments -Counseled on diet and exercise extensively Recommended to continue current medication -Using ibuprofen 2 tabs twice daily - stop and use tylenol prn  -drinks white wine 3-4 glasses throughout the day - will cut down, work on less than half glass per day   Hyperlipidemia: (LDL goal < 100) -Ucontrolled -High risk ASCVD, fibrate appropriate per ACCORD -Current treatment: Atorvastatin 20 mg once daily  Choline fenofibrate 135 mg once daily  -Medications previously tried: Simvastatin 40 mg once daily.   -Educated on Cholesterol goals;  Benefits of statin for ASCVD risk reduction; Could consider intensification to atorvastatin 40 mg due to ASCVD 10-yr risk >60% -Counseled on diet and exercise extensively Recommended to continue current medication  Diabetes (A1c goal <7%) -Not ideally controlled, now improved to 6.9% on 12/2020 - GLP-1 (ozempic) to be considered if above goal at f/u -GFR 40s -B12 of 77 04/2020 -Current medications: Metformin XR 500 mg BID -Medications previously tried: Higher dose metformin (GI complaints), SGLT2 (infxn)  -Current home glucose readings fasting glucose: 130 post prandial glucose: not testing. -Denies hypoglycemic/hyperglycemic symptoms -Current meal patterns: now getting meals on wheels. previous: country ham, likes salty food. Not a lot sweets. Cut down on bread considerably. Really likes greens but accessibility issues. -Current exercise: limited, some pain while walking.  -Educated on A1c and blood  sugar goals; Benefits of routine self-monitoring of blood sugar; -Counseled to check feet daily and get yearly eye exams -Recommended to continue current medication Assessed patient finances. ozempic PAP - based on income reported would qualify for assistance if needed. Open to giving himself once weekly injection if needed.  -Could review for CKD staging due to-GFRs in 40s 01/05/2021, 10/03/2020, 04/28/2020 -PCP visit 07/01/21  Patient Goals/Self-Care Activities Patient will:   - take medications as prescribed check glucose daily, document, and provide at future appointments  Medication Assistance: Application for [employer]  medication assistance program. in process.  Anticipated assistance start date TBD based on patient need and f/u a1c%.  See plan of care for additional detail. Patient's preferred pharmacy is:  [place], [place] [address] [place] [address] Phone: [phone] Fax: [phone]   Pt endorses 100% compliance  Follow Up:  Patient agrees to Care Plan and Follow-up. Plan: Pharmacist 4-6 month f/u telephone visit  Future Appointments  Date Time Provider [place]  06/30/2021  9:45 AM CFP [place] [facility]FP-C[facility]  07/01/2021  2:00 PM Jon Billings, NP CFP-CFP [place]  07/08/2021  2:30 PM CFP CCM CASE MANAGER CFP-CFP [facility]  08/19/2021 10:45 AM CFP CCM SOCIAL WORK CFP-CFP [facility]   Madelin Rear, PharmD, [place] Pharmacist  [phone]

## 2021-06-29 NOTE — Telephone Encounter (Signed)
? ?  Telephone encounter was:  Unsuccessful.  06/29/2021 ?Name: Duane Hill. MRN: [id number] DOB: 25-Oct-1940 ? ?Unsuccessful outbound call made today to assist with:  Transportation Needs  ? ?Outreach Attempt:  2nd Attempt ? ?A HIPAA compliant voice message was left requesting a return call.  Instructed patient to call back at [phone]. Received email confirming transportation for 06/30/21 appointment from [facility] [employer]. Left message on voicemail for patient to return my call regarding pick up time of 8:45am for his appointment on 06/30/21.  ? ?  ? ?Duane Hill, [place], CHC ?Care Guide  Embedded Care Coordination ?[place]  Care Management  ?[address] ?[place], [place] [address] ???[email or web address]  ?? [phone]   ?[email or web address] ?  ?

## 2021-06-29 NOTE — Telephone Encounter (Signed)
I will contact transportation to see if they have availability for 07/01/21.  Unfortunately the 06/30/21 visit was not marked as a phone call in [place].  Please see below. ? ?

## 2021-06-29 NOTE — Telephone Encounter (Addendum)
Pt states [facility] called him and told him they were going to pick him up tomorrow morning at 8:45 am.  Pt states his appointment is a telephone visit. ?[facility] said they could not do anything about that, and would be there in the morning. ?Pt needs Transportation to pick him up Wednesday for his appointment with Santiago Glad at 2 pm..  ?Pt is frustrated that this can't be worked out and pt wants to make sure he can get to his appointment Wed. ?

## 2021-06-29 NOTE — Telephone Encounter (Signed)
? ?  Telephone encounter was:  Unsuccessful.  06/29/2021 ?Name: Duane Hill. MRN: [id number] DOB: 02-18-1941 ? ?Unsuccessful outbound call made today to assist with:  Transportation Needs  ? ?Outreach Attempt:  3rd Attempt.  Referral closed unable to contact patient. ? ?A HIPAA compliant voice message was left requesting a return call.  Instructed patient to call back at [phone]. Left message on voicemail to let patient know his pickup time for 07/01/21 is 12:59pm. ? ? ? ?Kirsi Hugh, Rafael Hernandez, CHC ?Care Guide  Embedded Care Coordination ?[place]  Care Management  ?[address] ?[place], [place] [address] ???[email or web address]  ?? [phone]   ?[email or web address] ?  ?

## 2021-06-29 NOTE — Telephone Encounter (Signed)
? ?  Telephone encounter was:  Successful.  ?06/29/2021 ?Name: Boykin Baetz. MRN: [id number] DOB: 1941-04-19 ? ?Mardy Lucier. is a 81 y.o. year old male who is a primary care patient of Jon Billings, NP . The community resource team was consulted for assistance with Transportation Needs  ? ?Care guide performed the following interventions: Spoke with patient let him know [facility] will pick him up at 8:45 am tomorrow 06/30/21.  ? ?Follow Up Plan:  No further follow up planned at this time. The patient has been provided with needed resources. ? ?Shonya Sumida, [place], CHC ?Care Guide  Embedded Care Coordination ?[place]  Care Management  ?[address] ?[place], [place] [address] ???[email or web address]  ?? [phone]   ?[email or web address] ?  ?

## 2021-06-30 ENCOUNTER — Ambulatory Visit: Payer: Self-pay | Admitting: Licensed Clinical Social Worker

## 2021-06-30 ENCOUNTER — Ambulatory Visit (INDEPENDENT_AMBULATORY_CARE_PROVIDER_SITE_OTHER): Payer: Medicare Other | Admitting: *Deleted

## 2021-06-30 DIAGNOSIS — Z0000 Encounter for general adult medical examination without abnormal findings: Secondary | ICD-10-CM | POA: Diagnosis not present

## 2021-06-30 DIAGNOSIS — E1165 Type 2 diabetes mellitus with hyperglycemia: Secondary | ICD-10-CM

## 2021-06-30 DIAGNOSIS — E1159 Type 2 diabetes mellitus with other circulatory complications: Secondary | ICD-10-CM

## 2021-06-30 DIAGNOSIS — I152 Hypertension secondary to endocrine disorders: Secondary | ICD-10-CM

## 2021-06-30 NOTE — Progress Notes (Signed)
? ?Subjective:  ? Duane Hill. is a 81 y.o. male who presents for Medicare Annual/Subsequent preventive examination. ? ?I connected with  Duane Hill. on 06/30/21 by a telephone enabled telemedicine application and verified that I am speaking with the correct person using two identifiers. ?  ?I discussed the limitations of evaluation and management by telemedicine. The patient expressed understanding and agreed to proceed. ? ?Patient location: home ? ?Provider location: Tele-health  not in office ? ? ? ?Review of Systems    ? ? ?Cardiac Risk Factors include: advanced age (>53mn, >>5women);diabetes mellitus;male gender;sedentary lifestyle;hypertension ? ?   ?Objective:  ?  ?Today's Vitals  ? 06/30/21 0946  ?PainSc: 3   ? ?There is no height or weight on file to calculate BMI. ? ?Advanced Directives 06/30/2021 12/06/2018 12/01/2017 11/24/2016  ?Does Patient Have a Medical Advance Directive? No Yes Yes No  ?Type of Advance Directive - Living will;Healthcare Power of [place]-  ?Copy of [place] Chart? - No - copy requested No - copy requested -  ?Would patient like information on creating a medical advance directive? No - Patient declined - - Yes (MAU/Ambulatory/Procedural Areas - Information given)  ? ? ?Current Medications (verified) ?Outpatient Encounter Medications as of 06/30/2021  ?Medication Sig  ? acetaminophen (TYLENOL) 500 MG tablet Take 1,000 mg by mouth every 6 (six) hours as needed. Patient uss Caplet extra strength  ? albuterol (VENTOLIN HFA) 108 (90 Base) MCG/ACT inhaler Take 2 puffs every 4-6 hours as necessary for shortness of breath  ? atorvastatin (LIPITOR) 20 MG tablet Take 1 tablet (20 mg total) by mouth daily.  ? Azelastine HCl 0.15 % SOLN Place 2 sprays into both nostrils 2 (two) times daily. Place 2 sprays into both nostrils 2 (two) times daily.  ? carvedilol (COREG) 25 MG tablet Take 1 tablet (25 mg total) by mouth 2 (two) times  daily with a meal.  ? cetirizine (ZYRTEC) 10 MG tablet Take 1 tablet (10 mg total) by mouth daily.  ? Choline Fenofibrate (FENOFIBRIC ACID) 135 MG CPDR Take 1 capsule by mouth daily.  ? fluticasone (FLONASE) 50 MCG/ACT nasal spray Place 1 spray into both nostrils 2 (two) times daily.  ? gabapentin (NEURONTIN) 300 MG capsule Take 2 capsules (600 mg total) by mouth 2 (two) times daily.  ? glipiZIDE (GLUCOTROL) 5 MG tablet Take 5 mg by mouth 2 (two) times daily before a meal.  ? glucose blood test strip CHECK BLOOD SUGAR TWICE DAILY.  ? hydrochlorothiazide (HYDRODIURIL) 25 MG tablet Take 1 tablet (25 mg total) by mouth daily.  ? lisinopril (ZESTRIL) 40 MG tablet Take 1 tablet (40 mg total) by mouth daily.  ? metFORMIN (GLUCOPHAGE-XR) 500 MG 24 hr tablet Take 1 tablet (500 mg total) by mouth in the morning and at bedtime.  ? Microlet Lancets MISC USE TWICE DAILY AS DIRECTED.  ? montelukast (SINGULAIR) 10 MG tablet Take 1 tablet (10 mg total) by mouth at bedtime.  ? nitrofurantoin, macrocrystal-monohydrate, (MACROBID) 100 MG capsule Take 1 capsule (100 mg total) by mouth 2 (two) times daily.  ? omeprazole (PRILOSEC) 40 MG capsule Take 1 capsule (40 mg total) by mouth daily.  ? ?No facility-administered encounter medications on file as of 06/30/2021.  ? ? ?Allergies (verified) ?Protonix [pantoprazole sodium]  ? ?History: ?Past Medical History:  ?Diagnosis Date  ? Arthritis   ? Asthma   ? Inhaler as needed  ? Dupuytren contracture   ?  GERD (gastroesophageal reflux disease)   ? Hyperlipidemia   ? Hypertension   ? Radiation proctitis   ? Renal insufficiency   ? ?Past Surgical History:  ?Procedure Laterality Date  ? PROSTATE SURGERY    ? ?Family History  ?Problem Relation Age of Onset  ? Heart attack Mother   ? Hypertension Father   ? Stroke Father   ? Cancer Father   ? Diabetes Father   ? Heart attack Father   ? ?Social History  ? ?Socioeconomic History  ? Marital status: Divorced  ?  Spouse name: Not on file  ? Number of  children: Not on file  ? Years of education: Not on file  ? Highest education level: High school graduate  ?Occupational History  ? Not on file  ?Tobacco Use  ? Smoking status: Former  ?  Types: Cigarettes  ?  Quit date: 11/28/1986  ?  Years since quitting: 34.6  ? Smokeless tobacco: Never  ?Vaping Use  ? Vaping Use: Never used  ?Substance and Sexual Activity  ? Alcohol use: Yes  ?  Alcohol/week: 7.0 standard drinks  ?  Types: 7 Glasses of wine per week  ?  Comment: 1 glass of wine at night  ? Drug use: No  ? Sexual activity: Never  ?Other Topics Concern  ? Not on file  ?Social History Narrative  ? Retired from city   ? ?Social Determinants of Health  ? ?Financial Resource Strain: Low Risk   ? Difficulty of Paying Living Expenses: Not very hard  ?Food Insecurity: No Food Insecurity  ? Worried About Charity fundraiser in the Last Year: Never true  ? Ran Out of Food in the Last Year: Never true  ?Transportation Needs: Unmet Transportation Needs  ? Lack of Transportation (Medical): Yes  ? Lack of Transportation (Non-Medical): Yes  ?Physical Activity: Inactive  ? Days of Exercise per Week: 0 days  ? Minutes of Exercise per Session: 0 min  ?Stress: No Stress Concern Present  ? Feeling of Stress : Only a little  ?Social Connections: Socially Isolated  ? Frequency of Communication with Friends and Family: Never  ? Frequency of Social Gatherings with Friends and Family: Never  ? Attends Religious Services: Never  ? Active Member of Clubs or Organizations: No  ? Attends Archivist Meetings: Never  ? Marital Status: Divorced  ? ? ?Tobacco Counseling ?Counseling given: Not Answered ? ? ?Clinical Intake: ? ?Pre-visit preparation completed: Yes ? ?Pain : 0-10 ?Pain Score: 3  ?Pain Location: Leg ?Pain Orientation: Left, Right ?Pain Descriptors / Indicators: Aching, Dull ?Pain Onset: More than a month ago ?Pain Frequency: Intermittent ?Effect of Pain on Daily Activities: yes ? ?  ? ?Nutritional Risks: None ?Diabetes:  Yes ?CBG done?: No ?Did pt. bring in CBG monitor from home?: No ? ?How often do you need to have someone help you when you read instructions, pamphlets, or other written materials from your doctor or pharmacy?: 1 - Never ? ?Diabetic?  Yes ? ?Nutrition Risk Assessment: ? ?Has the patient had any N/V/D within the last 2 months?  No  ?Does the patient have any non-healing wounds?  No  ?Has the patient had any unintentional weight loss or weight gain?  No  ? ?Diabetes: ? ?Is the patient diabetic?  Yes  ?If diabetic, was a CBG obtained today?  No  ?Did the patient bring in their glucometer from home?  No  ?How often do you monitor your CBG's? 1  x a week.  ? ?Financial Strains and Diabetes Management: ? ?Are you having any financial strains with the device, your supplies or your medication? Yes .  ?Does the patient want to be seen by Chronic Care Management for management of their diabetes?  No  ?Would the patient like to be referred to a Nutritionist or for Diabetic Management?  No  ? ?Diabetic Exams: ? ?Diabetic Eye Exam:  Pt has been advised about the importance in completing this exam ? ?Diabetic Foot Exam:Pt has been advised about the importance in completing this exam.  ? ?Interpreter Needed?: No ?Activities of Daily Living ?In your present state of health, do you have any difficulty performing the following activities: 06/30/2021  ?Hearing? Y  ?Comment ringing in ears  ?Vision? Y  ?Difficulty concentrating or making decisions? N  ?Walking or climbing stairs? Y  ?Dressing or bathing? Y  ?Doing errands, shopping? Y  ?Preparing Food and eating ? N  ?Using the Toilet? Y  ?In the past six months, have you accidently leaked urine? Y  ?Comment patient is having leakage can't hold it  ?Do you have problems with loss of bowel control? N  ?Managing your Medications? N  ?Managing your Finances? N  ?Housekeeping or managing your Housekeeping? Y  ?Some recent data might be hidden  ? ? ?Patient Care Team: ?Jon Billings, NP  as PCP - General ?Hollice Espy, MD as Consulting Physician (Urology) ?Vanita Ingles, RN as Case Manager (General Practice) ?Rebekah Chesterfield, LCSW as Education officer, museum (Licensed Holiday representative) ?[place],

## 2021-06-30 NOTE — Patient Instructions (Signed)
Duane Hill , ?Thank you for taking time to come for your Medicare Wellness Visit. I appreciate your ongoing commitment to your health goals. Please review the following plan we discussed and let me know if I can assist you in the future.  ? ?Screening recommendations/referrals: ?Colonoscopy: no longer required ?Recommended yearly ophthalmology/optometry visit for glaucoma screening and checkup ?Recommended yearly dental visit for hygiene and checkup ? ?Vaccinations: ?Influenza vaccine: up to date ?Pneumococcal vaccine: up to date ?Tdap vaccine: up to date ?Shingles vaccine: Education provided   ? ?Advanced directives: Education provided ? ?Conditions/risks identified:  ? ?Next appointment: 07-01-2021  2:00  [facility] ? ?Preventive Care 79 Years and Older, Male ?Preventive care refers to lifestyle choices and visits with your health care provider that can promote health and wellness. ?What does preventive care include? ?A yearly physical exam. This is also called an annual well check. ?Dental exams once or twice a year. ?Routine eye exams. Ask your health care provider how often you should have your eyes checked. ?Personal lifestyle choices, including: ?Daily care of your teeth and gums. ?Regular physical activity. ?Eating a healthy diet. ?Avoiding tobacco and drug use. ?Limiting alcohol use. ?Practicing safe sex. ?Taking low doses of aspirin every day. ?Taking vitamin and mineral supplements as recommended by your health care provider. ?What happens during an annual well check? ?The services and screenings done by your health care provider during your annual well check will depend on your age, overall health, lifestyle risk factors, and family history of disease. ?Counseling  ?Your health care provider may ask you questions about your: ?Alcohol use. ?Tobacco use. ?Drug use. ?Emotional well-being. ?Home and relationship well-being. ?Sexual activity. ?Eating habits. ?History of falls. ?Memory and ability to  understand (cognition). ?Work and work Statistician. ?Screening  ?You may have the following tests or measurements: ?Height, weight, and BMI. ?Blood pressure. ?Lipid and cholesterol levels. These may be checked every 5 years, or more frequently if you are over 79 years old. ?Skin check. ?Lung cancer screening. You may have this screening every year starting at age 80 if you have a 30-pack-year history of smoking and currently smoke or have quit within the past 15 years. ?Fecal occult blood test (FOBT) of the stool. You may have this test every year starting at age 72. ?Flexible sigmoidoscopy or colonoscopy. You may have a sigmoidoscopy every 5 years or a colonoscopy every 10 years starting at age 81. ?Prostate cancer screening. Recommendations will vary depending on your family history and other risks. ?Hepatitis C blood test. ?Hepatitis B blood test. ?Sexually transmitted disease (STD) testing. ?Diabetes screening. This is done by checking your blood sugar (glucose) after you have not eaten for a while (fasting). You may have this done every 1-3 years. ?Abdominal aortic aneurysm (AAA) screening. You may need this if you are a current or former smoker. ?Osteoporosis. You may be screened starting at age 70 if you are at high risk. ?Talk with your health care provider about your test results, treatment options, and if necessary, the need for more tests. ?Vaccines  ?Your health care provider may recommend certain vaccines, such as: ?Influenza vaccine. This is recommended every year. ?Tetanus, diphtheria, and acellular pertussis (Tdap, Td) vaccine. You may need a Td booster every 10 years. ?Zoster vaccine. You may need this after age 36. ?Pneumococcal 13-valent conjugate (PCV13) vaccine. One dose is recommended after age 60. ?Pneumococcal polysaccharide (PPSV23) vaccine. One dose is recommended after age 49. ?Talk to your health care provider about which screenings and  vaccines you need and how often you need them. ?This  information is not intended to replace advice given to you by your health care provider. Make sure you discuss any questions you have with your health care provider. ?Document Released: 05/02/2015 Document Revised: 12/24/2015 Document Reviewed: 02/04/2015 ?Elsevier Interactive Patient Education ? 2017 [place]. ? ?Fall Prevention in the Home ?Falls can cause injuries. They can happen to people of all ages. There are many things you can do to make your home safe and to help prevent falls. ?What can I do on the outside of my home? ?Regularly fix the edges of walkways and driveways and fix any cracks. ?Remove anything that might make you trip as you walk through a door, such as a raised step or threshold. ?Trim any bushes or trees on the path to your home. ?Use bright outdoor lighting. ?Clear any walking paths of anything that might make someone trip, such as rocks or tools. ?Regularly check to see if handrails are loose or broken. Make sure that both sides of any steps have handrails. ?Any raised decks and porches should have guardrails on the edges. ?Have any leaves, snow, or ice cleared regularly. ?Use sand or salt on walking paths during winter. ?Clean up any spills in your garage right away. This includes oil or grease spills. ?What can I do in the bathroom? ?Use night lights. ?Install grab bars by the toilet and in the tub and shower. Do not use towel bars as grab bars. ?Use non-skid mats or decals in the tub or shower. ?If you need to sit down in the shower, use a plastic, non-slip stool. ?Keep the floor dry. Clean up any water that spills on the floor as soon as it happens. ?Remove soap buildup in the tub or shower regularly. ?Attach bath mats securely with double-sided non-slip rug tape. ?Do not have throw rugs and other things on the floor that can make you trip. ?What can I do in the bedroom? ?Use night lights. ?Make sure that you have a light by your bed that is easy to reach. ?Do not use any sheets or  blankets that are too big for your bed. They should not hang down onto the floor. ?Have a firm chair that has side arms. You can use this for support while you get dressed. ?Do not have throw rugs and other things on the floor that can make you trip. ?What can I do in the kitchen? ?Clean up any spills right away. ?Avoid walking on wet floors. ?Keep items that you use a lot in easy-to-reach places. ?If you need to reach something above you, use a strong step stool that has a grab bar. ?Keep electrical cords out of the way. ?Do not use floor polish or wax that makes floors slippery. If you must use wax, use non-skid floor wax. ?Do not have throw rugs and other things on the floor that can make you trip. ?What can I do with my stairs? ?Do not leave any items on the stairs. ?Make sure that there are handrails on both sides of the stairs and use them. Fix handrails that are broken or loose. Make sure that handrails are as long as the stairways. ?Check any carpeting to make sure that it is firmly attached to the stairs. Fix any carpet that is loose or worn. ?Avoid having throw rugs at the top or bottom of the stairs. If you do have throw rugs, attach them to the floor with carpet tape. ?Make  sure that you have a light switch at the top of the stairs and the bottom of the stairs. If you do not have them, ask someone to add them for you. ?What else can I do to help prevent falls? ?Wear shoes that: ?Do not have high heels. ?Have rubber bottoms. ?Are comfortable and fit you well. ?Are closed at the toe. Do not wear sandals. ?If you use a stepladder: ?Make sure that it is fully opened. Do not climb a closed stepladder. ?Make sure that both sides of the stepladder are locked into place. ?Ask someone to hold it for you, if possible. ?Clearly mark and make sure that you can see: ?Any grab bars or handrails. ?First and last steps. ?Where the edge of each step is. ?Use tools that help you move around (mobility aids) if they are  needed. These include: ?Canes. ?Walkers. ?Scooters. ?Crutches. ?Turn on the lights when you go into a dark area. Replace any light bulbs as soon as they burn out. ?Set up your furniture so you have a clear path.

## 2021-06-30 NOTE — Chronic Care Management (AMB) (Signed)
?Chronic Care Management  ? ? Clinical Social Work Note ? ?06/30/2021 ?Name: Duane Hill. MRN: [id number] DOB: [birth year] ? ?Duane Hill. is a 81 y.o. year old male who is a primary care patient of Jon Billings, NP. The CCM team was consulted to assist the patient with chronic disease management and/or care coordination needs related to: Transportation Needs .  ? ?Engaged with patient by telephone for follow up visit in response to provider referral for social work chronic care management and care coordination services.  ? ?Consent to Services:  ?The patient was given information about Chronic Care Management services, agreed to services, and gave verbal consent prior to initiation of services.  Please see initial visit note for detailed documentation.  ? ?Patient agreed to services and consent obtained.  ? ?Assessment: Review of patient past medical history, allergies, medications, and health status, including review of relevant consultants reports was performed today as part of a comprehensive evaluation and provision of chronic care management and care coordination services.    ? ?SDOH (Social Determinants of Health) assessments and interventions performed:   ? ?Advanced Directives Status: Not addressed in this encounter. ? ?CCM Care Plan ? ?Allergies  ?Allergen Reactions  ? Protonix [Pantoprazole Sodium] Nausea And Vomiting  ? ? ?Outpatient Encounter Medications as of 06/30/2021  ?Medication Sig  ? cetirizine (ZYRTEC) 10 MG tablet Take 1 tablet (10 mg total) by mouth daily.  ? acetaminophen (TYLENOL) 500 MG tablet Take 1,000 mg by mouth every 6 (six) hours as needed. Patient uss Caplet extra strength  ? albuterol (VENTOLIN HFA) 108 (90 Base) MCG/ACT inhaler Take 2 puffs every 4-6 hours as necessary for shortness of breath  ? atorvastatin (LIPITOR) 20 MG tablet Take 1 tablet (20 mg total) by mouth daily.  ? Azelastine HCl 0.15 % SOLN Place 2 sprays into both nostrils 2 (two) times daily. Place 2  sprays into both nostrils 2 (two) times daily.  ? carvedilol (COREG) 25 MG tablet Take 1 tablet (25 mg total) by mouth 2 (two) times daily with a meal.  ? Choline Fenofibrate (FENOFIBRIC ACID) 135 MG CPDR Take 1 capsule by mouth daily.  ? fluticasone (FLONASE) 50 MCG/ACT nasal spray Place 1 spray into both nostrils 2 (two) times daily.  ? gabapentin (NEURONTIN) 300 MG capsule Take 2 capsules (600 mg total) by mouth 2 (two) times daily.  ? glipiZIDE (GLUCOTROL) 5 MG tablet Take 5 mg by mouth 2 (two) times daily before a meal.  ? glucose blood test strip CHECK BLOOD SUGAR TWICE DAILY.  ? hydrochlorothiazide (HYDRODIURIL) 25 MG tablet Take 1 tablet (25 mg total) by mouth daily.  ? lisinopril (ZESTRIL) 40 MG tablet Take 1 tablet (40 mg total) by mouth daily.  ? metFORMIN (GLUCOPHAGE-XR) 500 MG 24 hr tablet Take 1 tablet (500 mg total) by mouth in the morning and at bedtime.  ? Microlet Lancets MISC USE TWICE DAILY AS DIRECTED.  ? montelukast (SINGULAIR) 10 MG tablet Take 1 tablet (10 mg total) by mouth at bedtime.  ? nitrofurantoin, macrocrystal-monohydrate, (MACROBID) 100 MG capsule Take 1 capsule (100 mg total) by mouth 2 (two) times daily.  ? omeprazole (PRILOSEC) 40 MG capsule Take 1 capsule (40 mg total) by mouth daily.  ? ?No facility-administered encounter medications on file as of 06/30/2021.  ? ? ?Patient Active Problem List  ? Diagnosis Date Noted  ? B12 deficiency 04/29/2020  ? HSV (herpes simplex virus) infection 05/10/2019  ? Allergic rhinitis 02/14/2019  ? Advanced  care planning/counseling discussion 11/29/2016  ? GERD (gastroesophageal reflux disease) 11/05/2014  ? Hyperlipidemia associated with type 2 diabetes mellitus ([place]) 11/05/2014  ? Chronic radiation cystitis 11/05/2014  ? Hypertension associated with diabetes ([place]) 02/02/2013  ? Type 2 diabetes mellitus with hyperglycemia ([place]) 02/02/2013  ? History of prostate cancer 02/02/2013  ? ? ?Conditions to be addressed/monitored: HTN and DMII;  Transportation ? ?Care Plan : General Social Work (Adult)  ?Updates made by Rebekah Chesterfield, LCSW since 06/30/2021 12:00 AM  ?  ? ?Problem: Quality of Life (General Plan of Care)   ?  ? ?Goal: Quality of Life Maintained   ?Start Date: 11/06/2020  ?This Visit's Progress: On track  ?Recent Progress: On track  ?Priority: Medium  ?Note:   ?Current barriers:   ?Acute Mental Health needs related to Stress ?Limited social support, Transportation, and Family and relationship dysfunction ?Needs Support, Education, and Care Coordination in order to meet unmet mental health needs. ?Clinical Goal(s): Over the next 120 days, patient will work with SW, counselor and therapist to reduce or manage symptoms of agitation, mood instability, stress, and bipolar until connected for ongoing counseling. ?Clinical Interventions:  ?Assessed patient's previous and current treatment, coping skills, support system and barriers to care  ?Patient interviewed and appropriate assessments performed ?Patient reports ongoing leg weakness is ?about the same? Patient is unsure if it's because of scoliosis (diagnosed at 81 yo) or if its because of his age. Patient reports back pain. He denies any strenuous activities or heavy lifting. Patient reports that he doesn't have great balance anymore. CCM LCSW discussed strategies that patient can utilize to prevent risk of falling and promote safety. 09/29: Patient reports recent fall at the beginning of the month. He continues to utilize a cane to assist with balance and prevent fall risk 12/15: Patient reports ongoing leg weakness and ongoing balance concerns. He has been utilizing salve, telynol, and ibuprofen to relieve pain in the back of pt's right hip. No recent falls 3/8: Niece assissted him with new phone/internet set-up. Niece's mother picks up food items from the grocery store. Pt's neighbor agreed to help him if he falls ?Patient has modified his diet to assist with management of diabetes. He has  equipment at home to check blood sugars when needed ?CCM LCSW discussed strategies to assist in management of diabetes. Patient is motivated because, ?I don't want to lose any limbs? Patient reports that he doesnt check readings often, only check it when feels like something is wrong. Patient agreed to increase the amount of times he checks his blood sugar to prevent episodes of hypoglycemia 09/29: Reports excitement that his A1C decreased at previous appt with PCP ?Patient reports limited support. He does not speak with his two daughters and his son has not contacted him after re-locating out of state. Patient's oldest sister resides next door and he speaks with neighbors occasionally 12/15: Patient denies current stressors, depression, anxiety symptoms ?Patient receives emotional support from his cats (5-6) and his lab dog, Burt Knack ?CCM LCSW discussed benefits of implementing self-care in his daily routine. Strategies were identified ?Patient utilizes a calendar and MyChart to keep up with appointments ?Patient will utilize [facility] to appointments 12/15: CCM LCSW will complete care guide referral to inform pt of transportation options to assist with traveling to grocery store 3/8: LCSW will complete care guide referral to assist with transportation to medical appts ONLY 3/14: Patient was informed of established transportation to his upcoming appt. Patient was appreciative for the  assistance ?Solution-Focused Strategies, Mindfulness or Psychologist, educational, Active listening / Reflection utilized , Emotional Supportive Provided, Psychoeducation for mental health needs , Brief CBT , and Verbalization of feelings encouraged  ; ?Discussed plans with patient for ongoing care management follow up and provided patient with direct contact information for care management team ?Collaboration with PCP regarding development and update of comprehensive plan of care as evidenced by provider attestation and  co-signature ?Inter-disciplinary care team collaboration (see longitudinal plan of care) ?Patient Goals/Self-Care Activities: Over the next 120 days ?Utilize healthy coping skills and self-care strategies discussed ?Continue co

## 2021-06-30 NOTE — Patient Instructions (Signed)
Visit Information ? ?Thank you for taking time to visit with me today. Please don't hesitate to contact me if I can be of assistance to you before our next scheduled telephone appointment. ? ?Following are the goals we discussed today:  ?Patient Goals/Self-Care Activities: Over the next 120 days ?Utilize healthy coping skills and self-care strategies discussed ?Continue compliance with medications ?Attend all scheduled appointments with providers ?Contact clinic with any questions or concerns ? ?Our next appointment is by telephone on 08/19/21  ? ?Please call the care guide team at [phone] if you need to cancel or reschedule your appointment.  ? ?If you are experiencing a Mental Health or [place] or need someone to talk to, please call 911  ? ?Patient verbalizes understanding of instructions and care plan provided today and agrees to view in [facility]. Active MyChart status confirmed with patient.   ? ?Christa See, MSW, LCSW ?[facility] ?[facility] ?[email or web address] ?Phone [phone] ?8:47 AM ?  ?

## 2021-07-01 ENCOUNTER — Other Ambulatory Visit: Payer: Self-pay

## 2021-07-01 ENCOUNTER — Encounter: Payer: Self-pay | Admitting: Nurse Practitioner

## 2021-07-01 ENCOUNTER — Ambulatory Visit (INDEPENDENT_AMBULATORY_CARE_PROVIDER_SITE_OTHER): Payer: Medicare Other | Admitting: Nurse Practitioner

## 2021-07-01 VITALS — BP 181/77 | HR 48 | Temp 99.0°F | Wt 154.8 lb

## 2021-07-01 DIAGNOSIS — I152 Hypertension secondary to endocrine disorders: Secondary | ICD-10-CM

## 2021-07-01 DIAGNOSIS — I10 Essential (primary) hypertension: Secondary | ICD-10-CM | POA: Diagnosis not present

## 2021-07-01 DIAGNOSIS — E785 Hyperlipidemia, unspecified: Secondary | ICD-10-CM

## 2021-07-01 DIAGNOSIS — E1165 Type 2 diabetes mellitus with hyperglycemia: Secondary | ICD-10-CM

## 2021-07-01 DIAGNOSIS — E1169 Type 2 diabetes mellitus with other specified complication: Secondary | ICD-10-CM

## 2021-07-01 DIAGNOSIS — E1159 Type 2 diabetes mellitus with other circulatory complications: Secondary | ICD-10-CM

## 2021-07-01 DIAGNOSIS — R531 Weakness: Secondary | ICD-10-CM

## 2021-07-01 DIAGNOSIS — E538 Deficiency of other specified B group vitamins: Secondary | ICD-10-CM | POA: Diagnosis not present

## 2021-07-01 MED ORDER — CARVEDILOL 25 MG PO TABS: 25.0000 mg | ORAL_TABLET | Freq: Two times a day (BID) | ORAL | 1 refills | Status: DC

## 2021-07-01 NOTE — Progress Notes (Signed)
? ? ?BP (!) 181/77   Pulse (!) 48   Temp 99 ?F (37.2 ?C) (Oral)   Wt 154 lb 12.8 oz (70.2 kg)   SpO2 96%   BMI 24.32 kg/m?   ? ?Subjective:  ? ? Patient ID: Duane Edelman., male    DOB: 1940/05/19, 81 y.o.   MRN: [id number] ? ?HPI: ?Duane Gleed. is a 81 y.o. male ? ?Chief Complaint  ?Patient presents with  ? Diabetes  ? Hyperlipidemia  ? Hypertension  ? ?HYPERTENSION / HYPERLIPIDEMIA ?Satisfied with current treatment? no ?Duration of hypertension: years ?BP monitoring frequency: not checking ?BP range:  ?BP medication side effects: no ?Past BP meds: not sure what he is taking ?Duration of hyperlipidemia: years ?Cholesterol medication side effects: no ?Cholesterol supplements: none ?Past cholesterol medications: atorvastain (lipitor) ?Medication compliance: excellent compliance ?Aspirin: no ?Recent stressors: no ?Recurrent headaches: no ?Visual changes: no ?Palpitations: no ?Dyspnea: no ?Chest pain: no ?Lower extremity edema: no ?Dizzy/lightheaded: no ? ?DIABETES ?Hypoglycemic episodes:no ?Polydipsia/polyuria: no ?Visual disturbance: no ?Chest pain: no ?Paresthesias: no ?Glucose Monitoring: no ? Accucheck frequency: Not Checking ? Fasting glucose: ? Post prandial: ? Evening: ? Before meals: ?Taking Insulin?: no ? Long acting insulin: ? Short acting insulin: ?Blood Pressure Monitoring: not checking ?Retinal Examination: Not Up to Date ?Foot Exam: Up to Date ?Diabetic Education: Not Completed ?Pneumovax: Up to Date ?Influenza: Up to Date ?Aspirin: no ? ?Patient states he feels like his legs are getting worse.  He still has trouble getting around.  He did the physical therapy.  States they would come in and tell him things to do but then he couldn't remember them.  States it didn't help him very much.  His theory on why he isn't walking well is because he scoliosis.  He states he was told that one leg is shorter than the other. He has figured out ways to get around.  ? ? ?Relevant past medical, surgical,  family and social history reviewed and updated as indicated. Interim medical history since our last visit reviewed. ?Allergies and medications reviewed and updated. ? ?Review of Systems  ?Eyes:  Negative for visual disturbance.  ?Respiratory:  Negative for chest tightness and shortness of breath.   ?Cardiovascular:  Negative for chest pain, palpitations and leg swelling.  ?Endocrine: Negative for polydipsia and polyuria.  ?Neurological:  Positive for weakness. Negative for dizziness, light-headedness, numbness and headaches.  ? ?Per HPI unless specifically indicated above ? ?   ?Objective:  ?  ?BP (!) 181/77   Pulse (!) 48   Temp 99 ?F (37.2 ?C) (Oral)   Wt 154 lb 12.8 oz (70.2 kg)   SpO2 96%   BMI 24.32 kg/m?   ?Wt Readings from Last 3 Encounters:  ?07/01/21 154 lb 12.8 oz (70.2 kg)  ?01/05/21 170 lb 6.4 oz (77.3 kg)  ?10/03/20 167 lb 6 oz (75.9 kg)  ?  ?Physical Exam ?Vitals and nursing note reviewed.  ?Constitutional:   ?   General: He is not in acute distress. ?   Appearance: Normal appearance. He is not ill-appearing, toxic-appearing or diaphoretic.  ?HENT:  ?   Head: Normocephalic.  ?   Right Ear: External ear normal.  ?   Left Ear: External ear normal.  ?   Nose: Nose normal. No congestion or rhinorrhea.  ?   Mouth/Throat:  ?   Mouth: Mucous membranes are moist.  ?Eyes:  ?   General:     ?   Right eye: No  discharge.     ?   Left eye: No discharge.  ?   Extraocular Movements: Extraocular movements intact.  ?   Conjunctiva/sclera: Conjunctivae normal.  ?   Pupils: Pupils are equal, round, and reactive to light.  ?Cardiovascular:  ?   Rate and Rhythm: Normal rate and regular rhythm.  ?   Heart sounds: No murmur heard. ?Pulmonary:  ?   Effort: Pulmonary effort is normal. No respiratory distress.  ?   Breath sounds: Normal breath sounds. No wheezing, rhonchi or rales.  ?Abdominal:  ?   General: Abdomen is flat. Bowel sounds are normal.  ?Musculoskeletal:  ?   Cervical back: Normal range of motion and neck  supple.  ?Skin: ?   General: Skin is warm and dry.  ?   Capillary Refill: Capillary refill takes less than 2 seconds.  ?Neurological:  ?   General: No focal deficit present.  ?   Mental Status: He is alert and oriented to person, place, and time.  ?Psychiatric:     ?   Mood and Affect: Mood normal.     ?   Behavior: Behavior normal.     ?   Thought Content: Thought content normal.     ?   Judgment: Judgment normal.  ? ? ?Results for orders placed or performed in visit on 01/07/21  ?HM DIABETES EYE EXAM  ?Result Value Ref Range  ? HM Diabetic Eye Exam No Retinopathy No Retinopathy  ? ?   ?Assessment & Plan:  ? ?Problem List Items Addressed This Visit   ? ?  ? Cardiovascular and Med[place]inum  ? Hypertension associated with diabetes ([place]) - Primary  ?  Chronic. Not well controlled. Has not been taking Carvedilol.  HCTZ has not been filled since 2021 but states he is taking it.  We have called the pharmacy to see if he has been picking up the medication.  It has not been filled for over a year.  Will restart Carvedilol.  Follow up in 1 month for reevaluation.  Discussed bring his medications to the next visit to review them with him. ?  ?  ? Relevant Medications  ? carvedilol (COREG) 25 MG tablet  ? Other Relevant Orders  ? Comp Met (CMET)  ?  ? Endocrine  ? Hyperlipidemia associated with type 2 diabetes mellitus ([place])  ?  Chronic.  Controlled.  Continue with current medication regimen on Atorvastatin $RemoveBeforeD'20mg'[id number]$  daily.  Labs ordered today.  Return to clinic in 3 months for reevaluation.  Call sooner if concerns arise.  ? ?  ?  ? Relevant Medications  ? carvedilol (COREG) 25 MG tablet  ? Other Relevant Orders  ? Lipid Profile  ? Type 2 diabetes mellitus with hyperglycemia ([facility])  ?  Chronic. Patient is due for lab work.  He has not been taking the Metformin.  Does not check sugars at home.  Will check labs at visit today. Will make recommendations based on lab results.   ?  ?  ? Relevant Orders  ? HgB A1c  ?  ? Other  ? B12  deficiency  ? Relevant Orders  ? B12  ? ?Other Visit Diagnoses   ? ? Weakness      ? States he is okay with how he is doing now. Has made adjustments to get around. Does not want to do physical therapy again. Will reasses at next visit.  ? Essential hypertension      ? Relevant Medications  ? carvedilol (  COREG) 25 MG tablet  ? ?  ?  ? ?Follow up plan: ?Return in about 3 months (around 10/01/2021) for HTN, HLD, DM2 FU. ? ? ? ? ? ? ?

## 2021-07-01 NOTE — Assessment & Plan Note (Addendum)
Chronic. Not well controlled. Has not been taking Carvedilol.  HCTZ has not been filled since 2021 but states he is taking it.  We have called the pharmacy to see if he has been picking up the medication.  It has not been filled for over a year.  Will restart Carvedilol.  Follow up in 1 month for reevaluation.  Discussed bring his medications to the next visit to review them with him. ?

## 2021-07-01 NOTE — Assessment & Plan Note (Signed)
Chronic.  Controlled.  Continue with current medication regimen on Atorvastatin '20mg'$  daily.  Labs ordered today.  Return to clinic in 3 months for reevaluation.  Call sooner if concerns arise.  ? ?

## 2021-07-01 NOTE — Assessment & Plan Note (Addendum)
Chronic. Patient is due for lab work.  He has not been taking the Metformin.  Does not check sugars at home.  Will check labs at visit today. Will make recommendations based on lab results.   ?

## 2021-07-02 LAB — LIPID PANEL
Chol/HDL Ratio: 5.4 ratio — ABNORMAL HIGH (ref 0.0–5.0)
Cholesterol, Total: 135 mg/dL (ref 100–199)
HDL: 25 mg/dL — ABNORMAL LOW (ref >39)
LDL Chol Calc (NIH): 73 mg/dL (ref 0–99)
Triglycerides: 219 mg/dL — ABNORMAL HIGH (ref 0–149)
VLDL Cholesterol Cal: 37 mg/dL (ref 5–40)

## 2021-07-02 LAB — COMPREHENSIVE METABOLIC PANEL
ALT: 13 IU/L (ref 0–44)
AST: 15 IU/L (ref 0–40)
Albumin/Globulin Ratio: 2.4 — ABNORMAL HIGH (ref 1.2–2.2)
Albumin: 4.4 g/dL (ref 3.7–4.7)
Alkaline Phosphatase: 51 IU/L (ref 44–121)
BUN/Creatinine Ratio: 10 (ref 10–24)
BUN: 17 mg/dL (ref 8–27)
Bilirubin Total: 0.3 mg/dL (ref 0.0–1.2)
CO2: 22 mmol/L (ref 20–29)
Calcium: 8.8 mg/dL (ref 8.6–10.2)
Chloride: 109 mmol/L — ABNORMAL HIGH (ref 96–106)
Creatinine, Ser: 1.69 mg/dL — ABNORMAL HIGH (ref 0.76–1.27)
Globulin, Total: 1.8 g/dL (ref 1.5–4.5)
Glucose: 106 mg/dL — ABNORMAL HIGH (ref 70–99)
Potassium: 4.8 mmol/L (ref 3.5–5.2)
Sodium: 142 mmol/L (ref 134–144)
Total Protein: 6.2 g/dL (ref 6.0–8.5)
eGFR: 41 mL/min/{1.73_m2} — ABNORMAL LOW (ref >59)

## 2021-07-02 LAB — VITAMIN B12: Vitamin B-12: 85 pg/mL — ABNORMAL LOW (ref 232–1245)

## 2021-07-02 LAB — HEMOGLOBIN A1C
Est. average glucose Bld gHb Est-mCnc: 134 mg/dL
Hgb A1c MFr Bld: 6.3 % — ABNORMAL HIGH (ref 4.8–5.6)

## 2021-07-02 MED ORDER — VITAMIN B-12 1000 MCG PO TABS: 1000.0000 ug | ORAL_TABLET | Freq: Every day | ORAL | 1 refills | Status: AC

## 2021-07-02 NOTE — Addendum Note (Signed)
Addended by: Jon Billings on: 07/02/2021 08:49 AM ? ? Modules accepted: Orders ? ?

## 2021-07-02 NOTE — Progress Notes (Signed)
Please let patient know that his lab work shows that his kidney function is stable.  We will continue to monitor this in the future.  His A1c is well controlled at 6.3.  We are not going to restart the Metformin at this time.  His triglycerides are elevated but improved from prior.  Recommend decreasing processed foods and refined sugar in take.   ? ?His B12 is very low which is contributing to his fatigue.  I have sent a prescription of Vitamin B12 to the pharmacy.  It will be 1000 mcg daily.  ? ?Make sure to keep the follow up for one month and bring his medications with him to his next appt so I can see them.

## 2021-07-08 ENCOUNTER — Ambulatory Visit: Payer: Self-pay

## 2021-07-08 ENCOUNTER — Telehealth: Payer: Medicare Other

## 2021-07-08 DIAGNOSIS — E1169 Type 2 diabetes mellitus with other specified complication: Secondary | ICD-10-CM

## 2021-07-08 DIAGNOSIS — I10 Essential (primary) hypertension: Secondary | ICD-10-CM

## 2021-07-08 DIAGNOSIS — E785 Hyperlipidemia, unspecified: Secondary | ICD-10-CM

## 2021-07-08 DIAGNOSIS — I152 Hypertension secondary to endocrine disorders: Secondary | ICD-10-CM

## 2021-07-08 DIAGNOSIS — G8929 Other chronic pain: Secondary | ICD-10-CM

## 2021-07-08 DIAGNOSIS — Z9181 History of falling: Secondary | ICD-10-CM

## 2021-07-08 DIAGNOSIS — E1165 Type 2 diabetes mellitus with hyperglycemia: Secondary | ICD-10-CM

## 2021-07-08 NOTE — Chronic Care Management (AMB) (Signed)
?Chronic Care Management  ? ?CCM RN Visit Note ? ?07/08/2021 ?Name: Duane Hill. MRN: [id number] DOB: 1940-06-16 ? ?Subjective: ?Duane Hill. is a 81 y.o. year old male who is a primary care patient of Jon Billings, NP. The care management team was consulted for assistance with disease management and care coordination needs.   ? ?Engaged with patient by telephone for follow up visit in response to provider referral for case management and/or care coordination services.  ? ?Consent to Services:  ?The patient was given information about Chronic Care Management services, agreed to services, and gave verbal consent prior to initiation of services.  Please see initial visit note for detailed documentation.  ? ?Patient agreed to services and verbal consent obtained.  ? ?Assessment: Review of patient past medical history, allergies, medications, health status, including review of consultants reports, laboratory and other test data, was performed as part of comprehensive evaluation and provision of chronic care management services.  ? ?SDOH (Social Determinants of Health) assessments and interventions performed:   ? ?CCM Care Plan ? ?Allergies  ?Allergen Reactions  ? Protonix [Pantoprazole Sodium] Nausea And Vomiting  ? ? ?Outpatient Encounter Medications as of 07/08/2021  ?Medication Sig  ? acetaminophen (TYLENOL) 500 MG tablet Take 1,000 mg by mouth every 6 (six) hours as needed. Patient uss Caplet extra strength  ? albuterol (VENTOLIN HFA) 108 (90 Base) MCG/ACT inhaler Take 2 puffs every 4-6 hours as necessary for shortness of breath  ? atorvastatin (LIPITOR) 20 MG tablet Take 1 tablet (20 mg total) by mouth daily.  ? Azelastine HCl 0.15 % SOLN Place 2 sprays into both nostrils 2 (two) times daily. Place 2 sprays into both nostrils 2 (two) times daily.  ? carvedilol (COREG) 25 MG tablet Take 1 tablet (25 mg total) by mouth 2 (two) times daily with a meal.  ? cetirizine (ZYRTEC) 10 MG tablet Take 1 tablet  (10 mg total) by mouth daily.  ? Choline Fenofibrate (FENOFIBRIC ACID) 135 MG CPDR Take 1 capsule by mouth daily.  ? fluticasone (FLONASE) 50 MCG/ACT nasal spray Place 1 spray into both nostrils 2 (two) times daily.  ? gabapentin (NEURONTIN) 300 MG capsule Take 2 capsules (600 mg total) by mouth 2 (two) times daily.  ? glipiZIDE (GLUCOTROL) 5 MG tablet Take 5 mg by mouth 2 (two) times daily before a meal. (Patient not taking: Reported on 07/01/2021)  ? glucose blood test strip CHECK BLOOD SUGAR TWICE DAILY.  ? lisinopril (ZESTRIL) 40 MG tablet Take 1 tablet (40 mg total) by mouth daily.  ? metFORMIN (GLUCOPHAGE-XR) 500 MG 24 hr tablet Take 1 tablet (500 mg total) by mouth in the morning and at bedtime. (Patient not taking: Reported on 07/01/2021)  ? Microlet Lancets MISC USE TWICE DAILY AS DIRECTED.  ? montelukast (SINGULAIR) 10 MG tablet Take 1 tablet (10 mg total) by mouth at bedtime.  ? omeprazole (PRILOSEC) 40 MG capsule Take 1 capsule (40 mg total) by mouth daily.  ? vitamin B-12 (CYANOCOBALAMIN) 1000 MCG tablet Take 1 tablet (1,000 mcg total) by mouth daily.  ? ?No facility-administered encounter medications on file as of 07/08/2021.  ? ? ?Patient Active Problem List  ? Diagnosis Date Noted  ? B12 deficiency 04/29/2020  ? HSV (herpes simplex virus) infection 05/10/2019  ? Allergic rhinitis 02/14/2019  ? Advanced care planning/counseling discussion 11/29/2016  ? GERD (gastroesophageal reflux disease) 11/05/2014  ? Hyperlipidemia associated with type 2 diabetes mellitus ([place]) 11/05/2014  ? Chronic radiation cystitis 11/05/2014  ?  Hypertension associated with diabetes ([place]) 02/02/2013  ? Type 2 diabetes mellitus with hyperglycemia ([place]) 02/02/2013  ? History of prostate cancer 02/02/2013  ? ? ?Conditions to be addressed/monitored:HTN, HLD, DMII, and Chronic pain, falls, and safety ? ?Care Plan : RNCM: General Plan of Care (Adult) for Chronic Disease Management and Care Coordination Needs  ?Updates made by Vanita Ingles, RN since 07/08/2021 12:00 AM  ?  ? ?Problem: RNCM: Development of Plan of Care for Chronic Disease Management (DM, HTM, HLD, Chronic pain, Falls and safety)   ?Priority: High  ?  ? ?Long-Range Goal: RNCM: Effective Management  of Plan of Care for Chronic Disease Management (DM, HTM, HLD, Chronic pain, Falls and safety)   ?Start Date: 03/03/2021  ?Expected End Date: 03/03/2022  ?Priority: High  ?Note:   ?Current Barriers:  ?Knowledge Deficits related to plan of care for management of HTN, HLD, DMII, Falls and Safety Concerns, and Chronic pain  ?Care Coordination needs related to Limited social support, Transportation, Level of care concerns, ADL IADL limitations, Social Isolation, and Inability to perform IADL's independently  ?Chronic Disease Management support and education needs related to HTN, HLD, DMII, Falls and safety concerns, and Chronic pain  ?Lacks caregiver support.        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 ?RNCM Clinical Goal(s):  ?Patient will verbalize understanding of plan for management of HTN, HLD, DMII, and chronic pain and falls as evidenced by compliant with plan of care, working with the CCM team to optimize health and well being ?take all medications exactly as prescribed and will call provider for medication related questions as evidenced by taking medications as directed and calling for refills before running out of medications     ?attend all scheduled medical appointments: The patient does not have any upcoming appointments with the pcp, knows to call for new needs or concerns as evidenced by keeping appointments         ?demonstrate improved and ongoing adherence to prescribed treatment plan for HTN, HLD, DMII, and Chronic pain, falls, and safety as evidenced by reporting new falls, following the plan of care and working with the CCM team to effectively manage health and well being   ?demonstrate improved and ongoing health management independence as evidenced by working with the CCM team to optimize health and well being        ?demonstrate a decrease in HTN, HLD, DMII, and Chronic pains and falls and safety exacerbations  as evidenced by taking medications as prescribed, adherence with medications and dietary restrictions  ?demonstrate ongoing self health care management ability for effective management of chronic conditions  as evidenced by working with CCM team through collaboration with Consulting civil engineer, provider, and care team.  ? ?Interventions: ?1:1 collaboration with primary care provider regarding development and update of comprehensive plan of care as evidenced by provider attestation and co-signature ?Inter-disciplinary care team collaboration (see longitudinal plan of care) ?Evaluation of current treatment plan related to  self management and patient's adherence to plan as established by provider ? ? ?Diabetes:  (Status: Goal on Track (progressing): YES.) Long Term Goal  ? ?Lab Results  ?Component Value Date  ? HGBA1C 6.3 (H) 07/01/2021  ?Assessed patient's understanding of A1c goal: <7% ?Provided education to patient about basic DM disease process; ?Reviewed medications with patient and discussed importance of medication adherence. 05-05-2021: The patient has delivery of medications to his home. 07-08-2021: The  patient has not been taking Metformin;        ?Reviewed prescribed diet with patient heart healthy/ADA. 07-08-2021: Compliant with heart healthy/ADA diet ; ?Counseled on importance of regular laboratory monitoring as prescribed. 07-08-2021: The patient has regular lab work;        ?Discussed plans with patient for ongoing care management follow up and provided patient with direct contact information for care management team;      ?Provided patient with written educational materials related to hypo and hyperglycemia and importance of correct treatment. 05-05-2021: The patient  denies any highs or lows. Does not check often but knows what sx and sx to look for with changes in blood sugars. 07-08-2021: The patient states that he checked his blood sugar today and it was 125.  He denies any shaking

## 2021-07-08 NOTE — Patient Instructions (Signed)
Visit Information ? ?Thank you for taking time to visit with me today. Please don't hesitate to contact me if I can be of assistance to you before our next scheduled telephone appointment. ? ?Following are the goals we discussed today:  ?Current Barriers:  ?Knowledge Deficits related to plan of care for management of HTN, HLD, DMII, Falls and Safety Concerns, and Chronic pain  ?Care Coordination needs related to Limited social support, Transportation, Level of care concerns, ADL IADL limitations, Social Isolation, and Inability to perform IADL's independently  ?Chronic Disease Management support and education needs related to HTN, HLD, DMII, Falls and safety concerns, and Chronic pain  ?Lacks caregiver support.        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  ?RNCM Clinical Goal(s):  ?Patient will verbalize understanding of plan for management of HTN, HLD, DMII, and chronic pain and falls as evidenced by compliant with plan of care, working with the CCM team to optimize health and well being ?take all medications exactly as prescribed and will call provider for medication related questions as evidenced by taking medications as directed and calling for refills before running out of medications     ?attend all scheduled medical appointments: The patient does not have any upcoming appointments with the pcp, knows to call for new needs or concerns as evidenced by keeping appointments         ?demonstrate improved and ongoing adherence to prescribed treatment plan for HTN, HLD, DMII, and Chronic pain, falls, and safety as evidenced by reporting new falls, following the plan of care and working with the CCM team to effectively manage health and well being  ?demonstrate improved and ongoing health management independence as evidenced by working with the CCM team to optimize health and well being        ?demonstrate a decrease in HTN, HLD,  DMII, and Chronic pains and falls and safety exacerbations  as evidenced by taking medications as prescribed, adherence with medications and dietary restrictions  ?demonstrate ongoing self health care management ability for effective management of chronic conditions  as evidenced by working with CCM team through collaboration with Consulting civil engineer, provider, and care team.  ?  ?Interventions: ?1:1 collaboration with primary care provider regarding development and update of comprehensive plan of care as evidenced by provider attestation and co-signature ?Inter-disciplinary care team collaboration (see longitudinal plan of care) ?Evaluation of current treatment plan related to  self management and patient's adherence to plan as established by provider ?  ?  ?Diabetes:  (Status: Goal on Track (progressing): YES.) Long Term Goal  ?  ?     ?Lab Results  ?Component Value Date  ?  HGBA1C 6.3 (H) 07/01/2021  ?Assessed patient's understanding of A1c goal: <7% ?Provided education to patient about basic DM disease process; ?Reviewed medications with patient and discussed importance of medication adherence. 05-05-2021: The patient has delivery of medications to his home. 07-08-2021: The patient has not been taking Metformin;        ?Reviewed prescribed diet with patient heart healthy/ADA. 07-08-2021: Compliant with heart healthy/ADA diet ; ?Counseled on importance of regular laboratory monitoring as prescribed. 07-08-2021: The patient has regular lab work;        ?Discussed plans with patient for ongoing care management follow up and provided patient with direct contact information for care management team;      ?Provided patient with written educational materials related to hypo and  hyperglycemia and importance of correct treatment. 05-05-2021: The patient denies any highs or lows. Does not check often but knows what sx and sx to look for with changes in blood sugars. 07-08-2021: The patient states that he checked his blood sugar  today and it was 125.  He denies any shaking or diaphoretic episodes. Knows the sx and sx of hypoglycemia and hyperglycemia;       ?Reviewed scheduled/upcoming provider appointments including: 08-05-2021 at 340 pm- the patient does not know if he will be able to keep this appointment due to transportation. Collaboration with the LCSW.      ?Advised patient, providing education and rationale, to check cbg as directed  and record. 03-03-2021: The patient states his blood sugar this am was 189 and later 111. 05-05-2021: The patient states a couple of days ago his blood sugar is 139.  He does not worry about his levels.  07-08-2021: The patient states that his blood sugar this am was 125.      ?call provider for findings outside established parameters;       ?Review of patient status, including review of consultants reports, relevant laboratory and other test results, and medications completed;       ?Screening for signs and symptoms of depression related to chronic disease state;        ?Assessed social determinant of health barriers;        ?  ?Falls:  (Status: Goal on Track (progressing): YES.) Long Term Goal  ?Provided written and verbal education re: potential causes of falls and Fall prevention strategies ?Reviewed medications and discussed potential side effects of medications such as dizziness and frequent urination ?Advised patient of importance of notifying provider of falls. 03-03-2021: Education on the importance of notifying the provider of falls. The patient is very high fall risk. The patient does not always use his cane. Encouraged the patient to use DME and be mindful of safety at all times. The patient has worked with PT and that was helpful. He says he gets off balance a lot. Reviewed with the patient safety concerns with living by himself. The patient had mentioned assisted living but states he cannot leave his animals. Empathetic listening and support given. 07-08-2021: The patient had a fall on  07-01-2021 after his doctors appointment when he walked home. Then he fell again on 07-03-2021. On 07-01-2021 he cut his hand but did not hit his head. When the patient fell on Friday he hurt his arm. He states his legs are hurting and he does not know why. The patient is using his cane but still continues to fall.  ?Assessed for signs and symptoms of orthostatic hypotension ?Assessed for falls since last encounter. 03-03-2021: The patient states his last fall was last week when he was getting his groceries in. Has had several falls since the last outreach with RNCM. 05-05-2021: The patient states that he has not had any new falls since the last outreach. He states his sisters car is broke down and she does not have the money to fix it. Her daughter goes and gets her groceries and when she gets her groceries she picks up groceries for the patient. He only goes over to his sisters sometimes. The patient states he is being careful and does not go outside a lot because he is concerned about falls and getting hurt. 07-08-2021: The patient has had falls on 07-01-2021 and 07-03-2021 with minor injuries. Reviewed and education given.  ?Assessed patients knowledge of fall risk  prevention secondary to previously provided education ?Provided patient information for fall alert systems ?Assessed working status of life alert bracelet and patient adherence ?Advised patient to discuss new falls and safety concerns with provider ?Screening for signs and symptoms of depression related to chronic disease state ?Assessed social determinant of health barriers. 07-08-2021: The patient has issues with getting to and keeping his appointments due to transportation. The patient has had care guide referrals with assistance.  ?Care guide referral for local resources that will deliver groceries to his home due to the inability to go to the grocery store. Has transportation resources. 07-08-2021: The patient has talked to the care guides. Had  transportation to his appointment on 3-15 but they did not come and pick him back up. Collaboration with LCSW and [facility] manager for assistance and recommendations to help the patient with transportation barriers.  ?  ?Hyperli

## 2021-07-13 NOTE — Patient Instructions (Addendum)
Duane Hill, ? ?Thank you for talking with me today. I have included our care plan/goals in the following pages.  ? ?Please review and call me at [phone] with any questions. ? ?Thanks! ?Duane Hill  ? ?Madelin Rear, PharmD ?Clinical Pharmacist  ?[phone] ? ?Care Plan : ccm pharmacy care plan  ?Updates made by Madelin Rear, [facility] since 07/13/2021 12:00 AM  ?  ? ?Problem: t2dm htn hld b12 def   ?  ? ?Long-Range Goal: Patient-Specific Goal   ?Start Date: 01/12/2022  ?Recent Progress: On track  ?Priority: High  ?Note:   ?Current Barriers:  ?Med adherence ? ?Pharmacist Clinical Goal(s):  ?Patient will verbalize ability to afford treatment regimen ?achieve adherence to monitoring guidelines and medication adherence to achieve therapeutic efficacy ?maintain control of T2DM as evidenced by BG and a1c<7%  through collaboration with PharmD and provider.  ? ?Interventions: ?1:1 collaboration with Duane Billings, NP regarding development and update of comprehensive plan of care as evidenced by provider attestation and co-signature ?Inter-disciplinary care team collaboration (see longitudinal plan of care) ?Comprehensive medication review performed; medication list updated in electronic medical record ? ?Hypertension (BP goal <140/90) ?-Not ideally controlled ?-High risk ASCVD ?-Fall Hx, cautious about showers. Some small animals at home.   ?-GFRs in 40s  ?-Current treatment: ?Carvedilol 25 mg twice daily (restarted) ?HCTZ 25 mg once daily (not taking?) ?lisinopril 40 mg once daily  ?-Medications previously tried: quinapril 40 mg once daily (switch to lisinopril - insurance/pharmacy) ?-Current home readings: machine broke, not checking  ?-Denies hypotensive/hypertensive symptoms ?-Educated on BP goals and benefits of medications for prevention of heart attack, stroke and kidney damage; ?-Counseled to monitor BP at home 1-2x/wk, document, and provide log at future appointments ?-Counseled on diet and exercise  extensively ?Recommended to continue current medication ?-Using ibuprofen 2 tabs twice daily - stop and use tylenol prn  ?-drinks white wine 3-4 glasses throughout the day - will cut down, work on less than half glass per day  ? ?Hyperlipidemia: (LDL goal < 100) ?-Ucontrolled ?-High risk ASCVD, fibrate appropriate per ACCORD ?-Current treatment: ?Atorvastatin 20 mg once daily  ?Choline fenofibrate 135 mg once daily  ?-Medications previously tried: Simvastatin 40 mg once daily.   ?-Educated on Cholesterol goals;  ?Benefits of statin for ASCVD risk reduction; ?Could consider intensification to atorvastatin 40 mg due to ASCVD 10-yr risk >60% ?-Counseled on diet and exercise extensively ?Recommended to continue current medication ? ?Diabetes (A1c goal <7%) ?-now improved-controlled  ?-GFR 40s ?-B12 of 77 04/2020 ?-Current medications: ?Metformin XR 500 mg BID ?-Medications previously tried: Higher dose metformin (GI complaints), SGLT2 (infxn)  ?-Current home glucose readings ?fasting glucose: 130 ?post prandial glucose: not testing. ?-Denies hypoglycemic/hyperglycemic symptoms ?-Current meal patterns: now getting meals on wheels. previous: country ham, likes salty food. Not a lot sweets. Cut down on bread considerably. Really likes greens but accessibility issues. ?-Current exercise: limited, some pain while walking.  ?-Educated on A1c and blood sugar goals; ?Benefits of routine self-monitoring of blood sugar; ?-Counseled to check feet daily and get yearly eye exams ?-Recommended to continue current medication ?Assessed patient finances. ozempic PAP - based on income reported would qualify for assistance if needed. Open to giving himself once weekly injection if needed.  ?-Could review for CKD staging due to-GFRs in 40s 01/05/2021, 10/03/2020, 04/28/2020 ?  ? ?The patient verbalized understanding of instructions provided today and agreed to receive a MyChart copy of patient instruction and/or educational  materials. ?Telephone follow up appointment with pharmacy team  member scheduled for: See next appointment with "Care Management Staff" under "What's Next" below.   ?

## 2021-07-15 ENCOUNTER — Other Ambulatory Visit: Payer: Self-pay | Admitting: Nurse Practitioner

## 2021-07-16 NOTE — Telephone Encounter (Signed)
Requested Prescriptions  ?Pending Prescriptions Disp Refills  ?? metFORMIN (GLUCOPHAGE-XR) 500 MG 24 hr tablet [Pharmacy Med Name: METFORMIN HCL ER 500 MG TABLET] 180 tablet 0  ?  Sig: Take 1 tablet (500 mg total) by mouth in the morning and at bedtime.  ?  ? Endocrinology:  Diabetes - Biguanides Failed - 07/15/2021  1:31 PM  ?  ?  Failed - Cr in normal range and within 360 days  ?  Creatinine  ?Date Value Ref Range Status  ?08/16/2014 1.11 mg/dL Final  ?  Comment:  ?  0.61-1.24 ?NOTE: New Reference Range ? 06/25/14 ?  ? ?Creatinine, Ser  ?Date Value Ref Range Status  ?07/01/2021 1.69 (H) 0.76 - 1.27 mg/dL Final  ?   ?  ?  Failed - eGFR in normal range and within 360 days  ?  EGFR (African American)  ?Date Value Ref Range Status  ?08/16/2014 >60  Final  ? ?GFR calc Af Wyvonnia Lora  ?Date Value Ref Range Status  ?04/28/2020 56 (L) >59 mL/min/1.73 Final  ?  Comment:  ?  **In accordance with recommendations from the NKF-ASN Task force,** ?  [facility] is in the process of updating its eGFR calculation to the ?  2021 CKD-EPI creatinine equation that estimates kidney function ?  without a race variable. ?  ? ?EGFR (Non-African Amer.)  ?Date Value Ref Range Status  ?08/16/2014 >60  Final  ?  Comment:  ?  eGFR values <67mL/min/1.73 m2 may be an indication of chronic ?kidney disease (CKD). ?Calculated eGFR is useful in patients with stable renal function. ?The eGFR calculation will not be reliable in acutely ill patients ?when serum creatinine is changing rapidly. It is not useful in ?patients on dialysis. The eGFR calculation may not be applicable ?to patients at the low and high extremes of body sizes, pregnant ?women, and vegetarians. ?  ? ?GFR calc non Af Amer  ?Date Value Ref Range Status  ?04/28/2020 48 (L) >59 mL/min/1.73 Final  ? ?eGFR  ?Date Value Ref Range Status  ?07/01/2021 41 (L) >59 mL/min/1.73 Final  ?   ?  ?  Failed - B12 Level in normal range and within 720 days  ?  Vitamin B-12  ?Date Value Ref Range Status   ?07/01/2021 85 (L) 232 - 1,245 pg/mL Final  ?   ?  ?  Failed - CBC within normal limits and completed in the last 12 months  ?  WBC  ?Date Value Ref Range Status  ?02/20/2018 7.6 3.4 - 10.8 x10E3/uL Final  ?08/16/2014 5.8 3.8 - 10.6 x10 3/mm 3 Final  ? ?RBC  ?Date Value Ref Range Status  ?02/20/2018 4.35 4.14 - 5.80 x10E6/uL Final  ?08/16/2014 3.77 (L) 4.40 - 5.90 x10 6/mm 3 Final  ? ?Hemoglobin  ?Date Value Ref Range Status  ?02/20/2018 11.7 (L) 13.0 - 17.7 g/dL Final  ? ?Hematocrit  ?Date Value Ref Range Status  ?02/20/2018 36.2 (L) 37.5 - 51.0 % Final  ? ?MCHC  ?Date Value Ref Range Status  ?02/20/2018 32.3 31.5 - 35.7 g/dL Final  ?08/16/2014 32.9 32.0 - 36.0 g/dL Final  ? ?MCH  ?Date Value Ref Range Status  ?02/20/2018 26.9 26.6 - 33.0 pg Final  ?08/16/2014 28.9 26.0 - 34.0 pg Final  ? ?MCV  ?Date Value Ref Range Status  ?02/20/2018 83 79 - 97 fL Final  ?08/16/2014 88 80 - 100 fL Final  ? ?No results found for: PLTCOUNTKUC, LABPLAT, [place] ?RDW  ?Date Value Ref Range Status  ?  02/20/2018 14.2 12.3 - 15.4 % Final  ?08/16/2014 13.4 11.5 - 14.5 % Final  ? ?  ?  ?  Passed - HBA1C is between 0 and 7.9 and within 180 days  ?  Hemoglobin A1C  ?Date Value Ref Range Status  ?08/15/2014 6.7 (H) % Final  ?  Comment:  ?  4.0-6.0 ?NOTE: New Reference Range ? 06/25/14 ?  ? ?HB A1C (BAYER DCA - WAIVED)  ?Date Value Ref Range Status  ?04/28/2020 7.2 (H) <7.0 % Final  ?  Comment:  ?                                        Diabetic Adult            <7.0 ?                                      Healthy Adult        4.3 - 5.7 ?                                                          (DCCT/NGSP) ?American Diabetes Association's Summary of Glycemic Recommendations ?for Adults with Diabetes: Hemoglobin A1c <7.0%. More stringent ?glycemic goals (A1c <6.0%) may further reduce complications at the ?cost of increased risk of hypoglycemia. ?  ? ?Hgb A1c MFr Bld  ?Date Value Ref Range Status  ?07/01/2021 6.3 (H) 4.8 - 5.6 % Final  ?  Comment:   ?           Prediabetes: 5.7 - 6.4 ?         Diabetes: >6.4 ?         Glycemic control for adults with diabetes: <7.0 ?  ?   ?  ?  Passed - Valid encounter within last 6 months  ?  Recent Outpatient Visits   ?      ? 2 weeks ago Hypertension associated with diabetes ([place])  ? [place], NP  ? 6 months ago Hypertension associated with diabetes [facility])  ? [place], NP  ? 9 months ago Hypertension associated with diabetes [facility])  ? [place], NP  ? 1 year ago Type 2 diabetes mellitus with hyperglycemia, without long-term current use of insulin ([place])  ? [place], [place] T, NP  ? 1 year ago Type 2 diabetes mellitus with hyperglycemia, without long-term current use of insulin ([place])  ? [place], [place] P, DO  ?  ?  ?Future Appointments   ?        ? In 2 weeks Jon Billings, NP [facility], [facility]  ?  ? ?  ?  ?  ?? omeprazole (PRILOSEC) 40 MG capsule [Pharmacy Med Name: OMEPRAZOLE DR 40 MG CAPSULE] 30 capsule 0  ?  Sig: Take 1 capsule (40 mg total) by mouth daily.  ?  ? Gastroenterology: Proton Pump Inhibitors Passed - 07/15/2021  1:31 PM  ?  ?  Passed - Valid encounter within last 12 months  ?  Recent Outpatient Visits   ?      ?  2 weeks ago Hypertension associated with diabetes ([place])  ? [place], NP  ? 6 months ago Hypertension associated with diabetes [facility])  ? [place], NP  ? 9 months ago Hypertension associated with diabetes [facility])  ? [place], NP  ? 1 year ago Type 2 diabetes mellitus with hyperglycemia, without long-term current use of insulin ([place])  ? [place], [place] T, NP  ? 1 year ago Type 2 diabetes mellitus with hyperglycemia, without long-term current use of insulin ([place])  ? [place], [place] P, DO  ?  ?  ?Future  Appointments   ?        ? In 2 weeks Jon Billings, NP [facility], [facility]  ?  ? ?  ?  ?  ? ?

## 2021-07-17 DIAGNOSIS — E1165 Type 2 diabetes mellitus with hyperglycemia: Secondary | ICD-10-CM

## 2021-07-17 DIAGNOSIS — I152 Hypertension secondary to endocrine disorders: Secondary | ICD-10-CM

## 2021-07-17 DIAGNOSIS — I10 Essential (primary) hypertension: Secondary | ICD-10-CM | POA: Diagnosis not present

## 2021-07-17 DIAGNOSIS — E1169 Type 2 diabetes mellitus with other specified complication: Secondary | ICD-10-CM

## 2021-07-17 DIAGNOSIS — E785 Hyperlipidemia, unspecified: Secondary | ICD-10-CM

## 2021-07-17 DIAGNOSIS — E1159 Type 2 diabetes mellitus with other circulatory complications: Secondary | ICD-10-CM

## 2021-07-19 ENCOUNTER — Emergency Department: Payer: Medicare Other

## 2021-07-19 ENCOUNTER — Other Ambulatory Visit: Payer: Self-pay

## 2021-07-19 ENCOUNTER — Inpatient Hospital Stay: Payer: Medicare Other

## 2021-07-19 ENCOUNTER — Inpatient Hospital Stay
Admission: EM | Admit: 2021-07-19 | Discharge: 2021-07-24 | DRG: 698 | Disposition: A | Discharge status: Skilled Nursing Facility | Payer: Medicare Other | Attending: Internal Medicine | Admitting: Internal Medicine

## 2021-07-19 DIAGNOSIS — E876 Hypokalemia: Secondary | ICD-10-CM | POA: Diagnosis not present

## 2021-07-19 DIAGNOSIS — G8191 Hemiplegia, unspecified affecting right dominant side: Secondary | ICD-10-CM | POA: Diagnosis present

## 2021-07-19 DIAGNOSIS — R4781 Slurred speech: Secondary | ICD-10-CM | POA: Diagnosis not present

## 2021-07-19 DIAGNOSIS — C61 Malignant neoplasm of prostate: Secondary | ICD-10-CM | POA: Diagnosis present

## 2021-07-19 DIAGNOSIS — Z7401 Bed confinement status: Secondary | ICD-10-CM | POA: Diagnosis not present

## 2021-07-19 DIAGNOSIS — G459 Transient cerebral ischemic attack, unspecified: Secondary | ICD-10-CM | POA: Diagnosis present

## 2021-07-19 DIAGNOSIS — Z515 Encounter for palliative care: Secondary | ICD-10-CM | POA: Diagnosis not present

## 2021-07-19 DIAGNOSIS — I6389 Other cerebral infarction: Secondary | ICD-10-CM | POA: Diagnosis not present

## 2021-07-19 DIAGNOSIS — N281 Cyst of kidney, acquired: Secondary | ICD-10-CM | POA: Diagnosis not present

## 2021-07-19 DIAGNOSIS — I21A1 Myocardial infarction type 2: Secondary | ICD-10-CM | POA: Diagnosis not present

## 2021-07-19 DIAGNOSIS — I214 Non-ST elevation (NSTEMI) myocardial infarction: Secondary | ICD-10-CM | POA: Diagnosis present

## 2021-07-19 DIAGNOSIS — Z7189 Other specified counseling: Secondary | ICD-10-CM

## 2021-07-19 DIAGNOSIS — J45909 Unspecified asthma, uncomplicated: Secondary | ICD-10-CM | POA: Diagnosis present

## 2021-07-19 DIAGNOSIS — A419 Sepsis, unspecified organism: Secondary | ICD-10-CM | POA: Diagnosis present

## 2021-07-19 DIAGNOSIS — I152 Hypertension secondary to endocrine disorders: Secondary | ICD-10-CM | POA: Diagnosis present

## 2021-07-19 DIAGNOSIS — E875 Hyperkalemia: Secondary | ICD-10-CM | POA: Diagnosis not present

## 2021-07-19 DIAGNOSIS — N3289 Other specified disorders of bladder: Secondary | ICD-10-CM | POA: Diagnosis not present

## 2021-07-19 DIAGNOSIS — R319 Hematuria, unspecified: Secondary | ICD-10-CM | POA: Diagnosis not present

## 2021-07-19 DIAGNOSIS — E785 Hyperlipidemia, unspecified: Secondary | ICD-10-CM | POA: Diagnosis not present

## 2021-07-19 DIAGNOSIS — N138 Other obstructive and reflux uropathy: Secondary | ICD-10-CM | POA: Diagnosis present

## 2021-07-19 DIAGNOSIS — Z79899 Other long term (current) drug therapy: Secondary | ICD-10-CM

## 2021-07-19 DIAGNOSIS — N501 Vascular disorders of male genital organs: Secondary | ICD-10-CM | POA: Diagnosis not present

## 2021-07-19 DIAGNOSIS — K760 Fatty (change of) liver, not elsewhere classified: Secondary | ICD-10-CM | POA: Diagnosis present

## 2021-07-19 DIAGNOSIS — Z8546 Personal history of malignant neoplasm of prostate: Secondary | ICD-10-CM

## 2021-07-19 DIAGNOSIS — N3041 Irradiation cystitis with hematuria: Secondary | ICD-10-CM | POA: Diagnosis present

## 2021-07-19 DIAGNOSIS — I69328 Other speech and language deficits following cerebral infarction: Secondary | ICD-10-CM | POA: Diagnosis not present

## 2021-07-19 DIAGNOSIS — M6259 Muscle wasting and atrophy, not elsewhere classified, multiple sites: Secondary | ICD-10-CM | POA: Diagnosis not present

## 2021-07-19 DIAGNOSIS — I10 Essential (primary) hypertension: Secondary | ICD-10-CM | POA: Diagnosis not present

## 2021-07-19 DIAGNOSIS — Z823 Family history of stroke: Secondary | ICD-10-CM

## 2021-07-19 DIAGNOSIS — M79669 Pain in unspecified lower leg: Secondary | ICD-10-CM | POA: Diagnosis not present

## 2021-07-19 DIAGNOSIS — Z87891 Personal history of nicotine dependence: Secondary | ICD-10-CM

## 2021-07-19 DIAGNOSIS — R52 Pain, unspecified: Secondary | ICD-10-CM | POA: Diagnosis not present

## 2021-07-19 DIAGNOSIS — Y842 Radiological procedure and radiotherapy as the cause of abnormal reaction of the patient, or of later complication, without mention of misadventure at the time of the procedure: Secondary | ICD-10-CM | POA: Diagnosis present

## 2021-07-19 DIAGNOSIS — Z66 Do not resuscitate: Secondary | ICD-10-CM | POA: Diagnosis present

## 2021-07-19 DIAGNOSIS — R2681 Unsteadiness on feet: Secondary | ICD-10-CM | POA: Diagnosis not present

## 2021-07-19 DIAGNOSIS — R7401 Elevation of levels of liver transaminase levels: Secondary | ICD-10-CM

## 2021-07-19 DIAGNOSIS — L8990 Pressure ulcer of unspecified site, unspecified stage: Secondary | ICD-10-CM | POA: Insufficient documentation

## 2021-07-19 DIAGNOSIS — I129 Hypertensive chronic kidney disease with stage 1 through stage 4 chronic kidney disease, or unspecified chronic kidney disease: Secondary | ICD-10-CM | POA: Diagnosis not present

## 2021-07-19 DIAGNOSIS — R29818 Other symptoms and signs involving the nervous system: Secondary | ICD-10-CM | POA: Diagnosis not present

## 2021-07-19 DIAGNOSIS — R197 Diarrhea, unspecified: Secondary | ICD-10-CM | POA: Diagnosis not present

## 2021-07-19 DIAGNOSIS — K7200 Acute and subacute hepatic failure without coma: Secondary | ICD-10-CM | POA: Diagnosis not present

## 2021-07-19 DIAGNOSIS — Z20822 Contact with and (suspected) exposure to covid-19: Secondary | ICD-10-CM | POA: Diagnosis present

## 2021-07-19 DIAGNOSIS — R1312 Dysphagia, oropharyngeal phase: Secondary | ICD-10-CM | POA: Diagnosis not present

## 2021-07-19 DIAGNOSIS — R0689 Other abnormalities of breathing: Secondary | ICD-10-CM | POA: Diagnosis not present

## 2021-07-19 DIAGNOSIS — K8020 Calculus of gallbladder without cholecystitis without obstruction: Secondary | ICD-10-CM | POA: Diagnosis present

## 2021-07-19 DIAGNOSIS — R41841 Cognitive communication deficit: Secondary | ICD-10-CM | POA: Diagnosis not present

## 2021-07-19 DIAGNOSIS — I6622 Occlusion and stenosis of left posterior cerebral artery: Secondary | ICD-10-CM | POA: Diagnosis present

## 2021-07-19 DIAGNOSIS — Z888 Allergy status to other drugs, medicaments and biological substances status: Secondary | ICD-10-CM

## 2021-07-19 DIAGNOSIS — N1330 Unspecified hydronephrosis: Secondary | ICD-10-CM | POA: Diagnosis present

## 2021-07-19 DIAGNOSIS — N3040 Irradiation cystitis without hematuria: Secondary | ICD-10-CM | POA: Diagnosis present

## 2021-07-19 DIAGNOSIS — I639 Cerebral infarction, unspecified: Secondary | ICD-10-CM | POA: Diagnosis not present

## 2021-07-19 DIAGNOSIS — N1832 Chronic kidney disease, stage 3b: Secondary | ICD-10-CM | POA: Diagnosis present

## 2021-07-19 DIAGNOSIS — R1084 Generalized abdominal pain: Secondary | ICD-10-CM | POA: Diagnosis not present

## 2021-07-19 DIAGNOSIS — R918 Other nonspecific abnormal finding of lung field: Secondary | ICD-10-CM | POA: Diagnosis not present

## 2021-07-19 DIAGNOSIS — M1990 Unspecified osteoarthritis, unspecified site: Secondary | ICD-10-CM | POA: Diagnosis present

## 2021-07-19 DIAGNOSIS — J309 Allergic rhinitis, unspecified: Secondary | ICD-10-CM | POA: Diagnosis not present

## 2021-07-19 DIAGNOSIS — R531 Weakness: Secondary | ICD-10-CM | POA: Diagnosis present

## 2021-07-19 DIAGNOSIS — L89152 Pressure ulcer of sacral region, stage 2: Secondary | ICD-10-CM | POA: Diagnosis present

## 2021-07-19 DIAGNOSIS — E1122 Type 2 diabetes mellitus with diabetic chronic kidney disease: Secondary | ICD-10-CM | POA: Diagnosis present

## 2021-07-19 DIAGNOSIS — J9601 Acute respiratory failure with hypoxia: Secondary | ICD-10-CM | POA: Diagnosis present

## 2021-07-19 DIAGNOSIS — U071 COVID-19: Secondary | ICD-10-CM | POA: Diagnosis not present

## 2021-07-19 DIAGNOSIS — R471 Dysarthria and anarthria: Secondary | ICD-10-CM | POA: Diagnosis present

## 2021-07-19 DIAGNOSIS — D62 Acute posthemorrhagic anemia: Secondary | ICD-10-CM | POA: Diagnosis present

## 2021-07-19 DIAGNOSIS — N179 Acute kidney failure, unspecified: Secondary | ICD-10-CM | POA: Diagnosis present

## 2021-07-19 DIAGNOSIS — K219 Gastro-esophageal reflux disease without esophagitis: Secondary | ICD-10-CM | POA: Diagnosis present

## 2021-07-19 DIAGNOSIS — D649 Anemia, unspecified: Principal | ICD-10-CM | POA: Diagnosis present

## 2021-07-19 DIAGNOSIS — I69321 Dysphasia following cerebral infarction: Secondary | ICD-10-CM | POA: Diagnosis not present

## 2021-07-19 DIAGNOSIS — Z7984 Long term (current) use of oral hypoglycemic drugs: Secondary | ICD-10-CM

## 2021-07-19 DIAGNOSIS — R29810 Facial weakness: Secondary | ICD-10-CM | POA: Diagnosis present

## 2021-07-19 DIAGNOSIS — R571 Hypovolemic shock: Secondary | ICD-10-CM | POA: Diagnosis present

## 2021-07-19 DIAGNOSIS — R339 Retention of urine, unspecified: Secondary | ICD-10-CM | POA: Diagnosis not present

## 2021-07-19 DIAGNOSIS — J9602 Acute respiratory failure with hypercapnia: Secondary | ICD-10-CM | POA: Diagnosis not present

## 2021-07-19 DIAGNOSIS — R310 Gross hematuria: Secondary | ICD-10-CM | POA: Diagnosis not present

## 2021-07-19 DIAGNOSIS — R0682 Tachypnea, not elsewhere classified: Secondary | ICD-10-CM | POA: Diagnosis not present

## 2021-07-19 DIAGNOSIS — M6281 Muscle weakness (generalized): Secondary | ICD-10-CM | POA: Diagnosis not present

## 2021-07-19 DIAGNOSIS — D61818 Other pancytopenia: Secondary | ICD-10-CM | POA: Diagnosis present

## 2021-07-19 DIAGNOSIS — E1169 Type 2 diabetes mellitus with other specified complication: Secondary | ICD-10-CM | POA: Diagnosis present

## 2021-07-19 DIAGNOSIS — J9600 Acute respiratory failure, unspecified whether with hypoxia or hypercapnia: Secondary | ICD-10-CM | POA: Diagnosis not present

## 2021-07-19 DIAGNOSIS — I959 Hypotension, unspecified: Secondary | ICD-10-CM | POA: Diagnosis not present

## 2021-07-19 DIAGNOSIS — R1011 Right upper quadrant pain: Secondary | ICD-10-CM | POA: Diagnosis not present

## 2021-07-19 DIAGNOSIS — R1311 Dysphagia, oral phase: Secondary | ICD-10-CM | POA: Diagnosis present

## 2021-07-19 DIAGNOSIS — E1159 Type 2 diabetes mellitus with other circulatory complications: Secondary | ICD-10-CM | POA: Diagnosis present

## 2021-07-19 DIAGNOSIS — J9811 Atelectasis: Secondary | ICD-10-CM | POA: Diagnosis not present

## 2021-07-19 DIAGNOSIS — R0902 Hypoxemia: Secondary | ICD-10-CM

## 2021-07-19 DIAGNOSIS — R4701 Aphasia: Secondary | ICD-10-CM | POA: Diagnosis present

## 2021-07-19 DIAGNOSIS — Z8551 Personal history of malignant neoplasm of bladder: Secondary | ICD-10-CM

## 2021-07-19 DIAGNOSIS — D696 Thrombocytopenia, unspecified: Secondary | ICD-10-CM

## 2021-07-19 DIAGNOSIS — M6282 Rhabdomyolysis: Secondary | ICD-10-CM | POA: Diagnosis present

## 2021-07-19 DIAGNOSIS — E1165 Type 2 diabetes mellitus with hyperglycemia: Secondary | ICD-10-CM | POA: Diagnosis present

## 2021-07-19 DIAGNOSIS — N139 Obstructive and reflux uropathy, unspecified: Secondary | ICD-10-CM | POA: Diagnosis not present

## 2021-07-19 DIAGNOSIS — Z833 Family history of diabetes mellitus: Secondary | ICD-10-CM

## 2021-07-19 DIAGNOSIS — R569 Unspecified convulsions: Secondary | ICD-10-CM | POA: Diagnosis not present

## 2021-07-19 DIAGNOSIS — Z8249 Family history of ischemic heart disease and other diseases of the circulatory system: Secondary | ICD-10-CM

## 2021-07-19 DIAGNOSIS — R2689 Other abnormalities of gait and mobility: Secondary | ICD-10-CM | POA: Diagnosis not present

## 2021-07-19 DIAGNOSIS — R7989 Other specified abnormal findings of blood chemistry: Secondary | ICD-10-CM | POA: Diagnosis not present

## 2021-07-19 DIAGNOSIS — I6359 Cerebral infarction due to unspecified occlusion or stenosis of other cerebral artery: Secondary | ICD-10-CM | POA: Diagnosis not present

## 2021-07-19 DIAGNOSIS — R0602 Shortness of breath: Secondary | ICD-10-CM | POA: Diagnosis not present

## 2021-07-19 DIAGNOSIS — R262 Difficulty in walking, not elsewhere classified: Secondary | ICD-10-CM | POA: Diagnosis not present

## 2021-07-19 DIAGNOSIS — E11649 Type 2 diabetes mellitus with hypoglycemia without coma: Secondary | ICD-10-CM | POA: Diagnosis not present

## 2021-07-19 HISTORY — DX: Hypoxemia: R09.02

## 2021-07-19 HISTORY — DX: Acute respiratory failure with hypoxia: J96.01

## 2021-07-19 HISTORY — DX: Hyperkalemia: E87.5

## 2021-07-19 LAB — COMPREHENSIVE METABOLIC PANEL
ALT: 47 U/L — ABNORMAL HIGH (ref 0–44)
AST: 129 U/L — ABNORMAL HIGH (ref 15–41)
Albumin: 3.3 g/dL — ABNORMAL LOW (ref 3.5–5.0)
Alkaline Phosphatase: 41 U/L (ref 38–126)
Anion gap: 21 — ABNORMAL HIGH (ref 5–15)
BUN: 71 mg/dL — ABNORMAL HIGH (ref 8–23)
CO2: 10 mmol/L — ABNORMAL LOW (ref 22–32)
Calcium: 7.5 mg/dL — ABNORMAL LOW (ref 8.9–10.3)
Chloride: 103 mmol/L (ref 98–111)
Creatinine, Ser: 6.68 mg/dL — ABNORMAL HIGH (ref 0.61–1.24)
GFR, Estimated: 8 mL/min — ABNORMAL LOW (ref >60)
Glucose, Bld: 192 mg/dL — ABNORMAL HIGH (ref 70–99)
Potassium: 6.9 mmol/L (ref 3.5–5.1)
Sodium: 134 mmol/L — ABNORMAL LOW (ref 135–145)
Total Bilirubin: 1 mg/dL (ref 0.3–1.2)
Total Protein: 5.8 g/dL — ABNORMAL LOW (ref 6.5–8.1)

## 2021-07-19 LAB — HEMOGLOBIN AND HEMATOCRIT, BLOOD
HCT: 24.4 % — ABNORMAL LOW (ref 39.0–52.0)
HCT: 21.5 % — ABNORMAL LOW (ref 39.0–52.0)
Hemoglobin: 8.3 g/dL — ABNORMAL LOW (ref 13.0–17.0)
Hemoglobin: 7.2 g/dL — ABNORMAL LOW (ref 13.0–17.0)

## 2021-07-19 LAB — CBC WITH DIFFERENTIAL/PLATELET
Abs Immature Granulocytes: 0.5 10*3/uL — ABNORMAL HIGH (ref 0.00–0.07)
Band Neutrophils: 21 %
Basophils Absolute: 0 10*3/uL (ref 0.0–0.1)
Basophils Relative: 0 %
Eosinophils Absolute: 0 10*3/uL (ref 0.0–0.5)
Eosinophils Relative: 0 %
HCT: 24.3 % — ABNORMAL LOW (ref 39.0–52.0)
Hemoglobin: 7.6 g/dL — ABNORMAL LOW (ref 13.0–17.0)
Lymphocytes Relative: 28 %
Lymphs Abs: 2.5 10*3/uL (ref 0.7–4.0)
MCH: 29.1 pg (ref 26.0–34.0)
MCHC: 31.3 g/dL (ref 30.0–36.0)
MCV: 93.1 fL (ref 80.0–100.0)
Metamyelocytes Relative: 4 %
Monocytes Absolute: 0.2 10*3/uL (ref 0.1–1.0)
Monocytes Relative: 2 %
Myelocytes: 2 %
Neutro Abs: 5.5 10*3/uL (ref 1.7–7.7)
Neutrophils Relative %: 41 %
Other: 2 %
Platelets: 146 10*3/uL — ABNORMAL LOW (ref 150–400)
RBC: 2.61 MIL/uL — ABNORMAL LOW (ref 4.22–5.81)
RDW: 15.6 % — ABNORMAL HIGH (ref 11.5–15.5)
WBC: 8.9 10*3/uL (ref 4.0–10.5)
nRBC: 0 % (ref 0.0–0.2)

## 2021-07-19 LAB — POTASSIUM
Potassium: 5.3 mmol/L — ABNORMAL HIGH (ref 3.5–5.1)
Potassium: 5.6 mmol/L — ABNORMAL HIGH (ref 3.5–5.1)
Potassium: 5.8 mmol/L — ABNORMAL HIGH (ref 3.5–5.1)

## 2021-07-19 LAB — BLOOD GAS, ARTERIAL
Acid-base deficit: 10.5 mmol/L — ABNORMAL HIGH (ref 0.0–2.0)
Bicarbonate: 13.7 mmol/L — ABNORMAL LOW (ref 20.0–28.0)
FIO2: 35 %
O2 Saturation: 100 %
Patient temperature: 37
pCO2 arterial: 26 mmHg — ABNORMAL LOW (ref 32–48)
pH, Arterial: 7.33 — ABNORMAL LOW (ref 7.35–7.45)
pO2, Arterial: 154 mmHg — ABNORMAL HIGH (ref 83–108)

## 2021-07-19 LAB — RESP PANEL BY RT-PCR (FLU A&B, COVID) ARPGX2
Influenza A by PCR: NEGATIVE
Influenza B by PCR: NEGATIVE
SARS Coronavirus 2 by RT PCR: NEGATIVE

## 2021-07-19 LAB — MRSA NEXT GEN BY PCR, NASAL: MRSA by PCR Next Gen: NOT DETECTED

## 2021-07-19 LAB — PROTIME-INR
INR: 2 — ABNORMAL HIGH (ref 0.8–1.2)
Prothrombin Time: 22.6 seconds — ABNORMAL HIGH (ref 11.4–15.2)

## 2021-07-19 LAB — CBG MONITORING, ED
Glucose-Capillary: 170 mg/dL — ABNORMAL HIGH (ref 70–99)
Glucose-Capillary: 111 mg/dL — ABNORMAL HIGH (ref 70–99)

## 2021-07-19 LAB — BLOOD GAS, VENOUS
Acid-base deficit: 10.5 mmol/L — ABNORMAL HIGH (ref 0.0–2.0)
Bicarbonate: 14.6 mmol/L — ABNORMAL LOW (ref 20.0–28.0)
O2 Saturation: 43.6 %
Patient temperature: 37
pCO2, Ven: 29 mmHg — ABNORMAL LOW (ref 44–60)
pH, Ven: 7.31 (ref 7.25–7.43)

## 2021-07-19 LAB — TROPONIN I (HIGH SENSITIVITY)
Troponin I (High Sensitivity): 77 ng/L — ABNORMAL HIGH (ref <18)
Troponin I (High Sensitivity): 327 ng/L (ref <18)

## 2021-07-19 LAB — ABO/RH: ABO/RH(D): A POS

## 2021-07-19 LAB — PREPARE RBC (CROSSMATCH)

## 2021-07-19 LAB — GLUCOSE, CAPILLARY
Glucose-Capillary: 196 mg/dL — ABNORMAL HIGH (ref 70–99)
Glucose-Capillary: 282 mg/dL — ABNORMAL HIGH (ref 70–99)
Glucose-Capillary: 73 mg/dL (ref 70–99)
Glucose-Capillary: 89 mg/dL (ref 70–99)

## 2021-07-19 LAB — PROCALCITONIN: Procalcitonin: >150 ng/mL

## 2021-07-19 LAB — APTT: aPTT: 40 seconds — ABNORMAL HIGH (ref 24–36)

## 2021-07-19 MED ORDER — LACTATED RINGERS IV SOLN: INTRAVENOUS | Status: DC

## 2021-07-19 MED ORDER — INSULIN ASPART 100 UNIT/ML IJ SOLN
0.0000 [IU] | INTRAMUSCULAR | Status: DC
  Administered 2021-07-19: 2 [IU] via SUBCUTANEOUS
  Administered 2021-07-20 (×2): 1 [IU] via SUBCUTANEOUS
  Administered 2021-07-20: 2 [IU] via SUBCUTANEOUS
  Administered 2021-07-20: 5 [IU] via SUBCUTANEOUS
  Administered 2021-07-20: 1 [IU] via SUBCUTANEOUS
  Administered 2021-07-20: 5 [IU] via SUBCUTANEOUS
  Administered 2021-07-21: 2 [IU] via SUBCUTANEOUS
  Administered 2021-07-21 – 2021-07-22 (×3): 1 [IU] via SUBCUTANEOUS
  Administered 2021-07-22: 2 [IU] via SUBCUTANEOUS
  Administered 2021-07-23: 1 [IU] via SUBCUTANEOUS
  Filled 2021-07-19 (×13): qty 1

## 2021-07-19 MED ORDER — CHLORHEXIDINE GLUCONATE CLOTH 2 % EX PADS
6.0000 | MEDICATED_PAD | Freq: Every day | CUTANEOUS | Status: DC
  Administered 2021-07-19 – 2021-07-24 (×6): 6 via TOPICAL

## 2021-07-19 MED ORDER — SODIUM BICARBONATE 8.4 % IV SOLN
50.0000 meq | Freq: Once | INTRAVENOUS | Status: AC
  Administered 2021-07-19: 50 meq via INTRAVENOUS

## 2021-07-19 MED ORDER — CALCIUM GLUCONATE 10 % IV SOLN
1.0000 g | Freq: Once | INTRAVENOUS | Status: AC
  Administered 2021-07-19: 1 g via INTRAVENOUS
  Filled 2021-07-19: qty 10

## 2021-07-19 MED ORDER — SODIUM BICARBONATE 8.4 % IV SOLN
50.0000 meq | Freq: Once | INTRAVENOUS | Status: AC
  Administered 2021-07-19: 50 meq via INTRAVENOUS
  Filled 2021-07-19: qty 150

## 2021-07-19 MED ORDER — SODIUM ZIRCONIUM CYCLOSILICATE 10 G PO PACK
10.0000 g | PACK | Freq: Once | ORAL | Status: AC
  Administered 2021-07-19: 10 g via ORAL
  Filled 2021-07-19: qty 1

## 2021-07-19 MED ORDER — SODIUM BICARBONATE 8.4 % IV SOLN
INTRAVENOUS | Status: DC
  Filled 2021-07-19: qty 1000
  Filled 2021-07-19: qty 150
  Filled 2021-07-19: qty 1000
  Filled 2021-07-19 (×2): qty 150

## 2021-07-19 MED ORDER — DEXTROSE 50 % IV SOLN: 1.0000 | Freq: Once | INTRAVENOUS | Status: DC

## 2021-07-19 MED ORDER — INSULIN ASPART 100 UNIT/ML IV SOLN
5.0000 [IU] | Freq: Once | INTRAVENOUS | Status: AC
  Administered 2021-07-19: 5 [IU] via INTRAVENOUS
  Filled 2021-07-19: qty 0.05

## 2021-07-19 MED ORDER — CALCIUM GLUCONATE-NACL 1-0.675 GM/50ML-% IV SOLN
1.0000 g | Freq: Once | INTRAVENOUS | Status: AC
  Administered 2021-07-19: 1000 mg via INTRAVENOUS
  Filled 2021-07-19: qty 50

## 2021-07-19 MED ORDER — SODIUM BICARBONATE 8.4 % IV SOLN
50.0000 meq | Freq: Once | INTRAVENOUS | Status: AC
  Administered 2021-07-19: 50 meq via INTRAVENOUS
  Filled 2021-07-19: qty 50

## 2021-07-19 MED ORDER — ORAL CARE MOUTH RINSE
15.0000 mL | Freq: Two times a day (BID) | OROMUCOSAL | Status: DC
  Administered 2021-07-20 – 2021-07-23 (×7): 15 mL via OROMUCOSAL

## 2021-07-19 MED ORDER — SODIUM CHLORIDE 0.9 % IV SOLN: INTRAVENOUS | Status: DC | PRN

## 2021-07-19 MED ORDER — SODIUM CHLORIDE 0.9 % IV SOLN
2.0000 g | INTRAVENOUS | Status: DC
  Administered 2021-07-19 – 2021-07-22 (×4): 2 g via INTRAVENOUS
  Filled 2021-07-19: qty 20
  Filled 2021-07-19: qty 2
  Filled 2021-07-19: qty 20
  Filled 2021-07-19 (×2): qty 2

## 2021-07-19 MED ORDER — CHLORHEXIDINE GLUCONATE 0.12 % MT SOLN
15.0000 mL | Freq: Two times a day (BID) | OROMUCOSAL | Status: DC
  Administered 2021-07-19 – 2021-07-24 (×10): 15 mL via OROMUCOSAL
  Filled 2021-07-19 (×7): qty 15

## 2021-07-19 MED ORDER — DEXTROSE 50 % IV SOLN
1.0000 | Freq: Once | INTRAVENOUS | Status: AC
  Administered 2021-07-19: 50 mL via INTRAVENOUS
  Filled 2021-07-19: qty 50

## 2021-07-19 MED ORDER — SODIUM BICARBONATE 8.4 % IV SOLN
50.0000 meq | Freq: Once | INTRAVENOUS | Status: AC
  Administered 2021-07-19: 50 meq via INTRAVENOUS
  Filled 2021-07-19: qty 100

## 2021-07-19 MED ORDER — LACTATED RINGERS IV BOLUS
500.0000 mL | Freq: Once | INTRAVENOUS | Status: AC
  Administered 2021-07-19: 500 mL via INTRAVENOUS

## 2021-07-19 MED ORDER — SODIUM CHLORIDE 0.9 % IV SOLN
10.0000 mL/h | Freq: Once | INTRAVENOUS | Status: AC
Start: 2021-07-19 — End: 2021-07-19
  Administered 2021-07-19: 10 mL/h via INTRAVENOUS

## 2021-07-19 MED ORDER — SODIUM CHLORIDE 0.9 % IR SOLN
3000.0000 mL | Status: DC
  Administered 2021-07-19 – 2021-07-20 (×9): 3000 mL

## 2021-07-19 MED ORDER — INSULIN ASPART 100 UNIT/ML IV SOLN
10.0000 [IU] | Freq: Once | INTRAVENOUS | Status: AC
Start: 2021-07-19 — End: 2021-07-19
  Administered 2021-07-19: 10 [IU] via INTRAVENOUS
  Filled 2021-07-19: qty 0.1

## 2021-07-19 MED ORDER — INSULIN ASPART 100 UNIT/ML IJ SOLN: 10.0000 [IU] | Freq: Once | INTRAMUSCULAR | Status: DC

## 2021-07-19 NOTE — Assessment & Plan Note (Addendum)
With hypotension requiring Levophed in ICU. ?Antihypertensives held.  Home regimen prior to admission was Coreg 25 mg BID and lisinopril 40 mg. ?BPs have improved ?--Resumed Coreg at 12.5 mg BID for now ?-- Continue holding lisinopril at discharge, resume when BP will tolerate ?-- Monitor blood pressure 1-2 times daily ?-- Follow-up with PCP in 1 to 2 weeks ?

## 2021-07-19 NOTE — Progress Notes (Addendum)
S: Called to bedside by nurse, concern for patient respiratory status.  Vital signs are stable, blood pressure soft but not overtly hypotensive, family is concerned because patient seems to be struggling more to breathe.   ? ?O:  ?On exam, patient still able to follow commands, he is certainly breathing heavy/accessory muscle use, but SPO2 is WNL. "O" sign and mild cyanosis to fingers.   ?There was apparent delay in getting blood, so I authorized emergency PRBCs.  ?VBG shows no acidosis/alkalosis, O2 saturation WNL, low pCO2 and Bicarb.  ?Patient's temperature was increased, respiratory rate increased, other vital signs stable ? ?A/P: ?Spoke with ICU, concern for possible patient decompensation and I wanted second opinion, though I do not believe he meets criteria for ICU admission at this point.  ICU will consult once he gets to the floor. ?My suspicion is that the patient is anxious and hyperventilating causing him to blow off his CO2 and further increase respiratory drive.  If acid/base status had been significantly altered, may meet criteria to be intubated but at this point will continue on room air and try to get him to slow his breathing ?Patient's temperature was increased, there was some concern for transfusion reaction so PRBC was temporarily stopped, but patient's increased temperature is not meeting criteria for reaction, family also had some concern for patient feeling cold so he has been under heavy warm blankets for some time.  Continue to monitor VS closely. ?Discussion with family regarding his current status, additional family members are in the room at this time, his other daughter and her husband.  I reiterated that the blood loss, combined with likely ischemic CVA, certainly puts him at risk of decompensation. I am reassured that his vital signs are stable.  Once we are able to get blood transfused, I am hoping his symptoms will improve, I am reluctant to give antianxiety medications which would  decrease his respiratory drive too much, but may consider low-dose IV Ativan and see if this reduces his hyperventilation.  Advised that if he is not improving with blood transfusion, and/or if MRI demonstrates widespread brain damage (which we are unable to treat with tPA given that he is outside the window for treatment as well as because of the anemia) then it would not be reasonable to remain full code and we should transition to DNR because at that point aggressive resuscitation would certainly be futile gesture and cause more harm than benefit. Family is amenable to this.  ?Communicated all the above to RN, will try to get MRI ASAP to help with prognosis ? ? ?ADDENDUM 07/19/21 5:40 PM  ?RN alerted me that O2 now into 80s, patient placed on 10L O2, orders placed for Stepdown unit. Intensivist consult pending. ABG pending.  ? ?ADDENDUM 07/19/21 6:45 PM ?I was present for Dr. Zoila Shutter discussion w/ family to update on prognosis. See his notes for full details. Pt code status transitioned to DNR/DNI, will still try to treat as able but if he decompensates will transition to comfort care  ? ? ?45 mins spent  ? ?

## 2021-07-19 NOTE — ED Provider Notes (Signed)
? ?[facility] ?Provider Note ? ? ? Event Date/Time  ? First MD Initiated Contact with Patient 07/19/21 1048   ?  (approximate) ? ? ?History  ? ?Aphasia ? ? ?HPI ? ?Duane Hill. is a 81 y.o. male with history of hypertension, hyperlipidemia, diabetes who comes in with concerns for aphasia.  Patient reports having blood from his penis since Thursday.  Reports it is a lot of blood.  Denies any pain associated with it.  Reports that last night he went to bed at 9 PM felt his normal self and then woke up this morning prior to arrival with difficulty speaking.  Denies any weakness in arms or legs.  Reports some difficulty in swallowing.  Patient reports diffuse bleeding from his penis since Friday. ? ?  ? ? ?Physical Exam  ? ?Triage Vital Signs: ?ED Triage Vitals  ?Enc Vitals Group  ?   BP 07/19/21 1049 (!) 137/40  ?   Pulse Rate 07/19/21 1049 73  ?   Resp 07/19/21 1049 (!) 21  ?   Temp 07/19/21 1054 (!) 97.5 ?F (36.4 ?C)  ?   Temp Source 07/19/21 1054 Axillary  ?   SpO2 07/19/21 1054 100 %  ?   Weight --   ?   Height --   ?   Head Circumference --   ?   Peak Flow --   ?   Pain Score 07/19/21 1045 0  ?   Pain Loc --   ?   Pain Edu? --   ?   Excl. in [place]? --   ? ? ?Most recent vital signs: ?Vitals:  ? 07/19/21 1055 07/19/21 1058  ?BP: (!) 147/75   ?Pulse:  74  ?Resp: 20 19  ?Temp:    ?SpO2:  100%  ? ? ? ?General: Awake, no distress.  Patient is oriented x4 but has difficulty speaking ?CV:  Good peripheral perfusion.  ?Resp:  Normal effort.  ?Abd:  No distention.  ?Other:  CN nerves are intact other than his slurred speech with NIH stroke scale of 2 ?Patient has a lot of dried blood noted over his abdomen.  He is got some gauze noted in his pants with blood noted from it.  He has no active drainage or blood from his penis at this time.  He is got no abrasions or scrotal bleeding.  He denies any blood in his stool. ? ? ?ED Results / Procedures / Treatments  ? ?Labs ?(all labs ordered are listed,  but only abnormal results are displayed) ?Labs Reviewed  ?CBG MONITORING, ED - Abnormal; Notable for the following components:  ?    Result Value  ? Glucose-Capillary 170 (*)   ? All other components within normal limits  ?RESP PANEL BY RT-PCR (FLU A&B, COVID) [id number]  ?CBC WITH DIFFERENTIAL/PLATELET  ?COMPREHENSIVE METABOLIC PANEL  ?PROTIME-INR  ?APTT  ?TYPE AND SCREEN  ?TROPONIN I (HIGH SENSITIVITY)  ? ? ? ?EKG ? ?My interpretation of EKG: ? ?Normal sinus rate 74 without any ST elevation or T wave inversions except for aVL, normal intervals ? ?RADIOLOGY ?CT head without any evidence of intercranial hemorrhage ?Chest x-ray was negative ? ?PROCEDURES: ? ?Critical Care performed: Yes, see critical care procedure note(s) ? ?.Critical Care ?Performed by: Vanessa Hoffman, MD ?Authorized by: Vanessa Henning, MD  ? ?Critical care provider statement:  ?  Critical care time (minutes):  75 ?  Critical care was time spent personally by me on  the following activities:  Development of treatment plan with patient or surrogate, discussions with consultants, evaluation of patient's response to treatment, examination of patient, ordering and review of laboratory studies, ordering and review of radiographic studies, ordering and performing treatments and interventions, pulse oximetry, re-evaluation of patient's condition and review of old charts ? ? ?MEDICATIONS ORDERED IN ED: ?Medications  ?0.9 %  sodium chloride infusion (has no administration in time range)  ?sodium chloride irrigation 0.9 % 3,000 mL (has no administration in time range)  ?sodium zirconium cyclosilicate (LOKELMA) packet 10 g (10 g Oral Given 07/19/21 1244)  ?calcium gluconate inj 10% (1 g) URGENT USE ONLY! (1 g Intravenous Given 07/19/21 1230)  ?insulin aspart (novoLOG) injection 5 Units (5 Units Intravenous Given 07/19/21 1244)  ?  And  ?dextrose 50 % solution 50 mL (50 mLs Intravenous Given 07/19/21 1238)  ?sodium bicarbonate injection 50 mEq (50 mEq Intravenous Given  07/19/21 1235)  ? ? ? ?IMPRESSION / MDM / ASSESSMENT AND PLAN / ED COURSE  ?I reviewed the triage vital signs and the nursing notes. ?             ?               ? ?Patient comes in with complex situation where he is got severe penile bleeding and is noted to be hypoxic and hypotensive with EMS concerning for anemia but also has sudden onset of slurred speech this morning.  Stroke code was not called given patient is out of the window for tPA.  His NIH stroke scale is not above 6 he is got no weakness with the aphasia therefore unlikely to be LVO.  However will get CT imaging to ensure. ? ?NIH stroke scale calculated probably between a 2 for some dysarthria.  I did discuss the case with neurology Dr. Leonel Ramsay to see if they would want a CTA.  Does concern that patient probably has a stroke but will hold off on CTA until kidney function comes back. ? ?12:08 PM creatinine came back at 6.19 with a creatinine of 6.6 so we will have to hold on CTA and start off with CT scans without contrast.  I was able to turn his oxygen off and he is currently satting normally.  We will get CT chest abdomen pelvis and CT head to further evaluate.  They postvoid bladder scan was only 100s it seems less likely the creatinine elevation is from obstruction but will confirm with CT imaging as well.  Family is now at bedside he does report a remote history of prostate cancer but is not had any bleeding for years.  I was told he was in remission.  Given the hyperkalemia patient will be treated with bicarb, calcium, Lokelma ?I have also consulted nephrology Dr. Holley Raring so they are aware ? ?Hemoglobin is low at 7.6.  Discussed with family given concern for symptomatic anemia getting 1 unit of blood. ?COVID flu negative.  INR slightly elevated. ? ? ?Patient's CT concerning for obstruction with hydronephrosis.  Also concern for possible pneumonia but patient has normal oxygen levels now on the room air.  We will add on procalcitonin but hold  off on antibiotics given not hypotensive and does not seem to be septic from pneumonia ? ?Discussed the case with urology Dr. Bernardo Heater so he is aware going to start continuous bladder irrigation. ? ?Discussed the case with the hospitalist. ? ? ? ? ?The patient is on the cardiac monitor to evaluate for evidence  of arrhythmia and/or significant heart rate changes. ? ? ?FINAL CLINICAL IMPRESSION(S) / ED DIAGNOSES  ? ?Final diagnoses:  ?Symptomatic anemia  ?Acute renal failure, unspecified acute renal failure type ([place])  ?Urinary retention  ?Hyperkalemia  ?Dysarthria  ? ? ? ?Rx / DC Orders  ? ?ED Discharge Orders   ? ? None  ? ?  ? ? ? ?Note:  This document was prepared using Dragon voice recognition software and may include unintentional dictation errors. ?  ?Vanessa Payette, MD ?07/19/21 1358 ? ?

## 2021-07-19 NOTE — Assessment & Plan Note (Addendum)
Covered during admission with sliding scale NovoLog. ?Hemoglobin A1c 6.3%.  Resume home metformin and glipizide at discharge.  Continue to monitor CBGs. ?PCP follow-up in 1 to 2 weeks. ?

## 2021-07-19 NOTE — H&P (Addendum)
? ? ?HISTORY AND PHYSICAL ? ?Patient: Duane Hill. 81 y.o. male ?MRN: [id number] ? ?Today is hospital day 0 after presenting to ED on 07/19/2021 10:40 AM with  ?Chief Complaint  ?Patient presents with  ? Aphasia  ? ? ? ?RECORD REVIEW AND HOSPITAL COURSE: ?Patient presented to ED 07/19/2021 chief complaint of generalized weakness, severe penile bleeding, sudden onset slurred speech difficulty swallowing this morning with last known well the night prior when he went to bed.  Penile bleeding onset was to 4 days ago began worsening and copious bleeding, but no pain, starting 3 days ago.  He has a history of prostate cancer which was treated with radiation and seed placement into the prostate. ?ED course: Patient was noted to be anemic with hemoglobin 7.6, thrombocytopenic with platelets 146, AKI/ARF with creatinine 6.68, hyperkalemic with potassium 6.9, troponin 77, AST 129 and ALT 47, EKG showed no significant abnormality, CT head showed no acute findings, CT abdomen/pelvis/chest showed hydroureteronephrosis.  ED was in consultation with neurology, urology, nephrology.  Patient was started on continuous bladder irrigation, treated for hyperkalemia and repeat potassium was improved to 5.3.,  Ordered 1 unit PRBC which is currently pending as of time of admission.  He was initially hypoxic but improved, taken off O2 via [place]. ? ?Procedures and Significant Results:  ?Continuous bladder irrigation initiated 07/19/21  ?Transfusion 1 unit PRBC for 223 ?CXR 07/19/2021 ?CT head without contrast 07/19/2021: Increased ventricular size with possible mild hydrocephalus, atrophy, atherosclerotic disease noted in carotid arteries ?CT chest/abdomen/pelvis 07/19/2021: Enlarged lymph nodes in chest, hydroureteronephrosis, questionable right lower lung infection ? ?Consultants:  ?Neurology: Dr. Leonel Ramsay ?Urology: Dr. Bernardo Heater ?Nephrology: Dr. Holley Raring  ? ? ? ?SUBJECTIVE:  ?Patient seen and examined at bedside in the ED, he is resting  relatively comfortably.  He is unable to speak in complete sentences, but he appears to understand questions and can nod his head yes/no, see below for neurological exam.  Family confirms the above history as obtained by the emergency room physician. ? ? ? ? ?ASSESSMENT & PLAN ? ?Symptomatic anemia secondary to penile bleed ?Initial Hgb on presentation 07/19/21 was 7.6 ?PRBC initiated by ED ?ED spoke w/ Urology - pt started on continuous bladder irrigation  ?Closely monitor CBC  ? 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 ?Acute renal failure ([place]) ?Likely due to obstructive nephropathy ?Initial Cr on presentation 07/19/21 was 6.68 ?ED spoke w/ Nephrology, Dr Holley Raring - consult order placed for nephrology to follow  ? ?Stroke determined by clinical assessment [facility]) ?Resulting in dysarthria/aphasia, pt able to follow commands, give yes/no signals, has equal grip strength in hands and equal strength in feet to plantar/dorsi-flexion ?Last known well was outside window for tPA, and given active bleed this was contraindicated ?ED spoke w/ Neurology, Dr. Leonel Ramsay - consult order placed for neurology to follow - appreciate recs re: further imaging or other w/u ?CT Head 07/19/21 (+) increased ventricular size, atrophy.  ?Unable to perform CTA d/t AKI, consider MRI (possibly w/ contrast) per neurology recommendations  ?Unable to give anticoagulation/antiplatelets d/t active bleed, anemia, thrombocytopenia  ?Holding statin d/t elevated liver enzymes and NPO status  ? ?Dysarthria ?Likely d/t CVA ?Speech therapy consult ordered  ? ?Type 2 diabetes mellitus with hyperglycemia (HCC) ?Holding home  medications/po meds  ?SSI while inpatient  ? ?Hyperkalemia ?D/t AKI ?ED spoke w/ Nephrology, Dr Holley Raring ?Potassium  improving w/ treatment in ED ?Closely monitor BMP ? ?Hypoxia, improved ?Concern for possible PNA on CT ?Procalcitonin pending  ? ?Hypertension associated with diabetes ([place]) ?Holding po home meds ?Soft BP in ED, ideally would want some permissive HTN ?IV fluids to continue ? ?GERD (gastroesophageal reflux disease) ?No s/s GI bleed per family ?Intolerance to protonix ?Holding po home PPI ? ?Advanced care planning/counseling discussion ?D/w family at bedside - daughter is legal NOK, pt has no advanced directive, daughter reports he has another daughter and a son who live out of state and may not be available to consult on his care  ?Pt able to communicate basic yes/no - I asked him if he would want [place] to perform CPR/defibrillation and to start life support w/ ventilator etc if his heart were to stop and/or he couldn't breathe - he states "yes" ?Given no obvious terminal condition, FULL CODE seems possible to offer some benefit but if his clinical condition does not improve then we will revisit the discussion - family and patient express understanding  ? ? ?ADDENDUM 07/19/21 6:53 PM  ?Pt transferred to Stepdown given change in clinical status - see progress note from myself and consult note from Dr Mortimer Fries  ? ?NSTEMI (non-ST elevated myocardial infarction) ([place]) ?Unable to anticoagulate given bleeding but may be able to initiate heparin if H/H stabilize post blood transfusion ?Poor prognosis given other significant end organ damage  ? ?Acute respiratory failure ?Pt made DNR/DNI  ? ? ? ?VTE Ppx: SCD given active bleed  ?CODE STATUS: FULL --> DNR/DNI ?Admitted from: home ?Expected Dispo: TBD ?Barriers to discharge: unstable medical condition, active treatment, moderate-to-high likelihood of decompensation  ?Family communication: daughter [name]) at bedside w/ her husband and w/ patient's sister  ? ? ? ? ? ? ? ? ? ? ? ? ? ?Past Medical History:  ?Diagnosis Date  ? Arthritis   ? Asthma   ? Inhaler as needed  ? Dupuytren  contracture   ? GERD (gastroesophageal reflux disease)   ? Hyperlipidemia   ? Hypertension   ? Radiation proctitis   ? Renal insufficiency   ? ? ?Past Surgical History:  ?Procedure Laterality Date  ? PROSTATE SURGERY    ? ? ?Family History  ?Problem Relation Age of Onset  ? Heart attack Mother   ? Hypertension Father   ? Stroke Father   ? Cancer Father   ? Diabetes Father   ? Heart attack Father   ? ?Social History:  reports that he quit smoking about 34 years ago. His smoking use included cigarettes. He has never used smokeless tobacco. He reports current alcohol use of about 7.0 standard drinks per week. He reports that he does not use drugs. ? ?Allergies:  ?Allergies  ?Allergen Reactions  ? Protonix [Pantoprazole Sodium] Nausea And Vomiting  ? ? ?(Not in a hospital admission) ? ? ?Results for orders placed or performed during the hospital encounter of 07/19/21 (from the past 48 hour(s))  ?POC CBG, ED     Status: Abnormal  ? Collection Time: 07/19/21 10:50 AM  ?Result Value Ref Range  ? Glucose-Capillary 170 (H) 70 - 99 mg/dL  ?  Comment: Glucose reference range applies only to samples taken after fasting for at least 8 hours.  ?CBC with Differential     Status: Abnormal  ? Collection Time: 07/19/21 10:53 AM  ?Result Value Ref Range  ?  WBC 8.9 4.0 - 10.5 K/uL  ? RBC 2.61 (L) 4.22 - 5.81 MIL/uL  ? Hemoglobin 7.6 (L) 13.0 - 17.0 g/dL  ? HCT 24.3 (L) 39.0 - 52.0 %  ? MCV 93.1 80.0 - 100.0 fL  ? MCH 29.1 26.0 - 34.0 pg  ? MCHC 31.3 30.0 - 36.0 g/dL  ? RDW 15.6 (H) 11.5 - 15.5 %  ? Platelets 146 (L) 150 - 400 K/uL  ? nRBC 0.0 0.0 - 0.2 %  ? Neutrophils Relative % 41 %  ? Neutro Abs 5.5 1.7 - 7.7 K/uL  ? Band Neutrophils 21 %  ? Lymphocytes Relative 28 %  ? Lymphs Abs 2.5 0.7 - 4.0 K/uL  ? Monocytes Relative 2 %  ? Monocytes Absolute 0.2 0.1 - 1.0 K/uL  ? Eosinophils Relative 0 %  ? Eosinophils Absolute 0.0 0.0 - 0.5 K/uL  ? Basophils Relative 0 %  ? Basophils Absolute 0.0 0.0 - 0.1 K/uL  ? WBC Morphology DOHLE BODIES    ?  Comment: MODERATE LEFT SHIFT (>5% METAS AND MYELOS,OCC PRO NOTED) ?TOXIC GRANULATION ?VACUOLATED NEUTROPHILS ?  ? Smear Review MORPHOLOGY UNREMARKABLE   ? Other 2 %  ? Metamyelocytes Relative 4 %  ? Myelocy

## 2021-07-19 NOTE — Assessment & Plan Note (Addendum)
Multifactorial due to obstructive uropathy, rhabdomyolysis, hypotension. ?Creatinine on admission 6.68. ?Renal function improving - Cr 1.35 ?--Nephrology and Urology consulted ?--Foley placed, now off CBI  ?--Avoid nephrotoxic agents ?--Monitor urine output ?--Repeat labs in 1-2 weeks ?--Maintain MAP>65 ?--Renal dose medications ?

## 2021-07-19 NOTE — Assessment & Plan Note (Addendum)
POA, speech has markedly improved.   ?Due to stroke. ?Neurology consulted. ?SLP cleared for dysphagia 3 diet. ?Unable to start aspirin at this time due to thrombocytopenia. ?

## 2021-07-19 NOTE — Plan of Care (Signed)
? ?  Interdisciplinary Goals of Care Family Meeting ? ? ?Date carried out: 07/19/2021 ? ?Location of the meeting: Unit ? ?Member's involved: Physician, Bedside Registered Nurse, and Family Member or next of kin ? ?Durable Power of Tour manager: Daughter Jody ? ?Code status: Full DNR ? ?Disposition: Continue current acute care ? ? ? ? ?GOALS OF CARE DISCUSSION ? ?The Clinical status was relayed to family in detail- ? ?Updated and notified of patients medical condition- ?Patient is having a weak cough and struggling to remove secretions.   ?Patient with increased WOB and using accessory muscles to breathe ?Explained to family course of therapy and the modalities  ? ?Patient with Progressive multiorgan failure with a very high probablity of a very minimal chance of meaningful recovery despite all aggressive and optimal medical therapy.  ? ? ?Family understands the situation. ?Patient with acute MI with severe anemia with acute CVA and severe metabolic acidosis-VERY HIGH CHANCE FOR CARDIAC ARREST AND DEATH TONIGHT ? ? ?The whole Family has consented and agreed to DNR/DNI and would like to proceed with Comfort care measures if patient progressively worsens from this point forward ? ?Family are satisfied with Plan of action and management. All questions answered ? ?Additional CC time 25 mins ? ? ?Corrin Parker, M.D.  ?[facility]  ?Medical Director [place] ?Medical Director [facility] Cardio-Pulmonary Department  ? ? ?

## 2021-07-19 NOTE — Assessment & Plan Note (Addendum)
POA, resolved.  Due to AKI. ?Monitor BMP. ?

## 2021-07-19 NOTE — Assessment & Plan Note (Addendum)
Urology following - recommendations: ?Manually irrigate Foley catheter as needed for clots ?Continue Foley catheter with plans for outpatient voiding trial in 2 weeks ?Outpatient cystoscopy ?

## 2021-07-19 NOTE — Consult Note (Addendum)
? ?NAME:  Duane Novosel., MRN:  [id number], DOB:  10-May-1940, LOS: 0 ?ADMISSION DATE:  07/19/2021 ? ? ?History of Present Illness:  ?Patient presented to ED 07/19/2021 chief complaint of generalized weakness, severe penile bleeding, sudden onset slurred speech difficulty swallowing this morning  ?Signs and symptoms of acute CVA ?Penile bleeding onset was to 4 days ago began worsening and copious bleeding,  ?He has a history of prostate cancer which was treated with radiation and seed placement into the prostate. Urology Consulted, Neurology Consulted ? ?ED course: Patient was noted to be anemic with hemoglobin 7.6, thrombocytopenic with platelets 146, AKI/ARF with creatinine 6.68, hyperkalemic with potassium 6.9, troponin 77, AST 129 and ALT 47, EKG showed no significant abnormality,  ? ?CT head showed no acute findings, CT abdomen/pelvis/chest showed hydroureteronephrosis.   ? ?ED was in consultation with neurology, urology, nephrology.  Patient was started on continuous bladder irrigation, treated for hyperkalemia and repeat potassium was improved to 5.3.,   ? ?Ordered 1 unit PRBC which is currently pending as of time of admission.  He was initially hypoxic but improved, taken off O2 via [place]. ? ?Patient subsequently developed acute resp distress ?Looks very ill ?+multiorgan failure ? ?Consultants:  ?Neurology: Dr. Leonel Ramsay ?Urology: Dr. Bernardo Heater ?Nephrology: Dr. Holley Raring  ? ?  ?Procedures and Significant Results:  ?Continuous bladder irrigation initiated 07/19/21  ?Transfusion 1 unit PRBC for 223 ?CXR 07/19/2021 ?CT head without contrast 07/19/2021: Increased ventricular size with possible mild hydrocephalus, atrophy, atherosclerotic disease noted in carotid arteries ?CT chest/abdomen/pelvis 07/19/2021: Enlarged lymph nodes in chest, hydroureteronephrosis ?  ? ?  ? ?Significant Hospital Events: ?Including procedures, antibiotic start and stop dates in addition to other pertinent events   ?Admitted to SD/ICU for further  assessment, PCCM consulted ? ? ? ? ?Micro Data:  ?COVID/FLU NEG ? ? ?Antimicrobials:  ? ?Antibiotics Given (last 72 hours)   ? ? None  ? ?  ? ? ? ? ?Objective   ?Blood pressure 99/66, pulse 63, temperature (!) 101.4 ?F (38.6 ?C), temperature source Axillary, resp. rate (!) 27, SpO2 100 %. ?   ?   ? ?Intake/Output Summary (Last 24 hours) at 07/19/2021 1814 ?Last data filed at 07/19/2021 1531 ?Gross per 24 hour  ?Intake --  ?Output 5500 ml  ?Net -5500 ml  ? ?There were no vitals filed for this visit. ? ? ? ?REVIEW OF SYSTEMS ? ?PATIENT IS UNABLE TO PROVIDE COMPLETE REVIEW OF SYSTEMS DUE TO SEVERE CRITICAL ILLNESS, INABILITY TO TALK ? ? ?PHYSICAL EXAMINATION: ? ?GENERAL:critically ill appearing, +resp distress ?Dusky and cyanotic ?EYES: Pupils equal, round, reactive to light.  No scleral icterus.  ?MOUTH: Moist mucosal membrane.  ?NECK: Supple.  ?PULMONARY: +rhonchi, +wheezing ?CARDIOVASCULAR: S1 and S2.  No murmurs  ?GASTROINTESTINAL: Soft, nontender, +distended. Positive bowel sounds.  ?MUSCULOSKELETAL: RT chest wall ecchymosis ?NEUROLOGIC: alert, awake, nonverbal ?SKIN:intact,warm,dry ? ? ? ? ?Labs/imaging that I havepersonally reviewed  ?(right click and "Reselect all SmartList Selections" daily)  ? ? ? ?ASSESSMENT AND PLAN ?SYNOPSIS ? ?81 yo white male with signs and symptoms of acute CVA with acute resp distress with severe metabolic acidosis with acute renal failure with post obstructive uropathy with acute penile bleeding  leading to severe anemia and NSTEMI ? ?Severe ACUTE Hypoxic and Hypercapnic Respiratory Failure ?High risk for intubation and high risk for cardiac arrest ? ? ?Probable CARDIAC FAILURE-NSTEMI ?Check ECHO ?-follow up cardiac enzymes as indicated ? ? ?CARDIAC ?ICU monitoring ? ? ?ACUTE KIDNEY INJURY/Renal Failure ?-continue Foley  Catheter-assess need ?-Avoid nephrotoxic agents ?-Follow urine output, BMP ?-Ensure adequate renal perfusion, optimize oxygenation ?-Renal dose medications ?Start bicarb  infusion...will give 5 amps BICARB pushes STAT ? ? ?Intake/Output Summary (Last 24 hours) at 07/19/2021 1814 ?Last data filed at 07/19/2021 1531 ?Gross per 24 hour  ?Intake --  ?Output 5500 ml  ?Net -5500 ml  ? ? ? ?NEUROLOGY ?Acute ISCHEMIC INFARCT IS HIGHLY SUSPECTED ?NEEDS MRI BRAIN-UNSTABLE AT THIS TIME ? ? ?SHOCK due to UTI, hypovolemia ?SOURCE- ?-use vasopressors to keep MAP>65 as needed ?-follow ABG and LA ?-follow up cultures ?-emperic ABX ?-consider stress dose steroids ?-aggressive IV fluid resuscitation ? ? ?INFECTIOUS DISEASE ?-continue antibiotics as prescribed ?-follow up cultures ? ? ?ENDO ?- ICU hypoglycemic\Hyperglycemia protocol ?-check FSBS per protocol ? ? ?GI ?GI PROPHYLAXIS as indicated ? ?NUTRITIONAL STATUS ?DIET-->NPO ?Constipation protocol as indicated ? ? ?ELECTROLYTES ?-follow labs as needed ?-replace as needed ?-pharmacy consultation and following ? ? ?ACUTE ANEMIA- ?TRANSFUSE AS NEEDED ?CONSIDER TRANSFUSION  IF HGB<7 ?DVT PRX with TED/SCD's ONLY ? ?UNABLE TO GIVE ANTIPLATELET AGENTS AND ANTICOAGULATION ? ? ?Best practice (right click and "Reselect all SmartList Selections" daily)  ?Diet: NPO ?DVT prophylaxis: Contraindicated ?Foley:  Yes, and it is still needed ?Mobility:  bed rest  ?Code Status:  FULL ?Disposition:ICU ? ?Labs   ?CBC: ?Recent Labs  ?Lab 07/19/21 ?1053  ?WBC 8.9  ?NEUTROABS 5.5  ?HGB 7.6*  ?HCT 24.3*  ?MCV 93.1  ?PLT 146*  ? ? ?Basic Metabolic Panel: ?Recent Labs  ?Lab 07/19/21 ?1053 07/19/21 ?1319 07/19/21 ?1541  ?NA 134*  --   --   ?K 6.9* 5.3* 5.6*  ?CL 103  --   --   ?CO2 10*  --   --   ?GLUCOSE 192*  --   --   ?BUN 71*  --   --   ?CREATININE 6.68*  --   --   ?CALCIUM 7.5*  --   --   ? ?GFR: ?CrCl cannot be calculated (Unknown ideal weight.). ?Recent Labs  ?Lab 07/19/21 ?1053 07/19/21 ?1541  ?PROCALCITON  --  >150.00  ?WBC 8.9  --   ? ? ?Liver Function Tests: ?Recent Labs  ?Lab 07/19/21 ?1053  ?AST 129*  ?ALT 47*  ?ALKPHOS 41  ?BILITOT 1.0  ?PROT 5.8*  ?ALBUMIN 3.3*   ? ?No results for input(s): LIPASE, AMYLASE in the last 168 hours. ?No results for input(s): AMMONIA in the last 168 hours. ? ?ABG ?   ?Component Value Date/Time  ? HCO3 14.6 (L) 07/19/2021 1653  ? ACIDBASEDEF 10.5 (H) 07/19/2021 1653  ? O2SAT 43.6 07/19/2021 1653  ?  ? ?Coagulation Profile: ?Recent Labs  ?Lab 07/19/21 ?1053  ?INR 2.0*  ? ? ?Cardiac Enzymes: ?No results for input(s): CKTOTAL, CKMB, CKMBINDEX, TROPONINI in the last 168 hours. ? ?HbA1C: ?Hemoglobin A1C  ?Date/Time Value Ref Range Status  ?08/15/2014 02:56 AM 6.7 (H) % Final  ?  Comment:  ?  4.0-6.0 ?NOTE: New Reference Range ? 06/25/14 ?  ? ?HB A1C (BAYER DCA - WAIVED)  ?Date/Time Value Ref Range Status  ?04/28/2020 02:26 PM 7.2 (H) <7.0 % Final  ?  Comment:  ?                                        Diabetic Adult            <7.0 ?  Healthy Adult        4.3 - 5.7 ?                                                          (DCCT/NGSP) ?American Diabetes Association's Summary of Glycemic Recommendations ?for Adults with Diabetes: Hemoglobin A1c <7.0%. More stringent ?glycemic goals (A1c <6.0%) may further reduce complications at the ?cost of increased risk of hypoglycemia. ?  ?10/01/2019 03:48 PM 5.8 <7.0 % Final  ?  Comment:  ?                                        Diabetic Adult            <7.0 ?                                      Healthy Adult        4.3 - 5.7 ?                                                          (DCCT/NGSP) ?American Diabetes Association's Summary of Glycemic Recommendations ?for Adults with Diabetes: Hemoglobin A1c <7.0%. More stringent ?glycemic goals (A1c <6.0%) may further reduce complications at the ?cost of increased risk of hypoglycemia. ?  ? ?Hgb A1c MFr Bld  ?Date/Time Value Ref Range Status  ?07/01/2021 01:57 PM 6.3 (H) 4.8 - 5.6 % Final  ?  Comment:  ?           Prediabetes: 5.7 - 6.4 ?         Diabetes: >6.4 ?         Glycemic control for adults with diabetes: <7.0 ?   ?01/05/2021 03:33 PM 6.9 (H) 4.8 - 5.6 % Final  ?  Comment:  ?           Prediabetes: 5.7 - 6.4 ?         Diabetes: >6.4 ?         Glycemic control for adults with diabetes: <7.0 ?  ? ? ?CBG: ?Recent Labs  ?Lab 04/02

## 2021-07-19 NOTE — ED Notes (Signed)
Daughter [name] (Next of Norris) [phone] ?

## 2021-07-19 NOTE — ED Notes (Signed)
This RN called blood bank to see what the delay was in sending one unit of blood. They advised they ordered a second collection tube but it has not been sent down yet. They will not issue blood until this tube is collected. MD will be made aware.  ?

## 2021-07-19 NOTE — Assessment & Plan Note (Addendum)
Type 2 non-stemi due to demand ischemia, not ACS.  No chest pain or acute ECG changes. ?Cardiology consulted. ?No inpatient cardiac diagnostics recommended. ?

## 2021-07-19 NOTE — ED Notes (Signed)
RN contacted MD about pt neuro change. Pt is not nodding his head or moaning "yes" or "No" to staff anymore. Pt is breathing heavy and just looking around the room. Pt VS stable at this time. Per MD, she will be down to reassess pt. ?

## 2021-07-19 NOTE — Assessment & Plan Note (Addendum)
POA, due to hematuria.    ?Hgb on presentation 07/19/21 was 7.6 ?PRBC transfusion initiated by ED. ?Hemoglobin stable at 9.3.   ?Urine remains pink-tinged but hemoglobin has been stable and improving for several days. ?--Monitor CBC at follow-up ?--Transfuse for Hbg>7 or hemodynamic instability with active bleeding ?

## 2021-07-19 NOTE — Assessment & Plan Note (Addendum)
Presented with a right-sided hemiparesis and dysarthria.  Possibly symptomatic hypoperfusion secondary to hypotension, anemia and sepsis.  Deficits improving. ? ?STROKE RULED OUT. ?No acute infarct seen on MRI.   ?Severe left PCA stenosis may be culprit lesion (but did not have visual symptoms initially).  ? ?-- Start ASA when thrombocytopenia is better ?--Neurology consulted, signed off ?

## 2021-07-19 NOTE — Consult Note (Signed)
? ?Urology Consult ? ?Requesting physician: Emeterio Reeve, DO ? ?Reason for consultation: Hematuria, history of prostate cancer ? ? ?History of Present Illness: Duane Hill. is a 81 y.o. male who presented to the ED this morning with penile bleeding since 07/16/2021.  Denies flank, abdominal or pelvic pain. ? ?CT chest/abdomen/pelvis showed a distended bladder with mild, bilateral hydronephrosis..  Creatinine was 6.68 (1.69 on 07/01/2021).  Hemoglobin was 7.6. ? ?A 20 French three-way Foley catheter had been placed by the ED staff and he was started on continuous bladder irrigation. ? ?Prior history of prostate cancer status post IMRT with brachytherapy in 2013.  Was hospitalized April 2016 for gross hematuria requiring CBI.  CT urogram showed prostate enlargement with brachytherapy and no upper tract abnormalities.  Cystoscopy performed July 2016 showed prostate enlargement with questionable lesion right bladder neck.  Follow-up cystoscopy December 2016 showed no papillary lesions and findings felt consistent with radiation cystitis.  He has had no urology follow-up since that time. ?Last PSA in 2017 was < 0.1 ? ?When he awoke this morning he was complaining of right-sided weakness and noted to be dysarthric and has been seen by neurology. ? ?Past Medical History:  ?Diagnosis Date  ? Arthritis   ? Asthma   ? Inhaler as needed  ? Dupuytren contracture   ? GERD (gastroesophageal reflux disease)   ? Hyperlipidemia   ? Hypertension   ? Radiation proctitis   ? Renal insufficiency   ? ? ?Past Surgical History:  ?Procedure Laterality Date  ? PROSTATE SURGERY    ? ? ?Home Medications:  ?Current Meds  ?Medication Sig  ? atorvastatin (LIPITOR) 20 MG tablet Take 1 tablet (20 mg total) by mouth daily.  ? carvedilol (COREG) 25 MG tablet Take 1 tablet (25 mg total) by mouth 2 (two) times daily with a meal.  ? Choline Fenofibrate (FENOFIBRIC ACID) 135 MG CPDR Take 1 capsule by mouth daily.  ? fluticasone (FLONASE) 50  MCG/ACT nasal spray Place 1 spray into both nostrils 2 (two) times daily.  ? gabapentin (NEURONTIN) 300 MG capsule Take 2 capsules (600 mg total) by mouth 2 (two) times daily.  ? glipiZIDE (GLUCOTROL) 5 MG tablet Take 5 mg by mouth 2 (two) times daily before a meal.  ? metFORMIN (GLUCOPHAGE-XR) 500 MG 24 hr tablet Take 1 tablet (500 mg total) by mouth in the morning and at bedtime.  ? omeprazole (PRILOSEC) 40 MG capsule Take 1 capsule (40 mg total) by mouth daily.  ? vitamin B-12 (CYANOCOBALAMIN) 1000 MCG tablet Take 1 tablet (1,000 mcg total) by mouth daily.  ? ? ?Allergies:  ?Allergies  ?Allergen Reactions  ? Protonix [Pantoprazole Sodium] Nausea And Vomiting  ? ? ?Family History  ?Problem Relation Age of Onset  ? Heart attack Mother   ? Hypertension Father   ? Stroke Father   ? Cancer Father   ? Diabetes Father   ? Heart attack Father   ? ? ?Social History:  reports that he quit smoking about 34 years ago. His smoking use included cigarettes. He has never used smokeless tobacco. He reports current alcohol use of about 7.0 standard drinks per week. He reports that he does not use drugs. ? ?ROS: ?A complete review of systems was performed.  All systems are negative except for pertinent findings as noted. ? ?Physical Exam:  ?Vital signs in last 24 hours: ?Temp:  [97.5 ?F (36.4 ?C)] 97.5 ?F (36.4 ?C) (04/02 1054) ?Pulse Rate:  [72-102] 87 (04/02 1500) ?  Resp:  [15-34] 31 (04/02 1500) ?BP: (123-150)/(13-98) 130/93 (04/02 1500) ?SpO2:  [97 %-100 %] 100 % (04/02 1500) ?Constitutional:  Alert, no acute distress ?HEENT: [place] AT, moist mucus membranes.  Trachea midline, no masses ?Cardiovascular: Regular rate and rhythm, no clubbing, cyanosis, or edema. ?Respiratory: Normal respiratory effort, lungs clear bilaterally ?GI: Abdomen is soft, nontender, nondistended, no abdominal masses ?GU: Three-way Foley catheter in place with clear effluent on brisk CBI.  The catheter was irrigated and no clots were obtained.  CBI rate was  decreased with pink-tinged effluent. ?Skin: No rashes, bruises or suspicious lesions ?Lymph: No cervical or inguinal adenopathy ?Neurologic: Dysarthric ? ? ?Laboratory Data:  ?Recent Labs  ?  07/19/21 ?9935  ?WBC 8.9  ?HGB 7.6*  ?HCT 24.3*  ? ?Recent Labs  ?  07/19/21 ?1053 07/19/21 ?1319  ?NA 134*  --   ?K 6.9* 5.3*  ?CL 103  --   ?CO2 10*  --   ?GLUCOSE 192*  --   ?BUN 71*  --   ?CREATININE 6.68*  --   ?CALCIUM 7.5*  --   ? ?Recent Labs  ?  07/19/21 ?1053  ?INR 2.0*  ? ?No results for input(s): LABURIN in the last 72 hours. ?Results for orders placed or performed during the hospital encounter of 07/19/21  ?Resp Panel by RT-PCR (Flu A&B, Covid) Nasopharyngeal Swab     Status: None  ? Collection Time: 07/19/21 10:54 AM  ? Specimen: Nasopharyngeal Swab; Nasopharyngeal(NP) swabs in vial transport medium  ?Result Value Ref Range Status  ? SARS Coronavirus 2 by RT PCR NEGATIVE NEGATIVE Final  ?  Comment: (NOTE) ?SARS-CoV-2 target nucleic acids are NOT DETECTED. ? ?The SARS-CoV-2 RNA is generally detectable in upper respiratory ?specimens during the acute phase of infection. The lowest ?concentration of SARS-CoV-2 viral copies this assay can detect is ?138 copies/mL. A negative result does not preclude SARS-Cov-2 ?infection and should not be used as the sole basis for treatment or ?other patient management decisions. A negative result may occur with  ?improper specimen collection/handling, submission of specimen other ?than nasopharyngeal swab, presence of viral mutation(s) within the ?areas targeted by this assay, and inadequate number of viral ?copies(<138 copies/mL). A negative result must be combined with ?clinical observations, patient history, and epidemiological ?information. The expected result is Negative. ? ?Fact Sheet for Patients:  ?EntrepreneurPulse.com.au ? ?Fact Sheet for Healthcare Providers:  ?IncredibleEmployment.be ? ?This test is no t yet approved or cleared by the  [place] FDA and  ?has been authorized for detection and/or diagnosis of SARS-CoV-2 by ?FDA under an Emergency Use Authorization (EUA). This EUA will remain  ?in effect (meaning this test can be used) for the duration of the ?COVID-19 declaration under Section 564(b)(1) of the Act, 21 ?U.S.C.section 360bbb-3(b)(1), unless the authorization is terminated  ?or revoked sooner.  ? ? ?  ? Influenza A by PCR NEGATIVE NEGATIVE Final  ? Influenza B by PCR NEGATIVE NEGATIVE Final  ?  Comment: (NOTE) ?The Xpert Xpress SARS-CoV-2/FLU/RSV plus assay is intended as an aid ?in the diagnosis of influenza from Nasopharyngeal swab specimens and ?should not be used as a sole basis for treatment. Nasal washings and ?aspirates are unacceptable for Xpert Xpress SARS-CoV-2/FLU/RSV ?testing. ? ?Fact Sheet for Patients: ?EntrepreneurPulse.com.au ? ?Fact Sheet for Healthcare Providers: ?IncredibleEmployment.be ? ?This test is not yet approved or cleared by the [place] FDA and ?has been authorized for detection and/or diagnosis of SARS-CoV-2 by ?FDA under an Emergency Use Authorization (EUA). This EUA will  remain ?in effect (meaning this test can be used) for the duration of the ?COVID-19 declaration under Section 564(b)(1) of the Act, 21 U.S.C. ?section 360bbb-3(b)(1), unless the authorization is terminated or ?revoked. ? ?Performed at [facility], [place], ?[place] [address] ?  ? ? ? ?Radiologic Imaging: ?Images of CT abdomen/pelvis were personally reviewed and interpreted.  Bladder is distended.  No hyperdense material noted that would be suggestive of clot.  Mild, bilateral hydronephrosis ? ?CT HEAD WO CONTRAST (5MM) ? ?Result Date: 07/19/2021 ?CLINICAL DATA:  81 year old male with seizure and slurred speech. EXAM: CT HEAD WITHOUT CONTRAST TECHNIQUE: Contiguous axial images were obtained from the base of the skull through the vertex without intravenous contrast.  RADIATION DOSE REDUCTION: This exam was performed according to the departmental dose-optimization program which includes automated exposure control, adjustment of the mA and/or kV according to patient size and/

## 2021-07-19 NOTE — ED Notes (Signed)
Provider at bedside

## 2021-07-19 NOTE — Progress Notes (Signed)
?   07/19/21 2000  ?Clinical Encounter Type  ?Visited With Patient and family together  ?Visit Type Initial  ?Referral From Nurse  ?Consult/Referral To Chaplain  ? ?Chaplain responded to nurse page. Chaplain provided compassionate presence and reflective listening as patient is possibly moving to comfort care. Chaplain provided prayer as requested. Patient and family appreciated Chaplain visit. ?

## 2021-07-19 NOTE — Assessment & Plan Note (Addendum)
See above

## 2021-07-19 NOTE — Consult Note (Signed)
Neurology Consultation ?Reason for Consult: Right-sided weakness ?Referring Physician: Renetta Chalk ? ?CC: right sided wekaness ? ?History is obtained from:patient ? ?HPI: Duane Hill. is a 81 y.o. male with a history of hypertension, hyperlipidemia who presents with right-sided weakness that was present on awakening.  He went to bed feeling normal last night and subsequently found himself to be weak and dysarthric on awakening.  He denies any difficulty understanding people, and states that it is not that he is having difficulty getting his words out, just that they are slurred when they come out. ? ?He also reports that he has been bleeding. ? ?LKW: 9 PM ?tpa given?: no, outside of window ? ?ROS: A 14 point ROS was performed and is negative except as noted in the HPI.  ?Past Medical History:  ?Diagnosis Date  ? Arthritis   ? Asthma   ? Inhaler as needed  ? Dupuytren contracture   ? GERD (gastroesophageal reflux disease)   ? Hyperlipidemia   ? Hypertension   ? Radiation proctitis   ? Renal insufficiency   ? ? ? ?Family History  ?Problem Relation Age of Onset  ? Heart attack Mother   ? Hypertension Father   ? Stroke Father   ? Cancer Father   ? Diabetes Father   ? Heart attack Father   ? ? ? ?Social History:  reports that he quit smoking about 34 years ago. His smoking use included cigarettes. He has never used smokeless tobacco. He reports current alcohol use of about 7.0 standard drinks per week. He reports that he does not use drugs. ? ? ?Exam: ?Current vital signs: ?BP (!) 130/93   Pulse 87   Temp (!) 97.5 ?F (36.4 ?C) (Axillary)   Resp (!) 31   SpO2 100%  ?Vital signs in last 24 hours: ?Temp:  [97.5 ?F (36.4 ?C)] 97.5 ?F (36.4 ?C) (04/02 1054) ?Pulse Rate:  [72-102] 87 (04/02 1500) ?Resp:  [15-34] 31 (04/02 1500) ?BP: (123-150)/(13-98) 130/93 (04/02 1500) ?SpO2:  [97 %-100 %] 100 % (04/02 1500) ? ? ?Physical Exam  ?Constitutional: Appears well-developed and well-nourished.  ?Psych: Affect appropriate to  situation ?Eyes: No scleral injection ?HENT: No OP obstruction ?MSK: no joint deformities.  ?Cardiovascular: Normal rate and regular rhythm.  ?Respiratory: Effort normal, non-labored breathing ?GI: Soft.  No distension. There is no tenderness.  ?Skin: WDI ? ?Neuro: ?Mental Status: ?Patient is awake, alert, oriented to person, place, month, year, and situation. ?Patient is able to give a clear and coherent history. ?He is able to name and repeat, no clear aphasia just very dysarthric. ?Cranial Nerves: ?II: Visual Fields are full. Pupils are equal, round, and reactive to light.   ?III,IV, VI: EOMI without ptosis or diploplia.  ?V: Facial sensation is symmetric to temperature ?VII: Facial movement with right facial weakness ?VIII: hearing is intact to voice ?X: Uvula elevates symmetrically ?XI: Shoulder shrug is symmetric. ?XII: tongue is midline without atrophy or fasciculations.  ?Motor: ?Tone is normal. Bulk is normal. 5/5 strength was present on the left, he has 4 -/5 weakness of the right arm and 4/5 weakness of the right leg. ?Sensory: ?Sensation is symmetric to light touch and temperature in the arms and legs. ?Cerebellar: ?FNF and HKS intact on the left ? ? ? ? ? ?I have reviewed labs in epic and the results pertinent to this consultation are: ?Creatinine 6.68, potassium 6.9, sodium 134 ?Hemoglobin 7.6 ? ?I have reviewed the images obtained: CT head-unremarkable ? ?Impression: 81 year old  male with pure motor hemiparesis.  With no signs of LVO, does not meet criteria for code stroke, and with it being a pure motor syndrome, I have low suspicion for LVO.  He will need further evaluation for stroke, as I strongly suspect this does represent ischemic infarct. ? ?Typically I would recommend antiplatelet therapy, but given his report of bleeding as well as hemoglobin of 7.6, I think holding this for now would be reasonable. ? ?Recommendations: ?- HgbA1c, fasting lipid panel ?- MRI of the brain without contrast ?-  Frequent neuro checks ?- Echocardiogram ?- MRA head and neck ?- Prophylactic therapy-none for now given concern for bleeding ?- Risk factor modification ?- Telemetry monitoring ?- PT consult, OT consult, Speech consult ?- Stroke team to follow ? ? ? ?Roland Rack, MD ?[facility] ?[phone] ? ?If 7pm- 7am, please page neurology on call as listed in [place]. ? ?

## 2021-07-19 NOTE — Progress Notes (Signed)
Pt arrived to unit alert and unable to speak possibly due to SOB. Nods and gestures appropriately. Denies pain. Symmetrical weak grips bilaterally. Pupils equal, round. O2 sats de-sating to 70s and pt placed on heated HFNC. CBI intact and pt foley draining pale pink fluid. Made DNR/DNI. Pt is comfortable at this time. Family at bedside. ?

## 2021-07-19 NOTE — ED Triage Notes (Signed)
Pt arrives via EMS from home for slurred speech- pt states he was fine when he went to sleep last night around 2100 and woke up with the slurred speech- pt has been having profuse bleeding from his penis since Friday- pt is A&Ox4 but speech is still considerably slurred ?

## 2021-07-19 NOTE — ED Notes (Signed)
Family came and got nurse. Pt began to breath "differently" more sound upon respirations and is now not talking to family like he was before. MD made aware to come to bedside.  ?

## 2021-07-19 NOTE — Plan of Care (Signed)
Patient admitted to CCU today. Now on BiPAP, CBI. Family at bedside. ? ? ?Problem: Education: ?Goal: Knowledge of General Education information will improve ?Description: Including pain rating scale, medication(s)/side effects and non-pharmacologic comfort measures ?Outcome: Progressing ?  ?Problem: Health Behavior/Discharge Planning: ?Goal: Ability to manage health-related needs will improve ?Outcome: Progressing ?  ?Problem: Clinical Measurements: ?Goal: Ability to maintain clinical measurements within normal limits will improve ?Outcome: Progressing ?Goal: Will remain free from infection ?Outcome: Progressing ?Goal: Diagnostic test results will improve ?Outcome: Progressing ?Goal: Respiratory complications will improve ?Outcome: Progressing ?Goal: Cardiovascular complication will be avoided ?Outcome: Progressing ?  ?Problem: Activity: ?Goal: Risk for activity intolerance will decrease ?Outcome: Progressing ?  ?Problem: Nutrition: ?Goal: Adequate nutrition will be maintained ?Outcome: Progressing ?  ?Problem: Coping: ?Goal: Level of anxiety will decrease ?Outcome: Progressing ?  ?Problem: Elimination: ?Goal: Will not experience complications related to bowel motility ?Outcome: Progressing ?Goal: Will not experience complications related to urinary retention ?Outcome: Progressing ?  ?Problem: Pain Managment: ?Goal: General experience of comfort will improve ?Outcome: Progressing ?  ?Problem: Safety: ?Goal: Ability to remain free from injury will improve ?Outcome: Progressing ?  ?Problem: Skin Integrity: ?Goal: Risk for impaired skin integrity will decrease ?Outcome: Progressing ?  ?

## 2021-07-19 NOTE — Assessment & Plan Note (Addendum)
Continue omeprazole. ?Intolerance to Protonix. ?

## 2021-07-19 NOTE — Assessment & Plan Note (Addendum)
Palliative care consulted. ?Code status is DNR. ?Otherwise full scope of care at this time. ?

## 2021-07-20 ENCOUNTER — Inpatient Hospital Stay: Payer: Medicare Other

## 2021-07-20 ENCOUNTER — Inpatient Hospital Stay
Admit: 2021-07-20 | Discharge: 2021-07-20 | Disposition: A | Discharge status: Home / Self Care | Payer: Medicare Other | Attending: Student | Admitting: Student

## 2021-07-20 DIAGNOSIS — G8191 Hemiplegia, unspecified affecting right dominant side: Secondary | ICD-10-CM

## 2021-07-20 DIAGNOSIS — L8990 Pressure ulcer of unspecified site, unspecified stage: Secondary | ICD-10-CM | POA: Insufficient documentation

## 2021-07-20 DIAGNOSIS — R310 Gross hematuria: Secondary | ICD-10-CM

## 2021-07-20 DIAGNOSIS — R339 Retention of urine, unspecified: Secondary | ICD-10-CM | POA: Diagnosis not present

## 2021-07-20 DIAGNOSIS — D649 Anemia, unspecified: Secondary | ICD-10-CM | POA: Diagnosis not present

## 2021-07-20 LAB — HEPATIC FUNCTION PANEL
ALT: 713 U/L — ABNORMAL HIGH (ref 0–44)
AST: 1457 U/L — ABNORMAL HIGH (ref 15–41)
Albumin: 2.7 g/dL — ABNORMAL LOW (ref 3.5–5.0)
Alkaline Phosphatase: 37 U/L — ABNORMAL LOW (ref 38–126)
Bilirubin, Direct: 0.4 mg/dL — ABNORMAL HIGH (ref 0.0–0.2)
Indirect Bilirubin: 0.4 mg/dL (ref 0.3–0.9)
Total Bilirubin: 0.8 mg/dL (ref 0.3–1.2)
Total Protein: 5.2 g/dL — ABNORMAL LOW (ref 6.5–8.1)

## 2021-07-20 LAB — FIBRINOGEN: Fibrinogen: 779 mg/dL — ABNORMAL HIGH (ref 210–475)

## 2021-07-20 LAB — COMPREHENSIVE METABOLIC PANEL
ALT: 11 U/L (ref 0–44)
AST: 1438 U/L — ABNORMAL HIGH (ref 15–41)
Albumin: 2.5 g/dL — ABNORMAL LOW (ref 3.5–5.0)
Alkaline Phosphatase: 37 U/L — ABNORMAL LOW (ref 38–126)
Anion gap: 17 — ABNORMAL HIGH (ref 5–15)
BUN: 81 mg/dL — ABNORMAL HIGH (ref 8–23)
CO2: 34 mmol/L — ABNORMAL HIGH (ref 22–32)
Calcium: 6.6 mg/dL — ABNORMAL LOW (ref 8.9–10.3)
Chloride: 91 mmol/L — ABNORMAL LOW (ref 98–111)
Creatinine, Ser: 5.11 mg/dL — ABNORMAL HIGH (ref 0.61–1.24)
GFR, Estimated: 11 mL/min — ABNORMAL LOW (ref >60)
Glucose, Bld: 260 mg/dL — ABNORMAL HIGH (ref 70–99)
Potassium: 3.2 mmol/L — ABNORMAL LOW (ref 3.5–5.1)
Sodium: 142 mmol/L (ref 135–145)
Total Bilirubin: 0.9 mg/dL (ref 0.3–1.2)
Total Protein: 5.2 g/dL — ABNORMAL LOW (ref 6.5–8.1)

## 2021-07-20 LAB — BLOOD GAS, VENOUS
Acid-Base Excess: 14.5 mmol/L — ABNORMAL HIGH (ref 0.0–2.0)
Bicarbonate: 37.3 mmol/L — ABNORMAL HIGH (ref 20.0–28.0)
FIO2: 45 %
O2 Content: 45 L/min
O2 Saturation: 93.2 %
Patient temperature: 37
pCO2, Ven: 38 mmHg — ABNORMAL LOW (ref 44–60)
pH, Ven: 7.6 — ABNORMAL HIGH (ref 7.25–7.43)
pO2, Ven: 61 mmHg — ABNORMAL HIGH (ref 32–45)

## 2021-07-20 LAB — BASIC METABOLIC PANEL
Anion gap: 12 (ref 5–15)
Anion gap: 19 — ABNORMAL HIGH (ref 5–15)
BUN: 79 mg/dL — ABNORMAL HIGH (ref 8–23)
BUN: 77 mg/dL — ABNORMAL HIGH (ref 8–23)
CO2: 33 mmol/L — ABNORMAL HIGH (ref 22–32)
CO2: 28 mmol/L (ref 22–32)
Calcium: 6.2 mg/dL — CL (ref 8.9–10.3)
Calcium: 6.4 mg/dL — CL (ref 8.9–10.3)
Chloride: 96 mmol/L — ABNORMAL LOW (ref 98–111)
Chloride: 94 mmol/L — ABNORMAL LOW (ref 98–111)
Creatinine, Ser: 4.44 mg/dL — ABNORMAL HIGH (ref 0.61–1.24)
Creatinine, Ser: 4.07 mg/dL — ABNORMAL HIGH (ref 0.61–1.24)
GFR, Estimated: 13 mL/min — ABNORMAL LOW (ref >60)
GFR, Estimated: 14 mL/min — ABNORMAL LOW (ref >60)
Glucose, Bld: 138 mg/dL — ABNORMAL HIGH (ref 70–99)
Glucose, Bld: 145 mg/dL — ABNORMAL HIGH (ref 70–99)
Potassium: 3.1 mmol/L — ABNORMAL LOW (ref 3.5–5.1)
Potassium: 3.1 mmol/L — ABNORMAL LOW (ref 3.5–5.1)
Sodium: 141 mmol/L (ref 135–145)
Sodium: 141 mmol/L (ref 135–145)

## 2021-07-20 LAB — POTASSIUM
Potassium: 4.2 mmol/L (ref 3.5–5.1)
Potassium: 3 mmol/L — ABNORMAL LOW (ref 3.5–5.1)
Potassium: 3.1 mmol/L — ABNORMAL LOW (ref 3.5–5.1)

## 2021-07-20 LAB — CBC
HCT: 28.5 % — ABNORMAL LOW (ref 39.0–52.0)
Hemoglobin: 9.8 g/dL — ABNORMAL LOW (ref 13.0–17.0)
MCH: 29.3 pg (ref 26.0–34.0)
MCHC: 34.4 g/dL (ref 30.0–36.0)
MCV: 85.3 fL (ref 80.0–100.0)
Platelets: 86 10*3/uL — ABNORMAL LOW (ref 150–400)
RBC: 3.34 MIL/uL — ABNORMAL LOW (ref 4.22–5.81)
RDW: 14.7 % (ref 11.5–15.5)
WBC: 2.7 10*3/uL — ABNORMAL LOW (ref 4.0–10.5)
nRBC: 0 % (ref 0.0–0.2)

## 2021-07-20 LAB — HEMOGLOBIN AND HEMATOCRIT, BLOOD
HCT: 27.4 % — ABNORMAL LOW (ref 39.0–52.0)
HCT: 28 % — ABNORMAL LOW (ref 39.0–52.0)
Hemoglobin: 9.5 g/dL — ABNORMAL LOW (ref 13.0–17.0)
Hemoglobin: 9.6 g/dL — ABNORMAL LOW (ref 13.0–17.0)

## 2021-07-20 LAB — GLUCOSE, CAPILLARY
Glucose-Capillary: 259 mg/dL — ABNORMAL HIGH (ref 70–99)
Glucose-Capillary: 266 mg/dL — ABNORMAL HIGH (ref 70–99)
Glucose-Capillary: 159 mg/dL — ABNORMAL HIGH (ref 70–99)
Glucose-Capillary: 150 mg/dL — ABNORMAL HIGH (ref 70–99)
Glucose-Capillary: 140 mg/dL — ABNORMAL HIGH (ref 70–99)
Glucose-Capillary: 146 mg/dL — ABNORMAL HIGH (ref 70–99)

## 2021-07-20 LAB — ACETAMINOPHEN LEVEL: Acetaminophen (Tylenol), Serum: <10 ug/mL — ABNORMAL LOW (ref 10–30)

## 2021-07-20 LAB — CK: Total CK: 6284 U/L — ABNORMAL HIGH (ref 49–397)

## 2021-07-20 LAB — LACTIC ACID, PLASMA: Lactic Acid, Venous: 3.1 mmol/L (ref 0.5–1.9)

## 2021-07-20 LAB — TROPONIN I (HIGH SENSITIVITY)
Troponin I (High Sensitivity): 4589 ng/L (ref <18)
Troponin I (High Sensitivity): 1853 ng/L (ref <18)
Troponin I (High Sensitivity): 1475 ng/L (ref <18)

## 2021-07-20 LAB — PREPARE RBC (CROSSMATCH)

## 2021-07-20 LAB — PROCALCITONIN: Procalcitonin: >150 ng/mL

## 2021-07-20 LAB — LIPASE, BLOOD: Lipase: 16 U/L (ref 11–51)

## 2021-07-20 LAB — MAGNESIUM: Magnesium: 2.3 mg/dL (ref 1.7–2.4)

## 2021-07-20 LAB — AMMONIA: Ammonia: 28 umol/L (ref 9–35)

## 2021-07-20 LAB — PATHOLOGIST SMEAR REVIEW

## 2021-07-20 LAB — PROTIME-INR
INR: 2 — ABNORMAL HIGH (ref 0.8–1.2)
Prothrombin Time: 23.1 seconds — ABNORMAL HIGH (ref 11.4–15.2)

## 2021-07-20 LAB — PHOSPHORUS: Phosphorus: 6.4 mg/dL — ABNORMAL HIGH (ref 2.5–4.6)

## 2021-07-20 MED ORDER — NOREPINEPHRINE 4 MG/250ML-% IV SOLN
2.0000 ug/min | INTRAVENOUS | Status: DC
  Administered 2021-07-20: 2 ug/min via INTRAVENOUS
  Administered 2021-07-21: 3 ug/min via INTRAVENOUS
  Filled 2021-07-20 (×2): qty 250

## 2021-07-20 MED ORDER — INSULIN DETEMIR 100 UNIT/ML ~~LOC~~ SOLN
7.0000 [IU] | Freq: Two times a day (BID) | SUBCUTANEOUS | Status: DC
  Administered 2021-07-20 – 2021-07-24 (×8): 7 [IU] via SUBCUTANEOUS
  Filled 2021-07-20 (×9): qty 0.07

## 2021-07-20 MED ORDER — LACTATED RINGERS IV BOLUS
500.0000 mL | Freq: Once | INTRAVENOUS | Status: AC
  Administered 2021-07-20: 500 mL via INTRAVENOUS

## 2021-07-20 MED ORDER — POTASSIUM CHLORIDE 10 MEQ/100ML IV SOLN
10.0000 meq | INTRAVENOUS | Status: AC
  Administered 2021-07-20 (×3): 10 meq via INTRAVENOUS
  Filled 2021-07-20 (×4): qty 100

## 2021-07-20 MED ORDER — SODIUM CHLORIDE 0.9% IV SOLUTION: Freq: Once | INTRAVENOUS | Status: AC

## 2021-07-20 MED ORDER — ACETAMINOPHEN 325 MG PO TABS: 650.0000 mg | ORAL_TABLET | Freq: Four times a day (QID) | ORAL | Status: DC | PRN

## 2021-07-20 MED ORDER — POTASSIUM CHLORIDE 10 MEQ/100ML IV SOLN
10.0000 meq | INTRAVENOUS | Status: AC
  Administered 2021-07-20 (×4): 10 meq via INTRAVENOUS
  Filled 2021-07-20 (×4): qty 100

## 2021-07-20 MED ORDER — SODIUM CHLORIDE 0.9 % IV SOLN
250.0000 mL | INTRAVENOUS | Status: DC
Start: 2021-07-20 — End: 2021-07-24

## 2021-07-20 MED ORDER — ONDANSETRON HCL 4 MG/2ML IJ SOLN: 4.0000 mg | Freq: Four times a day (QID) | INTRAMUSCULAR | Status: DC | PRN

## 2021-07-20 MED ORDER — POTASSIUM CHLORIDE 10 MEQ/100ML IV SOLN
10.0000 meq | Freq: Once | INTRAVENOUS | Status: AC
  Administered 2021-07-21: 10 meq via INTRAVENOUS
  Filled 2021-07-20: qty 100

## 2021-07-20 MED ORDER — CALCIUM GLUCONATE-NACL 1-0.675 GM/50ML-% IV SOLN
1.0000 g | Freq: Once | INTRAVENOUS | Status: AC
  Administered 2021-07-20: 1000 mg via INTRAVENOUS
  Filled 2021-07-20: qty 50

## 2021-07-20 MED ORDER — ACETAMINOPHEN 650 MG RE SUPP
650.0000 mg | Freq: Four times a day (QID) | RECTAL | Status: DC | PRN
  Administered 2021-07-20: 650 mg via RECTAL
  Filled 2021-07-20: qty 1

## 2021-07-20 NOTE — Consult Note (Signed)
?[facility] ?CONSULT NOTE  ? ? ?Date: 07/20/2021      ?      ?      ?Patient Name:  Duane Hill.  MRN: [id number]  ?DOB: 05/25/1940  Age / Sex: 81 y.o., male   ?      ?PCP: Jon Billings, NP     ?      ?      ?Service Requesting Consult: Critical care     ?      ?      ?Reason for Consult: Acute kidney injury, hyperkalemia     ?      ? ?History of Present Illness: ?Patient is a 81 y.o. male with a PMHx of prostate cancer, GERD, hypertension, hyperlipidemia, history of radiation proctitis, diabetes mellitus type 2, asthma, chronic kidney disease stage IIIb baseline EGFR 41 who was admitted to [facility] on 07/19/2021 for evaluation of generalized weakness, penile bleeding, sudden onset slurred speech.  Patient unable to offer any history at this point time as he is currently on the ventilator.  Patient's daughter is at the bedside.  It appears that he has been having penile bleeding for several days.  Urology has been consulted for this issue.  Foley catheter has been placed.  He was on continuous bladder irrigation but this has been stopped at the moment.  We are asked to see him for evaluation management of acute.  Upon initial presentation creatinine was 6.6 with a serum potassium of 6.9.  This a.m. serum potassium actually a bit low at 3.0.  Creatinine is also come down to 5.1 with decompression of the bladder.  Patient appears to have underlying chronic kidney disease.  Baseline creatinine is 1.69 with an EGFR of 41 on 07/01/2021.  Patient is critically ill at the moment and requiring ventilatory support. ? ? ?Medications: ?Outpatient medications: ?Medications Prior to Admission  ?Medication Sig Dispense Refill Last Dose  ? atorvastatin (LIPITOR) 20 MG tablet Take 1 tablet (20 mg total) by mouth daily. 90 tablet 0 07/18/2021  ? carvedilol (COREG) 25 MG tablet Take 1 tablet (25 mg total) by mouth 2 (two) times daily with a meal. 180 tablet 1 07/18/2021  ? Choline Fenofibrate (FENOFIBRIC ACID)  135 MG CPDR Take 1 capsule by mouth daily. 90 capsule 1 07/18/2021  ? fluticasone (FLONASE) 50 MCG/ACT nasal spray Place 1 spray into both nostrils 2 (two) times daily. 16 g 2 07/18/2021  ? gabapentin (NEURONTIN) 300 MG capsule Take 2 capsules (600 mg total) by mouth 2 (two) times daily. 360 capsule 0 07/18/2021  ? glipiZIDE (GLUCOTROL) 5 MG tablet Take 5 mg by mouth 2 (two) times daily before a meal.   07/18/2021  ? metFORMIN (GLUCOPHAGE-XR) 500 MG 24 hr tablet Take 1 tablet (500 mg total) by mouth in the morning and at bedtime. 180 tablet 0 07/18/2021  ? omeprazole (PRILOSEC) 40 MG capsule Take 1 capsule (40 mg total) by mouth daily. 30 capsule 0 07/18/2021  ? vitamin B-12 (CYANOCOBALAMIN) 1000 MCG tablet Take 1 tablet (1,000 mcg total) by mouth daily. 90 tablet 1 07/18/2021  ? acetaminophen (TYLENOL) 500 MG tablet Take 1,000 mg by mouth every 6 (six) hours as needed. Patient uss Caplet extra strength   PRN at PRN  ? albuterol (VENTOLIN HFA) 108 (90 Base) MCG/ACT inhaler Take 2 puffs every 4-6 hours as necessary for shortness of breath 18 g 0 PRN at PRN  ? Azelastine HCl 0.15 % SOLN Place 2  sprays into both nostrils 2 (two) times daily. Place 2 sprays into both nostrils 2 (two) times daily. 90 mL 0 PRN at PRN  ? cetirizine (ZYRTEC) 10 MG tablet Take 1 tablet (10 mg total) by mouth daily. (Patient not taking: Reported on 07/19/2021) 90 tablet 1 Not Taking  ? glucose blood test strip CHECK BLOOD SUGAR TWICE DAILY. 50 each PRN   ? lisinopril (ZESTRIL) 40 MG tablet Take 1 tablet (40 mg total) by mouth daily. (Patient not taking: Reported on 07/19/2021) 90 tablet 1 Not Taking  ? Microlet Lancets MISC USE TWICE DAILY AS DIRECTED. 100 each 0   ? montelukast (SINGULAIR) 10 MG tablet Take 1 tablet (10 mg total) by mouth at bedtime. (Patient not taking: Reported on 07/19/2021) 90 tablet 0 Not Taking  ? ? ?Current medications: ?Current Facility-Administered Medications  ?Medication Dose Route Frequency Provider Last Rate Last Admin  ? 0.9 %   sodium chloride infusion   Intravenous PRN Flora Lipps, MD   Stopped at 07/19/21 2257  ? acetaminophen (TYLENOL) tablet 650 mg  650 mg Oral Q6H PRN Val Riles, MD      ? cefTRIAXone (ROCEPHIN) 2 g in sodium chloride 0.9 % 100 mL IVPB  2 g Intravenous Q24H Flora Lipps, MD   Stopped at 07/19/21 2115  ? chlorhexidine (PERIDEX) 0.12 % solution 15 mL  15 mL Mouth Rinse BID Emeterio Reeve, DO   15 mL at 07/20/21 5956  ? Chlorhexidine Gluconate Cloth 2 % PADS 6 each  6 each Topical [id number] Flora Lipps, MD   6 each at 07/19/21 1600  ? insulin aspart (novoLOG) injection 0-9 Units  0-9 Units Subcutaneous Q4H Emeterio Reeve, DO   5 Units at 07/20/21 0730  ? lactated ringers infusion   Intravenous Continuous Emeterio Reeve, DO 125 mL/hr at 07/20/21 0700 Infusion Verify at 07/20/21 0700  ? MEDLINE mouth rinse  15 mL Mouth Rinse q12n4p Emeterio Reeve, DO      ? ondansetron [facility]) injection 4 mg  4 mg Intravenous Q6H PRN Val Riles, MD      ? sodium chloride irrigation 0.9 % 3,000 mL  3,000 mL Irrigation Continuous Vanessa Texline, MD   3,000 mL at 07/20/21 4332  ?  ? ? ?Allergies: ?Allergies  ?Allergen Reactions  ? Protonix [Pantoprazole Sodium] Nausea And Vomiting  ?  ? ? ?Past Medical History: ?Past Medical History:  ?Diagnosis Date  ? Arthritis   ? Asthma   ? Inhaler as needed  ? Dupuytren contracture   ? GERD (gastroesophageal reflux disease)   ? Hyperlipidemia   ? Hypertension   ? Radiation proctitis   ? Renal insufficiency   ? ? ? ?Past Surgical History: ?Past Surgical History:  ?Procedure Laterality Date  ? PROSTATE SURGERY    ? ? ? ?Family History: ?Family History  ?Problem Relation Age of Onset  ? Heart attack Mother   ? Hypertension Father   ? Stroke Father   ? Cancer Father   ? Diabetes Father   ? Heart attack Father   ? ? ? ?Social History: ?Social History  ? ?Socioeconomic History  ? Marital status: Divorced  ?  Spouse name: Not on file  ? Number of children: Not on file  ? Years of education:  Not on file  ? Highest education level: High school graduate  ?Occupational History  ? Not on file  ?Tobacco Use  ? Smoking status: Former  ?  Types: Cigarettes  ?  Quit date: 11/28/1986  ?  Years since quitting: 34.6  ? Smokeless tobacco: Never  ?Vaping Use  ? Vaping Use: Never used  ?Substance and Sexual Activity  ? Alcohol use: Yes  ?  Alcohol/week: 7.0 standard drinks  ?  Types: 7 Glasses of wine per week  ?  Comment: 1 glass of wine at night  ? Drug use: No  ? Sexual activity: Never  ?Other Topics Concern  ? Not on file  ?Social History Narrative  ? Retired from city   ? ?Social Determinants of Health  ? ?Financial Resource Strain: Low Risk   ? Difficulty of Paying Living Expenses: Not very hard  ?Food Insecurity: No Food Insecurity  ? Worried About Charity fundraiser in the Last Year: Never true  ? Ran Out of Food in the Last Year: Never true  ?Transportation Needs: Unmet Transportation Needs  ? Lack of Transportation (Medical): Yes  ? Lack of Transportation (Non-Medical): Yes  ?Physical Activity: Inactive  ? Days of Exercise per Week: 0 days  ? Minutes of Exercise per Session: 0 min  ?Stress: No Stress Concern Present  ? Feeling of Stress : Only a little  ?Social Connections: Socially Isolated  ? Frequency of Communication with Friends and Family: Never  ? Frequency of Social Gatherings with Friends and Family: Never  ? Attends Religious Services: Never  ? Active Member of Clubs or Organizations: No  ? Attends Archivist Meetings: Never  ? Marital Status: Divorced  ?Intimate Partner Violence: Not At Risk  ? Fear of Current or Ex-Partner: No  ? Emotionally Abused: No  ? Physically Abused: No  ? Sexually Abused: No  ? ? ? ?Review of Systems: ?Patient unable to provide as he is currently on the ventilator. ? ?Vital Signs: ?Blood pressure (!) 96/52, pulse 88, temperature 99.2 ?F (37.3 ?C), resp. rate (!) 27, height _0  (1.727 m), weight 67.8 kg, SpO2 97 %. ? ?Weight trends: ?Filed Weights  ? 07/19/21  2200  ?Weight: 67.8 kg  ? ? ? ?Physical Exam: ?General: Critically ill-appearing  ?Head: Normocephalic, atraumatic.  Endotracheal tube in place  ?Eyes: Anicteric  ?Neck: Supple  ?Lungs:  Scattered rhonch

## 2021-07-20 NOTE — Progress Notes (Addendum)
[facility] Progress Note ? ?Patient: Duane Hill.    WGN:[id number]  DOA: 07/19/2021    ? ?Date of Service: the patient was seen and examined on 07/20/2021 ? ?Chief Complaint  ?Patient presents with  ? Aphasia  ? ?Brief hospital course: ?Taken from H&P and ICU notes ? ?Patient presented to ED 07/19/2021 chief complaint of generalized weakness, severe penile bleeding, sudden onset slurred speech difficulty swallowing this morning with last known well the night prior when he went to bed.  Penile bleeding onset was to 4 days ago began worsening and copious bleeding, but no pain, starting 3 days ago.  He has a history of prostate cancer which was treated with radiation and seed placement into the prostate. ? ?ED course: Patient was noted to be anemic with hemoglobin 7.6, thrombocytopenic with platelets 146, AKI/ARF with creatinine 6.68, hyperkalemic with potassium 6.9, troponin 77, AST 129 and ALT 47, EKG showed no significant abnormality, CT head showed no acute findings, CT abdomen/pelvis/chest showed hydroureteronephrosis.  ED was in consultation with neurology, urology, nephrology.  Patient was started on continuous bladder irrigation, treated for hyperkalemia and repeat potassium was improved to 5.3.,  Ordered 1 unit PRBC which is currently pending as of time of admission.  He was initially hypoxic but improved, taken off O2 via [place]. ?  ?Procedures and Significant Results:  ?Continuous bladder irrigation initiated 07/19/21  ?Transfusion 1 unit PRBC for 223 ?CXR 07/19/2021 ?CT head without contrast 07/19/2021: Increased ventricular size with possible mild hydrocephalus, atrophy, atherosclerotic disease noted in carotid arteries ?CT chest/abdomen/pelvis 07/19/2021: Enlarged lymph nodes in chest, hydroureteronephrosis, questionable right lower lung infection ?  ? ?Assessment and Plan: ?# Acute hypoxic respiratory failure ?Atelectatic on CXR which could be cause of mildly elevated A-a gradient on ABG yesterday.  Absence of infiltrate and troponinemia raise possibility of PE but RV does not look remarkably distended on my bedside US.  With A-a gradient of only 63 it seems unlikely that hypoxia alone driving his tachypnea to reach pH of 7.6 and I worry about central process like CVA driving/contributing to his breathing pattern.  ?- can trial off NIV today ?  ?# Troponinemia/NSTEMI ?EKG with ST flattening in lateral leads. Sharp rise in troponin this AM, EKG unchanged. ?-trend troponin ?-f/u TTE ?-consult cardiology but hamstrung with anticoagulation/antiPLT right now and AKI, AMS preclude cath ?  ?# Sepsis ?Unclear source, possible UTI ?- ceftriaxone ?- RUQ [place] as below ?- BCx, follow and narrow as able ?  ?Hypotension due to sepsis ?Started Levophed ?Held home medications Coreg and lisinopril ? ? ?Hematuria, history of bladder cancer ?Continue CBI ?Monitor H&H ?Follow urology ? ? ?# Acute renal failure ?Definite component of obstructive uropathy but may have been multifactorial ?-check CK ?-continue Foley Catheter for at least 7d per uro ?-Avoid nephrotoxic agents ?-Follow urine output, BMP ?-Ensure adequate renal perfusion, optimize oxygenation ?-Renal dose medications ?  ?# Transaminitis ?- recheck LFTs ?- RUQ [place] 1. Cholelithiasis without definite evidence of acute cholecystitis. Clinical correlation is suggested. If there is high clinical concern HIDA scan could be considered for further evaluation. 2. Hepatic steatosis ?- DIC panel ?- ammonia ?  ?# Suspected acute pure motor stroke ?- MR brain if stable enough to go pending GoC discussion ?- holding ASA in setting ABLA, atorvastatin in setting transaminitis ?- neuro following ?  ? ?# Pancytopenia,  ?Possibly developing in setting sepsis and ABLA.  ?- transfuse for Hb 7 ?- PLT transfusion for 50 if active blood loss  ?  S/p PRBC transfusion, posttransfusion Hb 9.8 ?Check CBC daily ? ?NIDDM T2,  ?held glipizide and metformin home medications for now ?Continue Humalog sliding  scale, monitor FSBG ? ? ? ?Dysphagia,  ?follow SLP, keep n.p.o. in the meantime ? ? ?Body mass index is 22.73 kg/m?.  ?Interventions: ?  ?Consultants:  ?Neurology: Dr. Leonel Ramsay ?Urology: Dr. Bernardo Heater ?Nephrology: Dr. Holley Raring  ?Cardiology Dr. Nehemiah Massed ?Pulmonary critical care, Dr Mortimer Fries ?   ?Diet: N.p.o. due to dysphagia ?DVT Prophylaxis: SCD, pharmacological prophylaxis contraindicated due to hematuria   ? ?Advance goals of care discussion: DNR ? ?Family Communication: family was present at bedside, at the time of interview.  ?The pt provided permission to discuss medical plan with the family. Opportunity was given to ask question and all questions were answered satisfactorily.  ? ?Disposition:  ?Pt is from home, admitted with aphasia, hematuria, sepsis, renal failure, still in critical condition, which precludes a safe discharge. ?Discharge to most likely SNF, when clinically stable. ? ?Subjective: Patient was admitted yesterday due to multiple complaints as stated in the assessment and plan.  Patient was mildly short of breath unable to offer any complaints due to expressive aphasia, patient was able to vocalize his name, unable to offer any complaints. ?On abdominal palpation he was complaining of abdominal pain which was generalized. ?Family was at bedside, management plan discussed and all question and concerns answered. ? ? ?Physical Exam: ?General:  lethargic, oriented to himself only.  ?Appear in moderate distress, affect depressed ?Eyes: PERRLA ?ENT: Oral Mucosa Clear, moist  ?Neck: no JVD,  ?Cardiovascular: S1 and S2 Present, no Murmur,  ?Respiratory: increased respiratory effort, Bilateral Air entry equal and Decreased, mild Crackles, mild wheezes ?Abdomen: Bowel Sound present, Soft and mild generalized tenderness,  ?Skin: no rahses ?Extremities: no Pedal edema, no calf tenderness ?Neurologic: Expressive aphasia, CN grossly intact, right-sided weakness power 4/5 ?Gait not checked due to patient safety  concerns ? ?Vitals:  ? 07/20/21 1400 07/20/21 1415 07/20/21 1430 07/20/21 1445  ?BP: 110/60 (!) 110/49 (!) 99/43 (!) 100/42  ?Pulse: 79 93 91 90  ?Resp: (!) 31 (!) 25 (!) 32 (!) 30  ?Temp:   100 ?F (37.8 ?C)   ?TempSrc:      ?SpO2: 96% 96% 96% 96%  ?Weight:      ?Height:      ? ? ?Intake/Output Summary (Last 24 hours) at 07/20/2021 1622 ?Last data filed at 07/20/2021 1549 ?Gross per 24 hour  ?Intake 27510.65 ml  ?Output 17325 ml  ?Net 10185.65 ml  ? ?Filed Weights  ? 07/19/21 2200  ?Weight: 67.8 kg  ? ? ?Data Reviewed: ?I have personally reviewed and interpreted daily labs, tele strips, imagings as discussed above. ?I reviewed all nursing notes, pharmacy notes, vitals, pertinent old records ?I have discussed plan of care as described above with RN and patient/family. ? ?CBC: ?Recent Labs  ?Lab 07/19/21 ?1053 07/19/21 ?1901 07/19/21 ?2256 07/20/21 ?0424 07/20/21 ?1101  ?WBC 8.9  --   --  2.7*  --   ?NEUTROABS 5.5  --   --   --   --   ?HGB 7.6* 8.3* 7.2* 9.8* 9.5*  ?HCT 24.3* 24.4* 21.5* 28.5* 27.4*  ?MCV 93.1  --   --  85.3  --   ?PLT 146*  --   --  86*  --   ? ?Basic Metabolic Panel: ?Recent Labs  ?Lab 07/19/21 ?1053 07/19/21 ?1319 07/19/21 ?1901 07/19/21 ?2256 07/20/21 ?0424 07/20/21 ?2409 07/20/21 ?1446  ?NA 134*  --   --   --  142  --  141  ?K 6.9*   < > 5.8* 4.2 3.2* 3.0* 3.1*  ?CL 103  --   --   --  91*  --  96*  ?CO2 10*  --   --   --  34*  --  33*  ?GLUCOSE 192*  --   --   --  260*  --  138*  ?BUN 71*  --   --   --  81*  --  79*  ?CREATININE 6.68*  --   --   --  5.11*  --  4.44*  ?CALCIUM 7.5*  --   --   --  6.6*  --  6.2*  ?MG  --   --   --   --  2.3  --   --   ?PHOS  --   --   --   --  6.4*  --   --   ? < > = values in this interval not displayed.  ? ? ?Studies: ?DG Chest Port 1 View ? ?Result Date: 07/20/2021 ?CLINICAL DATA:  Tachypnea EXAM: PORTABLE CHEST 1 VIEW COMPARISON:  07/19/2021 FINDINGS: The heart size and mediastinal contours are within normal limits. Aortic atherosclerosis. Low lung volumes with mild  bibasilar atelectasis. No pleural effusion or pneumothorax. The visualized skeletal structures are unremarkable. IMPRESSION: Low lung volumes with mild bibasilar atelectasis. Electronically Signed   By: Vernell Barrier

## 2021-07-20 NOTE — Progress Notes (Signed)
?   07/20/21 1000  ?Clinical Encounter Type  ?Visited With Patient and family together  ?Visit Type Initial  ?Referral From Chaplain  ? ?Chaplain provided follow up support to patient and family. ?

## 2021-07-20 NOTE — Consult Note (Addendum)
? ?NAME:  Duane Hill., MRN:  [id number], DOB:  05-May-1940, LOS: 1 ?ADMISSION DATE:  07/19/2021 ? ? ?History of Present Illness:  ?Patient presented to ED 07/19/2021 chief complaint of generalized weakness, severe penile bleeding, sudden onset slurred speech difficulty swallowing this morning  ?Signs and symptoms of acute CVA ?Penile bleeding onset was to 4 days ago began worsening and copious bleeding,  ?He has a history of prostate cancer which was treated with radiation and seed placement into the prostate. Urology Consulted, Neurology Consulted ? ?ED course: Patient was noted to be anemic with hemoglobin 7.6, thrombocytopenic with platelets 146, AKI/ARF with creatinine 6.68, hyperkalemic with potassium 6.9, troponin 77, AST 129 and ALT 47, EKG showed no significant abnormality,  ? ?CT head showed no acute findings, CT abdomen/pelvis/chest showed hydroureteronephrosis.   ? ?ED was in consultation with neurology, urology, nephrology.  Patient was started on continuous bladder irrigation, treated for hyperkalemia and repeat potassium was improved to 5.3.,   ? ?Ordered 1 unit PRBC which is currently pending as of time of admission.  He was initially hypoxic but improved, taken off O2 via [place]. ? ?Patient subsequently developed acute resp distress ?Looks very ill ?+multiorgan failure ? ?Consultants:  ?Neurology: Dr. Leonel Ramsay ?Urology: Dr. Bernardo Heater ?Nephrology: Dr. Holley Raring  ? ?  ?Procedures and Significant Results:  ?Continuous bladder irrigation initiated 07/19/21  ?Transfusion 1 unit PRBC for 223 ?CXR 07/19/2021 ?CT head without contrast 07/19/2021: Increased ventricular size with possible mild hydrocephalus, atrophy, atherosclerotic disease noted in carotid arteries ?CT chest/abdomen/pelvis 07/19/2021: Enlarged lymph nodes in chest, hydroureteronephrosis ?  ? ?  ? ?Significant Hospital Events: ?Including procedures, antibiotic start and stop dates in addition to other pertinent events   ?Admitted to SD/ICU for further  assessment, PCCM consulted ? ? ? ? ?Subjective/Interval HPI:  ?GoC discussion with family and Dr. Mortimer Fries, now DNR/DNI. Hb stable overnight, urine in bag pink. Remained on BiPAP for work of breathing.  ? ? ?Antimicrobials:  ? ?Antibiotics Given (last 72 hours)   ? ? Date/Time Action Medication Dose Rate  ? 07/19/21 2045 New Bag/Given  ? cefTRIAXone (ROCEPHIN) 2 g in sodium chloride 0.9 % 100 mL IVPB 2 g 200 mL/hr  ? ?  ? ? ? ? ?Objective   ?Blood pressure (!) 98/52, pulse 92, temperature 99.3 ?F (37.4 ?C), temperature source Axillary, resp. rate (!) 31, height '5\' 8"'$  (1.727 m), weight 67.8 kg, SpO2 100 %. ?   ?FiO2 (%):  [50 %-97 %] 50 %  ? ?Intake/Output Summary (Last 24 hours) at 07/20/2021 0713 ?Last data filed at 07/20/2021 0654 ?Gross per 24 hour  ?Intake 23625.05 ml  ?Output 21225 ml  ?Net 2400.05 ml  ? ?Filed Weights  ? 07/19/21 2200  ?Weight: 67.8 kg  ? ? ? ? ?REVIEW OF SYSTEMS ? ?Dysarthria limits ROS ? ? ?PHYSICAL EXAMINATION: ? ?GENERAL:critically ill appearing ?EYES: Pupils equal, round, reactive to light.  No scleral icterus.  ?MOUTH: Dry MM ?NECK: Supple.  ?PULMONARY: ctab, equal chest rise, tachypneic ?CARDIOVASCULAR: S1 and S2.  No murmurs  ?GASTROINTESTINAL: Soft, ttp over RUQ, +mildly distended. Positive bowel sounds.  ?MUSCULOSKELETAL: RT chest wall ecchymosis ?NEUROLOGIC: alert, awake, nonverbal ?SKIN:intact,lukewarm,dry ? ? ? ? ?Labs/imaging that I havepersonally reviewed  ?(right click and "Reselect all SmartList Selections" daily)  ?No new imaging ?K 3.2 ?Bicarb 34 ?BUN 81 ?Cr 5.11 ?AG 17 ?AST 1438 ?Trop 327 ?Procal > 150 ?WBC 2.7 ?Hb 9.8 ?PLT 86 ? ? ?ASSESSMENT AND PLAN ? ?# Acute hypoxic respiratory  failure ?Atelectatic on CXR which could be cause of mildly elevated A-a gradient on ABG yesterday. Absence of infiltrate and troponinemia raise possibility of PE but RV does not look remarkably distended on my bedside US.  With A-a gradient of only 63 it seems unlikely that hypoxia alone driving his  tachypnea to reach pH of 7.6 and I worry about central process like CVA driving/contributing to his breathing pattern.  ?- can trial off NIV today ? ?# Troponinemia/NSTEMI ?EKG with ST flattening in lateral leads. Sharp rise in troponin this AM, EKG unchanged. ?-trend troponin ?-f/u TTE ?-consult cardiology but hamstrung with anticoagulation/antiPLT right now and AKI, AMS preclude cath ? ?# Sepsis ?Unclear source, possible UTI ?- ceftriaxone ?- RUQ [place] as below ?- BCx, follow and narrow as able ? ?# Acute renal failure ?Definite component of obstructive uropathy but may have been multifactorial ?-check CK ?-continue Foley Catheter for at least 7d per uro ?-Avoid nephrotoxic agents ?-Follow urine output, BMP ?-Ensure adequate renal perfusion, optimize oxygenation ?-Renal dose medications ? ?# Transaminitis ?- recheck LFTs ?- RUQ [place] given RUQ tenderness which might just be due to fall ?- DIC panel ?- ammonia ? ?# Suspected acute pure motor stroke ?- MR brain if stable enough to go pending GoC discussion ?- holding ASA in setting ABLA, atorvastatin in setting transaminitis ?- neuro following ? ?# Pancytopenia ?Possibly developing in setting sepsis and ABLA.  ?- trend CBC ?- transfuse for Hb 7 ?- PLT transfusion for 50 if active blood loss  ? ? ? ?Best practice (right click and "Reselect all SmartList Selections" daily)  ?Diet: NPO ?DVT prophylaxis: Contraindicated ?Foley:  Yes, and it is still needed ?Mobility:  bed rest  ?Code Status:  DNR ?Disposition:ICU ? ? ?This patient is critically ill with multiple organ system failure; which, requires frequent high complexity decision making, assessment, support, evaluation, and titration of therapies. This was completed through the application of advanced monitoring technologies and extensive interpretation of multiple databases. During this encounter critical care time was devoted to patient care services described in this note for 35 minutes.  ? ?Walker Shadow  ?[place] ? ? ? ? ? ?

## 2021-07-20 NOTE — Progress Notes (Signed)
Neurology Progress Note ? ?Patient ID: Duane Hill. is a 81 y.o. with PMHx of  has a past medical history of Arthritis, Asthma, Dupuytren contracture, GERD (gastroesophageal reflux disease), Hyperlipidemia, Hypertension, Radiation proctitis, and Renal insufficiency.  He presented with right-sided weakness on awakening as well as dysarthria. Subsequently clinically declined with severe AKI, fever and respiratory distress.  ? ?Major interval events/Subjective: ?Received 1 unit of blood ?He has also been febrile overnight, was hypotensive and developed severe acute hypoxic and hypercapnic respiratory failure for which CCM is following; he has been on BiPAP ? ?Exam: ?Current vital signs: ?BP (!) 96/52   Pulse 88   Temp 99.2 ?F (37.3 ?C)   Resp (!) 27   Ht '5\' 8"'$  (1.727 m)   Wt 67.8 kg   SpO2 97%   BMI 22.73 kg/m?  ?Vital signs in last 24 hours: ?Temp:  [97.2 ?F (36.2 ?C)-101.7 ?F (38.7 ?C)] 99.2 ?F (37.3 ?C) (04/03 0700) ?Pulse Rate:  [35-116] 88 (04/03 0700) ?Resp:  [15-41] 27 (04/03 0700) ?BP: (86-150)/(13-98) 96/52 (04/03 0700) ?SpO2:  [72 %-100 %] 97 % (04/03 0750) ?FiO2 (%):  [45 %-97 %] 45 % (04/03 0750) ?Weight:  [67.8 kg] 67.8 kg (04/02 2200) ? ? ?Gen: In bed, acutely ill appearing ?Resp: High flow nasal cannula in place, nursing reports that he becomes very uncomfortable with being laid flat ?Cardiac: Perfusing extremities well  ?Abd: soft, nt ?GU: Clots in Foley output ? ?Neuro: ?MS: Somnolent, but briefly opens eyes and is able to follow some commands, able to repeat ?CN: EOMI to tracking examiner, continues to have a right facial droop, corneals intact eyelash brush, remainder difficult to assess given his somnolence ?Sensory/motor: Briefly holds bilateral upper extremities antigravity but the right arm drops quickly to the bed, more quickly than the left side. ? ?Pertinent Labs: ? ? ?Basic Metabolic Panel: ?Recent Labs  ?Lab 07/19/21 ?1053 07/19/21 ?1319 07/19/21 ?1541 07/19/21 ?1901  07/19/21 ?2256 07/20/21 ?0424 07/20/21 ?9518  ?NA 134*  --   --   --   --  142  --   ?K 6.9*   < > 5.6* 5.8* 4.2 3.2* 3.0*  ?CL 103  --   --   --   --  91*  --   ?CO2 10*  --   --   --   --  34*  --   ?GLUCOSE 192*  --   --   --   --  260*  --   ?BUN 71*  --   --   --   --  81*  --   ?CREATININE 6.68*  --   --   --   --  5.11*  --   ?CALCIUM 7.5*  --   --   --   --  6.6*  --   ?MG  --   --   --   --   --  2.3  --   ?PHOS  --   --   --   --   --  6.4*  --   ? < > = values in this interval not displayed.  ? ? ?CBC: ?Recent Labs  ?Lab 07/19/21 ?1053 07/19/21 ?1901 07/19/21 ?2256 07/20/21 ?0424  ?WBC 8.9  --   --  2.7*  ?NEUTROABS 5.5  --   --   --   ?HGB 7.6* 8.3* 7.2* 9.8*  ?HCT 24.3* 24.4* 21.5* 28.5*  ?MCV 93.1  --   --  85.3  ?PLT 146*  --   --  51*  ? ? ?Coagulation Studies: ?Recent Labs  ?  07/19/21 ?1053  ?LABPROT 22.6*  ?INR 2.0*  ?  ? ? ?Lab Results  ?Component Value Date  ? HGBA1C 6.3 (H) 07/01/2021  ? ?Lab Results  ?Component Value Date  ? CHOL 135 07/01/2021  ? HDL 25 (L) 07/01/2021  ? [place] 73 07/01/2021  ? TRIG 219 (H) 07/01/2021  ? CHOLHDL 5.4 (H) 07/01/2021  ? ? ?Impression: 81 year old presented with a pure motor hemiparesis, aspirin held secondary to hemoglobin of 7.6 initially.  Remains critically ill with sepsis and multiorgan failure, but hemoglobin has been stable since transfusion. ? ?Recommendations: ?-MRI brain pending clinical stability ?-Echocardiogram read pending ?-Start aspirin when safe to do so from primary team's perspective with regards to bleeding risk, likely reasonable to continue holding at this point due to INR of 2  ?-Discussed neurological prognosis with family at bedside extensively ?-Neurology will continue to follow ? ? ?Lesleigh Noe MD-PhD ?[facility] ?[phone]  ?[facility] coverage for [facility] is from 8 AM to 4 AM in-house and 4 PM to 8 PM by telephone/video. 8 PM to 8 AM emergent questions or overnight urgent questions should be addressed to  [facility]urology On-call or [facility] neurohospitalist; contact information can be found on AMION ? ?CRITICAL CARE ?Performed by: Lorenza Chick ? ? ?Total critical care time: 35 minutes ? ?Critical care time was exclusive of separately billable procedures and treating other patients. ? ?Critical care was necessary to treat or prevent imminent or life-threatening deterioration. ? ?Critical care was time spent personally by me on the following activities: development of treatment plan with patient and/or surrogate as well as nursing, discussions with consultants, evaluation of patient's response to treatment, examination of patient, obtaining history from patient or surrogate, ordering and performing treatments and interventions, ordering and review of laboratory studies, ordering and review of radiographic studies, pulse oximetry and re-evaluation of patient's condition. ? ? ?

## 2021-07-20 NOTE — Progress Notes (Signed)
During rounds ICU MD notified of patients lactic acid of 3.1. At this time no additional orders. Continue to assess.  ?

## 2021-07-20 NOTE — Consult Note (Addendum)
?[facility] CONSULT NOTE  ? ?    ?Patient ID: ?Duane Hill. ?MRN: [id number] ?DOB/AGE: 1940-08-30 81 y.o. ? ?Admit date: 07/19/2021 ?Referring Physician Donell Beers, NP ?Primary Physician Jon Billings, NP ?Primary Cardiologist none ?Reason for Consultation elevated troponin ? ?HPI: The patient is a 81 year old male with a past medical history of hypertension, hyperlipidemia, type 2 diabetes, history of prostate cancer s/p radiation and seed placement 2013, CKD 3 who presented to [facility] ED 07/19/2021 with right-sided weakness and significant penile bleeding x4 days. Was found to have an acute likely ischemic stroke on admission, complicated by acute blood loss anemia s/p PRBCs, acute renal failure, shock liver, and hypoxic respiratory failure requiring BIPAP -weaned to HFNC on 4/3. Cardiology is consulted because of his elevated troponin. ? ?The patient was seen by urology for his penile bleeding and was started on continuous bladder irrigation.  Neurology has a strong suspicion for ischemic infarct with further recommendation for MRI of the brain.  Unable to use antiplatelet therapy due to bleeding.  Patient was initially slightly hypoxic only requiring oxygen by nasal cannula but decompensated while in the emergency department last night requiring BIPAP and HFNC, admitted to the ICU.  Seen and examined in the ICU with his 2 daughters at bedside who provide most of the history.  They state that the patient lives alone and was having blood in his urine past few days and sounded confused on the phone his sister called him 2 days ago.  They note that the patient's father had a history of CAD and heart attack.  They do not think the patient has ever has seen a cardiologist before or had a heart catheterization.  He previously smoked tobacco years ago and is a daily wine drinker per his daughters.  ? ?Labs on admission are notable for hemoglobin of 7.6, thrombocytopenic with platelets 146, creatinine  6.68, potassium 6.9, initial troponin 77, AST 129 ALT 47.  Repeat labs early this morning show persistent hypokalemia at 3.0, creatinine somewhat improving to 5.11, AST markedly elevated at 1400, ALT 713, CK 6284, troponins uptrending [id number].  Fibrinogen elevated at 779.  PT/INR 23, 2.0 respectively.  H&H improved after PRBCs to 9.8.  Platelets 86.  Procalcitonin greater than 150.  Lactate 3.1 ? ?Review of systems unable to be assessed due to his somnolence dysarthria. ? ?Past Medical History:  ?Diagnosis Date  ? Arthritis   ? Asthma   ? Inhaler as needed  ? Dupuytren contracture   ? GERD (gastroesophageal reflux disease)   ? Hyperlipidemia   ? Hypertension   ? Radiation proctitis   ? Renal insufficiency   ?  ?Past Surgical History:  ?Procedure Laterality Date  ? PROSTATE SURGERY    ?  ?Medications Prior to Admission  ?Medication Sig Dispense Refill Last Dose  ? atorvastatin (LIPITOR) 20 MG tablet Take 1 tablet (20 mg total) by mouth daily. 90 tablet 0 07/18/2021  ? carvedilol (COREG) 25 MG tablet Take 1 tablet (25 mg total) by mouth 2 (two) times daily with a meal. 180 tablet 1 07/18/2021  ? Choline Fenofibrate (FENOFIBRIC ACID) 135 MG CPDR Take 1 capsule by mouth daily. 90 capsule 1 07/18/2021  ? fluticasone (FLONASE) 50 MCG/ACT nasal spray Place 1 spray into both nostrils 2 (two) times daily. 16 g 2 07/18/2021  ? gabapentin (NEURONTIN) 300 MG capsule Take 2 capsules (600 mg total) by mouth 2 (two) times daily. 360 capsule 0 07/18/2021  ? glipiZIDE (GLUCOTROL) 5 MG tablet  Take 5 mg by mouth 2 (two) times daily before a meal.   07/18/2021  ? metFORMIN (GLUCOPHAGE-XR) 500 MG 24 hr tablet Take 1 tablet (500 mg total) by mouth in the morning and at bedtime. 180 tablet 0 07/18/2021  ? omeprazole (PRILOSEC) 40 MG capsule Take 1 capsule (40 mg total) by mouth daily. 30 capsule 0 07/18/2021  ? vitamin B-12 (CYANOCOBALAMIN) 1000 MCG tablet Take 1 tablet (1,000 mcg total) by mouth daily. 90 tablet 1 07/18/2021  ? acetaminophen (TYLENOL)  500 MG tablet Take 1,000 mg by mouth every 6 (six) hours as needed. Patient uss Caplet extra strength   PRN at PRN  ? albuterol (VENTOLIN HFA) 108 (90 Base) MCG/ACT inhaler Take 2 puffs every 4-6 hours as necessary for shortness of breath 18 g 0 PRN at PRN  ? Azelastine HCl 0.15 % SOLN Place 2 sprays into both nostrils 2 (two) times daily. Place 2 sprays into both nostrils 2 (two) times daily. 90 mL 0 PRN at PRN  ? cetirizine (ZYRTEC) 10 MG tablet Take 1 tablet (10 mg total) by mouth daily. (Patient not taking: Reported on 07/19/2021) 90 tablet 1 Not Taking  ? glucose blood test strip CHECK BLOOD SUGAR TWICE DAILY. 50 each PRN   ? lisinopril (ZESTRIL) 40 MG tablet Take 1 tablet (40 mg total) by mouth daily. (Patient not taking: Reported on 07/19/2021) 90 tablet 1 Not Taking  ? Microlet Lancets MISC USE TWICE DAILY AS DIRECTED. 100 each 0   ? montelukast (SINGULAIR) 10 MG tablet Take 1 tablet (10 mg total) by mouth at bedtime. (Patient not taking: Reported on 07/19/2021) 90 tablet 0 Not Taking  ? ?Social History  ? ?Socioeconomic History  ? Marital status: Divorced  ?  Spouse name: Not on file  ? Number of children: Not on file  ? Years of education: Not on file  ? Highest education level: High school graduate  ?Occupational History  ? Not on file  ?Tobacco Use  ? Smoking status: Former  ?  Types: Cigarettes  ?  Quit date: 11/28/1986  ?  Years since quitting: 34.6  ? Smokeless tobacco: Never  ?Vaping Use  ? Vaping Use: Never used  ?Substance and Sexual Activity  ? Alcohol use: Yes  ?  Alcohol/week: 7.0 standard drinks  ?  Types: 7 Glasses of wine per week  ?  Comment: 1 glass of wine at night  ? Drug use: No  ? Sexual activity: Never  ?Other Topics Concern  ? Not on file  ?Social History Narrative  ? Retired from city   ? ?Social Determinants of Health  ? ?Financial Resource Strain: Low Risk   ? Difficulty of Paying Living Expenses: Not very hard  ?Food Insecurity: No Food Insecurity  ? Worried About Charity fundraiser in  the Last Year: Never true  ? Ran Out of Food in the Last Year: Never true  ?Transportation Needs: Unmet Transportation Needs  ? Lack of Transportation (Medical): Yes  ? Lack of Transportation (Non-Medical): Yes  ?Physical Activity: Inactive  ? Days of Exercise per Week: 0 days  ? Minutes of Exercise per Session: 0 min  ?Stress: No Stress Concern Present  ? Feeling of Stress : Only a little  ?Social Connections: Socially Isolated  ? Frequency of Communication with Friends and Family: Never  ? Frequency of Social Gatherings with Friends and Family: Never  ? Attends Religious Services: Never  ? Active Member of Clubs or Organizations: No  ?  Attends Archivist Meetings: Never  ? Marital Status: Divorced  ?Intimate Partner Violence: Not At Risk  ? Fear of Current or Ex-Partner: No  ? Emotionally Abused: No  ? Physically Abused: No  ? Sexually Abused: No  ?  ?Family History  ?Problem Relation Age of Onset  ? Heart attack Mother   ? Hypertension Father   ? Stroke Father   ? Cancer Father   ? Diabetes Father   ? Heart attack Father   ?  ? ? ?Review of systems complete and found to be negative unless listed above  ? ? ?PHYSICAL EXAM ?General: Elderly and critically ill caucasian male, well nourished. Laying in ICU bed with his two daughters at bedside.  ?HEENT:  Normocephalic and atraumatic. ?Neck:  No JVD.  ?Lungs: tachypneic on HFNC. Clear bilaterally to auscultation anteriorly. ?Heart: HRRR . Normal S1 and S2 without gallops or murmurs. Radial & DP pulses 2+ bilaterally. ?Abdomen: Non-distended appearing.  ?Msk: Normal strength and tone for age. ?Extremities: Warm and well perfused. No clubbing, cyanosis. No peripheral edema. SCDs in place.  ?Neuro: opens eyes to voice and light touch.  ?Psych:  Unable to assess. ? ?Labs: ?  ?Lab Results  ?Component Value Date  ? WBC 2.7 (L) 07/20/2021  ? HGB 9.5 (L) 07/20/2021  ? HCT 27.4 (L) 07/20/2021  ? MCV 85.3 07/20/2021  ? PLT 86 (L) 07/20/2021  ?  ?Recent Labs  ?Lab  07/20/21 ?0424 07/20/21 ?6759  ?NA 142  --   ?K 3.2* 3.0*  ?CL 91*  --   ?CO2 34*  --   ?BUN 81*  --   ?CREATININE 5.11*  --   ?CALCIUM 6.6*  --   ?PROT 5.2*  5.2*  --   ?BILITOT 0.8  0.9  --   ?ALKPHOS 37*

## 2021-07-20 NOTE — Progress Notes (Signed)
Per Dr. Doyle Askew patient to unstable to go to MRI. Continue to assess.  ?

## 2021-07-20 NOTE — Progress Notes (Signed)
Inpatient Diabetes Program Recommendations ? ?AACE/ADA: New Consensus Statement on Inpatient Glycemic Control (2015) ? ?Target Ranges:  Prepandial:   less than 140 mg/dL ?     Peak postprandial:   less than 180 mg/dL (1-2 hours) ?     Critically ill patients:  140 - 180 mg/dL  ? ? Latest Reference Range & Units 07/19/21 10:50 07/19/21 15:34 07/19/21 18:20 07/19/21 19:27 07/19/21 21:00 07/19/21 23:34 07/20/21 04:10 07/20/21 07:18  ?Glucose-Capillary 70 - 99 mg/dL 170 (H) ? ?5 units Novolog IV + 50 cc D50% '@1244'$  111 (H) 73 89 ? ?10 units Novolog IV + 50 cc D50% '@2038'$  282 (H) 196 (H) ? ?2 units Novolog ? 259 (H) ? ?5 units Novolog ? 266 (H) ? ?5 units Novolog ?  ? ? ?Admit with:  ?Acute hypoxic respiratory failure ?Sepsis ?Acute renal failure ?Suspected acute pure motor stroke ?Symptomatic anemia secondary to penile bleed ? ?History: DM ? ?Home DM Meds: Glipizide 5 mg BID ?      Metformin 500 mg BID ? ?Current Orders: Novolog Sensitive Correction Scale/ SSI (0-9 units) Q4 hours ? ? ? ? ?MD- Note CBGs elevated since midnight ? ?Please consider starting Basal insulin: ? ?Levemir 7 units BID (0.2 units/kg) ? ? ? ?--Will follow patient during hospitalization-- ? ?Wyn Quaker RN, MSN, CDE ?Diabetes Coordinator ?Inpatient Glycemic Control Team ?Team Pager: [phone] (8a-5p) ? ?

## 2021-07-20 NOTE — TOC Initial Note (Signed)
Transition of Care (TOC) - Initial/Assessment Note  ? ? ?Patient Details  ?Name: Duane Hill. ?MRN: [id number] ?Date of Birth: 21-Dec-1940 ? ?Transition of Care (TOC) CM/SW Contact:    ?Shelbie Hutching, RN ?Phone Number: ?07/20/2021, 3:33 PM ? ?Clinical Narrative:                 ?Patient admitted to the hospital with symptomatic anemia related to bleeding from penis.  Patient currently in the ICU Stepdown status.  Patient is currently on HFNC 35L 35% and getting continuous bladder irrigation.  RNCM met with patient at the bedside, patient's daughter's and their husbands are present at the bedside also.  Patient is lethargic and unable to answer questions.  Per the family the relationship between the patient and themselves has been strained over the past 7 years or so,they have not really seen or spoken with the patient recently, they were unaware that their brother who had been living with the patient had not been living there for over a year now. ? ?Patient lives alone in [place] uses a cane.  He does not drive.  Apparently has not been to the doctor recently per family.   ?Patient has a sister that lives next door, they also are not close and don't speak often.   ? ?RNCM started discussions for plans at discharge, patient not medially stable, he may likely need short term rehab. ?Family is okay with whatever he needs they will be present to support. ? ?TOC will follow.  ? ? ? ?Expected Discharge Plan: [place] ?Barriers to Discharge: Continued Medical Work up ? ? ?Patient Goals and CMS Choice ?Patient states their goals for this hospitalization and ongoing recovery are:: Patient unable to state at this time but family is thinking he may need long term care ?CMS Medicare.gov Compare Post Acute Care list provided to:: Patient ?Choice offered to / list presented to : Patient, Adult Children ? ?Expected Discharge Plan and Services ?Expected Discharge Plan: [place] ?  ?Discharge  Planning Services: CM Consult ?Post Acute Care Choice: [place] ?Living arrangements for the past 2 months: [place] ?                ?DME Arranged: N/A ?DME Agency: NA ?  ?  ?  ?HH Arranged: NA ?  ?  ?  ?  ? ?Prior Living Arrangements/Services ?Living arrangements for the past 2 months: [place] ?Lives with:: Self ?Patient language and need for interpreter reviewed:: Yes ?Do you feel safe going back to the place where you live?: Yes      ?Need for Family Participation in Patient Care: Yes (Comment) ?Care giver support system in place?: Yes (comment) (daughters) ?Current home services: DME Kasandra Knudsen) ?Criminal Activity/Legal Involvement Pertinent to Current Situation/Hospitalization: No - Comment as needed ? ?Activities of Daily Living ?Home Assistive Devices/Equipment: None ?ADL Screening (condition at time of admission) ?Patient's cognitive ability adequate to safely complete daily activities?: Yes ?Is the patient deaf or have difficulty hearing?: No ?Does the patient have difficulty seeing, even when wearing glasses/contacts?: No ?Does the patient have difficulty concentrating, remembering, or making decisions?: No ?Patient able to express need for assistance with ADLs?: Yes ?Does the patient have difficulty dressing or bathing?: Yes ?Independently performs ADLs?: No ?Does the patient have difficulty walking or climbing stairs?: Yes ?Weakness of Legs: Both ?Weakness of Arms/Hands: Both ? ?Permission Sought/Granted ?Permission sought to share information with : Case Manager, Family Supports, Other (  comment) ?Permission granted to share information with : Yes, Verbal Permission Granted ? Share Information with NAME: Jaiel Saraceno ? Permission granted to share info w AGENCY: SNFs ? Permission granted to share info w Relationship: daughter ? Permission granted to share info w Contact Information: [phone] ? ?Emotional Assessment ?Appearance:: Appears stated  age ?Attitude/Demeanor/Rapport: Lethargic ?Affect (typically observed): Unable to Assess ?Orientation: : Oriented to Self ?Alcohol / Substance Use: Not Applicable ?Psych Involvement: No (comment) ? ?Admission diagnosis:  Hyperkalemia [E87.5] ?Urinary retention [R33.9] ?Dysarthria [R47.1] ?Symptomatic anemia [D64.9] ?Acute renal failure, unspecified acute renal failure type ([place]) [N17.9] ?Patient Active Problem List  ? Diagnosis Date Noted  ? Symptomatic anemia secondary to penile bleed 07/19/2021  ? Bleeding of penis likely d/t hx radiation cystitis (hx prostate CA), possible prostate CA recurrence  07/19/2021  ? Dysarthria 07/19/2021  ? Stroke determined by clinical assessment ([place]) 07/19/2021  ? Acute renal failure ([place]) 07/19/2021  ? Hyperkalemia 07/19/2021  ? Hypoxia, improved 07/19/2021  ? NSTEMI (non-ST elevated myocardial infarction) ([place]) 07/19/2021  ? Acute respiratory failure with hypoxia ([place])   ? B12 deficiency 04/29/2020  ? HSV (herpes simplex virus) infection 05/10/2019  ? Allergic rhinitis 02/14/2019  ? Advanced care planning/counseling discussion 11/29/2016  ? GERD (gastroesophageal reflux disease) 11/05/2014  ? Hyperlipidemia associated with type 2 diabetes mellitus ([place]) 11/05/2014  ? Chronic radiation cystitis 11/05/2014  ? Hypertension associated with diabetes ([place]) 02/02/2013  ? Type 2 diabetes mellitus with hyperglycemia ([place]) 02/02/2013  ? History of prostate cancer 02/02/2013  ? ?PCP:  Jon Billings, NP ?Pharmacy:   ?[facility] [place] - [address] ?[address] ?[place] [address] ?Phone: [phone] Fax: [phone] ? ? ? ? ?Social Determinants of Health (SDOH) Interventions ?  ? ?Readmission Risk Interventions ?   ? View : No data to display.  ?  ?  ?  ? ? ? ?

## 2021-07-20 NOTE — Progress Notes (Signed)
I returned to the bedside at midday today to reassess the patient's urine effluent off CBI. Urine is yellow with some blood product sediment but without clots. Will keep CBI off for now; ok to resume if urine darkens or he begins passing clots. OK to manually irrigate catheter to clear clots as needed. Will reassess in the morning. ? ?Debroah Loop, PA-C ?07/20/21 ?1:11 PM ? ?

## 2021-07-20 NOTE — Progress Notes (Signed)
Notified ICU MD regarding lactic acid this am. No additional orders. Hinton Dyer NP notified of patients temp 100.3, calcium and potassium level. She will review chart and place orders. Continue to assess.  ?

## 2021-07-20 NOTE — Progress Notes (Signed)
Urology Inpatient Progress Note ? ?Subjective: ?Patient remains critically ill in the ICU. ?Hemoglobin up today, 9.8.  Creatinine down today, 5.11. ?Foley catheter in place draining clear, pink urine without clots on slow drip CBI. ?Patient briefly awakens but is unable to contribute to HPI today.  Daughters are present at the bedside. ? ?Anti-infectives: ?Anti-infectives (From admission, onward)  ? ? Start     Dose/Rate Route Frequency Ordered Stop  ? 07/19/21 2000  cefTRIAXone (ROCEPHIN) 2 g in sodium chloride 0.9 % 100 mL IVPB       ? 2 g ?200 mL/hr over 30 Minutes Intravenous Every 24 hours 07/19/21 1854    ? ?  ? ? ?Current Facility-Administered Medications  ?Medication Dose Route Frequency Provider Last Rate Last Admin  ? 0.9 %  sodium chloride infusion   Intravenous PRN Flora Lipps, MD   Stopped at 07/19/21 2257  ? cefTRIAXone (ROCEPHIN) 2 g in sodium chloride 0.9 % 100 mL IVPB  2 g Intravenous Q24H Flora Lipps, MD   Stopped at 07/19/21 2115  ? chlorhexidine (PERIDEX) 0.12 % solution 15 mL  15 mL Mouth Rinse BID Emeterio Reeve, DO   15 mL at 07/20/21 7322  ? Chlorhexidine Gluconate Cloth 2 % PADS 6 each  6 each Topical G2542 Flora Lipps, MD   6 each at 07/19/21 1600  ? insulin aspart (novoLOG) injection 0-9 Units  0-9 Units Subcutaneous Q4H Emeterio Reeve, DO   5 Units at 07/20/21 0730  ? lactated ringers infusion   Intravenous Continuous Emeterio Reeve, DO 125 mL/hr at 07/20/21 0700 Infusion Verify at 07/20/21 0700  ? MEDLINE mouth rinse  15 mL Mouth Rinse q12n4p Emeterio Reeve, DO      ? sodium chloride irrigation 0.9 % 3,000 mL  3,000 mL Irrigation Continuous Vanessa [place], MD   3,000 mL at 07/20/21 7062  ? ?Objective: ?Vital signs in last 24 hours: ?Temp:  [97.2 ?F (36.2 ?C)-101.7 ?F (38.7 ?C)] 99.2 ?F (37.3 ?C) (04/03 0700) ?Pulse Rate:  [35-116] 88 (04/03 0700) ?Resp:  [15-41] 27 (04/03 0700) ?BP: (86-150)/(13-98) 96/52 (04/03 0700) ?SpO2:  [72 %-100 %] 97 % (04/03 0750) ?FiO2 (%):   [45 %-97 %] 45 % (04/03 0750) ?Weight:  [67.8 kg] 67.8 kg (04/02 2200) ? ?Intake/Output from previous day: ?04/02 0701 - 04/03 0700 ?In: 23992.9 [I.V.:4689.2; Blood:653.3; IV Piggyback:650.4] ?Out: 21225 [Urine:21225] ?Intake/Output this shift: ?No intake/output data recorded. ? ?Physical Exam ?Vitals and nursing note reviewed.  ?Constitutional:   ?   Appearance: He is ill-appearing.  ?   Interventions: Nasal cannula in place.  ?HENT:  ?   Head: Normocephalic and atraumatic.  ?Skin: ?   General: Skin is warm and dry.  ?Neurological:  ?   Mental Status: He is lethargic.  ? ?Lab Results:  ?Recent Labs  ?  07/19/21 ?1053 07/19/21 ?1901 07/19/21 ?2256 07/20/21 ?0424  ?WBC 8.9  --   --  2.7*  ?HGB 7.6*   < > 7.2* 9.8*  ?HCT 24.3*   < > 21.5* 28.5*  ?PLT 146*  --   --  86*  ? < > = values in this interval not displayed.  ? ?BMET ?Recent Labs  ?  07/19/21 ?1053 07/19/21 ?1319 07/19/21 ?2256 07/20/21 ?0424  ?NA 134*  --   --  142  ?K 6.9*   < > 4.2 3.2*  ?CL 103  --   --  91*  ?CO2 10*  --   --  34*  ?GLUCOSE 192*  --   --  260*  ?BUN 71*  --   --  81*  ?CREATININE 6.68*  --   --  5.11*  ?CALCIUM 7.5*  --   --  6.6*  ? < > = values in this interval not displayed.  ? ?PT/INR ?Recent Labs  ?  07/19/21 ?1053  ?LABPROT 22.6*  ?INR 2.0*  ? ?ABG ?Recent Labs  ?  07/19/21 ?1653 07/19/21 ?1820  ?PHART  --  7.33*  ?HCO3 14.6* 13.7*  ? ?Assessment & Plan: ?81 year old male with a history of prostate cancer s/p IMRT with brachytherapy in 2013 currently admitted to the ICU with acute CVA associated with urinary retention and gross hematuria on CBI. ? ?Blood counts are improving and creatinine is downtrending following Foley catheter placement and CBI.  I turned off his CBI this morning given improvement in gross hematuria and will recheck at midday.  We will plan to titrate accordingly to keep this effluent light pink to clear in color. ? ?Recommendations: ?-Restart CBI if urine darkens past pink in color.  Titrate drip to maintain  light pink or clear effluent.  Manually irrigate Foley catheter as needed for clots. ?-Continue Foley catheter with plans for outpatient voiding trial in 2 weeks if patient has meaningful recovery. ?-Outpatient cystoscopy if patient has meaningful recovery. ?-Repeat renal ultrasound tomorrow to reassess hydronephrosis in the setting of Foley catheter placement. ? ?Debroah Loop, PA-C ?07/20/2021  ?

## 2021-07-20 NOTE — Evaluation (Signed)
Clinical/Bedside Swallow Evaluation ?Patient Details  ?Name: Duane Hill. ?MRN: [id number] ?Date of Birth: 05/29/1940 ? ?Today's Date: 07/20/2021 ?Time: SLP Start Time (ACUTE ONLY): 1100 SLP Stop Time (ACUTE ONLY): 1120 ?SLP Time Calculation (min) (ACUTE ONLY): 20 min ? ?Past Medical History:  ?Past Medical History:  ?Diagnosis Date  ? Arthritis   ? Asthma   ? Inhaler as needed  ? Dupuytren contracture   ? GERD (gastroesophageal reflux disease)   ? Hyperlipidemia   ? Hypertension   ? Radiation proctitis   ? Renal insufficiency   ? ?Past Surgical History:  ?Past Surgical History:  ?Procedure Laterality Date  ? PROSTATE SURGERY    ? ?HPI:  ?Per Physician's H&P "Patient presented to ED 07/19/2021 chief complaint of generalized weakness, severe penile bleeding, sudden onset slurred speech difficulty swallowing this morning with last known well the night prior when he went to bed.  Penile bleeding onset was to 4 days ago began worsening and copious bleeding, but no pain, starting 3 days ago.  He has a history of prostate cancer which was treated with radiation and seed placement into the prostate." Head CT on admission "1. UPPER limits normal ventricular size for degree of atrophy. Mild  hydrocephalus is not excluded.  2. No other evidence of acute intracranial abnormality.  3. Atrophy." CXR 07/20/21 "Low lung volumes with mild bibasilar atelectasis."  MRI brain and MRA Head and Neck pending.  ?  ?Assessment / Plan / Recommendation  ?Clinical Impression ? Pt seen for clinical swallowing evaluation. Pt alert. Slow, effortful verbal responses. Multiple family members at bedside. On 45L/min O2 via [place]. Cleared with RN. ? ?Oral motor examination completed. Pt with R facial droop and reduced lingual ROM and strength bilaterally. Pt with inability to perform volitional swallow or cough. ?concomitant oral apraxia. ? ?Pt given trials of ice chips, fed by SLP. Pt presents with s/sx severe oral dysphagia. Pt with limited  mastication/bolus manipulation and inability to transit boluses posteriorly to elicit a pharyngeal swallow. X1 ice chip was absorbed into oral cavity, x1 ice chip was held in anterior buccal cavity and eventually removed from oral cavity by SLP. Pharyngeal swallow could not be clinically assessed given the above. Oral deficits likely due to lingual weakness/incoordination. ? ?At present, a safe oral diet cannot be recommended. Recommend NPO with consideration for short term alternate route of nutrition/hydration/medication. Frequent oral care by nursing staff/trained caregivers.  ? ?Pt is at increased risk for aspiration/aspiration PNA given multiple medical comorbidites (including concern for stroke), respiratory status, and current clinical presentation. ? ?Pt's family, RN, and MD made aware of results, recommendations, and SLP POC. Pt and family also made aware of risks of aspiration/aspiration PNA, rationale for NPO recommendation, changes to swallow function following stroke, relationship between breathing/swallowing as well as increased risks associated with current respiratory demands, and benefits of frequent oral care. Pt nodded in agreement. Family verbalized understanding/agreement.  ? ?SLP to f/u per POC for clinical swallowing re-evaluation.  ? ?SLP Visit Diagnosis: Dysphagia, oropharyngeal phase (R13.12) ?   ?Aspiration Risk ? Severe aspiration risk;Risk for inadequate nutrition/hydration  ?  ?Diet Recommendation NPO;Alternative means - temporary  ? ?Medication Administration: Via alternative means  ?  ?Other  Recommendations Oral Care Recommendations: Oral care QID;Staff/trained caregiver to provide oral care   ? ?Recommendations for follow up therapy are one component of a multi-disciplinary discharge planning process, led by the attending physician.  Recommendations may be updated based on patient status, additional functional criteria and  insurance authorization. ? ?Follow up Recommendations  Skilled nursing-short term rehab (<3 hours/day)  ? ? ?  ?Assistance Recommended at Discharge Frequent or constant Supervision/Assistance  ?Functional Status Assessment Patient has had a recent decline in their functional status and demonstrates the ability to make significant improvements in function in a reasonable and predictable amount of time.  ?Frequency and Duration min 2x/week  ?2 weeks ?  ?   ? ?Prognosis Prognosis for Safe Diet Advancement: Fair ?Barriers to Reach Goals: Language deficits;Severity of deficits  ? ?  ? ?Swallow Study   ?General Date of Onset: 07/19/21 ?HPI: Per [facility]OX[facility] H&P "Patient presented to ED 07/19/2021 chief complaint of generalized weakness, severe penile bleeding, sudden onset slurred speech difficulty swallowing this morning with last known well the night prior when he went to bed.  Penile bleeding onset was to 4 days ago began worsening and copious bleeding, but no pain, starting 3 days ago.  He has a history of prostate cancer which was treated with radiation and seed placement into the prostate." Head CT on admission "1. UPPER limits normal ventricular size for degree of atrophy. Mild  hydrocephalus is not excluded.  2. No other evidence of acute intracranial abnormality.  3. Atrophy." CXR 07/20/21 "Low lung volumes with mild bibasilar atelectasis."  MRI brain and MRA Head and Neck pending. ?Type of Study: Bedside Swallow Evaluation ?Previous Swallow Assessment: unknown ?Diet Prior to this Study: NPO ?Temperature Spikes Noted: Yes ?Respiratory Status: Nasal cannula (45L/min via HFNC) ?History of Recent Intubation: No ?Behavior/Cognition: Alert (slow to respond) ?Oral Cavity Assessment: Dry ?Oral Care Completed by SLP: Yes ?Vision: Functional for self-feeding ?Self-Feeding Abilities: Needs assist ?Patient Positioning: Upright in bed ?Baseline Vocal Quality: Low vocal intensity;Breathy ?Volitional Cough: Cognitively unable to elicit ?Volitional Swallow: Unable to elicit  ?   ?Oral/Motor/Sensory Function Overall Oral Motor/Sensory Function: Severe impairment ?Facial ROM: Reduced right ?Facial Symmetry: Abnormal symmetry right ?Facial Strength: Reduced right ?Lingual ROM: Reduced right;Reduced left ?Lingual Strength: Reduced   ?Ice Chips Ice chips: Impaired ?Oral Phase Impairments: Reduced labial seal;Impaired mastication;Poor awareness of bolus;Reduced lingual movement/coordination ?Oral Phase Functional Implications: Right anterior spillage;Oral holding (pocketing in buccal cavity) ?Pharyngeal Phase Impairments: Unable to trigger swallow   ?Thin Liquid Thin Liquid: Not tested  ?  ?Nectar Thick Nectar Thick Liquid: Not tested   ?Honey Thick Honey Thick Liquid: Not tested   ?Puree Puree: Not tested   ?Solid ? ? ?  Solid: Not tested  ? ?  ?Cherrie Gauze, M.S., CCC-SLP ?Speech-Language Pathologist ?[facility] ?[phone] ([place])  ? ?Quintella Baton ?07/20/2021,12:17 PM ? ? ? ?

## 2021-07-20 NOTE — Evaluation (Signed)
Speech Language Pathology Evaluation ?Patient Details ?Name: Duane Hill. ?MRN: [id number] ?DOB: 10-06-1940 ?Today's Date: 07/20/2021 ?Time: 3299-2426 ?SLP Time Calculation (min) (ACUTE ONLY): 25 min ? ?Problem List:  ?Patient Active Problem List  ? Diagnosis Date Noted  ? Symptomatic anemia secondary to penile bleed 07/19/2021  ? Bleeding of penis likely d/t hx radiation cystitis (hx prostate CA), possible prostate CA recurrence  07/19/2021  ? Dysarthria 07/19/2021  ? Stroke determined by clinical assessment ([place]) 07/19/2021  ? Acute renal failure ([place]) 07/19/2021  ? Hyperkalemia 07/19/2021  ? Hypoxia, improved 07/19/2021  ? NSTEMI (non-ST elevated myocardial infarction) ([place]) 07/19/2021  ? Acute respiratory failure with hypoxia ([place])   ? B12 deficiency 04/29/2020  ? HSV (herpes simplex virus) infection 05/10/2019  ? Allergic rhinitis 02/14/2019  ? Advanced care planning/counseling discussion 11/29/2016  ? GERD (gastroesophageal reflux disease) 11/05/2014  ? Hyperlipidemia associated with type 2 diabetes mellitus ([place]) 11/05/2014  ? Chronic radiation cystitis 11/05/2014  ? Hypertension associated with diabetes ([place]) 02/02/2013  ? Type 2 diabetes mellitus with hyperglycemia ([place]) 02/02/2013  ? History of prostate cancer 02/02/2013  ? ?Past Medical History:  ?Past Medical History:  ?Diagnosis Date  ? Arthritis   ? Asthma   ? Inhaler as needed  ? Dupuytren contracture   ? GERD (gastroesophageal reflux disease)   ? Hyperlipidemia   ? Hypertension   ? Radiation proctitis   ? Renal insufficiency   ? ?Past Surgical History:  ?Past Surgical History:  ?Procedure Laterality Date  ? PROSTATE SURGERY    ? ?HPI:  ?Per Physician's H&P "Patient presented to ED 07/19/2021 chief complaint of generalized weakness, severe penile bleeding, sudden onset slurred speech difficulty swallowing this morning with last known well the night prior when he went to bed.  Penile bleeding onset was to 4 days ago began worsening and copious  bleeding, but no pain, starting 3 days ago.  He has a history of prostate cancer which was treated with radiation and seed placement into the prostate." Head CT on admission "1. UPPER limits normal ventricular size for degree of atrophy. Mild  hydrocephalus is not excluded.  2. No other evidence of acute intracranial abnormality.  3. Atrophy." CXR 07/20/21 "Low lung volumes with mild bibasilar atelectasis."  MRI brain and MRA Head and Neck pending.  ? ?Assessment / Plan / Recommendation ?Clinical Impression ? Pt seen for speech/language evaluation. Pt seen for clinical swallowing evaluation. Pt alert. Slow, effortful verbal responses. Multiple family members at bedside. On 45L/min O2 via [place]. Cleared with RN. ? ?Assessment completed via informal means and selected subtests of Western Aphasia Battery Revised - Bedside Record Form. Pt s/sx dysarthria, suspected oral apraxia, verbal apraxia, and receptive/?expressive language deficits. Pt's speech/expressive language is notable for slow, effortful speech with reduced speech intelligibility due to reduced vocal loudness and imprecise articulation, groping articulatory postures, and reduced length of utterance. Pt speaking mainly in single words and reduced intelligibility was noted with repetition of phrases. Pt with intact confrontation naming for common objects and intact automatic speech for rote counting with extra time. Increased difficulty repeating phrases/sentences of increased length with notable groping articulatory postures (suggestive of verbal apraxia) and reduced speech intelligibility. Pt appears to have intact basic auditory comprehension for simple/1-step commands and basic yes/no questions. Reduced auditory comprehension noted for complex yes/no questions and 2-step (basic and complex) commands. Suspect difficulty following complex conversation as well. Pt oriented to self and place. Full cognitive assessment deferred due to communication deficits.  Further assessment  warranted.  ? ?Based on today's evaluation, anticipate need for post-acute SLP services and frequent/constant supervision.  ? ?Pt and family educated re: possible changes to speech/language following stroke, changes to speech in setting of respiratory distress, and basic communication strategies. Pt, pt's family, and RN educated re: results of today's assessment, SLP recommendations, and SLP POC. Pt nodding in agreement. Family verbalized understanding/agreement. ? ?SLP to f/u per POC for speech/language re-evaluation and speech/language tx as appropraite.  ?  ?SLP Assessment ? SLP Visit Diagnosis: Aphasia (R47.01);Dysarthria and anarthria (R47.1);Apraxia (R48.2)  ?  ?Recommendations for follow up therapy are one component of a multi-disciplinary discharge planning process, led by the attending physician.  Recommendations may be updated based on patient status, additional functional criteria and insurance authorization. ?   ?Follow Up Recommendations ? Skilled nursing-short term rehab (<3 hours/day)  ?  ?Assistance Recommended at Discharge ? Frequent or constant Supervision/Assistance  ?Functional Status Assessment Patient has had a recent decline in their functional status and demonstrates the ability to make significant improvements in function in a reasonable and predictable amount of time.  ?Frequency and Duration min 2x/week  ?  ?  ?   ?SLP Evaluation ?Cognition ? Overall Cognitive Status: Impaired/Different from baseline ?Arousal/Alertness: Awake/alert ?Orientation Level: Oriented to person;Oriented to place  ?  ?   ?Comprehension ? Auditory Comprehension ?Overall Auditory Comprehension: Impaired ?Yes/No Questions: Impaired ?Complex Questions: 25-49% accurate ?Commands: Impaired ?Complex Commands: 0-24% accurate ?Conversation: Simple [facility]) ?Interfering Components: Attention;Hearing ?EffectiveTechniques: Extra processing time;Repetition;Increased volume (short, simple directives) ?Visual  Recognition/Discrimination ?Discrimination: Not tested ?Reading Comprehension ?Reading Status: Not tested  ?  ?Expression Expression ?Primary Mode of Expression: Verbal ?Verbal Expression ?Overall Verbal Expression: Impaired ?Initiation: Impaired ?Automatic Speech:  (1-10 WFL with extra time) ?Level of Generative/Spontaneous Verbalization: Word ?Repetition: Impaired ?Level of Impairment: Phrase level;Sentence level ?Naming: No impairment (common objects 5/5) ?Pragmatics: Impairment ?Impairments: Topic maintenance (due to communication impairment)   ?Oral / Motor ? Oral Motor/Sensory Function ?Overall Oral Motor/Sensory Function: Severe impairment ?Facial ROM: Reduced right ?Facial Symmetry: Abnormal symmetry right ?Facial Strength: Reduced right ?Lingual ROM: Reduced right;Reduced left ?Lingual Strength: Reduced ?Motor Speech ?Overall Motor Speech: Impaired ?Respiration: Impaired ?Level of Impairment: Word ?Phonation: Breathy;Low vocal intensity ?Resonance: Within functional limits ?Articulation: Impaired ?Level of Impairment: Word ?Intelligibility: Intelligibility reduced ?Word: 75-100% accurate ?Phrase: 50-74% accurate ?Motor Planning: Impaired ?Level of Impairment: Phrase ?Motor Speech Errors: Inconsistent;Groping for words ?Effective Techniques: Slow rate;Increased vocal intensity   ?        ?Cherrie Gauze, M.S., CCC-SLP ?Speech-Language Pathologist ?[facility] ?[phone] ([place])  ? ?Quintella Baton ?07/20/2021, 1:03 PM ? ?

## 2021-07-20 NOTE — Progress Notes (Signed)
An USGPIV (ultrasound guided PIV) has been placed for short-term vasopressor infusion. A correctly placed ivWatch must be used when administering Vasopressors. Should this treatment be needed beyond 72 hours, central line access should be obtained.  It will be the responsibility of the bedside nurse to follow best practice to prevent extravasations.   ?

## 2021-07-20 NOTE — Progress Notes (Signed)
Per Dr. Rosita Kea stopped CBI drip 8:30 am. Private messaged MD, urine starting to look light RED and half of a pea size clot. Orders to restart CBI. Continue to monitor.  ?

## 2021-07-20 NOTE — Progress Notes (Signed)
SLP Cancellation Note ? ?Patient Details ?Name: Duane Hill. ?MRN: [id number] ?DOB: 24-Jul-1940 ? ? ?Cancelled treatment:       Reason Eval/Treat Not Completed: Patient not medically ready  ? ?SLP consult received and appreciated. Chart review completed. Per chart review and discussion with RN, pt on 45L/min O2 via [place] (on BiPap overnight) and lethargic. Will defer clinical swallowing evaluation and cognitive-language evaluation at this time. Will monitor for stability in respiratory status and improvements in LOA. ? ?Cherrie Gauze, M.S., CCC-SLP ?Speech-Language Pathologist ?[facility] ?[phone] ([place])  ? ?Quintella Baton ?07/20/2021, 8:58 AM ?

## 2021-07-21 ENCOUNTER — Inpatient Hospital Stay: Payer: Medicare Other

## 2021-07-21 DIAGNOSIS — R310 Gross hematuria: Secondary | ICD-10-CM | POA: Diagnosis not present

## 2021-07-21 DIAGNOSIS — G8191 Hemiplegia, unspecified affecting right dominant side: Secondary | ICD-10-CM | POA: Diagnosis not present

## 2021-07-21 DIAGNOSIS — D696 Thrombocytopenia, unspecified: Secondary | ICD-10-CM

## 2021-07-21 DIAGNOSIS — M6282 Rhabdomyolysis: Secondary | ICD-10-CM | POA: Diagnosis present

## 2021-07-21 DIAGNOSIS — D61818 Other pancytopenia: Secondary | ICD-10-CM

## 2021-07-21 DIAGNOSIS — A419 Sepsis, unspecified organism: Secondary | ICD-10-CM | POA: Diagnosis present

## 2021-07-21 DIAGNOSIS — D649 Anemia, unspecified: Secondary | ICD-10-CM | POA: Diagnosis not present

## 2021-07-21 DIAGNOSIS — R339 Retention of urine, unspecified: Secondary | ICD-10-CM | POA: Diagnosis not present

## 2021-07-21 DIAGNOSIS — R7401 Elevation of levels of liver transaminase levels: Secondary | ICD-10-CM

## 2021-07-21 LAB — TYPE AND SCREEN
ABO/RH(D): A POS
Antibody Screen: NEGATIVE
Unit division: 0
Unit division: 0

## 2021-07-21 LAB — ECHOCARDIOGRAM COMPLETE
AR max vel: 2.1 cm2
AV Area VTI: 2.08 cm2
AV Area mean vel: 1.7 cm2
AV Mean grad: 7 mmHg
AV Peak grad: 11.4 mmHg
Ao pk vel: 1.69 m/s
Area-P 1/2: 2.7 cm2
Height: 68 in
MV VTI: 1.62 cm2
S' Lateral: 3.19 cm
Weight: 2391.55 oz

## 2021-07-21 LAB — URINALYSIS, COMPLETE (UACMP) WITH MICROSCOPIC
RBC / HPF: >50 RBC/hpf — ABNORMAL HIGH (ref 0–5)
Squamous Epithelial / HPF: NONE SEEN (ref 0–5)

## 2021-07-21 LAB — GLUCOSE, CAPILLARY
Glucose-Capillary: 128 mg/dL — ABNORMAL HIGH (ref 70–99)
Glucose-Capillary: 109 mg/dL — ABNORMAL HIGH (ref 70–99)
Glucose-Capillary: 104 mg/dL — ABNORMAL HIGH (ref 70–99)
Glucose-Capillary: 185 mg/dL — ABNORMAL HIGH (ref 70–99)
Glucose-Capillary: 199 mg/dL — ABNORMAL HIGH (ref 70–99)
Glucose-Capillary: 99 mg/dL (ref 70–99)

## 2021-07-21 LAB — PROTIME-INR
INR: 1.4 — ABNORMAL HIGH (ref 0.8–1.2)
Prothrombin Time: 17.1 seconds — ABNORMAL HIGH (ref 11.4–15.2)

## 2021-07-21 LAB — BPAM RBC
Blood Product Expiration Date: 202304252359
Blood Product Expiration Date: 202305052359
ISSUE DATE / TIME: 202304021640
ISSUE DATE / TIME: 202304030048
Unit Type and Rh: 6200
Unit Type and Rh: 6200

## 2021-07-21 LAB — BASIC METABOLIC PANEL
Anion gap: 15 (ref 5–15)
Anion gap: 18 — ABNORMAL HIGH (ref 5–15)
BUN: 77 mg/dL — ABNORMAL HIGH (ref 8–23)
BUN: 73 mg/dL — ABNORMAL HIGH (ref 8–23)
CO2: 29 mmol/L (ref 22–32)
CO2: 27 mmol/L (ref 22–32)
Calcium: 6.5 mg/dL — ABNORMAL LOW (ref 8.9–10.3)
Calcium: 6.8 mg/dL — ABNORMAL LOW (ref 8.9–10.3)
Chloride: 96 mmol/L — ABNORMAL LOW (ref 98–111)
Chloride: 97 mmol/L — ABNORMAL LOW (ref 98–111)
Creatinine, Ser: 3.7 mg/dL — ABNORMAL HIGH (ref 0.61–1.24)
Creatinine, Ser: 3.34 mg/dL — ABNORMAL HIGH (ref 0.61–1.24)
GFR, Estimated: 16 mL/min — ABNORMAL LOW (ref >60)
GFR, Estimated: 18 mL/min — ABNORMAL LOW (ref >60)
Glucose, Bld: 145 mg/dL — ABNORMAL HIGH (ref 70–99)
Glucose, Bld: 132 mg/dL — ABNORMAL HIGH (ref 70–99)
Potassium: 3.2 mmol/L — ABNORMAL LOW (ref 3.5–5.1)
Potassium: 3.1 mmol/L — ABNORMAL LOW (ref 3.5–5.1)
Sodium: 140 mmol/L (ref 135–145)
Sodium: 142 mmol/L (ref 135–145)

## 2021-07-21 LAB — PROCALCITONIN: Procalcitonin: 126.22 ng/mL

## 2021-07-21 LAB — CBC
HCT: 27.3 % — ABNORMAL LOW (ref 39.0–52.0)
Hemoglobin: 9 g/dL — ABNORMAL LOW (ref 13.0–17.0)
MCH: 28.9 pg (ref 26.0–34.0)
MCHC: 33 g/dL (ref 30.0–36.0)
MCV: 87.8 fL (ref 80.0–100.0)
Platelets: 52 10*3/uL — ABNORMAL LOW (ref 150–400)
RBC: 3.11 MIL/uL — ABNORMAL LOW (ref 4.22–5.81)
RDW: 15.1 % (ref 11.5–15.5)
WBC: 3.2 10*3/uL — ABNORMAL LOW (ref 4.0–10.5)
nRBC: 0 % (ref 0.0–0.2)

## 2021-07-21 LAB — FIBRINOGEN: Fibrinogen: >800 mg/dL — ABNORMAL HIGH (ref 210–475)

## 2021-07-21 LAB — HEPATITIS PANEL, ACUTE
HCV Ab: NONREACTIVE
Hep A IgM: NONREACTIVE
Hep B C IgM: NONREACTIVE
Hepatitis B Surface Ag: NONREACTIVE

## 2021-07-21 LAB — CK: Total CK: 3778 U/L — ABNORMAL HIGH (ref 49–397)

## 2021-07-21 LAB — PSA, TOTAL AND FREE
PSA, Free Pct: UNDETERMINED %
PSA, Free: <0.02 ng/mL
Prostate Specific Ag, Serum: <0.1 ng/mL (ref 0.0–4.0)

## 2021-07-21 LAB — POTASSIUM
Potassium: 3.1 mmol/L — ABNORMAL LOW (ref 3.5–5.1)
Potassium: 3.1 mmol/L — ABNORMAL LOW (ref 3.5–5.1)
Potassium: 3.4 mmol/L — ABNORMAL LOW (ref 3.5–5.1)

## 2021-07-21 LAB — HEPATIC FUNCTION PANEL
ALT: 728 U/L — ABNORMAL HIGH (ref 0–44)
AST: 630 U/L — ABNORMAL HIGH (ref 15–41)
Albumin: 2.3 g/dL — ABNORMAL LOW (ref 3.5–5.0)
Alkaline Phosphatase: 43 U/L (ref 38–126)
Bilirubin, Direct: 0.3 mg/dL — ABNORMAL HIGH (ref 0.0–0.2)
Indirect Bilirubin: 0.5 mg/dL (ref 0.3–0.9)
Total Bilirubin: 0.8 mg/dL (ref 0.3–1.2)
Total Protein: 5.3 g/dL — ABNORMAL LOW (ref 6.5–8.1)

## 2021-07-21 LAB — MAGNESIUM: Magnesium: 2.3 mg/dL (ref 1.7–2.4)

## 2021-07-21 MED ORDER — POTASSIUM CHLORIDE 10 MEQ/100ML IV SOLN
10.0000 meq | INTRAVENOUS | Status: DC
  Filled 2021-07-21 (×4): qty 100

## 2021-07-21 MED ORDER — MIDAZOLAM HCL 2 MG/2ML IJ SOLN
1.0000 mg | INTRAMUSCULAR | Status: DC | PRN
  Administered 2021-07-21: 0.5 mg via INTRAVENOUS
  Filled 2021-07-21: qty 2

## 2021-07-21 MED ORDER — POTASSIUM CHLORIDE 10 MEQ/100ML IV SOLN
10.0000 meq | INTRAVENOUS | Status: AC
  Administered 2021-07-21 – 2021-07-22 (×4): 10 meq via INTRAVENOUS
  Filled 2021-07-21 (×4): qty 100

## 2021-07-21 MED ORDER — POTASSIUM CHLORIDE 10 MEQ/100ML IV SOLN
10.0000 meq | INTRAVENOUS | Status: AC
  Administered 2021-07-21 (×4): 10 meq via INTRAVENOUS
  Filled 2021-07-21 (×4): qty 100

## 2021-07-21 MED ORDER — POTASSIUM CHLORIDE 10 MEQ/100ML IV SOLN
10.0000 meq | INTRAVENOUS | Status: DC
  Filled 2021-07-21 (×2): qty 100

## 2021-07-21 MED ORDER — CALCIUM GLUCONATE-NACL 1-0.675 GM/50ML-% IV SOLN: 1.0000 g | Freq: Once | INTRAVENOUS | Status: DC

## 2021-07-21 NOTE — Hospital Course (Signed)
Taken from H&P and ICU notes:  ?Patient presented to ED 07/19/2021 chief complaint of generalized weakness, severe penile bleeding, sudden onset slurred speech difficulty swallowing this morning with last known well the night prior when he went to bed.  Penile bleeding onset was to 4 days ago began worsening and copious bleeding, but no pain, starting 3 days ago.  He has a history of prostate cancer which was treated with radiation and seed placement into the prostate. ?  ?ED course: ... hemoglobin 7.6, thrombocytopenic with platelets 146, AKI/ARF with creatinine 6.68, hyperkalemic with potassium 6.9, troponin 77, AST 129 and ALT 47, EKG showed no significant abnormality, CT head showed no acute findings, CT abdomen/pelvis/chest showed hydroureteronephrosis.  ED was in consultation with neurology, urology, nephrology.  Patient was started on continuous bladder irrigation, treated for hyperkalemia and repeat potassium was improved to 5.3.,  Ordered 1 unit PRBC...  He was initially hypoxic but improved, taken off O2 via [place]. ?  ?Procedures and Significant Results:  ?Continuous bladder irrigation initiated 07/19/21  ?Transfusion 1 unit PRBC's ?CXR 07/19/2021 ?CT head without contrast 07/19/2021: Increased ventricular size with possible mild hydrocephalus, atrophy, atherosclerotic disease noted in carotid arteries ?CT chest/abdomen/pelvis 07/19/2021: Enlarged lymph nodes in chest, hydroureteronephrosis, questionable right lower lung infection ?

## 2021-07-21 NOTE — Plan of Care (Addendum)
Consult for [place] noted. Patient is currently out of room. No family present at bedside. Will follow up at another time.  ?

## 2021-07-21 NOTE — Progress Notes (Signed)
[facility] Encounter Note ? ?Patient: Duane Hill / Admit Date: 07/19/2021 / Date of Encounter: 07/21/2021, 2:00 PM ? ? ?Subjective: ?No significant change in cardiac condition today.  Patient has had an ischemic stroke with right-sided weakness but now is oxygenating better than before.  Bleeding has stopped and the patient has had a reasonable hemoglobin.  The patient's blood pressure is slightly improved.  The patient does have an elevated troponin most consistent with non-ST elevation myocardial infarction more consistent with demand ischemia and multiple issues listed below rather than acute coronary syndrome.  There is no current evidence of congestive heart failure ? ?Echocardiogram showing normal LV systolic function with no evidence of significant valvular heart disease ? ?Review of Systems: ?Positive for: Shortness of breath ?Negative for: Vision change, hearing change, syncope, dizziness, nausea, vomiting,diarrhea, bloody stool, stomach pain, cough, congestion, diaphoresis, urinary frequency, urinary pain,skin lesions, skin rashes ?Others previously listed ? ?Objective: ?Telemetry: Normal sinus rhythm ?Physical Exam: Blood pressure (!) 149/63, pulse 65, temperature 99 ?F (37.2 ?C), resp. rate 17, height '5\' 8"'$  (1.727 m), weight 67.8 kg, SpO2 97 %. Body mass index is 22.73 kg/m?. ?General: Well developed, well nourished, in no acute distress. ?Head: Normocephalic, atraumatic, sclera non-icteric, no xanthomas, nares are without discharge. ?Neck: No apparent masses ?Lungs: Normal respirations with some wheezes, few rhonchi, no rales , no crackles  ? Heart: Regular rate and rhythm, normal S1 S2, no murmur, no rub, no gallop, PMI is normal size and placement, carotid upstroke normal without bruit, jugular venous pressure normal ?Abdomen: Soft, non-tender, non-distended with normoactive bowel sounds. No hepatosplenomegaly. Abdominal aorta is normal size without bruit ?Extremities:  Trace trace edema, no clubbing, no cyanosis, no ulcers,  ?Peripheral: 2+ radial, 2+ femoral, 2+ dorsal pedal pulses ?Neuro: Alert and and not oriented. Moves all extremities spontaneously. ?Psych:  Responds to questions appropriately with a normal affect. ? ? ?Intake/Output Summary (Last 24 hours) at 07/21/2021 1400 ?Last data filed at 07/21/2021 1200 ?Gross per 24 hour  ?Intake 7662.08 ml  ?Output 3950 ml  ?Net 3712.08 ml  ? ? ?Inpatient Medications:  ? chlorhexidine  15 mL Mouth Rinse BID  ? Chlorhexidine Gluconate Cloth  6 each Topical Q0600  ? insulin aspart  0-9 Units Subcutaneous Q4H  ? insulin detemir  7 Units Subcutaneous BID  ? mouth rinse  15 mL Mouth Rinse q12n4p  ? ?Infusions:  ? sodium chloride Stopped (07/20/21 1111)  ? sodium chloride    ? cefTRIAXone (ROCEPHIN)  IV Stopped (07/20/21 2138)  ? lactated ringers 50 mL/hr at 07/21/21 1133  ? potassium chloride 10 mEq (07/21/21 1339)  ? sodium chloride irrigation    ? ? ?Labs: ?Recent Labs  ?  07/20/21 ?0424 07/20/21 ?8469 07/21/21 ?6295 07/21/21 ?2841 07/21/21 ?3244  ?NA 142   < > 140 142  --   ?K 3.2*   < > 3.2* 3.1*  3.1* 3.1*  ?CL 91*   < > 96* 97*  --   ?CO2 34*   < > 29 27  --   ?GLUCOSE 260*   < > 145* 132*  --   ?BUN 81*   < > 77* 73*  --   ?CREATININE 5.11*   < > 3.70* 3.34*  --   ?CALCIUM 6.6*   < > 6.5* 6.8*  --   ?MG 2.3  --   --   --  2.3  ?PHOS 6.4*  --   --   --   --   ? < > =  values in this interval not displayed.  ? ?Recent Labs  ?  07/20/21 ?0424 07/21/21 ?0420  ?AST 1,457*  1,438* 630*  ?ALT 713*  11 728*  ?ALKPHOS 37*  37* 43  ?BILITOT 0.8  0.9 0.8  ?PROT 5.2*  5.2* 5.3*  ?ALBUMIN 2.7*  2.5* 2.3*  ? ?Recent Labs  ?  07/19/21 ?1053 07/19/21 ?1901 07/20/21 ?0424 07/20/21 ?1101 07/20/21 ?1713 07/21/21 ?0420  ?WBC 8.9  --  2.7*  --   --  3.2*  ?NEUTROABS 5.5  --   --   --   --   --   ?HGB 7.6*   < > 9.8*   < > 9.6* 9.0*  ?HCT 24.3*   < > 28.5*   < > 28.0* 27.3*  ?MCV 93.1  --  85.3  --   --  87.8  ?PLT 146*  --  86*  --   --  52*  ? < >  = values in this interval not displayed.  ? ?Recent Labs  ?  07/20/21 ?0424 07/21/21 ?0420  ?CKTOTAL 2,831* 3,778*  ? ?Invalid input(s): POCBNP ?No results for input(s): HGBA1C in the last 72 hours.  ? ?Weights: ?Filed Weights  ? 07/19/21 2200  ?Weight: 67.8 kg  ? ? ? ?Radiology/Studies:  ?CT HEAD WO CONTRAST (5MM) ? ?Result Date: 07/19/2021 ?CLINICAL DATA:  81 year old male with seizure and slurred speech. EXAM: CT HEAD WITHOUT CONTRAST TECHNIQUE: Contiguous axial images were obtained from the base of the skull through the vertex without intravenous contrast. RADIATION DOSE REDUCTION: This exam was performed according to the departmental dose-optimization program which includes automated exposure control, adjustment of the mA and/or kV according to patient size and/or use of iterative reconstruction technique. COMPARISON:  None. FINDINGS: Brain: There is no evidence of acute infarction, extra-axial collection, hemorrhage, midline shift or mass. Atrophy noted. The ventricular system is UPPER limits of normal in size for degree of atrophy. Vascular: Carotid atherosclerotic calcifications are noted. Skull: Normal. Negative for fracture or focal lesion. Sinuses/Orbits: No acute abnormality. Mucous retention cyst/polyp within the RIGHT maxillary sinus and mild mucosal thickening in scattered ethmoid air cells noted. Other: None IMPRESSION: 1. UPPER limits normal ventricular size for degree of atrophy. Mild hydrocephalus is not excluded. 2. No other evidence of acute intracranial abnormality. 3. Atrophy. Electronically Signed   By: Margarette Canada M.D.   On: 07/19/2021 12:53  ? ?MR ANGIO HEAD WO CONTRAST ? ?Result Date: 07/21/2021 ?CLINICAL DATA:  Provided history: Neuro deficit, acute, stroke suspected. Generalized weakness. Sudden onset slurred speech and difficulty swallowing this morning. EXAM: MRI HEAD WITHOUT CONTRAST MRA HEAD WITHOUT CONTRAST MRA NECK WITHOUT CONTRAST TECHNIQUE: Multiplanar, multi-echo pulse sequences of  the brain and surrounding structures were acquired without intravenous contrast. Angiographic images of the Circle of Willis were acquired using MRA technique without intravenous contrast. Angiographic images of the neck were acquired using MRA technique without intravenous contrast. Carotid stenosis measurements (when applicable) are obtained utilizing NASCET criteria, using the distal internal carotid diameter as the denominator. Head COMPARISON:  CT 07/19/2021. FINDINGS: MRI HEAD FINDINGS Brain: Moderate generalized cerebral and cerebellar atrophy. Commensurate prominence of the ventricles and sulci. No cortical encephalomalacia is identified. No significant cerebral white matter disease. There is no acute infarct. No evidence of an intracranial mass. No chronic intracranial blood products. No extra-axial fluid collection. No midline shift. Vascular: Maintained flow voids within the proximal large arterial vessels. Skull and upper cervical spine: No focal suspicious marrow lesion. Sinuses/Orbits: Visualized orbits show  no acute finding. 18 mm mucous retention cyst within the right maxillary sinus. Trace mucosal thickening within the bilateral sphenoid sinuses. Moderate mucosal thickening within the bilateral ethmoid sinuses. MRA HEAD FINDINGS Anterior circulation: The intracranial internal carotid arteries are patent. Mild atherosclerotic irregularity of both vessels without stenosis. The M1 middle cerebral arteries are patent. No M2 proximal branch occlusion or high-grade proximal stenosis is identified. The anterior cerebral arteries are patent. 2 mm inferior projecting vascular protrusion arising from the supraclinoid left ICA, which may reflect an aneurysm or infundibulum. Posterior circulation: The intracranial vertebral arteries are patent. The non dominant right vertebral artery is developmentally diminutive beyond the origin of the right PICA, although patent. The basilar artery is patent. The posterior  cerebral arteries are patent. Fetal origin right posterior cerebral artery. Apparent severe stenosis within a left PCA branch vessel at the P2/P3 junction. Apparent moderate stenosis within a separate left PCA

## 2021-07-21 NOTE — Progress Notes (Signed)
Urology Inpatient Progress Note ? ?Subjective: ?Patient is clinically improving today but remains in the ICU on high flow nasal cannula.  CBI was restarted yesterday afternoon when his gross hematuria worsened. ?Creatinine down today, 3.34.  Hemoglobin stable, 9.0.  Blood cultures pending with no growth at <24 hours.  UA has still not been collected. ?Foley catheter in place draining yellow urine with some blood product sediment on slow drip CBI. ?Patient denies any pain today.  He had some bladder spasms yesterday but these have resolved. ? ?Anti-infectives: ?Anti-infectives (From admission, onward)  ? ? Start     Dose/Rate Route Frequency Ordered Stop  ? 07/19/21 2000  cefTRIAXone (ROCEPHIN) 2 g in sodium chloride 0.9 % 100 mL IVPB       ? 2 g ?200 mL/hr over 30 Minutes Intravenous Every 24 hours 07/19/21 1854    ? ?  ? ? ?Current Facility-Administered Medications  ?Medication Dose Route Frequency Provider Last Rate Last Admin  ? 0.9 %  sodium chloride infusion   Intravenous PRN Flora Lipps, MD   Stopped at 07/20/21 1111  ? 0.9 %  sodium chloride infusion  250 mL Intravenous Continuous Teressa Lower, NP      ? acetaminophen (TYLENOL) suppository 650 mg  650 mg Rectal Q6H PRN Teressa Lower, NP   650 mg at 07/20/21 1808  ? cefTRIAXone (ROCEPHIN) 2 g in sodium chloride 0.9 % 100 mL IVPB  2 g Intravenous Q24H Flora Lipps, MD   Paused at 07/20/21 2138  ? chlorhexidine (PERIDEX) 0.12 % solution 15 mL  15 mL Mouth Rinse BID Emeterio Reeve, DO   15 mL at 07/20/21 2217  ? Chlorhexidine Gluconate Cloth 2 % PADS 6 each  6 each Topical [id number] Flora Lipps, MD   6 each at 07/20/21 1334  ? insulin aspart (novoLOG) injection 0-9 Units  0-9 Units Subcutaneous Q4H Emeterio Reeve, DO   1 Units at 07/21/21 0354  ? insulin detemir (LEVEMIR) injection 7 Units  7 Units Subcutaneous BID Dallie Piles, RPH   7 Units at 07/20/21 2217  ? lactated ringers infusion   Intravenous Continuous Emeterio Reeve, DO 125 mL/hr at  07/21/21 0600 Infusion Verify at 07/21/21 0600  ? MEDLINE mouth rinse  15 mL Mouth Rinse q12n4p Emeterio Reeve, DO   15 mL at 07/20/21 1551  ? midazolam (VERSED) injection 1 mg  1 mg Intravenous Q5 min PRN Maryjane Hurter, MD      ? norepinephrine (LEVOPHED) '4mg'$  in 22m (0.016 mg/mL) premix infusion  2-10 mcg/min Intravenous Titrated NTeressa Lower NP   Stopped at 07/21/21 [phone] ? ondansetron (ZOFRAN) injection 4 mg  4 mg Intravenous Q6H PRN KVal Riles MD      ? potassium chloride 10 mEq in 100 mL IVPB  10 mEq Intravenous Q1 Hr x 4 MDarrick Penna [facility]     ? sodium chloride irrigation 0.9 % 3,000 mL  3,000 mL Irrigation Continuous FVanessa [place] MD   3,000 mL at 07/20/21 2353  ? ?Objective: ?Vital signs in last 24 hours: ?Temp:  [98.9 ?F (37.2 ?C)-100.3 ?F (37.9 ?C)] 98.9 ?F (37.2 ?C) (04/04 0100) ?Pulse Rate:  [48-99] 68 (04/04 0800) ?Resp:  [13-44] 21 (04/04 0800) ?BP: (83-140)/(37-67) 127/53 (04/04 0800) ?SpO2:  [92 %-100 %] 95 % (04/04 0800) ?FiO2 (%):  [35 %] 35 % (04/04 0700) ? ?Intake/Output from previous day: ?04/03 0701 - 04/04 0700 ?In: 13679.8 [I.V.:3729.9; IV Piggyback:950] ?Out: 3750 [[LEXNT:7001]?Intake/Output this  shift: ?No intake/output data recorded. ? ?Physical Exam ?Vitals and nursing note reviewed.  ?Constitutional:   ?   General: He is not in acute distress. ?   Appearance: He is ill-appearing. He is not toxic-appearing or diaphoretic.  ?HENT:  ?   Head: Normocephalic and atraumatic.  ?Skin: ?   General: Skin is warm and dry.  ?Neurological:  ?   Mental Status: He is alert.  ?Psychiatric:     ?   Mood and Affect: Mood normal.     ?   Behavior: Behavior normal.  ? ?Lab Results:  ?Recent Labs  ?  07/20/21 ?0424 07/20/21 ?1101 07/20/21 ?1713 07/21/21 ?0420  ?WBC 2.7*  --   --  3.2*  ?HGB 9.8*   < > 9.6* 9.0*  ?HCT 28.5*   < > 28.0* 27.3*  ?PLT 86*  --   --  52*  ? < > = values in this interval not displayed.  ? ?BMET ?Recent Labs  ?  07/21/21 ?7939 07/21/21 ?0420  ?NA 140 142  ?K  3.2* 3.1*  3.1*  ?CL 96* 97*  ?CO2 29 27  ?GLUCOSE 145* 132*  ?BUN 77* 73*  ?CREATININE 3.70* 3.34*  ?CALCIUM 6.5* 6.8*  ? ?PT/INR ?Recent Labs  ?  07/19/21 ?1053 07/20/21 ?0300  ?LABPROT 22.6* 23.1*  ?INR 2.0* 2.0*  ? ?ABG ?Recent Labs  ?  07/19/21 ?1820 07/20/21 ?9233  ?PHART 7.33*  --   ?HCO3 13.7* 37.3*  ? ?Assessment & Plan: ?81 year old male with a history of prostate cancer s/p IMRT with brachytherapy in 2013 currently admitted to the ICU with acute CVA associated with urinary retention and gross hematuria on CBI. ? ?He is clinically improving today from the urologic perspective.  I turned his CBI off again this morning and will reassess at midday. ? ?Recommendations: ?-Restart CBI if urine darkens past pink in color.  Titrate drip to maintain light pink or clear effluent.  Manually irrigate Foley catheter as needed for clots. ?-Continue Foley catheter with plans for outpatient voiding trial in 2 weeks ?-Outpatient cystoscopy ?-Repeat renal ultrasound today to reassess hydronephrosis in the setting of Foley catheter placement (ordered) ? ?Debroah Loop, PA-C ?07/21/2021  ?

## 2021-07-21 NOTE — Assessment & Plan Note (Addendum)
CK  3778 on admission, improved with IV fluids.  Encourage oral hydration. ?

## 2021-07-21 NOTE — Progress Notes (Signed)
PHARMACY CONSULT NOTE - FOLLOW UP ? ?Pharmacy Consult for Electrolyte Monitoring and Replacement  ? ?Recent Labs: ?Potassium (mmol/L)  ?Date Value  ?07/21/2021 3.1 (L)  ?07/21/2021 3.1 (L)  ?08/16/2014 3.5  ? ?Magnesium (mg/dL)  ?Date Value  ?07/20/2021 2.3  ? ?Calcium (mg/dL)  ?Date Value  ?07/21/2021 6.8 (L)  ? ?Calcium, Total (mg/dL)  ?Date Value  ?08/16/2014 8.9  ? ?Albumin (g/dL)  ?Date Value  ?07/21/2021 2.3 (L)  ?07/01/2021 4.4  ? ?Phosphorus (mg/dL)  ?Date Value  ?07/20/2021 6.4 (H)  ? ?Sodium (mmol/L)  ?Date Value  ?07/21/2021 142  ?07/01/2021 142  ?08/16/2014 140  ? ?Corrected Ca 8.16 mg/dl ? ?Scr 4.07 > 3.34 ? ?Assessment: ?Duane Hill presenting with generalized weakness, slurred speech and severe penile bleeding. Admitted with acute hypoxic respiratory insufficiency with atelectasis, possible UTI, AKI, and urinary retention. PMH prostate cancer which was treated with radiation and seed placement, DM, HTN, CKD (past GFRs ~40 but not formally staged). Pharmacy consulted for the management of electrolytes. ? ?Calcium given 4/3 ? ?Scr 6.68 > 3.34 (BL Scr seems to be 1.55-1.7) ? ?K 3.2 '@0030'$ . Kcl IV 23mq x 4 given. Then, follow up K was ordered for 0800. ? ?Goal of Therapy:  ?Lytes WNL ? ?Plan:  ?Follow up K is 3.1. Give KCl IV 10 meq x 4  ?F/u electrolytes tomorrow AM. ? ? ?CWynelle Cleveland PharmD ?Pharmacy Resident  ?07/21/2021 ?7:44 AM ? ? ?

## 2021-07-21 NOTE — Progress Notes (Signed)
Spoke with [facility] Pharmacist regarding electrolyte replacement. He will place orders. Continue to assess.   ?

## 2021-07-21 NOTE — Progress Notes (Signed)
?   07/21/21 1400  ?Clinical Encounter Type  ?Visited With Patient and family together  ?Visit Type Follow-up  ?Referral From Chaplain  ? ?Chaplain facilitated completion of Power ?

## 2021-07-21 NOTE — Progress Notes (Signed)
?Progress Note ? ? ?Patient: Duane Hill. CHE:[id number] DOB: 11-04-1940 DOA: 07/19/2021     2 ?DOS: the patient was seen and examined on 07/21/2021 ?  ?Brief hospital course: ?Taken from H&P and ICU notes:  ?Patient presented to ED 07/19/2021 chief complaint of generalized weakness, severe penile bleeding, sudden onset slurred speech difficulty swallowing this morning with last known well the night prior when he went to bed.  Penile bleeding onset was to 4 days ago began worsening and copious bleeding, but no pain, starting 3 days ago.  He has a history of prostate cancer which was treated with radiation and seed placement into the prostate. ?  ?ED course: ... hemoglobin 7.6, thrombocytopenic with platelets 146, AKI/ARF with creatinine 6.68, hyperkalemic with potassium 6.9, troponin 77, AST 129 and ALT 47, EKG showed no significant abnormality, CT head showed no acute findings, CT abdomen/pelvis/chest showed hydroureteronephrosis.  ED was in consultation with neurology, urology, nephrology.  Patient was started on continuous bladder irrigation, treated for hyperkalemia and repeat potassium was improved to 5.3.,  Ordered 1 unit PRBC...  He was initially hypoxic but improved, taken off O2 via [place]. ?  ?Procedures and Significant Results:  ?Continuous bladder irrigation initiated 07/19/21  ?Transfusion 1 unit PRBC's ?CXR 07/19/2021 ?CT head without contrast 07/19/2021: Increased ventricular size with possible mild hydrocephalus, atrophy, atherosclerotic disease noted in carotid arteries ?CT chest/abdomen/pelvis 07/19/2021: Enlarged lymph nodes in chest, hydroureteronephrosis, questionable right lower lung infection ? ?Assessment and Plan: ?* Symptomatic anemia secondary to penile bleed ?POA, due to hematuria.    ?Hgb on presentation 07/19/21 was 7.6 ?PRBC transfusion initiated by ED ?--Monitor CBC ?--Transfuse for Hbg>7 or hemodynamic instability with active bleeding ? ?Transaminitis ?Suspected due to shock liver and  rhabdomyolysis.  Ammonia normal. ?--Trend LFT's.   ?--Continue fluids for rhabdo ? ?RUQ U/S on 4/3: ?1. Cholelithiasis without definite evidence of acute cholecystitis. Clinical correlation is suggested. If there is high clinical concern HIDA scan could be considered for further evaluation. 2. Hepatic steatosis ? ?Sepsis ([place]) ?Unclear source, possible UTI. ?UA was never sent. ?Ordered a urine culture. ?Continue empiric Rocephin. ?Follow cultures. ? ?RUQ U/S with cholelithiasis, no acute cholecystitis. Hepatic steatosis. ? ?Rhabdomyolysis ?CK  3778.  Contributes to AKI. ?--Continue IV fluids ?--Trend CK ? ?Pressure injury of skin ?POA.  I agree with the wound description as outlined below. ?--frequent repositioning ?--diligent local wound care ?--monitor closely for signs of infection ? ?Pressure Injury 07/20/21 Sacrum Mid Stage 2 -  Partial thickness loss of dermis presenting as a shallow open injury with a red, pink wound bed without slough. (Active)  ?07/20/21 1124  ?Location: Sacrum  ?Location Orientation: Mid  ?Staging: Stage 2 -  Partial thickness loss of dermis presenting as a shallow open injury with a red, pink wound bed without slough.  ?Wound Description (Comments):   ?Present on Admission:   ? ? ? ? ?NSTEMI (non-ST elevated myocardial infarction) ([place]) ?Type 2 non-stemi due to demand ischemia, not ACS.  No chest pain or acute ECG changes. ?Cardiology consulted. ?Stat ECG and troponin if pt has chest pain. ? ?Acute respiratory failure with hypoxia ([place]) ?POA, improving.  Required HFNC at flow rate as high as 35 l/min.  Today on 6 L/min. ?--Wean O2 as tolerated ?--Keep sats >90% ? ? ?Hypoxia, improved ?See above ? ?Hyperkalemia ?POA, resolved.  Due to AKI. ?Monitor BMP. ? ?Acute renal failure ([place]) ?Multifactorial due to obstructive uropathy, rhabdomyolysis, hypotension. ?--Nephrology consulted ?--PCCM consulted for Levophed (signed off) ?--Foley  with CBI, urology following ?--Avoid nephrotoxic  agents ?--Follow urine output ?--Monitor BMP ?--Maintain MAP>65 ?--Renal dose medications ? ?Stroke determined by clinical assessment [facility]) ?Presented with a right-sided hemiparesis and dysarthria.  Possibly symptomatic hypoperfusion secondary to hypotension, anemia and sepsis.  Deficits improving. ?No stroke seen on MRI.  Severe left PCA stenosis may be culprit lesion (but did not have visual symptoms initially).  ?--ASA when anemia is better ?--Neurology consulted, signed off ?--Maintain BP, MAP>65 ? ?Dysarthria ?POA, improved.  Due to stroke. ?Neurology following. ? ?Bleeding of penis likely d/t hx radiation cystitis (hx prostate CA), possible prostate CA recurrence  ?Urology following - recommendations: ?"-Restart CBI if urine darkens past pink in color.  Titrate drip to maintain light pink or clear effluent.  Manually irrigate Foley catheter as needed for clots. ?-Continue Foley catheter with plans for outpatient voiding trial in 2 weeks ?-Outpatient cystoscopy ?-Repeat renal ultrasound today to reassess hydronephrosis in the setting of Foley catheter placement (ordered)" ? ?Advanced care planning/counseling discussion ?Palliative care consulted. ?Code status is DNR. ?Continue goals of care discussions. ? ?History of prostate cancer ?s/p IMRT with brachytherapy in 2013  ?With radiation cystitis and hematuria currently. ?Outpatient follow up. ? ?Type 2 diabetes mellitus with hyperglycemia ([place]) ?Holding home medications/po meds  ?Sliding scale Novolog  ? ?Chronic radiation cystitis ?Currently with hematuria.  Mgmt as outlined. ?On CBI. ? ?GERD (gastroesophageal reflux disease) ?No s/s GI bleed per family ?Intolerance to protonix ?Holding po home PPI ? ?Hypertension associated with diabetes ([place]) ?With hypotension requiring Levophed in ICU. ?Antihypertensives on hold, resume when indicated.  (Takes Coreg and lisinopril) ? ? ? ? ?  ? ?Subjective: Patient resting in bed with 2 daughters at bedside seen in ICU this  morning.  He reports overall feeling better.  His speech has improved, as well as his swallowing.  He hopes to eat today after seen by speech therapy again.  He is now able to lift his right arm up against gravity, reports weakness is improved a great deal.  Denies chest pain shortness of breath, fevers chills or any other acute complaints. ? ?Physical Exam: ?Vitals:  ? 07/21/21 1700 07/21/21 1715 07/21/21 1730 07/21/21 1745  ?BP: (!) 114/47 133/70 (!) 128/49 (!) 119/52  ?Pulse: 66     ?Resp: (!) 21 (!) 30 (!) 25 (!) 24  ?Temp:      ?TempSrc:      ?SpO2: 99% 99% 99% 99%  ?Weight:      ?Height:      ? ?General exam: awake, alert, no acute distress ?HEENT: atraumatic, clear conjunctiva, anicteric sclera, moist mucus membranes, hearing grossly normal  ?Respiratory system: CTAB, no wheezes, rales or rhonchi, normal respiratory effort. ?Cardiovascular system: normal S1/S2, RRR, no pedal edema.   ?Gastrointestinal system: soft, NT, ND, no HSM felt, +bowel sounds. ?Central nervous system: A&O x3.  Mild weakness in the right grip strength versus left 4+/5 on right, mildly dysarthric speech ?Extremities: moves all, no edema, normal tone ?Skin: dry, intact, normal temperature ?Psychiatry: normal mood, congruent affect, judgement and insight appear normal ? ? ?Data Reviewed: ? ?Labs reviewed and notable for potassium 3.1, chloride 97, glucose 132, BUN 73, creatinine 3.34, calcium 6.8, anion gap 18, AST 630, ALT 728, total protein 5.3, at bedtime troponin down trended 1475, CBC with WBC 3.2, hemoglobin 9.0, platelets 52, fibrinogen elevated above 800, PT elevated 17.1, INR elevated 1.4, APTT elevated 40 ? ?Family Communication: 2 daughters at bedside on rounds ? ?Disposition: ?Status is: Inpatient ?Remains  inpatient appropriate because: Severity of illness with persistent hematuria, acute renal failure, hypoxic respiratory failure ? ? ? Planned Discharge Destination: Home with Home Health ? ? ? ?Time spent: 45  minutes ? ?Author: ?Ezekiel Slocumb, DO ?07/21/2021 6:59 PM ? ?For on call review [email or web address].  ?

## 2021-07-21 NOTE — Progress Notes (Signed)
Per Urology stopped CBI. Continue to assess.  ?

## 2021-07-21 NOTE — Progress Notes (Signed)
?[facility]  ?ROUNDING NOTE  ? ?Subjective:  ?Patient feeling better today. ?Currently on high flow nasal cannula. ?Creatinine down to 3.3 ?Potassium still a bit low at 3.1. ? ? ?Objective:  ?Vital signs in last 24 hours:  ?Temp:  [98.9 ?F (37.2 ?C)-100.3 ?F (37.9 ?C)] 98.9 ?F (37.2 ?C) (04/04 0100) ?Pulse Rate:  [48-99] 69 (04/04 0700) ?Resp:  [13-44] 22 (04/04 0700) ?BP: (83-140)/(37-67) 123/52 (04/04 0630) ?SpO2:  [92 %-100 %] 95 % (04/04 0700) ?FiO2 (%):  [35 %] 35 % (04/04 0700) ? ?Weight change:  ?Filed Weights  ? 07/19/21 2200  ?Weight: 67.8 kg  ? ? ?Intake/Output: ?I/O last 3 completed shifts: ?In: 30943.9 [I.V.:8033.6; Blood:310; Other:21000; IV Piggyback:1600.3] ?Out: 46803 [[id number]] ?  ?Intake/Output this shift: ? No intake/output data recorded. ? ?Physical Exam: ?General: Critically ill-appearing  ?Head: Normocephalic, atraumatic. Moist oral mucosal membranes  ?Eyes: Anicteric  ?Neck: Supple  ?Lungs:  Scattered rhonchi, on high flow nasal cannula  ?Heart: S1S2 no rubs  ?Abdomen:  Soft, nontender, bowel sounds present  ?Extremities: No peripheral edema.  ?Neurologic: Awake, alert, following commands  ?Skin: No acute rash  ?Access: No hemodialysis access  ? ? ?Basic Metabolic Panel: ?Recent Labs  ?Lab 07/20/21 ?0424 07/20/21 ?3704 07/20/21 ?1446 07/20/21 ?1847 07/20/21 ?2245 07/21/21 ?8889 07/21/21 ?0420  ?NA 142  --  141 141  --  140 142  ?K 3.2*   < > 3.1* 3.1* 3.1* 3.2* 3.1*  3.1*  ?CL 91*  --  96* 94*  --  96* 97*  ?CO2 34*  --  33* 28  --  29 27  ?GLUCOSE 260*  --  138* 145*  --  145* 132*  ?BUN 81*  --  79* 77*  --  77* 73*  ?CREATININE 5.11*  --  4.44* 4.07*  --  3.70* 3.34*  ?CALCIUM 6.6*  --  6.2* 6.4*  --  6.5* 6.8*  ?MG 2.3  --   --   --   --   --   --   ?PHOS 6.4*  --   --   --   --   --   --   ? < > = values in this interval not displayed.  ? ? ?Liver Function Tests: ?Recent Labs  ?Lab 07/19/21 ?1053 07/20/21 ?0424 07/21/21 ?0420  ?AST 129* 1,457*  1,438* 630*  ?ALT 47*  713*  11 728*  ?ALKPHOS 41 37*  37* 43  ?BILITOT 1.0 0.8  0.9 0.8  ?PROT 5.8* 5.2*  5.2* 5.3*  ?ALBUMIN 3.3* 2.7*  2.5* 2.3*  ? ?Recent Labs  ?Lab 07/20/21 ?0424  ?LIPASE 16  ? ?Recent Labs  ?Lab 07/20/21 ?1694  ?AMMONIA 28  ? ? ?CBC: ?Recent Labs  ?Lab 07/19/21 ?1053 07/19/21 ?1901 07/19/21 ?2256 07/20/21 ?0424 07/20/21 ?1101 07/20/21 ?1713 07/21/21 ?0420  ?WBC 8.9  --   --  2.7*  --   --  3.2*  ?NEUTROABS 5.5  --   --   --   --   --   --   ?HGB 7.6*   < > 7.2* 9.8* 9.5* 9.6* 9.0*  ?HCT 24.3*   < > 21.5* 28.5* 27.4* 28.0* 27.3*  ?MCV 93.1  --   --  85.3  --   --  87.8  ?PLT 146*  --   --  86*  --   --  52*  ? < > = values in this interval not displayed.  ? ? ?Cardiac Enzymes: ?Recent Labs  ?  Lab 07/20/21 ?0424 07/21/21 ?0420  ?CKTOTAL 4,034* 3,778*  ? ? ?BNP: ?Invalid input(s): POCBNP ? ?CBG: ?Recent Labs  ?Lab 07/20/21 ?1508 07/20/21 ?1915 07/20/21 ?2312 07/21/21 ?7425 07/21/21 ?0740  ?GLUCAP 150* 140* 146* 128* 109*  ? ? ?Microbiology: ?Results for orders placed or performed during the hospital encounter of 07/19/21  ?Resp Panel by RT-PCR (Flu A&B, Covid) Nasopharyngeal Swab     Status: None  ? Collection Time: 07/19/21 10:54 AM  ? Specimen: Nasopharyngeal Swab; Nasopharyngeal(NP) swabs in vial transport medium  ?Result Value Ref Range Status  ? SARS Coronavirus 2 by RT PCR NEGATIVE NEGATIVE Final  ?  Comment: (NOTE) ?SARS-CoV-2 target nucleic acids are NOT DETECTED. ? ?The SARS-CoV-2 RNA is generally detectable in upper respiratory ?specimens during the acute phase of infection. The lowest ?concentration of SARS-CoV-2 viral copies this assay can detect is ?138 copies/mL. A negative result does not preclude SARS-Cov-2 ?infection and should not be used as the sole basis for treatment or ?other patient management decisions. A negative result may occur with  ?improper specimen collection/handling, submission of specimen other ?than nasopharyngeal swab, presence of viral mutation(s) within the ?areas targeted by  this assay, and inadequate number of viral ?copies(<138 copies/mL). A negative result must be combined with ?clinical observations, patient history, and epidemiological ?information. The expected result is Negative. ? ?Fact Sheet for Patients:  ?EntrepreneurPulse.com.au ? ?Fact Sheet for Healthcare Providers:  ?IncredibleEmployment.be ? ?This test is no t yet approved or cleared by the [place] FDA and  ?has been authorized for detection and/or diagnosis of SARS-CoV-2 by ?FDA under an Emergency Use Authorization (EUA). This EUA will remain  ?in effect (meaning this test can be used) for the duration of the ?COVID-19 declaration under Section 564(b)(1) of the Act, 21 ?U.S.C.section 360bbb-3(b)(1), unless the authorization is terminated  ?or revoked sooner.  ? ? ?  ? Influenza A by PCR NEGATIVE NEGATIVE Final  ? Influenza B by PCR NEGATIVE NEGATIVE Final  ?  Comment: (NOTE) ?The Xpert Xpress SARS-CoV-2/FLU/RSV plus assay is intended as an aid ?in the diagnosis of influenza from Nasopharyngeal swab specimens and ?should not be used as a sole basis for treatment. Nasal washings and ?aspirates are unacceptable for Xpert Xpress SARS-CoV-2/FLU/RSV ?testing. ? ?Fact Sheet for Patients: ?EntrepreneurPulse.com.au ? ?Fact Sheet for Healthcare Providers: ?IncredibleEmployment.be ? ?This test is not yet approved or cleared by the [place] FDA and ?has been authorized for detection and/or diagnosis of SARS-CoV-2 by ?FDA under an Emergency Use Authorization (EUA). This EUA will remain ?in effect (meaning this test can be used) for the duration of the ?COVID-19 declaration under Section 564(b)(1) of the Act, 21 U.S.C. ?section 360bbb-3(b)(1), unless the authorization is terminated or ?revoked. ? ?Performed at [facility], [place], ?[place] [address] ?  ?MRSA Next Gen by PCR, Nasal     Status: None  ? Collection Time: 07/19/21   6:26 PM  ? Specimen: Nasal Mucosa; Nasal Swab  ?Result Value Ref Range Status  ? MRSA by PCR Next Gen NOT DETECTED NOT DETECTED Final  ?  Comment: (NOTE) ?The GeneXpert MRSA Assay (FDA approved for NASAL specimens only), ?is one component of a comprehensive MRSA colonization surveillance ?program. It is not intended to diagnose MRSA infection nor to guide ?or monitor treatment for MRSA infections. ?Test performance is not FDA approved in patients less than 2 years ?old. ?Performed at [facility], [place], ?[place] [address] ?  ?CULTURE, BLOOD (ROUTINE X 2)  w Reflex to ID Panel     Status: None (Preliminary result)  ? Collection Time: 07/20/21  8:31 AM  ? Specimen: BLOOD  ?Result Value Ref Range Status  ? Specimen Description BLOOD BLOOD RIGHT HAND  Final  ? Special Requests   Final  ?  BOTTLES DRAWN AEROBIC AND ANAEROBIC Blood Culture adequate volume  ? Culture   Final  ?  NO GROWTH < 24 HOURS ?Performed at [facility], [address], [place], [place] [address] ?  ? Report Status PENDING  Incomplete  ?CULTURE, BLOOD (ROUTINE X 2) w Reflex to ID Panel     Status: None (Preliminary result)  ? Collection Time: 07/20/21  8:31 AM  ? Specimen: BLOOD  ?Result Value Ref Range Status  ? Specimen Description BLOOD BLOOD LEFT HAND  Final  ? Special Requests   Final  ?  BOTTLES DRAWN AEROBIC AND ANAEROBIC Blood Culture adequate volume  ? Culture   Final  ?  NO GROWTH < 24 HOURS ?Performed at [facility], [address] [place], [place] [address] ?  ? Report Status PENDING  Incomplete  ? ? ?Coagulation Studies: ?Recent Labs  ?  07/19/21 ?1053 07/20/21 ?2841  ?LABPROT 22.6* 23.1*  ?INR 2.0* 2.0*  ? ? ?Urinalysis: ?No results for input(s): COLORURINE, LABSPEC, Jette, GLUCOSEU, HGBUR, BILIRUBINUR, KETONESUR, PROTEINUR, UROBILINOGEN, NITRITE, LEUKOCYTESUR in the last 72 hours. ? ?Invalid input(s): APPERANCEUR  ? ? ?Imaging: ?CT HEAD WO CONTRAST (5MM) ? ?Result Date:  07/19/2021 ?CLINICAL DATA:  81 year old male with seizure and slurred speech. EXAM: CT HEAD WITHOUT CONTRAST TECHNIQUE: Contiguous axial images were obtained from the base of the skull through the vertex without intravenous c

## 2021-07-21 NOTE — Assessment & Plan Note (Addendum)
POA, improving.  Suspected due to shock liver and rhabdomyolysis.   ?Ammonia normal. ?Rhabdomyolysis improved with fluids. ?LFTs have been improving steadily ?-- Repeat CMP in 1 to 2 weeks.   ? ?RUQ U/S on 4/3: ?1. Cholelithiasis without definite evidence of acute cholecystitis. Clinical correlation is suggested. If there is high clinical concern HIDA scan could be considered for further evaluation. 2. Hepatic steatosis ?

## 2021-07-21 NOTE — Progress Notes (Signed)
I returned to the bedside at midday today to recheck the patient's urine. Urine remains yellow with blood product sediment; ok to keep CBI off for now. Will keep irrigation fluids hung and reassess in the morning; if urine remains light pink or lighter ok to d/c CBI at that point. ? ?Debroah Loop, PA-C ?07/21/21 ?1:37 PM ? ?

## 2021-07-21 NOTE — Assessment & Plan Note (Addendum)
POA, resolved.   ?Required HFNC at flow rate as high as 35 l/min.   Weaned off oxygen and stable on room air. ? ?

## 2021-07-21 NOTE — Progress Notes (Signed)
Neurology Progress Note ? ?Patient ID: Duane Hill. is a 81 y.o. with PMHx of  has a past medical history of Arthritis, Asthma, Dupuytren contracture, GERD (gastroesophageal reflux disease), Hyperlipidemia, Hypertension, Radiation proctitis, and Renal insufficiency.  He presented with right-sided weakness on awakening as well as dysarthria. Subsequently clinically declined with severe AKI, fever and respiratory distress.  ? ?Major interval events/Subjective: ?Feeling slowly better, dysarthria improving ? ?Exam: ?Current vital signs: ?BP 139/60   Pulse 67   Temp 99 ?F (37.2 ?C)   Resp 16   Ht '5\' 8"'$  (1.727 m)   Wt 67.8 kg   SpO2 99%   BMI 22.73 kg/m?  ?Vital signs in last 24 hours: ?Temp:  [98.9 ?F (37.2 ?C)-100.3 ?F (37.9 ?C)] 99 ?F (37.2 ?C) (04/04 0700) ?Pulse Rate:  [48-93] 67 (04/04 1215) ?Resp:  [16-41] 16 (04/04 1215) ?BP: (83-148)/(37-67) 139/60 (04/04 1215) ?SpO2:  [92 %-99 %] 99 % (04/04 1215) ?FiO2 (%):  [35 %] 35 % (04/04 0700) ? ? ?Gen: In bed, acutely ill appearing ?Resp: High flow nasal cannula in place, nursing reports that he becomes very uncomfortable with being laid flat ?Cardiac: Perfusing extremities well  ?Abd: soft, nt ?GU: Clots in Foley output ? ?Neuro: ?MS: Somnolent, but briefly opens eyes and is able to follow some commands, able to repeat ?CN: EOMI to tracking examiner, continues to have a right facial droop, corneals intact eyelash brush, remainder difficult to assess given his somnolence ?Sensory/motor: Globally weak, does not appear to have focal weakness today.  ? ?Pertinent Labs: ? ? ?Basic Metabolic Panel: ?Recent Labs  ?Lab 07/20/21 ?0424 07/20/21 ?1275 07/20/21 ?1446 07/20/21 ?1847 07/20/21 ?2245 07/21/21 ?1700 07/21/21 ?1749 07/21/21 ?4496  ?NA 142  --  141 141  --  140 142  --   ?K 3.2*   < > 3.1* 3.1* 3.1* 3.2* 3.1*  3.1* 3.1*  ?CL 91*  --  96* 94*  --  96* 97*  --   ?CO2 34*  --  33* 28  --  29 27  --   ?GLUCOSE 260*  --  138* 145*  --  145* 132*  --   ?BUN 81*   --  79* 77*  --  77* 73*  --   ?CREATININE 5.11*  --  4.44* 4.07*  --  3.70* 3.34*  --   ?CALCIUM 6.6*  --  6.2* 6.4*  --  6.5* 6.8*  --   ?MG 2.3  --   --   --   --   --   --  2.3  ?PHOS 6.4*  --   --   --   --   --   --   --   ? < > = values in this interval not displayed.  ? ? ? ?CBC: ?Recent Labs  ?Lab 07/19/21 ?1053 07/19/21 ?1901 07/19/21 ?2256 07/20/21 ?0424 07/20/21 ?1101 07/20/21 ?1713 07/21/21 ?0420  ?WBC 8.9  --   --  2.7*  --   --  3.2*  ?NEUTROABS 5.5  --   --   --   --   --   --   ?HGB 7.6*   < > 7.2* 9.8* 9.5* 9.6* 9.0*  ?HCT 24.3*   < > 21.5* 28.5* 27.4* 28.0* 27.3*  ?MCV 93.1  --   --  85.3  --   --  87.8  ?PLT 146*  --   --  86*  --   --  52*  ? < > =  values in this interval not displayed.  ? ? ? ?Coagulation Studies: ?Recent Labs  ?  07/19/21 ?1053 07/20/21 ?9169  ?LABPROT 22.6* 23.1*  ?INR 2.0* 2.0*  ? ?  ? ? ?Lab Results  ?Component Value Date  ? HGBA1C 6.3 (H) 07/01/2021  ? ?Lab Results  ?Component Value Date  ? CHOL 135 07/01/2021  ? HDL 25 (L) 07/01/2021  ? [place] 73 07/01/2021  ? TRIG 219 (H) 07/01/2021  ? CHOLHDL 5.4 (H) 07/01/2021  ? ? ?Personally reviewed imaging: ? ?MRI Brain: ?  ?1. No evidence of acute intracranial abnormality. ?2. Moderate generalized cerebral and cerebellar atrophy. ?3. Paranasal sinus disease, as described. ?  ?MRA head: ?  ?1. No intracranial large vessel occlusion. ?2. Intracranial atherosclerotic disease, most notably as follows. ?3. Apparent severe stenosis within a left PCA branch vessel at the ?P2/P3 junction. ?4. Apparent moderate stenosis within a separate left PCA branch ?vessel at the P2/P3 junction. ?5. 2 mm inferiorly projecting vascular protrusion arising from the ?supraclinoid left ICA, which may reflect an aneurysm or ?infundibulum. ?  ?MRA neck: ?  ?Motion degradation and non-contrast technique limit evaluation of ?the proximal common carotid arteries and proximal V1 vertebral ?arteries. Within this limitation, the common carotid, internal ?carotid  and vertebral arteries are patent within the neck without ?hemodynamically significant stenosis. ? ?ECHO:  ? 1. Left ventricular ejection fraction, by estimation, is 60 to 65%. The  ?left ventricle has normal function. The left ventricle has no regional  ?wall motion abnormalities. Left ventricular diastolic parameters were  ?normal.  ? 2. Right ventricular systolic function is normal. The right ventricular  ?size is normal.  ? 3. The mitral valve is normal in structure. Mild mitral valve  ?regurgitation.  ? 4. The aortic valve is normal in structure. Aortic valve regurgitation is  ?not visualized.  ? ?Impression: 81 year old presented with a pure motor hemiparesis, aspirin held secondary to hemoglobin of 7.6 initially. Possibly symptomatic hypoperfusion secondary to anemia and sepsis, now improving without evidence of brain injury on MRI, fortunately. Severe left PCA stenosis may be culprit lesion though interesting that he did not have visual symptoms initially.  ? ?Recommendations: ?-Start aspirin when safe to do so from primary team's perspective, 81 mg aspirin for ICAD, not urgent given no stroke ?-Discussed neurological prognosis with family at bedside again ?-Neurology will be available on an as needed basis, please reconsult if new questions arise.  ? ? ?Lesleigh Noe MD-PhD ?[facility] ?[phone]  ?[facility] coverage for [facility] is from 8 AM to 4 AM in-house and 4 PM to 8 PM by telephone/video. 8 PM to 8 AM emergent questions or overnight urgent questions should be addressed to Teleneurology On-call or [facility] neurohospitalist; contact information can be found on AMION ? ?30 min of care today ? ?

## 2021-07-21 NOTE — Progress Notes (Signed)
Potassium was still infusing at the time of labs. Will defer to AM labs. ?

## 2021-07-21 NOTE — Assessment & Plan Note (Signed)
POA.  I agree with the wound description as outlined below. ?--frequent repositioning ?--diligent local wound care ?--monitor closely for signs of infection ? ?Pressure Injury 07/20/21 Sacrum Mid Stage 2 -  Partial thickness loss of dermis presenting as a shallow open injury with a red, pink wound bed without slough. (Active)  ?07/20/21 1124  ?Location: Sacrum  ?Location Orientation: Mid  ?Staging: Stage 2 -  Partial thickness loss of dermis presenting as a shallow open injury with a red, pink wound bed without slough.  ?Wound Description (Comments):   ?Present on Admission:   ? ? ? ?

## 2021-07-21 NOTE — Progress Notes (Addendum)
? ?NAME:  Duane Bordner., MRN:  [id number], DOB:  1941-01-29, LOS: 2 ?ADMISSION DATE:  07/19/2021 ? ? ?History of Present Illness:  ?Patient presented to ED 07/19/2021 chief complaint of generalized weakness, severe penile bleeding, sudden onset slurred speech difficulty swallowing this morning  ?Signs and symptoms of acute CVA ?Penile bleeding onset was to 4 days ago began worsening and copious bleeding,  ?He has a history of prostate cancer which was treated with radiation and seed placement into the prostate. Urology Consulted, Neurology Consulted ? ?ED course: Patient was noted to be anemic with hemoglobin 7.6, thrombocytopenic with platelets 146, AKI/ARF with creatinine 6.68, hyperkalemic with potassium 6.9, troponin 77, AST 129 and ALT 47, EKG showed no significant abnormality,  ? ?CT head showed no acute findings, CT abdomen/pelvis/chest showed hydroureteronephrosis.   ? ?ED was in consultation with neurology, urology, nephrology.  Patient was started on continuous bladder irrigation, treated for hyperkalemia and repeat potassium was improved to 5.3.,   ? ?Ordered 1 unit PRBC which is currently pending as of time of admission.  He was initially hypoxic but improved, taken off O2 via [place]. ? ?Patient subsequently developed acute resp distress ?Looks very ill ?+multiorgan failure ? ?Consultants:  ?Neurology: Dr. Leonel Ramsay ?Urology: Dr. Bernardo Heater ?Nephrology: Dr. Holley Raring  ? ?  ?Procedures and Significant Results:  ?Continuous bladder irrigation initiated 07/19/21  ?Transfusion 1 unit PRBC for 223 ?CXR 07/19/2021 ?CT head without contrast 07/19/2021: Increased ventricular size with possible mild hydrocephalus, atrophy, atherosclerotic disease noted in carotid arteries ?CT chest/abdomen/pelvis 07/19/2021: Enlarged lymph nodes in chest, hydroureteronephrosis ?  ? ?  ? ?Significant Hospital Events: ?Including procedures, antibiotic start and stop dates in addition to other pertinent events   ?Admitted to SD/ICU for further  assessment, PCCM consulted ? ? ? ? ?Subjective/Interval HPI:  ?Back on CBI at end of the day. Much more alert over the course of the night.  ? ? ?Antimicrobials:  ? ?Antibiotics Given (last 72 hours)   ? ? Date/Time Action Medication Dose Rate  ? 07/19/21 2045 New Bag/Given  ? cefTRIAXone (ROCEPHIN) 2 g in sodium chloride 0.9 % 100 mL IVPB 2 g 200 mL/hr  ? 07/20/21 2108 New Bag/Given  ? cefTRIAXone (ROCEPHIN) 2 g in sodium chloride 0.9 % 100 mL IVPB 2 g 200 mL/hr  ? ?  ? ? ? ? ?Objective   ?Blood pressure (!) 123/52, pulse 69, temperature 98.9 ?F (37.2 ?C), temperature source Axillary, resp. rate (!) 22, height '5\' 8"'$  (1.727 m), weight 67.8 kg, SpO2 95 %. ?   ?FiO2 (%):  [35 %] 35 %  ? ?Intake/Output Summary (Last 24 hours) at 07/21/2021 0800 ?Last data filed at 07/21/2021 [phone] ?Gross per 24 hour  ?Intake 13679.81 ml  ?Output 3750 ml  ?Net 9929.81 ml  ? ?Filed Weights  ? 07/19/21 2200  ?Weight: 67.8 kg  ? ? ? ? ?REVIEW OF SYSTEMS ? ?Dysarthria limits ROS ? ? ?PHYSICAL EXAMINATION: ? ?GENERAL:NAD ?EYES: Pupils equal, round, reactive to light.  No scleral icterus.  ?MOUTH: Dry MM ?NECK: Supple.  ?PULMONARY: ctab, equal chest rise, tachypneic ?CARDIOVASCULAR: S1 and S2.  No murmurs  ?GASTROINTESTINAL: Soft, ttp over RUQ, +mildly distended. Positive bowel sounds.  ?MUSCULOSKELETAL: RT chest wall ecchymosis ?NEUROLOGIC: +mouth droop. Dysarthric. Able to follow commands and lift BUE, BLE off the bed  ?SKIN:intact,lukewarm,dry ? ? ? ? ?Labs/imaging that I havepersonally reviewed  ?(right click and "Reselect all SmartList Selections" daily)  ?No new imaging ?K 3.1 ?Bicarb 27 ?BUN 73 ?  Cr 3.34 ?AG 18 ?AST 630 ?Trop peaked ?Procal > 150 ?WBC 23.2 ?Hb 910.9 ?PLT 52 ? ? ?ASSESSMENT AND PLAN ? ?# Acute hypoxic respiratory insufficiency ?With atelectasis on presentation perhaps from splinting related to chest contusion. ?- can trial off HHFNC today ? ?# Troponinemia/NSTEMI ?EKG with ST flattening in lateral leads. Sharp rise in  troponin this AM, EKG unchanged. Unclear if demand in setting critical illness vs stroke vs probably lower suspicion for NSTEMI. ?-f/u TTE ?-consult cardiology but hamstrung with anticoagulation/antiPLT right now and still-resolving AKI precludes cath ? ?# Sepsis ?Unclear source, possible UTI ?- ceftriaxone tentative 7d course ?- RUQ [place] as below ?- BCx, follow and narrow as able ? ?# Acute renal failure ?Definite component of obstructive uropathy but may have been multifactorial with rhabdo as well. ?-stop trending CK ?-continue Foley Catheter for at least 7d per uro ?-Avoid nephrotoxic agents ?-Follow urine output, BMP ?-Ensure adequate renal perfusion, optimize oxygenation ?-Renal dose medications ? ?# Transaminitis ?Unclear cause whether shock vs DILI but no obvious culprit vs muscular injury from rhabdo.  ?- trend LFTs ? ?# Suspected acute pure motor stroke ?- MR brain today ?- holding ASA in setting ABLA, atorvastatin in setting transaminitis ?- neuro following ? ?# Pancytopenia ?Possibly developing in setting sepsis and ABLA.  ?- trend CBC ?- transfuse for Hb 7 ?- PLT transfusion for 50 if active blood loss  ? ? ? ?Best practice (right click and "Reselect all SmartList Selections" daily)  ? ?Per primary ? ?Will sign off, glad to be reinvolved as condition changes ? ?Walker Shadow  ?[place] ? ? ? ? ? ?

## 2021-07-21 NOTE — Assessment & Plan Note (Addendum)
s/p IMRT with brachytherapy in 2013  ?With radiation cystitis and hematuria currently. ?Outpatient follow up with urology in 2 weeks. ?

## 2021-07-21 NOTE — Assessment & Plan Note (Addendum)
Presented with hematuria.   ?Foley in place, now off CBI ?Mgmt as outlined. ?

## 2021-07-21 NOTE — Assessment & Plan Note (Addendum)
Possibly developing in setting sepsis and acute blood loss anemia.  ?Platelet count has improved to 77. ?Hemoglobin stable at 9.3 ?--Repeat CBC in 1 to 2 weeks  ?-- Consider hematology referral for further evaluation as outpatient if persistent ?

## 2021-07-21 NOTE — Progress Notes (Signed)
Speech Language Pathology Treatment: Dysphagia  ?Patient Details ?Name: Duane Hill. ?MRN: [id number] ?DOB: 08/24/1940 ?Today's Date: 07/21/2021 ?Time: 1310-1400 ?SLP Time Calculation (min) (ACUTE ONLY): 50 min ? ?Assessment / Plan / Recommendation ?Clinical Impression ? Pt seen for ongoing assessment of swallowing. He is alert, verbally responsive and engaged in conversation w/ SLP; family present in room. NSG denied any speech-communication deficits this morning during tx time in the room. Pt is on [place] O2 support; wbc wnl. Natural dentition. Much improved presentation today per NSG and Family -- pt/Family stated his verbal communication is "at his baseline"; speech clear and pt followed all instructions and answered question appropriately w/ this Clinician. ? ?Pt explained general aspiration precautions and agreed verbally to the need for following them especially sitting upright for all oral intake -- supported behind the back for full upright sitting. Pt assisted w/ positioning d/t generalized weakness. Post setup, he fed himself trials of ice chips, thin liquids via Cup, purees and moistened solids. NO overt clinical s/s of aspiration were noted w/ any consistency; respiratory status remained calm and unlabored, O2 sats 95-96%, vocal quality clear b/t trials, no cough/throat clearing. Oral phase appeared [facility] for bolus management and timely A-P transfer for swallowing; oral clearing achieved w/ all consistencies.  ?  ?Though acutely ill/hospitalized, pt appears at reduced risk for aspiration w/ oral intake when following general aspiration precautions. Recommend a Regular/mech soft diet for ease of soft foods w/ gravies added to moisten foods; Thin liquids via Cup. Recommend general aspiration precautions; Pills Whole in Puree; tray setup and positioning assistance for meals. ST services will monitor toleration of diet x1 to ensure toleration of diet; then sign off w/ MD to reconsult if needed while admitted.  NSG updated. Precautions posted at bedside. Pt and Family agreed. ? ?  ?HPI HPI: Per 81 H&P "Patient presented to ED 07/19/2021 chief complaint of generalized weakness, severe penile bleeding, sudden onset slurred speech difficulty swallowing this morning with last known well the night prior when he went to bed.  Penile bleeding onset was to 4 days ago began worsening and copious bleeding, but no pain, starting 3 days ago.  He has a history of prostate cancer which was treated with radiation and seed placement into the prostate." Head CT on admission "1. UPPER limits normal ventricular size for degree of atrophy. Mild  hydrocephalus is not excluded.  2. No other evidence of acute intracranial abnormality.  3. Atrophy." CXR 07/20/21 "Low lung volumes with mild bibasilar atelectasis.".  MRI: "No evidence of acute intracranial abnormality.  2. Moderate generalized cerebral and cerebellar atrophy.". ?  ?   ?SLP Plan ? Continue with current plan of care (po check) ? ?  ?  ?Recommendations for follow up therapy are one component of a multi-disciplinary discharge planning process, led by the attending physician.  Recommendations may be updated based on patient status, additional functional criteria and insurance authorization. ?  ? ?Recommendations  ?Diet recommendations: Regular;Thin liquid ?Liquids provided via: Cup;No straw ?Medication Administration: Whole meds with puree ?Supervision: Patient able to self feed;Intermittent supervision to cue for compensatory strategies (setup) ?Compensations: Minimize environmental distractions;Slow rate;Small sips/bites ?Postural Changes and/or Swallow Maneuvers: Out of bed for meals;Seated upright 90 degrees;Upright 30-60 min after meal  ?   ?    ?   ? ? ? ? General recommendations:  (Dietician f/u if needed) ?Oral Care Recommendations: Oral care BID;Oral care before and after PO;Patient independent with oral care (w/ setup) ?Follow Up Recommendations:  No SLP follow  up ?Assistance recommended at discharge: Set up Supervision/Assistance (d/t generalized weakness) ?SLP Visit Diagnosis: Dysphagia, unspecified (R13.10) ?Plan: Continue with current plan of care (po check) ? ? ? ? ?  ?  ? ? ? ? ? ? ?Duane Kenner, MS, CCC-SLP ?Speech Language Pathologist ?Rehab Services; [place] ?[phone] (ascom) ?Duane Hill ? ?07/21/2021, 5:06 PM ?

## 2021-07-21 NOTE — Assessment & Plan Note (Addendum)
RULED OUT.   ?Presented with tachypnea, leukopenia. ?Unclear source, possible UTI but urine culture no growth.  Treated with empiric Rocephin. ?Stop Rocephin. ? ?RUQ U/S with cholelithiasis, no acute cholecystitis. Hepatic steatosis. ?

## 2021-07-22 DIAGNOSIS — R339 Retention of urine, unspecified: Secondary | ICD-10-CM | POA: Diagnosis not present

## 2021-07-22 DIAGNOSIS — Z8546 Personal history of malignant neoplasm of prostate: Secondary | ICD-10-CM | POA: Diagnosis not present

## 2021-07-22 DIAGNOSIS — R310 Gross hematuria: Secondary | ICD-10-CM | POA: Diagnosis not present

## 2021-07-22 DIAGNOSIS — Z7189 Other specified counseling: Secondary | ICD-10-CM | POA: Diagnosis not present

## 2021-07-22 DIAGNOSIS — R531 Weakness: Secondary | ICD-10-CM

## 2021-07-22 DIAGNOSIS — D649 Anemia, unspecified: Secondary | ICD-10-CM | POA: Diagnosis not present

## 2021-07-22 LAB — COMPREHENSIVE METABOLIC PANEL
ALT: 548 U/L — ABNORMAL HIGH (ref 0–44)
AST: 323 U/L — ABNORMAL HIGH (ref 15–41)
Albumin: 2.2 g/dL — ABNORMAL LOW (ref 3.5–5.0)
Alkaline Phosphatase: 51 U/L (ref 38–126)
Anion gap: 10 (ref 5–15)
BUN: 59 mg/dL — ABNORMAL HIGH (ref 8–23)
CO2: 25 mmol/L (ref 22–32)
Calcium: 7.4 mg/dL — ABNORMAL LOW (ref 8.9–10.3)
Chloride: 102 mmol/L (ref 98–111)
Creatinine, Ser: 2.04 mg/dL — ABNORMAL HIGH (ref 0.61–1.24)
GFR, Estimated: 33 mL/min — ABNORMAL LOW (ref >60)
Glucose, Bld: 115 mg/dL — ABNORMAL HIGH (ref 70–99)
Potassium: 3.4 mmol/L — ABNORMAL LOW (ref 3.5–5.1)
Sodium: 137 mmol/L (ref 135–145)
Total Bilirubin: 0.7 mg/dL (ref 0.3–1.2)
Total Protein: 5.4 g/dL — ABNORMAL LOW (ref 6.5–8.1)

## 2021-07-22 LAB — URINE CULTURE: Culture: NO GROWTH

## 2021-07-22 LAB — GLUCOSE, CAPILLARY
Glucose-Capillary: 100 mg/dL — ABNORMAL HIGH (ref 70–99)
Glucose-Capillary: 107 mg/dL — ABNORMAL HIGH (ref 70–99)
Glucose-Capillary: 116 mg/dL — ABNORMAL HIGH (ref 70–99)
Glucose-Capillary: 132 mg/dL — ABNORMAL HIGH (ref 70–99)
Glucose-Capillary: 147 mg/dL — ABNORMAL HIGH (ref 70–99)
Glucose-Capillary: 158 mg/dL — ABNORMAL HIGH (ref 70–99)

## 2021-07-22 LAB — CBC
HCT: 26.7 % — ABNORMAL LOW (ref 39.0–52.0)
Hemoglobin: 8.7 g/dL — ABNORMAL LOW (ref 13.0–17.0)
MCH: 28.6 pg (ref 26.0–34.0)
MCHC: 32.6 g/dL (ref 30.0–36.0)
MCV: 87.8 fL (ref 80.0–100.0)
Platelets: 47 10*3/uL — ABNORMAL LOW (ref 150–400)
RBC: 3.04 MIL/uL — ABNORMAL LOW (ref 4.22–5.81)
RDW: 15 % (ref 11.5–15.5)
WBC: 4.1 10*3/uL (ref 4.0–10.5)
nRBC: 0 % (ref 0.0–0.2)

## 2021-07-22 LAB — POTASSIUM: Potassium: 3.6 mmol/L (ref 3.5–5.1)

## 2021-07-22 LAB — PHOSPHORUS: Phosphorus: 2.8 mg/dL (ref 2.5–4.6)

## 2021-07-22 LAB — CK: Total CK: 1864 U/L — ABNORMAL HIGH (ref 49–397)

## 2021-07-22 MED ORDER — KETOTIFEN FUMARATE 0.025 % OP SOLN
1.0000 [drp] | Freq: Two times a day (BID) | OPHTHALMIC | Status: DC | PRN
  Administered 2021-07-22: 1 [drp] via OPHTHALMIC
  Filled 2021-07-22: qty 5

## 2021-07-22 MED ORDER — POTASSIUM CHLORIDE CRYS ER 20 MEQ PO TBCR
40.0000 meq | EXTENDED_RELEASE_TABLET | ORAL | Status: AC
  Administered 2021-07-22 (×2): 40 meq via ORAL
  Filled 2021-07-22 (×2): qty 2

## 2021-07-22 MED ORDER — POTASSIUM CHLORIDE CRYS ER 20 MEQ PO TBCR
20.0000 meq | EXTENDED_RELEASE_TABLET | Freq: Once | ORAL | Status: AC
  Administered 2021-07-22: 20 meq via ORAL
  Filled 2021-07-22: qty 1

## 2021-07-22 NOTE — Progress Notes (Signed)
PHARMACY CONSULT NOTE - FOLLOW UP ? ?Pharmacy Consult for Electrolyte Monitoring and Replacement  ? ?Recent Labs: ?Potassium (mmol/L)  ?Date Value  ?07/22/2021 3.6  ?08/16/2014 3.5  ? ?Magnesium (mg/dL)  ?Date Value  ?07/21/2021 2.3  ? ?Calcium (mg/dL)  ?Date Value  ?07/22/2021 7.4 (L)  ? ?Calcium, Total (mg/dL)  ?Date Value  ?08/16/2014 8.9  ? ?Albumin (g/dL)  ?Date Value  ?07/22/2021 2.2 (L)  ?07/01/2021 4.4  ? ?Phosphorus (mg/dL)  ?Date Value  ?07/22/2021 2.8  ? ?Sodium (mmol/L)  ?Date Value  ?07/22/2021 137  ?07/01/2021 142  ?08/16/2014 140  ? ?Corrected Ca 8.16 mg/dl ? ?Scr 4.07 > 3.34 > 2.04 (BL Scr seems to be 1.55-1.7) ? ?Assessment: ?80YOM presenting with generalized weakness, slurred speech and severe penile bleeding. Admitted with acute hypoxic respiratory insufficiency with atelectasis, possible UTI, AKI, and urinary retention. PMH prostate cancer which was treated with radiation and seed placement, DM, HTN, CKD (past GFRs ~40 but not formally staged). Pharmacy consulted for the management of electrolytes. ? ?Kcl IV 10 meq x 8 given 4/4.  ? ?Goal of Therapy:  ?Lytes WNL ? ?Plan:  ?4/5 '@1757'$ :  K=3.6 ?Kcl PO 20 meq po x 1 dose ?F/u electrolytes in am ? ? ?Noralee Space, PharmD ?07/22/2021 ?7:24 PM ? ? ?

## 2021-07-22 NOTE — Progress Notes (Addendum)
Speech Language Pathology Treatment: Dysphagia  ?Patient Details ?Name: Duane Hill. ?MRN: [id number] ?DOB: 1941-04-12 ?Today's Date: 07/22/2021 ?Time: 1255-1330 ?SLP Time Calculation (min) (ACUTE ONLY): 35 min ? ?Assessment / Plan / Recommendation ?Clinical Impression ? Pt seen for ongoing assessment of swallowing. Son in law recently visiting. Pt alert, verbally responsive and engaged in conversation w/ SLP; min slowness in general conversation -- suspect Cognitive processing slowing(MRI: "No evidence of acute intracranial abnormality.  2. Moderate generalized cerebral and cerebellar atrophy.".). He continues to improve in presentation -- Son in law stated the conversation b/t them was "good" and that he fully understood him. Pt followed all instructions and answered question appropriately w/ this Clinician. Pt is on [place] O2 support; wbc wnl. ?Noted Palliative Care note today. ?  ?Pt explained general aspiration precautions and agreed verbally to the need for following them especially sitting upright for all oral intake -- he needed min support behind the back for full upright sitting. Pt did not immediately problem-solve and correct his positioning w/out cues/support from this Clinician. Post setup, he fed himself trials of thin liquids via Cup and solids. No overt clinical s/s of aspiration were noted w/ any consistency; respiratory status remained calm and unlabored, O2 sats 96-97%, vocal quality clear b/t trials, no cough/throat clearing. Oral phase appeared [facility] for bolus management and timely A-P transfer for swallowing; oral clearing achieved w/ all consistencies. Reminded pt that when taking small-med size bites, he had more oral control of the bolus -- pt stated he "used to put too much in his mouth" when eating.  ?  ?In setting of illness/hospitalization, pt appears at reduced risk for aspiration w/ oral intake when following general aspiration precautions. Recommend a Regular/mech soft diet for ease of  soft foods w/ gravies added to moisten foods; Thin liquids via Cup. Recommend general aspiration precautions including reducing distractions at meals; Pills Whole in Puree; tray setup and positioning assistance for meals. ST services will sign off for swallowing/dysphagia needs at this time w/ MD to reconsult if new needs while admitted. NSG updated. Precautions posted at bedside. Pt and Family agreed.  ?Of note: spoke w/ Daughter via TC after session re: pt's baseline functioning at home prior to illness/hospitalization. Family does not live in the home w/ pt nor see pt regularly per her report; Dtr had general concerns re: pt's ability to care for himself/home even prior to this hospitalization and noted pt had been having frequent falls, poor care of home itself. Again, unsure if pt has any degree of Cognitive decline per the recent MRI results indicating "No evidence of acute intracranial abnormality.  2. Moderate generalized cerebral and cerebellar atrophy.". Recommend f/u w/ Neurology during/post this admit for formal Cognitive testing in order to best address cognitive communication needs for safety w/ ADLs and hopeful return to living independently(pt's stated goal "to go home and not anywhere else"). Recommend ST f/u at SNF (recommended by PT) post acuity of illness to assess/tx any cognitive communication needs.  ? ? ? ?  ?HPI HPI: Per 80 H&P "Patient presented to ED 07/19/2021 chief complaint of generalized weakness, severe penile bleeding, sudden onset slurred speech difficulty swallowing this morning with last known well the night prior when he went to bed.  Penile bleeding onset was to 4 days ago began worsening and copious bleeding, but no pain, starting 3 days ago.  He has a history of prostate cancer which was treated with radiation and seed placement into the prostate." Head CT  on admission "1. UPPER limits normal ventricular size for degree of atrophy. Mild  hydrocephalus is not excluded.   2. No other evidence of acute intracranial abnormality.  3. Atrophy." CXR 07/20/21 "Low lung volumes with mild bibasilar atelectasis.".  MRI: "No evidence of acute intracranial abnormality.  2. Moderate generalized cerebral and cerebellar atrophy.". ?  ?   ?SLP Plan ? All goals met ? ?  ?  ?Recommendations for follow up therapy are one component of a multi-disciplinary discharge planning process, led by the attending physician.  Recommendations may be updated based on patient status, additional functional criteria and insurance authorization. ?  ? ?Recommendations  ?Diet recommendations: Dysphagia 3 (mechanical soft);Regular;Thin liquid (cut meats, moistened foods) ?Liquids provided via: Cup;No straw ?Medication Administration: Whole meds with puree ?Supervision: Patient able to self feed;Intermittent supervision to cue for compensatory strategies (setup) ?Compensations: Minimize environmental distractions;Slow rate;Small sips/bites ?Postural Changes and/or Swallow Maneuvers: Out of bed for meals;Seated upright 90 degrees;Upright 30-60 min after meal  ?   ?    ?   ? ? ? ? General recommendations:  (dietician f/u as needed) ?Oral Care Recommendations: Oral care BID;Oral care before and after PO;Patient independent with oral care (setup) ?Follow Up Recommendations: No SLP follow up (for swallowing) ?Assistance recommended at discharge: Set up Supervision/Assistance (d/t generalized weakness) ?SLP Visit Diagnosis: Dysphagia, unspecified (R13.10) ?Plan: All goals met ? ? ? ? ?  ?  ? ? ? ? ?Orinda Kenner, MS, CCC-SLP ?Speech Language Pathologist ?Rehab Services; [place] ?[phone] (ascom) ?Sue Mcal[place]ander ? ?07/22/2021, 5:46 PM ?

## 2021-07-22 NOTE — Progress Notes (Signed)
PHARMACY CONSULT NOTE - FOLLOW UP ? ?Pharmacy Consult for Electrolyte Monitoring and Replacement  ? ?Recent Labs: ?Potassium (mmol/L)  ?Date Value  ?07/22/2021 3.4 (L)  ?08/16/2014 3.5  ? ?Magnesium (mg/dL)  ?Date Value  ?07/21/2021 2.3  ? ?Calcium (mg/dL)  ?Date Value  ?07/22/2021 7.4 (L)  ? ?Calcium, Total (mg/dL)  ?Date Value  ?08/16/2014 8.9  ? ?Albumin (g/dL)  ?Date Value  ?07/22/2021 2.2 (L)  ?07/01/2021 4.4  ? ?Phosphorus (mg/dL)  ?Date Value  ?07/22/2021 2.8  ? ?Sodium (mmol/L)  ?Date Value  ?07/22/2021 137  ?07/01/2021 142  ?08/16/2014 140  ? ?Corrected Ca 8.16 mg/dl ? ?Scr 4.07 > 3.34 > 2.04 (BL Scr seems to be 1.55-1.7) ? ?Assessment: ?80YOM presenting with generalized weakness, slurred speech and severe penile bleeding. Admitted with acute hypoxic respiratory insufficiency with atelectasis, possible UTI, AKI, and urinary retention. PMH prostate cancer which was treated with radiation and seed placement, DM, HTN, CKD (past GFRs ~40 but not formally staged). Pharmacy consulted for the management of electrolytes. ? ?Kcl IV 10 meq x 8 given 4/4.  ? ?Goal of Therapy:  ?Lytes WNL ? ?Plan:  ?Kcl PO 40 meq every 4 hours x 2 ?F/u electrolytes this afternoon ? ? ?Wynelle Cleveland, PharmD ?Pharmacy Resident  ?07/22/2021 ?7:35 AM ? ? ?

## 2021-07-22 NOTE — Evaluation (Signed)
Physical Therapy Evaluation ?Patient Details ?Name: Duane Hill. ?MRN: [id number] ?DOB: 01/23/1941 ?Today's Date: 07/22/2021 ? ?History of Present Illness ? Patient is a 81 yo M with PMH that includes HTN, arthritis, GERD, HLD, and radiation proctitis who presented to ED 07/19/2021 chief complaint of generalized weakness, severe penile bleeding, sudden onset slurred speech and difficulty swallowing.  MD assessment includes: Symptomatic anemia secondary to penile bleed, transaminitis, sepsis, and rhabdomyolysis. ?  ?Clinical Impression ? Pt was pleasant and motivated to participate during the session and put forth good effort throughout. Pt required substantial physical assistance with all bed mobility tasks but only min A to come to standing from an elevated EOB. Pt did present with poor static sitting balance initially but balance improved after weight shifting activities.  Once in standing the pt was able to take several steps at the EOB and to the [facility] with min A for stability and to guide the RW.  The pt is at an elevated risk for falls and would not be safe to return to his prior living situation at this time. Pt will benefit from PT services in a SNF setting upon discharge to safely address deficits listed in patient problem list for decreased caregiver assistance and eventual return to PLOF. ? ?   ?   ? ?Recommendations for follow up therapy are one component of a multi-disciplinary discharge planning process, led by the attending physician.  Recommendations may be updated based on patient status, additional functional criteria and insurance authorization. ? ?Follow Up Recommendations Skilled nursing-short term rehab (<3 hours/day) ? ?  ?Assistance Recommended at Discharge Frequent or constant Supervision/Assistance  ?Patient can return home with the following ? Two people to help with walking and/or transfers;Two people to help with bathing/dressing/bathroom;Assistance with cooking/housework;Direct  supervision/assist for medications management;Direct supervision/assist for financial management;Assist for transportation;Help with stairs or ramp for entrance ? ?  ?Equipment Recommendations Rolling walker (2 wheels);BSC/3in1  ?Recommendations for Other Services ?    ?  ?Functional Status Assessment Patient has had a recent decline in their functional status and demonstrates the ability to make significant improvements in function in a reasonable and predictable amount of time.  ? ?  ?Precautions / Restrictions Precautions ?Precautions: Fall ?Restrictions ?Weight Bearing Restrictions: No  ? ?  ? ?Mobility ? Bed Mobility ?Overal bed mobility: Needs Assistance ?Bed Mobility: Rolling, Sidelying to Sit, Sit to Sidelying ?Rolling: Max assist ?Sidelying to sit: Max assist ?  ?  ?Sit to sidelying: Max assist ?General bed mobility comments: Log roll training provided with max A and max verbal cues for sequencing ?  ? ?Transfers ?Overall transfer level: Needs assistance ?Equipment used: Rolling walker (2 wheels) ?Transfers: Sit to/from Stand ?Sit to Stand: Min assist, From elevated surface ?  ?  ?  ?  ?  ?General transfer comment: Min A to come to standing and for stability ?  ? ?Ambulation/Gait ?Ambulation/Gait assistance: Min assist ?Gait Distance (Feet): 3 Feet ?Assistive device: Rolling walker (2 wheels) ?Gait Pattern/deviations: Step-to pattern, Decreased step length - right, Decreased step length - left ?Gait velocity: decreased ?  ?  ?General Gait Details: Pt able to take several steps at the EOB and then to the [facility] with min A for stability and to guide the RW ? ?Stairs ?  ?  ?  ?  ?  ? ?Wheelchair Mobility ?  ? ?Modified Rankin (Stroke Patients Only) ?  ? ?  ? ?Balance Overall balance assessment: Needs assistance, History of Falls ?Sitting-balance support:  Bilateral upper extremity supported, Feet unsupported ?Sitting balance-Leahy Scale: Poor ?  ?Postural control: Right lateral lean ?Standing balance support:  Bilateral upper extremity supported, During functional activity, Reliant on assistive device for balance ?Standing balance-Leahy Scale: Poor ?  ?  ?  ?  ?  ?  ?  ?  ?  ?  ?  ?  ?   ? ? ? ?Pertinent Vitals/Pain Pain Assessment ?Pain Assessment: No/denies pain  ? ? ?Home Living Family/patient expects to be discharged to:: Private residence ?Living Arrangements: Alone ?Available Help at Discharge: Other (Comment) (No assistance available, son is in [place] and sisters both have physical limitations) ?Type of Home: House ?Home Access: Stairs to enter ?Entrance Stairs-Rails: Right;Left;Can reach both ?Entrance Stairs-Number of Steps: 3 ?  ?Home Layout: Two level;Able to live on main level with bedroom/bathroom ?Home Equipment: Kasandra Knudsen - single point ?   ?  ?Prior Function Prior Level of Function : Independent/Modified Independent ?  ?  ?  ?  ?  ?  ?Mobility Comments: Mod Ind amb mostly HH distances, 3-4 falls in the last 6 months secondary to legs buckling ?ADLs Comments: Ind with ADLs, sponge bathes, sister brings groceries and drives pt to apts ?  ? ? ?Hand Dominance  ?   ? ?  ?Extremity/Trunk Assessment  ? Upper Extremity Assessment ?Upper Extremity Assessment: Generalized weakness ?  ? ?Lower Extremity Assessment ?Lower Extremity Assessment: Generalized weakness ?  ? ?   ?Communication  ? Communication: No difficulties  ?Cognition Arousal/Alertness: Awake/alert ?Behavior During Therapy: [facility] for tasks assessed/performed ?Overall Cognitive Status: Within Functional Limits for tasks assessed ?  ?  ?  ?  ?  ?  ?  ?  ?  ?  ?  ?  ?  ?  ?  ?  ?  ?  ?  ? ?  ?General Comments   ? ?  ?Exercises Total Joint Exercises ?Ankle Circles/Pumps: AROM, Strengthening, Both, 10 reps ?Quad Sets: Strengthening, Both, 10 reps ?Hip ABduction/ADduction: AAROM, Strengthening, Both, 5 reps ?Straight Leg Raises: AAROM, Strengthening, Both, 5 reps ?Long Arc Quad: Strengthening, Both, AROM, 10 reps ?Knee Flexion: AROM, Strengthening, Both, 10 reps ?Other  Exercises ?Other Exercises: L lateral weight shifting in sitting to address R lateral lean  ? ?Assessment/Plan  ?  ?PT Assessment Patient needs continued PT services  ?PT Problem List Decreased strength;Decreased activity tolerance;Decreased balance;Decreased mobility;Decreased knowledge of use of DME ? ?   ?  ?PT Treatment Interventions DME instruction;Gait training;Stair training;Functional mobility training;Therapeutic exercise;Therapeutic activities;Balance training;Patient/family education   ? ?PT Goals (Current goals can be found in the Care Plan section)  ?Acute Rehab PT Goals ?Patient Stated Goal: To get stronger ?PT Goal Formulation: With patient ?Time For Goal Achievement: 08/04/21 ?Potential to Achieve Goals: Good ? ?  ?Frequency Min 2X/week ?  ? ? ?Co-evaluation   ?  ?  ?  ?  ? ? ?  ?AM-PAC PT "6 Clicks" Mobility  ?Outcome Measure Help needed turning from your back to your side while in a flat bed without using bedrails?: A Lot ?Help needed moving from lying on your back to sitting on the side of a flat bed without using bedrails?: A Lot ?Help needed moving to and from a bed to a chair (including a wheelchair)?: A Lot ?Help needed standing up from a chair using your arms (e.g., wheelchair or bedside chair)?: A Lot ?Help needed to walk in hospital room?: Total ?Help needed climbing 3-5 steps with a railing? :  Total ?6 Click Score: 10 ? ?  ?End of Session Equipment Utilized During Treatment: Gait belt ?Activity Tolerance: Patient tolerated treatment well ?Patient left: Other (comment);with call bell/phone within reach (Pt left on BSC with call bell in reach, nsg notifed) ?Nurse Communication: Mobility status ?PT Visit Diagnosis: Unsteadiness on feet (R26.81);History of falling (Z91.81);Muscle weakness (generalized) (M62.81);Difficulty in walking, not elsewhere classified (R26.2) ?  ? ?Time: 4604-7998 ?PT Time Calculation (min) (ACUTE ONLY): 33 min ? ? ?Charges:   PT Evaluation ?$PT Eval Moderate  Complexity: 1 Mod ?PT Treatments ?$Therapeutic Exercise: 8-22 mins ?  ?   ? ?D. Royetta Asal PT, DPT ?07/22/21, 4:03 PM ? ? ?

## 2021-07-22 NOTE — Progress Notes (Signed)
Patient alert and oriented x3, NRS, on room air. Patient has some intermittent gargled speech. Regular diet, needs assist with set up. Wound assessed and measured. Report given to floor RN.  ?

## 2021-07-22 NOTE — Assessment & Plan Note (Addendum)
PT/OT evaluated, recommend SNF for rehab.    Patient is medically stable for discharge for ongoing rehab at SNF ?

## 2021-07-22 NOTE — Progress Notes (Signed)
[facility] Encounter Note ? ?Patient: Duane Hill / Admit Date: 07/19/2021 / Date of Encounter: 07/22/2021, 5:50 PM ? ? ?Subjective: ?No significant change in cardiac condition today.  Patient has had an ischemic stroke with right-sided weakness but now is oxygenating better than before.  Bleeding has stopped and the patient has had a reasonable hemoglobin.  The patient's blood pressure is slightly improved.  The patient does have an elevated troponin most consistent with non-ST elevation myocardial infarction more consistent with demand ischemia and multiple issues listed below rather than acute coronary syndrome.  There is no current evidence of congestive heart failure ? ?Echocardiogram showing normal LV systolic function with no evidence of significant valvular heart disease ? ?Review of Systems: ?Positive for: Shortness of breath ?Negative for: Vision change, hearing change, syncope, dizziness, nausea, vomiting,diarrhea, bloody stool, stomach pain, cough, congestion, diaphoresis, urinary frequency, urinary pain,skin lesions, skin rashes ?Others previously listed ? ?Objective: ?Telemetry: Normal sinus rhythm ?Physical Exam: Blood pressure (!) 139/58, pulse 70, temperature (!) 97.4 ?F (36.3 ?C), resp. rate 16, height '5\' 8"'$  (1.727 m), weight 67.8 kg, SpO2 96 %. Body mass index is 22.73 kg/m?. ?General: Well developed, well nourished, in no acute distress. ?Head: Normocephalic, atraumatic, sclera non-icteric, no xanthomas, nares are without discharge. ?Neck: No apparent masses ?Lungs: Normal respirations with some wheezes, few rhonchi, no rales , no crackles  ? Heart: Regular rate and rhythm, normal S1 S2, no murmur, no rub, no gallop, PMI is normal size and placement, carotid upstroke normal without bruit, jugular venous pressure normal ?Abdomen: Soft, non-tender, non-distended with normoactive bowel sounds. No hepatosplenomegaly. Abdominal aorta is normal size without  bruit ?Extremities: Trace trace edema, no clubbing, no cyanosis, no ulcers,  ?Peripheral: 2+ radial, 2+ femoral, 2+ dorsal pedal pulses ?Neuro: Alert and and not oriented. Moves all extremities spontaneously. ?Psych:  Responds to questions appropriately with a normal affect. ? ? ?Intake/Output Summary (Last 24 hours) at 07/22/2021 1750 ?Last data filed at 07/22/2021 1300 ?Gross per 24 hour  ?Intake 979.96 ml  ?Output 2750 ml  ?Net -1770.04 ml  ? ? ? ?Inpatient Medications:  ? chlorhexidine  15 mL Mouth Rinse BID  ? Chlorhexidine Gluconate Cloth  6 each Topical Q0600  ? insulin aspart  0-9 Units Subcutaneous Q4H  ? insulin detemir  7 Units Subcutaneous BID  ? mouth rinse  15 mL Mouth Rinse q12n4p  ? ?Infusions:  ? sodium chloride Stopped (07/20/21 1111)  ? sodium chloride    ? cefTRIAXone (ROCEPHIN)  IV Stopped (07/21/21 2039)  ? lactated ringers Stopped (07/21/21 1441)  ? sodium chloride irrigation    ? ? ?Labs: ?Recent Labs  ?  07/20/21 ?0424 07/20/21 ?2130 07/21/21 ?8657 07/21/21 ?8469 07/21/21 ?1913 07/22/21 ?0429  ?NA 142   < > 142  --   --  137  ?K 3.2*   < > 3.1*  3.1* 3.1* 3.4* 3.4*  ?CL 91*   < > 97*  --   --  102  ?CO2 34*   < > 27  --   --  25  ?GLUCOSE 260*   < > 132*  --   --  115*  ?BUN 81*   < > 73*  --   --  59*  ?CREATININE 5.11*   < > 3.34*  --   --  2.04*  ?CALCIUM 6.6*   < > 6.8*  --   --  7.4*  ?MG 2.3  --   --  2.3  --   --   ?  PHOS 6.4*  --   --   --   --  2.8  ? < > = values in this interval not displayed.  ? ? ?Recent Labs  ?  07/21/21 ?0420 07/22/21 ?0429  ?AST 630* 323*  ?ALT 728* 548*  ?ALKPHOS 43 51  ?BILITOT 0.8 0.7  ?PROT 5.3* 5.4*  ?ALBUMIN 2.3* 2.2*  ? ? ?Recent Labs  ?  07/21/21 ?0420 07/22/21 ?0429  ?WBC 3.2* 4.1  ?HGB 9.0* 8.7*  ?HCT 27.3* 26.7*  ?MCV 87.8 87.8  ?PLT 52* 47*  ? ? ?Recent Labs  ?  07/20/21 ?0424 07/21/21 ?0420 07/22/21 ?0429  ?CKTOTAL 1,497* [id number]* 1,864*  ? ? ?Invalid input(s): POCBNP ?No results for input(s): HGBA1C in the last 72 hours.  ? ?Weights: ?Filed Weights  ?  07/19/21 2200  ?Weight: 67.8 kg  ? ? ? ?Radiology/Studies:  ?CT HEAD WO CONTRAST (5MM) ? ?Result Date: 07/19/2021 ?CLINICAL DATA:  81 year old male with seizure and slurred speech. EXAM: CT HEAD WITHOUT CONTRAST TECHNIQUE: Contiguous axial images were obtained from the base of the skull through the vertex without intravenous contrast. RADIATION DOSE REDUCTION: This exam was performed according to the departmental dose-optimization program which includes automated exposure control, adjustment of the mA and/or kV according to patient size and/or use of iterative reconstruction technique. COMPARISON:  None. FINDINGS: Brain: There is no evidence of acute infarction, extra-axial collection, hemorrhage, midline shift or mass. Atrophy noted. The ventricular system is UPPER limits of normal in size for degree of atrophy. Vascular: Carotid atherosclerotic calcifications are noted. Skull: Normal. Negative for fracture or focal lesion. Sinuses/Orbits: No acute abnormality. Mucous retention cyst/polyp within the RIGHT maxillary sinus and mild mucosal thickening in scattered ethmoid air cells noted. Other: None IMPRESSION: 1. UPPER limits normal ventricular size for degree of atrophy. Mild hydrocephalus is not excluded. 2. No other evidence of acute intracranial abnormality. 3. Atrophy. Electronically Signed   By: Margarette Canada M.D.   On: 07/19/2021 12:53  ? ?MR ANGIO HEAD WO CONTRAST ? ?Result Date: 07/21/2021 ?CLINICAL DATA:  Provided history: Neuro deficit, acute, stroke suspected. Generalized weakness. Sudden onset slurred speech and difficulty swallowing this morning. EXAM: MRI HEAD WITHOUT CONTRAST MRA HEAD WITHOUT CONTRAST MRA NECK WITHOUT CONTRAST TECHNIQUE: Multiplanar, multi-echo pulse sequences of the brain and surrounding structures were acquired without intravenous contrast. Angiographic images of the Circle of Willis were acquired using MRA technique without intravenous contrast. Angiographic images of the neck were  acquired using MRA technique without intravenous contrast. Carotid stenosis measurements (when applicable) are obtained utilizing NASCET criteria, using the distal internal carotid diameter as the denominator. Head COMPARISON:  CT 07/19/2021. FINDINGS: MRI HEAD FINDINGS Brain: Moderate generalized cerebral and cerebellar atrophy. Commensurate prominence of the ventricles and sulci. No cortical encephalomalacia is identified. No significant cerebral white matter disease. There is no acute infarct. No evidence of an intracranial mass. No chronic intracranial blood products. No extra-axial fluid collection. No midline shift. Vascular: Maintained flow voids within the proximal large arterial vessels. Skull and upper cervical spine: No focal suspicious marrow lesion. Sinuses/Orbits: Visualized orbits show no acute finding. 18 mm mucous retention cyst within the right maxillary sinus. Trace mucosal thickening within the bilateral sphenoid sinuses. Moderate mucosal thickening within the bilateral ethmoid sinuses. MRA HEAD FINDINGS Anterior circulation: The intracranial internal carotid arteries are patent. Mild atherosclerotic irregularity of both vessels without stenosis. The M1 middle cerebral arteries are patent. No M2 proximal branch occlusion or high-grade proximal stenosis is identified. The anterior cerebral arteries are  patent. 2 mm inferior projecting vascular protrusion arising from the supraclinoid left ICA, which may reflect an aneurysm or infundibulum. Posterior circulation: The intracranial vertebral arteries are patent. The non dominant right vertebral artery is developmentally diminutive beyond the origin of the right PICA, although patent. The basilar artery is patent. The posterior cerebral arteries are patent. Fetal origin right posterior cerebral artery. Apparent severe stenosis within a left PCA branch vessel at the P2/P3 junction. Apparent moderate stenosis within a separate left PCA branch vessel at  the P2/P3 junction. The left posterior communicating artery is diminutive or absent. Anatomic variants: As described. MRA NECK FINDINGS Aortic arch: Standard aortic branching. Right carotid system: The common carotid and

## 2021-07-22 NOTE — Progress Notes (Signed)
?Progress Note ? ? ?Patient: Duane Hill. EUM:[id number] DOB: 1940-11-08 DOA: 07/19/2021     3 ?DOS: the patient was seen and examined on 07/22/2021 ?  ?Brief hospital course: ?Taken from H&P and ICU notes:  ?Patient presented to ED 07/19/2021 chief complaint of generalized weakness, severe penile bleeding, sudden onset slurred speech difficulty swallowing this morning with last known well the night prior when he went to bed.  Penile bleeding onset was to 4 days ago began worsening and copious bleeding, but no pain, starting 3 days ago.  He has a history of prostate cancer which was treated with radiation and seed placement into the prostate. ?  ?ED course: ... hemoglobin 7.6, thrombocytopenic with platelets 146, AKI/ARF with creatinine 6.68, hyperkalemic with potassium 6.9, troponin 77, AST 129 and ALT 47, EKG showed no significant abnormality, CT head showed no acute findings, CT abdomen/pelvis/chest showed hydroureteronephrosis.  ED was in consultation with neurology, urology, nephrology.  Patient was started on continuous bladder irrigation, treated for hyperkalemia and repeat potassium was improved to 5.3.,  Ordered 1 unit PRBC...  He was initially hypoxic but improved, taken off O2 via [place]. ?  ?Procedures and Significant Results:  ?Continuous bladder irrigation initiated 07/19/21  ?Transfusion 1 unit PRBC's ?CXR 07/19/2021 ?CT head without contrast 07/19/2021: Increased ventricular size with possible mild hydrocephalus, atrophy, atherosclerotic disease noted in carotid arteries ?CT chest/abdomen/pelvis 07/19/2021: Enlarged lymph nodes in chest, hydroureteronephrosis, questionable right lower lung infection ? ?Assessment and Plan: ?* Symptomatic anemia secondary to penile bleed ?POA, due to hematuria.    ?Hgb on presentation 07/19/21 was 7.6 ?PRBC transfusion initiated by ED ?--Monitor CBC ?--Transfuse for Hbg>7 or hemodynamic instability with active bleeding ? ?Generalized weakness ?PT recommending SNF for rehab.    ?TOC following. ?Fall precautions. ? ?Pancytopenia ([place]) ?Possibly developing in setting sepsis and ABLA.  ?- trend CBC ?- transfuse for Hb 7 ?- PLT transfusion for 50 if active blood loss  ? ?Transaminitis ?Suspected due to shock liver and rhabdomyolysis.  Ammonia normal. ?--Trend LFT's.   ?--Continue fluids for rhabdo ? ?RUQ U/S on 4/3: ?1. Cholelithiasis without definite evidence of acute cholecystitis. Clinical correlation is suggested. If there is high clinical concern HIDA scan could be considered for further evaluation. 2. Hepatic steatosis ? ?Sepsis ([place]) ?Unclear source, possible UTI. ?UA was never sent. ?Ordered a urine culture. ?Continue empiric Rocephin. ?Follow cultures. ? ?RUQ U/S with cholelithiasis, no acute cholecystitis. Hepatic steatosis. ? ?Rhabdomyolysis ?CK  3778.  Contributes to AKI. ?--Continue IV fluids ?--Trend CK ? ?Pressure injury of skin ?POA.  I agree with the wound description as outlined below. ?--frequent repositioning ?--diligent local wound care ?--monitor closely for signs of infection ? ?Pressure Injury 07/20/21 Sacrum Mid Stage 2 -  Partial thickness loss of dermis presenting as a shallow open injury with a red, pink wound bed without slough. (Active)  ?07/20/21 1124  ?Location: Sacrum  ?Location Orientation: Mid  ?Staging: Stage 2 -  Partial thickness loss of dermis presenting as a shallow open injury with a red, pink wound bed without slough.  ?Wound Description (Comments):   ?Present on Admission:   ? ? ? ? ?NSTEMI (non-ST elevated myocardial infarction) ([place]) ?Type 2 non-stemi due to demand ischemia, not ACS.  No chest pain or acute ECG changes. ?Cardiology consulted. ?No inpatient cardiac diagnostics recommended. ?Stat ECG and troponin if pt has chest pain. ? ?Acute respiratory failure with hypoxia ([place]) ?POA, improving.  Required HFNC at flow rate as high as 35 l/min.  Today on 6 L/min. ?--Wean O2 as tolerated ?--Keep sats >90% ? ? ?Hypoxia, improved ?See  above ? ?Hyperkalemia ?POA, resolved.  Due to AKI. ?Monitor BMP. ? ?Acute renal failure ([place]) ?Multifactorial due to obstructive uropathy, rhabdomyolysis, hypotension. ?Renal function improving - Cr 2.04 from 3.3 ?--Nephrology consulted ?--PCCM consulted for Levophed (signed off) ?--Foley with CBI, urology following ?--Avoid nephrotoxic agents ?--Follow urine output ?--Monitor BMP ?--Maintain MAP>65 ?--Renal dose medications ? ?Stroke determined by clinical assessment [facility]) ?Presented with a right-sided hemiparesis and dysarthria.  Possibly symptomatic hypoperfusion secondary to hypotension, anemia and sepsis.  Deficits improving. ?No stroke seen on MRI.  Severe left PCA stenosis may be culprit lesion (but did not have visual symptoms initially).  ?--ASA when anemia is better ?--Neurology consulted, signed off ?--Maintain BP, MAP>65 ? ?Dysarthria ?POA, speech has markedly improved.   ?Due to stroke. ?Neurology following. ?SLP cleared for dysphagia 3 diet ? ?Bleeding of penis likely d/t hx radiation cystitis (hx prostate CA), possible prostate CA recurrence  ?Urology following - recommendations: ?"-Manually irrigate Foley catheter as needed for clots ?-Continue Foley catheter with plans for outpatient voiding trial in 2 weeks ?-Outpatient cystoscopy" ? ?Advanced care planning/counseling discussion ?Palliative care consulted. ?Code status is DNR. ?Otherwise full scope of care at this time. ? ?History of prostate cancer ?s/p IMRT with brachytherapy in 2013  ?With radiation cystitis and hematuria currently. ?Outpatient follow up. ? ?Type 2 diabetes mellitus with hyperglycemia ([place]) ?Holding home medications/po meds  ?Sliding scale Novolog  ? ?Chronic radiation cystitis ?Presented with hematuria.   ?Foley in place, now off CBI ?Mgmt as outlined. ? ?GERD (gastroesophageal reflux disease) ?No s/s GI bleed per family ?Intolerance to protonix ?Holding po home PPI ? ?Hypertension associated with diabetes ([place]) ?With  hypotension requiring Levophed in ICU. ?Antihypertensives on hold, resume when indicated.  (Takes Coreg and lisinopril) ? ? ? ? ?  ? ?Subjective: Patient seen awake sitting up in bed in ICU this morning.  He reports overall feeling well.  Speech is further improved.  He feels his right upper extremity strength has returned.  He does feel generally weak however. ? ?Physical Exam: ?Vitals:  ? 07/22/21 1219 07/22/21 1300 07/22/21 1438 07/22/21 1959  ?BP:  (!) 151/59 (!) 139/58 (!) 144/64  ?Pulse: 63 65 70 68  ?Resp: (!) 24 (!) '25 16 15  '$ ?Temp:   (!) 97.4 ?F (36.3 ?C) 98.1 ?F (36.7 ?C)  ?TempSrc:   Oral   ?SpO2: 96% 95% 96% 96%  ?Weight:      ?Height:      ? ?General exam: awake, alert, no acute distress ?HEENT: moist mucus membranes, hearing grossly normal  ?Respiratory system: CTAB, no wheezes, rales or rhonchi, normal respiratory effort. ?Cardiovascular system: normal S1/S2, RRR, no JVD, murmurs, rubs, gallops, no pedal edema.   ?Gastrointestinal system: soft, NT, ND, no HSM felt, +bowel sounds. ?Central nervous system: A&O x3. no gross focal neurologic deficits, normal speech ?Extremities: moves all, no edema, normal tone ?Skin: dry, intact, normal temperature ?Psychiatry: normal mood, congruent affect, judgement and insight appear normal ? ? ?Data Reviewed: ? ?Labs reviewed and notable for potassium 3.4, glucose 115, BUN 59, creatinine 2.04, calcium 7.4, albumin 2.2, AST 323 improving, ALT 548 improving, total protein 5.4, CK18 64 improving, hemoglobin 8.7, platelets 47 relatively stable ? ?Family Communication: None at bedside on rounds today.  2 daughters were at bedside on rounds yesterday ? ?Disposition: ?Status is: Inpatient ?Remains inpatient appropriate because: Severity of illness with persistent AKI, ongoing evaluation not  appropriate for outpatient setting. ? ? ? Planned Discharge Destination: Skilled nursing facility ? ? ? ?Time spent: 35 minutes ? ?Author: ?Ezekiel Slocumb, DO ?07/22/2021 8:31 PM ? ?For  on call review [email or web address].  ?
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
## 2021-07-22 NOTE — Progress Notes (Signed)
?[facility]  ?ROUNDING NOTE  ? ?Subjective:  ? ?Patient sitting up in bed, eating breakfast ?Currently on room air ?Denies pain and discomfort ?States he feels much better today ? ?Potassium 3.4 ?UOP 3.95L in 24 hours ? ?Objective:  ?Vital signs in last 24 hours:  ?Temp:  [98 ?F (36.7 ?C)-100 ?F (37.8 ?C)] 98 ?F (36.7 ?C) (04/05 0815) ?Pulse Rate:  [54-80] 65 (04/05 0900) ?Resp:  [16-30] 23 (04/05 0900) ?BP: (105-153)/(47-73) 141/69 (04/05 0900) ?SpO2:  [93 %-99 %] 95 % (04/05 0900) ?FiO2 (%):  [35 %] 35 % (04/04 1600) ? ?Weight change:  ?Filed Weights  ? 07/19/21 2200  ?Weight: 67.8 kg  ? ? ?Intake/Output: ?I/O last 3 completed shifts: ?In: 7011.2 [I.V.:2088.7; Other:3500; IV Piggyback:1422.6] ?Out: 4900 [Urine:4900] ?  ?Intake/Output this shift: ? Total I/O ?In: 240 [P.O.:240] ?Out: 200 [Urine:200] ? ?Physical Exam: ?General: NAD  ?Head: Normocephalic, atraumatic. Moist oral mucosal membranes  ?Eyes: Anicteric  ?Neck: Supple  ?Lungs:  Mild rhonchi, normal effort, room air  ?Heart: S1S2 no rubs  ?Abdomen:  Soft, nontender, bowel sounds present  ?Extremities: No peripheral edema.  ?Neurologic: Awake, alert, following commands  ?Skin: No acute rash  ?Access: No hemodialysis access  ? ? ?Basic Metabolic Panel: ?Recent Labs  ?Lab 07/20/21 ?0424 07/20/21 ?1610 07/20/21 ?1446 07/20/21 ?1847 07/20/21 ?2245 07/21/21 ?9604 07/21/21 ?5409 07/21/21 ?8119 07/21/21 ?1913 07/22/21 ?0429  ?NA 142  --  141 141  --  140 142  --   --  137  ?K 3.2*   < > 3.1* 3.1*   < > 3.2* 3.1*  3.1* 3.1* 3.4* 3.4*  ?CL 91*  --  96* 94*  --  96* 97*  --   --  102  ?CO2 34*  --  33* 28  --  29 27  --   --  25  ?GLUCOSE 260*  --  138* 145*  --  145* 132*  --   --  115*  ?BUN 81*  --  79* 77*  --  77* 73*  --   --  59*  ?CREATININE 5.11*  --  4.44* 4.07*  --  3.70* 3.34*  --   --  2.04*  ?CALCIUM 6.6*  --  6.2* 6.4*  --  6.5* 6.8*  --   --  7.4*  ?MG 2.3  --   --   --   --   --   --  2.3  --   --   ?PHOS 6.4*  --   --   --   --   --    --   --   --  2.8  ? < > = values in this interval not displayed.  ? ? ? ?Liver Function Tests: ?Recent Labs  ?Lab 07/19/21 ?1053 07/20/21 ?0424 07/21/21 ?0420 07/22/21 ?0429  ?AST 129* 1,457*  1,438* 630* 323*  ?ALT 47* 713*  11 728* 548*  ?ALKPHOS 41 37*  37* 43 51  ?BILITOT 1.0 0.8  0.9 0.8 0.7  ?PROT 5.8* 5.2*  5.2* 5.3* 5.4*  ?ALBUMIN 3.3* 2.7*  2.5* 2.3* 2.2*  ? ? ?Recent Labs  ?Lab 07/20/21 ?0424  ?LIPASE 16  ? ? ?Recent Labs  ?Lab 07/20/21 ?1478  ?AMMONIA 28  ? ? ? ?CBC: ?Recent Labs  ?Lab 07/19/21 ?1053 07/19/21 ?1901 07/20/21 ?0424 07/20/21 ?1101 07/20/21 ?1713 07/21/21 ?2956 07/22/21 ?0429  ?WBC 8.9  --  2.7*  --   --  3.2* 4.1  ?NEUTROABS 5.5  --   --   --   --   --   --   ?  HGB 7.6*   < > 9.8* 9.5* 9.6* 9.0* 8.7*  ?HCT 24.3*   < > 28.5* 27.4* 28.0* 27.3* 26.7*  ?MCV 93.1  --  85.3  --   --  87.8 87.8  ?PLT 146*  --  86*  --   --  52* 47*  ? < > = values in this interval not displayed.  ? ? ? ?Cardiac Enzymes: ?Recent Labs  ?Lab 07/20/21 ?0424 07/21/21 ?0420 07/22/21 ?0429  ?CKTOTAL 5,366* 3,778* 1,864*  ? ? ? ?BNP: ?Invalid input(s): POCBNP ? ?CBG: ?Recent Labs  ?Lab 07/21/21 ?1634 07/21/21 ?2006 07/22/21 ?0011 07/22/21 ?0451 07/22/21 ?4403  ?GLUCAP 199* 99 116* 107* 100*  ? ? ? ?Microbiology: ?Results for orders placed or performed during the hospital encounter of 07/19/21  ?Resp Panel by RT-PCR (Flu A&B, Covid) Nasopharyngeal Swab     Status: None  ? Collection Time: 07/19/21 10:54 AM  ? Specimen: Nasopharyngeal Swab; Nasopharyngeal(NP) swabs in vial transport medium  ?Result Value Ref Range Status  ? SARS Coronavirus 2 by RT PCR NEGATIVE NEGATIVE Final  ?  Comment: (NOTE) ?SARS-CoV-2 target nucleic acids are NOT DETECTED. ? ?The SARS-CoV-2 RNA is generally detectable in upper respiratory ?specimens during the acute phase of infection. The lowest ?concentration of SARS-CoV-2 viral copies this assay can detect is ?138 copies/mL. A negative result does not preclude SARS-Cov-2 ?infection and should  not be used as the sole basis for treatment or ?other patient management decisions. A negative result may occur with  ?improper specimen collection/handling, submission of specimen other ?than nasopharyngeal swab, presence of viral mutation(s) within the ?areas targeted by this assay, and inadequate number of viral ?copies(<138 copies/mL). A negative result must be combined with ?clinical observations, patient history, and epidemiological ?information. The expected result is Negative. ? ?Fact Sheet for Patients:  ?EntrepreneurPulse.com.au ? ?Fact Sheet for Healthcare Providers:  ?IncredibleEmployment.be ? ?This test is no t yet approved or cleared by the [place] FDA and  ?has been authorized for detection and/or diagnosis of SARS-CoV-2 by ?FDA under an Emergency Use Authorization (EUA). This EUA will remain  ?in effect (meaning this test can be used) for the duration of the ?COVID-19 declaration under Section 564(b)(1) of the Act, 21 ?U.S.C.section 360bbb-3(b)(1), unless the authorization is terminated  ?or revoked sooner.  ? ? ?  ? Influenza A by PCR NEGATIVE NEGATIVE Final  ? Influenza B by PCR NEGATIVE NEGATIVE Final  ?  Comment: (NOTE) ?The Xpert Xpress SARS-CoV-2/FLU/RSV plus assay is intended as an aid ?in the diagnosis of influenza from Nasopharyngeal swab specimens and ?should not be used as a sole basis for treatment. Nasal washings and ?aspirates are unacceptable for Xpert Xpress SARS-CoV-2/FLU/RSV ?testing. ? ?Fact Sheet for Patients: ?EntrepreneurPulse.com.au ? ?Fact Sheet for Healthcare Providers: ?IncredibleEmployment.be ? ?This test is not yet approved or cleared by the [place] FDA and ?has been authorized for detection and/or diagnosis of SARS-CoV-2 by ?FDA under an Emergency Use Authorization (EUA). This EUA will remain ?in effect (meaning this test can be used) for the duration of the ?COVID-19 declaration under  Section 564(b)(1) of the Act, 21 U.S.C. ?section 360bbb-3(b)(1), unless the authorization is terminated or ?revoked. ? ?Performed at [facility], [place], ?[place] [address] ?  ?MRSA Next Gen by PCR, Nasal     Status: None  ? Collection Time: 07/19/21  6:26 PM  ? Specimen: Nasal Mucosa; Nasal Swab  ?Result Value Ref Range Status  ? MRSA by PCR Next Gen NOT  DETECTED NOT DETECTED Final  ?  Comment: (NOTE) ?The GeneXpert MRSA Assay (FDA approved for NASAL specimens only), ?is one component of a comprehensive MRSA colonization surveillance ?program. It is not intended to diagnose MRSA infection nor to guide ?or monitor treatment for MRSA infections. ?Test performance is not FDA approved in patients less than 2 years ?old. ?Performed at [facility], [place], ?[place] [address] ?  ?CULTURE, BLOOD (ROUTINE X 2) w Reflex to ID Panel     Status: None (Preliminary result)  ? Collection Time: 07/20/21  8:31 AM  ? Specimen: BLOOD  ?Result Value Ref Range Status  ? Specimen Description BLOOD BLOOD RIGHT HAND  Final  ? Special Requests   Final  ?  BOTTLES DRAWN AEROBIC AND ANAEROBIC Blood Culture adequate volume  ? Culture   Final  ?  NO GROWTH 2 DAYS ?Performed at [facility], [address] [place], [place] [address] ?  ? Report Status PENDING  Incomplete  ?CULTURE, BLOOD (ROUTINE X 2) w Reflex to ID Panel     Status: None (Preliminary result)  ? Collection Time: 07/20/21  8:31 AM  ? Specimen: BLOOD  ?Result Value Ref Range Status  ? Specimen Description BLOOD BLOOD LEFT HAND  Final  ? Special Requests   Final  ?  BOTTLES DRAWN AEROBIC AND ANAEROBIC Blood Culture adequate volume  ? Culture   Final  ?  NO GROWTH 2 DAYS ?Performed at [facility], [address] [place], [place] [address] ?  ? Report Status PENDING  Incomplete  ? ? ?Coagulation Studies: ?Recent Labs  ?  07/19/21 ?1053 07/20/21 ?3716 07/21/21 ?1257  ?LABPROT 22.6* 23.1* 17.1*  ?INR 2.0*  2.0* 1.4*  ? ? ? ?Urinalysis: ?Recent Labs  ?  07/20/21 ?1222  ?COLORURINE RED*  ?LABSPEC TEST NOT REPORTED DUE TO COLOR INTERFERENCE OF URINE PIGMENT  ?PHURINE TEST NOT REPORTED DUE TO COLOR INTERFERENCE OF URINE

## 2021-07-22 NOTE — Progress Notes (Signed)
Urology Consult Follow Up ? ?Subjective: ?Patient resting in bed comfortably.   ? ?Serum creatinine continues to trend downward and is now at 2.04.  Hemoglobin relatively stable at 8.7.   ? ?Urine is yellow/light pink clear.  Urine culture was negative.  ? ?Anti-infectives: ?Anti-infectives (From admission, onward)  ? ? Start     Dose/Rate Route Frequency Ordered Stop  ? 07/19/21 2000  cefTRIAXone (ROCEPHIN) 2 g in sodium chloride 0.9 % 100 mL IVPB       ? 2 g ?200 mL/hr over 30 Minutes Intravenous Every 24 hours 07/19/21 1854    ? ?  ? ? ?Current Facility-Administered Medications  ?Medication Dose Route Frequency Provider Last Rate Last Admin  ? 0.9 %  sodium chloride infusion   Intravenous PRN Flora Lipps, MD   Stopped at 07/20/21 1111  ? 0.9 %  sodium chloride infusion  250 mL Intravenous Continuous Teressa Lower, NP      ? acetaminophen (TYLENOL) suppository 650 mg  650 mg Rectal Q6H PRN Teressa Lower, NP   650 mg at 07/20/21 1808  ? cefTRIAXone (ROCEPHIN) 2 g in sodium chloride 0.9 % 100 mL IVPB  2 g Intravenous Q24H Flora Lipps, MD   Paused at 07/21/21 2039  ? chlorhexidine (PERIDEX) 0.12 % solution 15 mL  15 mL Mouth Rinse BID Emeterio Reeve, DO   15 mL at 07/21/21 2114  ? Chlorhexidine Gluconate Cloth 2 % PADS 6 each  6 each Topical [id number] Flora Lipps, MD   6 each at 07/21/21 1631  ? insulin aspart (novoLOG) injection 0-9 Units  0-9 Units Subcutaneous Q4H Emeterio Reeve, DO   2 Units at 07/21/21 1636  ? insulin detemir (LEVEMIR) injection 7 Units  7 Units Subcutaneous BID Dallie Piles, [facility]   7 Units at 07/21/21 2114  ? lactated ringers infusion   Intravenous Continuous Maryjane Hurter, MD   Stopped at 07/21/21 1441  ? MEDLINE mouth rinse  15 mL Mouth Rinse q12n4p Emeterio Reeve, DO   15 mL at 07/21/21 1632  ? midazolam (VERSED) injection 1 mg  1 mg Intravenous Q5 min PRN Maryjane Hurter, MD   0.5 mg at 07/21/21 1030  ? ondansetron (ZOFRAN) injection 4 mg  4 mg Intravenous Q6H PRN  Val Riles, MD      ? sodium chloride irrigation 0.9 % 3,000 mL  3,000 mL Irrigation Continuous Vanessa [place], MD   3,000 mL at 07/20/21 2353  ? ? ? ?Objective: ?Vital signs in last 24 hours: ?Temp:  [98.5 ?F (36.9 ?C)-100 ?F (37.8 ?C)] 98.7 ?F (37.1 ?C) (04/05 0300) ?Pulse Rate:  [54-80] 54 (04/05 0815) ?Resp:  [16-30] 20 (04/05 0815) ?BP: (105-153)/(47-73) 126/53 (04/05 0815) ?SpO2:  [93 %-99 %] 95 % (04/05 0815) ?FiO2 (%):  [35 %] 35 % (04/04 1600) ? ?Intake/Output from previous day: ?04/04 0701 - 04/05 0700 ?In: 1481 [I.V.:585; IV BJSEGBTDV:761] ?Out: 3950 [YWVPX:1062] ?Intake/Output this shift: ?No intake/output data recorded. ? ? ?Physical Exam ?HENT:  ?   Head: Normocephalic.  ?   Nose: Nose normal.  ?   Comments: Nasal cannula in place ?   Mouth/Throat:  ?   Mouth: Mucous membranes are dry.  ?Eyes:  ?   Pupils: Pupils are equal, round, and reactive to light.  ?Pulmonary:  ?   Effort: Pulmonary effort is normal.  ?Abdominal:  ?   General: Abdomen is flat.  ?   Palpations: Abdomen is soft.  ?Genitourinary: ?  Comments: Foley in place draining very light pink/yellow clear urine ?Skin: ?   General: Skin is warm and dry.  ?Neurological:  ?   General: No focal deficit present.  ?   Mental Status: He is alert and oriented to person, place, and time.  ?Psychiatric:     ?   Mood and Affect: Mood normal.     ?   Behavior: Behavior normal.     ?   Thought Content: Thought content normal.     ?   Judgment: Judgment normal.  ? ? ?Lab Results:  ?Recent Labs  ?  07/21/21 ?0420 07/22/21 ?0429  ?WBC 3.2* 4.1  ?HGB 9.0* 8.7*  ?HCT 27.3* 26.7*  ?PLT 52* 47*  ? ?BMET ?Recent Labs  ?  07/21/21 ?0420 07/21/21 ?0806 07/21/21 ?1913 07/22/21 ?0429  ?NA 142  --   --  137  ?K 3.1*  3.1*   < > 3.4* 3.4*  ?CL 97*  --   --  102  ?CO2 27  --   --  25  ?GLUCOSE 132*  --   --  115*  ?BUN 73*  --   --  59*  ?CREATININE 3.34*  --   --  2.04*  ?CALCIUM 6.8*  --   --  7.4*  ? < > = values in this interval not displayed.  ? ?PT/INR ?Recent  Labs  ?  07/20/21 ?0947 07/21/21 ?1257  ?LABPROT 23.1* 17.1*  ?INR 2.0* 1.4*  ? ?ABG ?Recent Labs  ?  07/19/21 ?1820 07/20/21 ?0962  ?PHART 7.33*  --   ?HCO3 13.7* 37.3*  ? ? ?Studies/Results: ?MR ANGIO HEAD WO CONTRAST ? ?Result Date: 07/21/2021 ?CLINICAL DATA:  Provided history: Neuro deficit, acute, stroke suspected. Generalized weakness. Sudden onset slurred speech and difficulty swallowing this morning. EXAM: MRI HEAD WITHOUT CONTRAST MRA HEAD WITHOUT CONTRAST MRA NECK WITHOUT CONTRAST TECHNIQUE: Multiplanar, multi-echo pulse sequences of the brain and surrounding structures were acquired without intravenous contrast. Angiographic images of the Circle of Willis were acquired using MRA technique without intravenous contrast. Angiographic images of the neck were acquired using MRA technique without intravenous contrast. Carotid stenosis measurements (when applicable) are obtained utilizing NASCET criteria, using the distal internal carotid diameter as the denominator. Head COMPARISON:  CT 07/19/2021. FINDINGS: MRI HEAD FINDINGS Brain: Moderate generalized cerebral and cerebellar atrophy. Commensurate prominence of the ventricles and sulci. No cortical encephalomalacia is identified. No significant cerebral white matter disease. There is no acute infarct. No evidence of an intracranial mass. No chronic intracranial blood products. No extra-axial fluid collection. No midline shift. Vascular: Maintained flow voids within the proximal large arterial vessels. Skull and upper cervical spine: No focal suspicious marrow lesion. Sinuses/Orbits: Visualized orbits show no acute finding. 18 mm mucous retention cyst within the right maxillary sinus. Trace mucosal thickening within the bilateral sphenoid sinuses. Moderate mucosal thickening within the bilateral ethmoid sinuses. MRA HEAD FINDINGS Anterior circulation: The intracranial internal carotid arteries are patent. Mild atherosclerotic irregularity of both vessels without  stenosis. The M1 middle cerebral arteries are patent. No M2 proximal branch occlusion or high-grade proximal stenosis is identified. The anterior cerebral arteries are patent. 2 mm inferior projecting vascular protrusion arising from the supraclinoid left ICA, which may reflect an aneurysm or infundibulum. Posterior circulation: The intracranial vertebral arteries are patent. The non dominant right vertebral artery is developmentally diminutive beyond the origin of the right PICA, although patent. The basilar artery is patent. The posterior cerebral arteries are patent. Fetal origin right posterior  cerebral artery. Apparent severe stenosis within a left PCA branch vessel at the P2/P3 junction. Apparent moderate stenosis within a separate left PCA branch vessel at the P2/P3 junction. The left posterior communicating artery is diminutive or absent. Anatomic variants: As described. MRA NECK FINDINGS Aortic arch: Standard aortic branching. Right carotid system: The common carotid and internal carotid arteries are patent within the neck. Motion degradation and non-contrast technique limit evaluation of the proximal common carotid artery. No hemodynamically significant stenosis elsewhere within the common carotid or cervical internal carotid arteries. Left carotid system: The common carotid and internal carotid arteries are patent within the neck. Motion degradation and noncontrast technique limit evaluation of the proximal common carotid artery. No hemodynamically significant stenosis elsewhere within the common carotid or cervical internal carotid arteries. Vertebral arteries: Vertebral arteries patent within the neck. The left vertebral artery is dominant. Motion degradation and noncontrast technique lim limit its evaluation of the proximal V1 vertebral arteries. No appreciable hemodynamically significant stenosis elsewhere within the cervical vertebral arteries. IMPRESSION: Brain: 1. No evidence of acute intracranial  abnormality. 2. Moderate generalized cerebral and cerebellar atrophy. 3. Paranasal sinus disease, as described. MRA head: 1. No intracranial large vessel occlusion. 2. Intracranial atherosclerotic disease

## 2021-07-23 DIAGNOSIS — D649 Anemia, unspecified: Secondary | ICD-10-CM | POA: Diagnosis not present

## 2021-07-23 DIAGNOSIS — E876 Hypokalemia: Secondary | ICD-10-CM | POA: Diagnosis not present

## 2021-07-23 HISTORY — DX: Other disorders of phosphorus metabolism: E83.39

## 2021-07-23 LAB — GLUCOSE, CAPILLARY
Glucose-Capillary: 123 mg/dL — ABNORMAL HIGH (ref 70–99)
Glucose-Capillary: 124 mg/dL — ABNORMAL HIGH (ref 70–99)
Glucose-Capillary: 76 mg/dL (ref 70–99)
Glucose-Capillary: 87 mg/dL (ref 70–99)
Glucose-Capillary: 92 mg/dL (ref 70–99)
Glucose-Capillary: 99 mg/dL (ref 70–99)

## 2021-07-23 LAB — COMPREHENSIVE METABOLIC PANEL
ALT: 341 U/L — ABNORMAL HIGH (ref 0–44)
AST: 164 U/L — ABNORMAL HIGH (ref 15–41)
Albumin: 2.3 g/dL — ABNORMAL LOW (ref 3.5–5.0)
Alkaline Phosphatase: 67 U/L (ref 38–126)
Anion gap: 7 (ref 5–15)
BUN: 46 mg/dL — ABNORMAL HIGH (ref 8–23)
CO2: 22 mmol/L (ref 22–32)
Calcium: 7.7 mg/dL — ABNORMAL LOW (ref 8.9–10.3)
Chloride: 107 mmol/L (ref 98–111)
Creatinine, Ser: 1.38 mg/dL — ABNORMAL HIGH (ref 0.61–1.24)
GFR, Estimated: 52 mL/min — ABNORMAL LOW (ref >60)
Glucose, Bld: 87 mg/dL (ref 70–99)
Potassium: 3.4 mmol/L — ABNORMAL LOW (ref 3.5–5.1)
Sodium: 136 mmol/L (ref 135–145)
Total Bilirubin: 0.8 mg/dL (ref 0.3–1.2)
Total Protein: 5.3 g/dL — ABNORMAL LOW (ref 6.5–8.1)

## 2021-07-23 LAB — CBC
HCT: 27.5 % — ABNORMAL LOW (ref 39.0–52.0)
Hemoglobin: 9.1 g/dL — ABNORMAL LOW (ref 13.0–17.0)
MCH: 29.1 pg (ref 26.0–34.0)
MCHC: 33.1 g/dL (ref 30.0–36.0)
MCV: 87.9 fL (ref 80.0–100.0)
Platelets: 57 10*3/uL — ABNORMAL LOW (ref 150–400)
RBC: 3.13 MIL/uL — ABNORMAL LOW (ref 4.22–5.81)
RDW: 14.9 % (ref 11.5–15.5)
WBC: 6 10*3/uL (ref 4.0–10.5)
nRBC: 0 % (ref 0.0–0.2)

## 2021-07-23 LAB — PHOSPHORUS: Phosphorus: 2.2 mg/dL — ABNORMAL LOW (ref 2.5–4.6)

## 2021-07-23 LAB — MAGNESIUM: Magnesium: 2 mg/dL (ref 1.7–2.4)

## 2021-07-23 MED ORDER — CARVEDILOL 12.5 MG PO TABS
12.5000 mg | ORAL_TABLET | Freq: Two times a day (BID) | ORAL | Status: DC
  Administered 2021-07-23 – 2021-07-24 (×2): 12.5 mg via ORAL
  Filled 2021-07-23 (×2): qty 1

## 2021-07-23 MED ORDER — PANTOPRAZOLE SODIUM 40 MG PO TBEC: 40.0000 mg | DELAYED_RELEASE_TABLET | Freq: Every day | ORAL | Status: DC

## 2021-07-23 MED ORDER — DICYCLOMINE HCL 10 MG PO CAPS
10.0000 mg | ORAL_CAPSULE | Freq: Three times a day (TID) | ORAL | Status: DC
  Administered 2021-07-23 – 2021-07-24 (×3): 10 mg via ORAL
  Filled 2021-07-23 (×4): qty 1

## 2021-07-23 MED ORDER — K PHOS MONO-SOD PHOS DI & MONO 155-852-130 MG PO TABS
250.0000 mg | ORAL_TABLET | Freq: Three times a day (TID) | ORAL | Status: AC
  Administered 2021-07-23 (×3): 250 mg via ORAL
  Filled 2021-07-23 (×3): qty 1

## 2021-07-23 MED ORDER — POTASSIUM CHLORIDE CRYS ER 20 MEQ PO TBCR
40.0000 meq | EXTENDED_RELEASE_TABLET | Freq: Once | ORAL | Status: AC
  Administered 2021-07-23: 40 meq via ORAL
  Filled 2021-07-23: qty 2

## 2021-07-23 MED ORDER — OMEPRAZOLE 20 MG PO CPDR
40.0000 mg | DELAYED_RELEASE_CAPSULE | Freq: Every day | ORAL | Status: DC
  Administered 2021-07-23 – 2021-07-24 (×2): 40 mg via ORAL
  Filled 2021-07-23 (×2): qty 2

## 2021-07-23 MED ORDER — GABAPENTIN 300 MG PO CAPS
600.0000 mg | ORAL_CAPSULE | Freq: Two times a day (BID) | ORAL | Status: DC
  Administered 2021-07-23 – 2021-07-24 (×2): 600 mg via ORAL
  Filled 2021-07-23 (×2): qty 2

## 2021-07-23 MED ORDER — ALBUTEROL SULFATE (2.5 MG/3ML) 0.083% IN NEBU: 2.5000 mg | INHALATION_SOLUTION | Freq: Four times a day (QID) | RESPIRATORY_TRACT | Status: DC | PRN

## 2021-07-23 NOTE — Plan of Care (Signed)

## 2021-07-23 NOTE — Progress Notes (Signed)
?Progress Note ? ? ?Patient: Duane Hill. ZDG:[id number] DOB: [birth year] DOA: 07/19/2021     4 ?DOS: the patient was seen and examined on 07/23/2021 ?  ?Brief hospital course: ?Taken from H&P and ICU notes:  ?Patient presented to ED 07/19/2021 chief complaint of generalized weakness, severe penile bleeding, sudden onset slurred speech difficulty swallowing this morning with last known well the night prior when he went to bed.  Penile bleeding onset was to 4 days ago began worsening and copious bleeding, but no pain, starting 3 days ago.  He has a history of prostate cancer which was treated with radiation and seed placement into the prostate. ?  ?ED course: ... hemoglobin 7.6, thrombocytopenic with platelets 146, AKI/ARF with creatinine 6.68, hyperkalemic with potassium 6.9, troponin 77, AST 129 and ALT 47, EKG showed no significant abnormality, CT head showed no acute findings, CT abdomen/pelvis/chest showed hydroureteronephrosis.  ED was in consultation with neurology, urology, nephrology.  Patient was started on continuous bladder irrigation, treated for hyperkalemia and repeat potassium was improved to 5.3.,  Ordered 1 unit PRBC...  He was initially hypoxic but improved, taken off O2 via [place]. ?  ?Procedures and Significant Results:  ?Continuous bladder irrigation initiated 07/19/21  ?Transfusion 1 unit PRBC's ?CXR 07/19/2021 ?CT head without contrast 07/19/2021: Increased ventricular size with possible mild hydrocephalus, atrophy, atherosclerotic disease noted in carotid arteries ?CT chest/abdomen/pelvis 07/19/2021: Enlarged lymph nodes in chest, hydroureteronephrosis, questionable right lower lung infection ? ?Assessment and Plan: ?* Symptomatic anemia secondary to penile bleed ?POA, due to hematuria.    ?Hgb on presentation 07/19/21 was 7.6 ?PRBC transfusion initiated by ED ?--Monitor CBC ?--Transfuse for Hbg>7 or hemodynamic instability with active bleeding ? ?Hypophosphatemia ?Phos 2.2 this AM.  ?Replacing with  K-phos. ?Monitor and replaced PRN. ? ?Hypokalemia ?K 3.4 this AM. ?Replacing with K-Cl and K-phos. ?Monitor K and replace PRN. ? ?Generalized weakness ?PT recommending SNF for rehab.   ?TOC following. ?Fall precautions. ? ?Pancytopenia ([place]) ?Possibly developing in setting sepsis and ABLA.  ?- trend CBC ?- transfuse for Hb 7 ?- PLT transfusion for 50 if active blood loss  ? ?Thrombocytopenia ([place]) ?Stable. Unclear etiology. ?--Hold heparin products ?--SCD's for VTE ppx ?--Monitor CBC ? ?Plt count 47k>>57k today ? ?Transaminitis ?POA, improving.  Suspected due to shock liver and rhabdomyolysis.  Ammonia normal. ?--Trend LFT's.   ?--Continue fluids for rhabdo ? ?RUQ U/S on 4/3: ?1. Cholelithiasis without definite evidence of acute cholecystitis. Clinical correlation is suggested. If there is high clinical concern HIDA scan could be considered for further evaluation. 2. Hepatic steatosis ? ?Sepsis ([place]) ?RULED OUT.   ?Presented with tachypnea, leukopenia. ?Unclear source, possible UTI but urine culture no growth.  Treated with empiric Rocephin. ?Stop Rocephin. ? ?RUQ U/S with cholelithiasis, no acute cholecystitis. Hepatic steatosis. ? ?Rhabdomyolysis ?CK  3778.  Contributes to AKI. ?--Continue IV fluids ?--Trend CK ? ?Pressure injury of skin ?POA.  I agree with the wound description as outlined below. ?--frequent repositioning ?--diligent local wound care ?--monitor closely for signs of infection ? ?Pressure Injury 07/20/21 Sacrum Mid Stage 2 -  Partial thickness loss of dermis presenting as a shallow open injury with a red, pink wound bed without slough. (Active)  ?07/20/21 1124  ?Location: Sacrum  ?Location Orientation: Mid  ?Staging: Stage 2 -  Partial thickness loss of dermis presenting as a shallow open injury with a red, pink wound bed without slough.  ?Wound Description (Comments):   ?Present on Admission:   ? ? ? ? ?  NSTEMI (non-ST elevated myocardial infarction) ([place]) ?Type 2 non-stemi due to demand ischemia,  not ACS.  No chest pain or acute ECG changes. ?Cardiology consulted. ?No inpatient cardiac diagnostics recommended. ?Stat ECG and troponin if pt has chest pain. ? ?Acute respiratory failure with hypoxia ([place]) ?POA, improving.  Required HFNC at flow rate as high as 35 l/min.  Today on 6 L/min. ?--Wean O2 as tolerated ?--Keep sats >90% ? ? ?Hypoxia, improved ?See above ? ?Hyperkalemia ?POA, resolved.  Due to AKI. ?Monitor BMP. ? ?Acute renal failure ([place]) ?Multifactorial due to obstructive uropathy, rhabdomyolysis, hypotension. ?Renal function improving - Cr 1.39 from 2.04 ?--Nephrology consulted ?--Foley placed, now off CBI, urology following ?--Avoid nephrotoxic agents ?--Follow urine output ?--Monitor BMP ?--Maintain MAP>65 ?--Renal dose medications ? ?Stroke determined by clinical assessment [facility]) ?Presented with a right-sided hemiparesis and dysarthria.  Possibly symptomatic hypoperfusion secondary to hypotension, anemia and sepsis.  Deficits improving. ?No stroke seen on MRI.  Severe left PCA stenosis may be culprit lesion (but did not have visual symptoms initially).  ?--ASA when anemia is better ?--Neurology consulted, signed off ?--Maintain BP, MAP>65 ? ?Dysarthria ?POA, speech has markedly improved.   ?Due to stroke. ?Neurology following. ?SLP cleared for dysphagia 3 diet ? ?Bleeding of penis likely d/t hx radiation cystitis (hx prostate CA), possible prostate CA recurrence  ?Urology following - recommendations: ?"-Manually irrigate Foley catheter as needed for clots ?-Continue Foley catheter with plans for outpatient voiding trial in 2 weeks ?-Outpatient cystoscopy" ? ?Advanced care planning/counseling discussion ?Palliative care consulted. ?Code status is DNR. ?Otherwise full scope of care at this time. ? ?History of prostate cancer ?s/p IMRT with brachytherapy in 2013  ?With radiation cystitis and hematuria currently. ?Outpatient follow up. ? ?Type 2 diabetes mellitus with hyperglycemia ([place]) ?Holding  home medications/po meds  ?Sliding scale Novolog  ? ?Chronic radiation cystitis ?Presented with hematuria.   ?Foley in place, now off CBI ?Mgmt as outlined. ? ?GERD (gastroesophageal reflux disease) ?Intolerance to protonix. ?Resume home omeprazole ? ?Hypertension associated with diabetes ([place]) ?With hypotension requiring Levophed in ICU. ?Antihypertensives held.  Home regimen is Coreg 25 mg BID and lisinopril 40 mg. ?4/6: BP's now elevated ?--Resume Coreg at 12.5 mg BID for now ?--Hold lisinopril, resume when indicated ? ? ? ? ?  ? ?Subjective: Pt awake, resting in bed.  Reports overall feeling well.  Speech continues to improve.  Happy to be out of ICU.  Worried about his dog and cats.   ? ?Physical Exam: ?Vitals:  ? 07/22/21 2330 07/23/21 0747 07/23/21 1135 07/23/21 1550  ?BP: (!) 146/63 (!) 150/46 (!) 155/60 (!) 167/72  ?Pulse: 66 61 62 65  ?Resp: '14 18 18 18  '$ ?Temp: 98.5 ?F (36.9 ?C) 98.2 ?F (36.8 ?C) 97.9 ?F (36.6 ?C) 98.2 ?F (36.8 ?C)  ?TempSrc:      ?SpO2: 96% 96% 97% 98%  ?Weight:      ?Height:      ? ?General exam: awake, alert, no acute distress ?HEENT: atraumatic, clear conjunctiva, anicteric sclera, moist mucus membranes, hearing grossly normal  ?Respiratory system: normal respiratory effort, on room air. ?Cardiovascular system: RRR, no pedal edema.   ?Central nervous system: A&O x3. no gross focal neurologic deficits, normal speech ?Extremities: moves all, no edema, normal tone ?Skin: dry, intact, normal temperature ?Psychiatry: normal mood, congruent affect, judgement and insight appear normal ? ? ?Data Reviewed: ? ?Labs reviewed and notable for  K 3.4, BUN 46, Cr 1.38, Ca 7.7, Phos 2.2, Albumin 2.3, AST 164, ALT  341, Tprotein 5.3, GFR 52, Hbg 9.1, Plt 57 ? ?Family Communication: none at bedside, will attempt to call ? ?Disposition: ?Status is: Inpatient ?Remains inpatient appropriate because: severity of illness remaining on IV fluids for AKI and rhabdomyolysis.  Requires SNF placement for rehab. ? ?  Planned Discharge Destination: Skilled nursing facility ? ? ? ?Time spent: 35 minutes ? ?Author: ?Ezekiel Slocumb, DO ?07/23/2021 5:08 PM ? ?For on call review [email or web address].  ?

## 2021-07-23 NOTE — Evaluation (Signed)
Occupational Therapy Evaluation ?Patient Details ?Name: Duane Hill. ?MRN: [id number] ?DOB: 1940-10-06 ?Today's Date: 07/23/2021 ? ? ?History of Present Illness Patient is a 81 yo M with PMH that includes HTN, arthritis, GERD, HLD, and radiation proctitis who presented to ED 07/19/2021 chief complaint of generalized weakness, severe penile bleeding, sudden onset slurred speech and difficulty swallowing.  MD assessment includes: Symptomatic anemia secondary to penile bleed, transaminitis, sepsis, and rhabdomyolysis.  ? ?Clinical Impression ?  ?Patient presenting with decreased Ind in self care, balance, functional mobility/transfers, endurance, and safety awareness.Patient reports living at home alone and being independent without use of AD. He reports having a black lab named cooper that he want's to get home to. Pt appears to be motivated but very weak overall.  Patient currently functioning at max A for bed mobility for B LEs and trunk support. Pt needing min - mod lifting assistance to stand from standard height. Once standing, pt begins to have BM and is assisted onto [facility] with min A and use of RW. Pt needing max A for hygiene and clothing management and then standing to transfer to recliner chair. Pt's nurse remains in room to give medications. He fatigued very quickly. Patient will benefit from acute OT to increase overall independence in the areas of ADLs, functional mobility, and safety awareness in order to safely discharge to next venue of care.   ?   ? ?Recommendations for follow up therapy are one component of a multi-disciplinary discharge planning process, led by the attending physician.  Recommendations may be updated based on patient status, additional functional criteria and insurance authorization.  ? ?Follow Up Recommendations ? Skilled nursing-short term rehab (<3 hours/day)  ?  ?Assistance Recommended at Discharge Frequent or constant Supervision/Assistance  ?Patient can return home with the  following A little help with walking and/or transfers;A lot of help with bathing/dressing/bathroom;Assistance with cooking/housework;Help with stairs or ramp for entrance ? ?  ?Functional Status Assessment ? Patient has had a recent decline in their functional status and demonstrates the ability to make significant improvements in function in a reasonable and predictable amount of time.  ?Equipment Recommendations ? Other (comment) (defer to next venue of care)  ?  ?   ?Precautions / Restrictions Precautions ?Precautions: Fall ?Restrictions ?Weight Bearing Restrictions: No  ? ?  ? ?Mobility Bed Mobility ?Overal bed mobility: Needs Assistance ?Bed Mobility: Supine to Sit ?  ?  ?Supine to sit: Max assist ?  ?  ?  ?  ? ?Transfers ?Overall transfer level: Needs assistance ?Equipment used: Rolling walker (2 wheels) ?Transfers: Sit to/from Stand, Bed to chair/wheelchair/BSC ?Sit to Stand: Min assist, Mod assist ?  ?  ?Step pivot transfers: Min assist ?  ?  ?  ?  ? ?  ?Balance Overall balance assessment: Needs assistance, History of Falls ?Sitting-balance support: Bilateral upper extremity supported, Feet unsupported ?Sitting balance-Leahy Scale: Fair ?  ?  ?Standing balance support: Bilateral upper extremity supported, During functional activity, Reliant on assistive device for balance ?Standing balance-Leahy Scale: Poor ?  ?  ?  ?  ?  ?  ?  ?  ?  ?  ?  ?  ?   ? ?ADL either performed or assessed with clinical judgement  ? ?ADL Overall ADL's : Needs assistance/impaired ?  ?  ?Grooming: Wash/dry hands;Wash/dry face;Sitting;Set up;Supervision/safety ?  ?  ?  ?  ?  ?  ?  ?  ?  ?Toilet Transfer: Minimal assistance;BSC/3in1;Ambulation;Rolling walker (2 wheels) ?  ?  Toileting- Clothing Manipulation and Hygiene: Moderate assistance;Sit to/from stand ?  ?  ?  ?Functional mobility during ADLs: Minimal assistance;Rolling walker (2 wheels) ?   ? ? ? ?Vision Patient Visual Report: No change from baseline ?   ?   ?   ?   ? ?Pertinent  Vitals/Pain Pain Assessment ?Pain Assessment: No/denies pain  ? ? ? ?Hand Dominance Right ?  ?Extremity/Trunk Assessment Upper Extremity Assessment ?Upper Extremity Assessment: Generalized weakness ?  ?Lower Extremity Assessment ?Lower Extremity Assessment: Generalized weakness ?  ?  ?  ?Communication Communication ?Communication: No difficulties ?  ?Cognition Arousal/Alertness: Awake/alert ?Behavior During Therapy: [facility] for tasks assessed/performed ?Overall Cognitive Status: Within Functional Limits for tasks assessed ?  ?  ?  ?  ?  ?  ?  ?  ?  ?  ?  ?  ?  ?  ?  ?  ?  ?  ?  ?   ?   ?   ? ? ?Home Living Family/patient expects to be discharged to:: Private residence ?Living Arrangements: Alone ?Available Help at Discharge: Other (Comment) ?Type of Home: House ?Home Access: Stairs to enter ?Entrance Stairs-Number of Steps: 3 ?Entrance Stairs-Rails: Right;Left;Can reach both ?Home Layout: Two level;Able to live on main level with bedroom/bathroom ?  ?  ?  ?  ?Bathroom Toilet: Standard ?  ?  ?Home Equipment: Kasandra Knudsen - single point ?  ?  ? Lives With: Alone ? ?  ?Prior Functioning/Environment Prior Level of Function : Independent/Modified Independent ?  ?  ?  ?  ?  ?  ?Mobility Comments: Mod Ind amb mostly HH distances, 3-4 falls in the last 6 months secondary to legs buckling ?ADLs Comments: Ind with ADLs, sponge bathes, sister brings groceries and drives pt to apts ?  ? ?  ?  ?OT Problem List: Decreased strength;Decreased activity tolerance;Impaired balance (sitting and/or standing);Decreased safety awareness;Decreased knowledge of use of DME or AE ?  ?   ?OT Treatment/Interventions: Self-care/ADL training;Therapeutic exercise;Therapeutic activities;Energy conservation;DME and/or AE instruction;Patient/family education;Balance training;Manual therapy  ?  ?OT Goals(Current goals can be found in the care plan section) Acute Rehab OT Goals ?Patient Stated Goal: to go home to my dog (cooper) ?OT Goal Formulation: With  patient ?Time For Goal Achievement: 08/06/21 ?Potential to Achieve Goals: Good ?ADL Goals ?Pt Will Perform Grooming: with supervision;standing ?Pt Will Perform Lower Body Dressing: with supervision;sit to/from stand ?Pt Will Transfer to Toilet: with supervision;ambulating ?Pt Will Perform Toileting - Clothing Manipulation and hygiene: with supervision;sit to/from stand  ?OT Frequency: Min 2X/week ?  ? ?   ?AM-PAC OT "6 Clicks" Daily Activity     ?Outcome Measure Help from another person eating meals?: None ?Help from another person taking care of personal grooming?: A Little ?Help from another person toileting, which includes using toliet, bedpan, or urinal?: A Lot ?Help from another person bathing (including washing, rinsing, drying)?: A Lot ?Help from another person to put on and taking off regular upper body clothing?: A Little ?Help from another person to put on and taking off regular lower body clothing?: A Lot ?6 Click Score: 16 ?  ?End of Session Equipment Utilized During Treatment: Rolling walker (2 wheels) ?Nurse Communication: Mobility status ? ?Activity Tolerance: Patient tolerated treatment well ?Patient left: in bed;with call bell/phone within reach;with chair alarm set ? ?OT Visit Diagnosis: Unsteadiness on feet (R26.81);Repeated falls (R29.6);Muscle weakness (generalized) (M62.81)  ?              ?Time: 8921-1941 ?  OT Time Calculation (min): 19 min ?Charges:  OT General Charges ?$OT Visit: 1 Visit ?OT Evaluation ?$OT Eval Moderate Complexity: 1 Mod ?OT Treatments ?$Self Care/Home Management : 8-22 mins ? ?Darleen Crocker, [place], OTR/L , CBIS ?ascom [phone]  ?07/23/21, 11:33 AM  ?

## 2021-07-23 NOTE — Assessment & Plan Note (Addendum)
K 3.4 again this AM despite replacement. ?Replaced with K-Cl and K-phos. ?--Discharge on K 20 mEq daily ?--Repeat labs in 1 to 2 weeks ?

## 2021-07-23 NOTE — Progress Notes (Addendum)
?[facility]  ?ROUNDING NOTE  ? ?Subjective:  ? ?Patient currently seated in chair, family at bedside ?Patient states he was able to ambulate with assistance around the room to be seated in chair.  Tolerating meals without nausea and vomiting ?Remains on room air ? ?Potassium 3.4 ?UOP .75L recorded in previous 24 hours ? ?Objective:  ?Vital signs in last 24 hours:  ?Temp:  [97.4 ?F (36.3 ?C)-98.5 ?F (36.9 ?C)] 98.2 ?F (36.8 ?C) (04/06 0747) ?Pulse Rate:  [61-70] 61 (04/06 0747) ?Resp:  [14-28] 18 (04/06 0747) ?BP: (139-151)/(46-65) 150/46 (04/06 0747) ?SpO2:  [92 %-96 %] 96 % (04/06 0747) ? ?Weight change:  ?Filed Weights  ? 07/19/21 2200  ?Weight: 67.8 kg  ? ? ?Intake/Output: ?I/O last 3 completed shifts: ?In: 1220 [P.O.:720; IV Piggyback:500] ?Out: 3650 [Urine:3650] ?  ?Intake/Output this shift: ? No intake/output data recorded. ? ?Physical Exam: ?General: NAD  ?Head: Normocephalic, atraumatic. Moist oral mucosal membranes  ?Eyes: Anicteric  ?Neck: Supple  ?Lungs:  Clear bilaterally, normal effort, room air  ?Heart: S1S2 no rubs  ?Abdomen:  Soft, nontender, bowel sounds present  ?Extremities: No peripheral edema.  ?Neurologic: Awake, alert, following commands  ?Skin: No acute rash  ?Access: No hemodialysis access  ? ? ?Basic Metabolic Panel: ?Recent Labs  ?Lab 07/20/21 ?0424 07/20/21 ?1751 07/20/21 ?1847 07/20/21 ?2245 07/21/21 ?0258 07/21/21 ?5277 07/21/21 ?8242 07/21/21 ?1913 07/22/21 ?0429 07/22/21 ?1757 07/23/21 ?0444  ?NA 142   < > 141  --  140 142  --   --  137  --  136  ?K 3.2*   < > 3.1*   < > 3.2* 3.1*  3.1* 3.1* 3.4* 3.4* 3.6 3.4*  ?CL 91*   < > 94*  --  96* 97*  --   --  102  --  107  ?CO2 34*   < > 28  --  29 27  --   --  25  --  22  ?GLUCOSE 260*   < > 145*  --  145* 132*  --   --  115*  --  87  ?BUN 81*   < > 77*  --  77* 73*  --   --  59*  --  46*  ?CREATININE 5.11*   < > 4.07*  --  3.70* 3.34*  --   --  2.04*  --  1.38*  ?CALCIUM 6.6*   < > 6.4*  --  6.5* 6.8*  --   --  7.4*  --  7.7*   ?MG 2.3  --   --   --   --   --  2.3  --   --   --  2.0  ?PHOS 6.4*  --   --   --   --   --   --   --  2.8  --  2.2*  ? < > = values in this interval not displayed.  ? ? ? ?Liver Function Tests: ?Recent Labs  ?Lab 07/19/21 ?1053 07/20/21 ?0424 07/21/21 ?0420 07/22/21 ?3536 07/23/21 ?1443  ?AST 129* 1,457*  1,438* 630* 323* 164*  ?ALT 47* 713*  11 728* 548* 341*  ?ALKPHOS 41 37*  37* 43 51 67  ?BILITOT 1.0 0.8  0.9 0.8 0.7 0.8  ?PROT 5.8* 5.2*  5.2* 5.3* 5.4* 5.3*  ?ALBUMIN 3.3* 2.7*  2.5* 2.3* 2.2* 2.3*  ? ? ?Recent Labs  ?Lab 07/20/21 ?0424  ?LIPASE 16  ? ? ?Recent Labs  ?Lab 07/20/21 ?1540  ?  AMMONIA 28  ? ? ? ?CBC: ?Recent Labs  ?Lab 07/19/21 ?1053 07/19/21 ?1901 07/20/21 ?0424 07/20/21 ?1101 07/20/21 ?1713 07/21/21 ?0017 07/22/21 ?4944 07/23/21 ?0444  ?WBC 8.9  --  2.7*  --   --  3.2* 4.1 6.0  ?NEUTROABS 5.5  --   --   --   --   --   --   --   ?HGB 7.6*   < > 9.8* 9.5* 9.6* 9.0* 8.7* 9.1*  ?HCT 24.3*   < > 28.5* 27.4* 28.0* 27.3* 26.7* 27.5*  ?MCV 93.1  --  85.3  --   --  87.8 87.8 87.9  ?PLT 146*  --  86*  --   --  52* 47* 57*  ? < > = values in this interval not displayed.  ? ? ? ?Cardiac Enzymes: ?Recent Labs  ?Lab 07/20/21 ?0424 07/21/21 ?0420 07/22/21 ?0429  ?CKTOTAL 9,675* 3,778* 1,864*  ? ? ? ?BNP: ?Invalid input(s): POCBNP ? ?CBG: ?Recent Labs  ?Lab 07/22/21 ?1625 07/22/21 ?2019 07/23/21 ?0008 07/23/21 ?9163 07/23/21 ?8466  ?GLUCAP 132* 158* 99 87 92  ? ? ? ?Microbiology: ?Results for orders placed or performed during the hospital encounter of 07/19/21  ?Resp Panel by RT-PCR (Flu A&B, Covid) Nasopharyngeal Swab     Status: None  ? Collection Time: 07/19/21 10:54 AM  ? Specimen: Nasopharyngeal Swab; Nasopharyngeal(NP) swabs in vial transport medium  ?Result Value Ref Range Status  ? SARS Coronavirus 2 by RT PCR NEGATIVE NEGATIVE Final  ?  Comment: (NOTE) ?SARS-CoV-2 target nucleic acids are NOT DETECTED. ? ?The SARS-CoV-2 RNA is generally detectable in upper respiratory ?specimens during the acute  phase of infection. The lowest ?concentration of SARS-CoV-2 viral copies this assay can detect is ?138 copies/mL. A negative result does not preclude SARS-Cov-2 ?infection and should not be used as the sole basis for treatment or ?other patient management decisions. A negative result may occur with  ?improper specimen collection/handling, submission of specimen other ?than nasopharyngeal swab, presence of viral mutation(s) within the ?areas targeted by this assay, and inadequate number of viral ?copies(<138 copies/mL). A negative result must be combined with ?clinical observations, patient history, and epidemiological ?information. The expected result is Negative. ? ?Fact Sheet for Patients:  ?EntrepreneurPulse.com.au ? ?Fact Sheet for Healthcare Providers:  ?IncredibleEmployment.be ? ?This test is no t yet approved or cleared by the [place] FDA and  ?has been authorized for detection and/or diagnosis of SARS-CoV-2 by ?FDA under an Emergency Use Authorization (EUA). This EUA will remain  ?in effect (meaning this test can be used) for the duration of the ?COVID-19 declaration under Section 564(b)(1) of the Act, 21 ?U.S.C.section 360bbb-3(b)(1), unless the authorization is terminated  ?or revoked sooner.  ? ? ?  ? Influenza A by PCR NEGATIVE NEGATIVE Final  ? Influenza B by PCR NEGATIVE NEGATIVE Final  ?  Comment: (NOTE) ?The Xpert Xpress SARS-CoV-2/FLU/RSV plus assay is intended as an aid ?in the diagnosis of influenza from Nasopharyngeal swab specimens and ?should not be used as a sole basis for treatment. Nasal washings and ?aspirates are unacceptable for Xpert Xpress SARS-CoV-2/FLU/RSV ?testing. ? ?Fact Sheet for Patients: ?EntrepreneurPulse.com.au ? ?Fact Sheet for Healthcare Providers: ?IncredibleEmployment.be ? ?This test is not yet approved or cleared by the [place] FDA and ?has been authorized for detection and/or diagnosis of  SARS-CoV-2 by ?FDA under an Emergency Use Authorization (EUA). This EUA will remain ?in effect (meaning this test can be used) for the duration of the ?COVID-19 declaration under  Section 564(b)(1) of the Act, 21 U.S.C. ?section 360bbb-3(b)(1), unless the authorization is terminated or ?revoked. ? ?Performed at [facility], [place], ?[place] [address] ?  ?MRSA Next Gen by PCR, Nasal     Status: None  ? Collection Time: 07/19/21  6:26 PM  ? Specimen: Nasal Mucosa; Nasal Swab  ?Result Value Ref Range Status  ? MRSA by PCR Next Gen NOT DETECTED NOT DETECTED Final  ?  Comment: (NOTE) ?The GeneXpert MRSA Assay (FDA approved for NASAL specimens only), ?is one component of a comprehensive MRSA colonization surveillance ?program. It is not intended to diagnose MRSA infection nor to guide ?or monitor treatment for MRSA infections. ?Test performance is not FDA approved in patients less than 2 years ?old. ?Performed at [facility], [place], ?[place] [address] ?  ?CULTURE, BLOOD (ROUTINE X 2) w Reflex to ID Panel     Status: None (Preliminary result)  ? Collection Time: 07/20/21  8:31 AM  ? Specimen: BLOOD  ?Result Value Ref Range Status  ? Specimen Description BLOOD BLOOD RIGHT HAND  Final  ? Special Requests   Final  ?  BOTTLES DRAWN AEROBIC AND ANAEROBIC Blood Culture adequate volume  ? Culture   Final  ?  NO GROWTH 3 DAYS ?Performed at [facility], [address], [place], [place] [address] ?  ? Report Status PENDING  Incomplete  ?CULTURE, BLOOD (ROUTINE X 2) w Reflex to ID Panel     Status: None (Preliminary result)  ? Collection Time: 07/20/21  8:31 AM  ? Specimen: BLOOD  ?Result Value Ref Range Status  ? Specimen Description BLOOD BLOOD LEFT HAND  Final  ? Special Requests   Final  ?  BOTTLES DRAWN AEROBIC AND ANAEROBIC Blood Culture adequate volume  ? Culture   Final  ?  NO GROWTH 3 DAYS ?Performed at [facility], [address]  [place], [place] [address] ?  ? Report Status PENDING  Incomplete  ?Urine Culture     Status: None  ? Collection Time: 07/21/21 12:22 PM  ? Specimen: Urine, Catheterized  ?Result Value Ref Range Status  ? Specimen Descriptio

## 2021-07-23 NOTE — TOC Progression Note (Signed)
Transition of Care (TOC) - Progression Note  ? ? ?Patient Details  ?Name: Duane Hill. ?MRN: [id number] ?Date of Birth: 1941-03-31 ? ?Transition of Care (TOC) CM/SW Contact  ?Conception Oms, RN ?Phone Number: ?07/23/2021, 11:17 AM ? ?Clinical Narrative:   Received a message from Georgianne Fick the patient's son in law, He stated that he has HCPOA, I explained that the patient is alert and oriented and therefore I have to get permission to speak to him about his health, He stated that the patient will manipulate everyone, I explained that the law stated that the person has the right to make their own choices unless they are not alert and oriented and that would be when he would be able to make health decisions for the patient as the HCPOA ? ? ? ?Expected Discharge Plan: [place] ?Barriers to Discharge: Continued Medical Work up ? ?Expected Discharge Plan and Services ?Expected Discharge Plan: [place] ?  ?Discharge Planning Services: CM Consult ?Post Acute Care Choice: [place] ?Living arrangements for the past 2 months: [place] ?                ?DME Arranged: N/A ?DME Agency: NA ?  ?  ?  ?HH Arranged: NA ?  ?  ?  ?  ? ? ?Social Determinants of Health (SDOH) Interventions ?  ? ?Readmission Risk Interventions ?   ? View : No data to display.  ?  ?  ?  ? ? ?

## 2021-07-23 NOTE — NC FL2 (Signed)
?[place] MEDICAID FL2 LEVEL OF CARE SCREENING TOOL  ?  ? ?IDENTIFICATION  ?Patient Name: ?Duane Hill. Birthdate: 08-26-1940 Sex: male Admission Date (Current Location): ?07/19/2021  ?[place] and [place] Number: ? [place] ?  Facility and Address:  ?[facility], [address] [place], [place] [address] ?     Provider Number: ?[id number]  ?Attending Physician Name and Address:  ?Ezekiel Slocumb, DO ? Relative Name and Phone Number:  ?Dola Argyle [phone] ?   ?Current Level of Care: ?Hospital Recommended Level of Care: ?[place] Prior Approval Number: ?  ? ?Date Approved/Denied: ?  PASRR Number: ?[id number] A ? ?Discharge Plan: ?SNF ?  ? ?Current Diagnoses: ?Patient Active Problem List  ? Diagnosis Date Noted  ? Generalized weakness 07/22/2021  ? Rhabdomyolysis 07/21/2021  ? Sepsis ([place]) 07/21/2021  ? Transaminitis 07/21/2021  ? Thrombocytopenia ([place]) 07/21/2021  ? Pancytopenia ([place]) 07/21/2021  ? Pressure injury of skin 07/20/2021  ? Symptomatic anemia secondary to penile bleed 07/19/2021  ? Bleeding of penis likely d/t hx radiation cystitis (hx prostate CA), possible prostate CA recurrence  07/19/2021  ? Dysarthria 07/19/2021  ? Stroke determined by clinical assessment ([place]) 07/19/2021  ? Acute renal failure ([place]) 07/19/2021  ? Hyperkalemia 07/19/2021  ? Hypoxia, improved 07/19/2021  ? NSTEMI (non-ST elevated myocardial infarction) ([place]) 07/19/2021  ? Acute respiratory failure with hypoxia ([place])   ? B12 deficiency 04/29/2020  ? HSV (herpes simplex virus) infection 05/10/2019  ? Allergic rhinitis 02/14/2019  ? Advanced care planning/counseling discussion 11/29/2016  ? GERD (gastroesophageal reflux disease) 11/05/2014  ? Hyperlipidemia associated with type 2 diabetes mellitus ([place]) 11/05/2014  ? Chronic radiation cystitis 11/05/2014  ? Hypertension associated with diabetes ([place]) 02/02/2013  ? Type 2 diabetes mellitus with hyperglycemia ([place]) 02/02/2013  ?  History of prostate cancer 02/02/2013  ? ? ?Orientation RESPIRATION BLADDER Height & Weight   ?  ?Self, Time, Situation, Place ? Normal Continent Weight: 67.8 kg ?Height:  '5\' 8"'$  (172.7 cm)  ?BEHAVIORAL SYMPTOMS/MOOD NEUROLOGICAL BOWEL NUTRITION STATUS  ?    Continent Diet (See DC summary)  ?AMBULATORY STATUS COMMUNICATION OF NEEDS Skin   ?Extensive Assist Verbally Normal ?  ?  ?  ?    ?     ?     ? ? ?Personal Care Assistance Level of Assistance  ?Bathing, Feeding, Dressing Bathing Assistance: Maximum assistance ?Feeding assistance: Limited assistance ?Dressing Assistance: Maximum assistance ?   ? ?Functional Limitations Info  ?    ?  ?   ? ? ?SPECIAL CARE FACTORS FREQUENCY  ?PT (By licensed PT), OT (By licensed OT)   ?  ?PT Frequency: 5 times per week ?OT Frequency: 5 times per week ?  ?  ?  ?   ? ? ?Contractures Contractures Info: Not present  ? ? ?Additional Factors Info  ?Code Status, Allergies Code Status Info: full code ?Allergies Info: Protonix ?  ?  ?  ?   ? ?Current Medications (07/23/2021):  This is the current hospital active medication list ?Current Facility-Administered Medications  ?Medication Dose Route Frequency Provider Last Rate Last Admin  ? 0.9 %  sodium chloride infusion   Intravenous PRN Flora Lipps, MD   Stopped at 07/20/21 1111  ? 0.9 %  sodium chloride infusion  250 mL Intravenous Continuous Teressa Lower, NP      ? acetaminophen (TYLENOL) suppository 650 mg  650 mg Rectal Q6H PRN Teressa Lower, NP   650 mg at 07/20/21 1808  ?  chlorhexidine (PERIDEX) 0.12 % solution 15 mL  15 mL Mouth Rinse BID Emeterio Reeve, DO   15 mL at 07/23/21 0944  ? Chlorhexidine Gluconate Cloth 2 % PADS 6 each  6 each Topical [id number] Flora Lipps, MD   6 each at 07/23/21 0502  ? dicyclomine (BENTYL) capsule 10 mg  10 mg Oral TID AC Nicole Kindred A, DO      ? insulin aspart (novoLOG) injection 0-9 Units  0-9 Units Subcutaneous Q4H Emeterio Reeve, DO   1 Units at 07/23/21 1232  ? insulin detemir (LEVEMIR)  injection 7 Units  7 Units Subcutaneous BID Dallie Piles, [facility]   7 Units at 07/23/21 2992  ? ketotifen (ZADITOR) 0.025 % ophthalmic solution 1 drop  1 drop Both Eyes BID PRN Dallie Piles, RPH   1 drop at 07/22/21 1121  ? lactated ringers infusion   Intravenous Continuous Maryjane Hurter, MD   Stopped at 07/21/21 1441  ? MEDLINE mouth rinse  15 mL Mouth Rinse q12n4p Emeterio Reeve, DO   15 mL at 07/23/21 1233  ? midazolam (VERSED) injection 1 mg  1 mg Intravenous Q5 min PRN Maryjane Hurter, MD   0.5 mg at 07/21/21 1030  ? ondansetron (ZOFRAN) injection 4 mg  4 mg Intravenous Q6H PRN Val Riles, MD      ? phosphorus (K PHOS NEUTRAL) tablet 250 mg  250 mg Oral TID Nicole Kindred A, DO   250 mg at 07/23/21 1232  ? sodium chloride irrigation 0.9 % 3,000 mL  3,000 mL Irrigation Continuous Vanessa [place], MD   3,000 mL at 07/20/21 2353  ? ? ? ?Discharge Medications: ?Please see discharge summary for a list of discharge medications. ? ?Relevant Imaging Results: ? ?Relevant Lab Results: ? ? ?Additional Information ?SS# [id number] ? ?Conception Oms, RN ? ? ? ? ?

## 2021-07-23 NOTE — Assessment & Plan Note (Addendum)
Phos 2.2 yesterday, replaced with K-phos. ?Phos, Recheck in follow-up and further replace if needed. ?

## 2021-07-23 NOTE — Assessment & Plan Note (Addendum)
Stable. Unclear etiology. ?--Hold heparin products ?--Hold off aspirin as recommended by neurology for secondary stroke prophylaxis ?--Monitor CBC at follow-up ? ?Plt count 47k>>57k>> 77 today ?

## 2021-07-23 NOTE — Progress Notes (Signed)
Physical Therapy Treatment ?Patient Details ?Name: Duane Hill. ?MRN: [id number] ?DOB: [birth year] ?Today's Date: 07/23/2021 ? ? ?History of Present Illness Patient is a 81 yo M with PMH that includes HTN, arthritis, GERD, HLD, and radiation proctitis who presented to ED 07/19/2021 chief complaint of generalized weakness, severe penile bleeding, sudden onset slurred speech and difficulty swallowing.  MD assessment includes: Symptomatic anemia secondary to penile bleed, transaminitis, sepsis, and rhabdomyolysis. ? ?  ?PT Comments  ? ? Patient fatigued from having been to the chair earlier today and declined any out of bed activity. He was agreeable to in bed exercises which were performed with assistance for strengthening bilateral LE. He is fatigued with activity. Recommend to continue PT to maximize independence. He is agreeable to a short term rehab stay at SNF which is recommended before going home.  ?  ?Recommendations for follow up therapy are one component of a multi-disciplinary discharge planning process, led by the attending physician.  Recommendations may be updated based on patient status, additional functional criteria and insurance authorization. ? ?Follow Up Recommendations ? Skilled nursing-short term rehab (<3 hours/day) ?  ?  ?Assistance Recommended at Discharge Frequent or constant Supervision/Assistance  ?Patient can return home with the following Two people to help with walking and/or transfers;Two people to help with bathing/dressing/bathroom;Assistance with cooking/housework;Direct supervision/assist for medications management;Direct supervision/assist for financial management;Assist for transportation;Help with stairs or ramp for entrance ?  ?Equipment Recommendations ? Rolling walker (2 wheels);BSC/3in1  ?  ?Recommendations for Other Services   ? ? ?  ?Precautions / Restrictions Precautions ?Precautions: Fall ?Restrictions ?Weight Bearing Restrictions: No  ?  ? ?Mobility ? Bed Mobility ?  ?   ?  ?  ?  ?  ?  ?General bed mobility comments: patient declined bed mobility at this time. he reports fatigue from having been out of bed earlier and was agreeable to in bed exercise for strengthening ?  ? ?Transfers ?  ?  ?  ?  ?  ?  ?  ?  ?  ?  ?  ? ?Ambulation/Gait ?  ?  ?  ?  ?  ?  ?  ?  ? ? ?Stairs ?  ?  ?  ?  ?  ? ? ?Wheelchair Mobility ?  ? ?Modified Rankin (Stroke Patients Only) ?  ? ? ?  ?Balance   ?  ?  ?  ?  ?  ?  ?  ?  ?  ?  ?  ?  ?  ?  ?  ?  ?  ?  ?  ? ?  ?Cognition Arousal/Alertness: Awake/alert ?Behavior During Therapy: [facility] for tasks assessed/performed ?Overall Cognitive Status: Within Functional Limits for tasks assessed ?  ?  ?  ?  ?  ?  ?  ?  ?  ?  ?  ?  ?  ?  ?  ?  ?  ?  ?  ? ?  ?Exercises General Exercises - Lower Extremity ?Ankle Circles/Pumps: AROM, Strengthening, Both, 10 reps, Supine ?Heel Slides: AAROM, Strengthening, Both, 10 reps, Supine (x 2 sets) ?Hip ABduction/ADduction: AAROM, Strengthening, Both, 10 reps, Supine (x 2 sets each) ?Straight Leg Raises: AAROM, Strengthening, Both, 10 reps, Supine ?Other Exercises ?Other Exercises: verbal and tactile cues for technique for strengthening ? ?  ?General Comments   ?  ?  ? ?Pertinent Vitals/Pain Pain Assessment ?Pain Assessment: No/denies pain  ? ? ?Home Living   ?  ?  ?  ?  ?  ?  ?  ?  ?  ?   ?  ?  Prior Function    ?  ?  ?   ? ?PT Goals (current goals can now be found in the care plan section) Acute Rehab PT Goals ?Patient Stated Goal: to be strog enough to return home ?PT Goal Formulation: With patient ?Time For Goal Achievement: 08/04/21 ?Potential to Achieve Goals: Good ?Progress towards PT goals: Progressing toward goals ? ?  ?Frequency ? ? ? Min 2X/week ? ? ? ?  ?PT Plan Current plan remains appropriate  ? ? ?Co-evaluation   ?  ?  ?  ?  ? ?  ?AM-PAC PT "6 Clicks" Mobility   ?Outcome Measure ? Help needed turning from your back to your side while in a flat bed without using bedrails?: A Lot ?Help needed moving from lying on your back to  sitting on the side of a flat bed without using bedrails?: A Lot ?Help needed moving to and from a bed to a chair (including a wheelchair)?: A Lot ?Help needed standing up from a chair using your arms (e.g., wheelchair or bedside chair)?: A Lot ?Help needed to walk in hospital room?: Total ?Help needed climbing 3-5 steps with a railing? : Total ?6 Click Score: 10 ? ?  ?End of Session   ?Activity Tolerance: Patient tolerated treatment well ?Patient left: in bed;with call bell/phone within reach;with bed alarm set ?  ?PT Visit Diagnosis: Unsteadiness on feet (R26.81);History of falling (Z91.81);Muscle weakness (generalized) (M62.81);Difficulty in walking, not elsewhere classified (R26.2) ?  ? ? ?Time: 1142-1200 ?PT Time Calculation (min) (ACUTE ONLY): 18 min ? ?Charges:  $Therapeutic Exercise: 8-22 mins          ?          ? ?Duane Hill, PT, MPT ? ? ? ?Duane Hill ?07/23/2021, 2:01 PM ? ?

## 2021-07-23 NOTE — TOC Progression Note (Signed)
Transition of Care (TOC) - Progression Note  ? ? ?Patient Details  ?Name: Duane Hill. ?MRN: [id number] ?Date of Birth: 1941-03-07 ? ?Transition of Care (TOC) CM/SW Contact  ?Conception Oms, RN ?Phone Number: ?07/23/2021, 11:25 AM ? ?Clinical Narrative:   The patient is alert and oriented and is agreeable to a STR SNF bedsearch ? ? ? ?Expected Discharge Plan: [place] ?Barriers to Discharge: Continued Medical Work up ? ?Expected Discharge Plan and Services ?Expected Discharge Plan: [place] ?  ?Discharge Planning Services: CM Consult ?Post Acute Care Choice: [place] ?Living arrangements for the past 2 months: [place] ?                ?DME Arranged: N/A ?DME Agency: NA ?  ?  ?  ?HH Arranged: NA ?  ?  ?  ?  ? ? ?Social Determinants of Health (SDOH) Interventions ?  ? ?Readmission Risk Interventions ?   ? View : No data to display.  ?  ?  ?  ? ? ?

## 2021-07-24 ENCOUNTER — Encounter: Payer: Self-pay | Admitting: Osteopathic Medicine

## 2021-07-24 DIAGNOSIS — J309 Allergic rhinitis, unspecified: Secondary | ICD-10-CM | POA: Diagnosis not present

## 2021-07-24 DIAGNOSIS — Z66 Do not resuscitate: Secondary | ICD-10-CM | POA: Diagnosis not present

## 2021-07-24 DIAGNOSIS — M79669 Pain in unspecified lower leg: Secondary | ICD-10-CM | POA: Diagnosis not present

## 2021-07-24 DIAGNOSIS — J1282 Pneumonia due to coronavirus disease 2019: Secondary | ICD-10-CM | POA: Diagnosis not present

## 2021-07-24 DIAGNOSIS — D62 Acute posthemorrhagic anemia: Secondary | ICD-10-CM | POA: Diagnosis not present

## 2021-07-24 DIAGNOSIS — L89152 Pressure ulcer of sacral region, stage 2: Secondary | ICD-10-CM | POA: Diagnosis not present

## 2021-07-24 DIAGNOSIS — R531 Weakness: Secondary | ICD-10-CM | POA: Diagnosis not present

## 2021-07-24 DIAGNOSIS — I129 Hypertensive chronic kidney disease with stage 1 through stage 4 chronic kidney disease, or unspecified chronic kidney disease: Secondary | ICD-10-CM | POA: Diagnosis not present

## 2021-07-24 DIAGNOSIS — M6282 Rhabdomyolysis: Secondary | ICD-10-CM | POA: Diagnosis not present

## 2021-07-24 DIAGNOSIS — I2510 Atherosclerotic heart disease of native coronary artery without angina pectoris: Secondary | ICD-10-CM | POA: Diagnosis not present

## 2021-07-24 DIAGNOSIS — Z8249 Family history of ischemic heart disease and other diseases of the circulatory system: Secondary | ICD-10-CM | POA: Diagnosis not present

## 2021-07-24 DIAGNOSIS — R2681 Unsteadiness on feet: Secondary | ICD-10-CM | POA: Diagnosis not present

## 2021-07-24 DIAGNOSIS — R52 Pain, unspecified: Secondary | ICD-10-CM | POA: Diagnosis not present

## 2021-07-24 DIAGNOSIS — N179 Acute kidney failure, unspecified: Secondary | ICD-10-CM | POA: Diagnosis not present

## 2021-07-24 DIAGNOSIS — I214 Non-ST elevation (NSTEMI) myocardial infarction: Secondary | ICD-10-CM | POA: Diagnosis not present

## 2021-07-24 DIAGNOSIS — Z515 Encounter for palliative care: Secondary | ICD-10-CM | POA: Diagnosis not present

## 2021-07-24 DIAGNOSIS — U071 COVID-19: Secondary | ICD-10-CM | POA: Diagnosis not present

## 2021-07-24 DIAGNOSIS — Z20822 Contact with and (suspected) exposure to covid-19: Secondary | ICD-10-CM | POA: Diagnosis not present

## 2021-07-24 DIAGNOSIS — Z8546 Personal history of malignant neoplasm of prostate: Secondary | ICD-10-CM | POA: Diagnosis not present

## 2021-07-24 DIAGNOSIS — E11649 Type 2 diabetes mellitus with hypoglycemia without coma: Secondary | ICD-10-CM | POA: Diagnosis not present

## 2021-07-24 DIAGNOSIS — T68XXXA Hypothermia, initial encounter: Secondary | ICD-10-CM | POA: Diagnosis not present

## 2021-07-24 DIAGNOSIS — D649 Anemia, unspecified: Secondary | ICD-10-CM | POA: Diagnosis not present

## 2021-07-24 DIAGNOSIS — I639 Cerebral infarction, unspecified: Secondary | ICD-10-CM | POA: Diagnosis not present

## 2021-07-24 DIAGNOSIS — N1832 Chronic kidney disease, stage 3b: Secondary | ICD-10-CM | POA: Diagnosis not present

## 2021-07-24 DIAGNOSIS — R2689 Other abnormalities of gait and mobility: Secondary | ICD-10-CM | POA: Diagnosis not present

## 2021-07-24 DIAGNOSIS — J9601 Acute respiratory failure with hypoxia: Secondary | ICD-10-CM | POA: Diagnosis not present

## 2021-07-24 DIAGNOSIS — N189 Chronic kidney disease, unspecified: Secondary | ICD-10-CM | POA: Diagnosis not present

## 2021-07-24 DIAGNOSIS — R197 Diarrhea, unspecified: Secondary | ICD-10-CM | POA: Diagnosis not present

## 2021-07-24 DIAGNOSIS — Z87891 Personal history of nicotine dependence: Secondary | ICD-10-CM | POA: Diagnosis not present

## 2021-07-24 DIAGNOSIS — R0602 Shortness of breath: Secondary | ICD-10-CM | POA: Diagnosis not present

## 2021-07-24 DIAGNOSIS — R1312 Dysphagia, oropharyngeal phase: Secondary | ICD-10-CM | POA: Diagnosis not present

## 2021-07-24 DIAGNOSIS — J9600 Acute respiratory failure, unspecified whether with hypoxia or hypercapnia: Secondary | ICD-10-CM | POA: Diagnosis not present

## 2021-07-24 DIAGNOSIS — Z823 Family history of stroke: Secondary | ICD-10-CM | POA: Diagnosis not present

## 2021-07-24 DIAGNOSIS — N4889 Other specified disorders of penis: Secondary | ICD-10-CM | POA: Diagnosis not present

## 2021-07-24 DIAGNOSIS — E876 Hypokalemia: Secondary | ICD-10-CM | POA: Diagnosis not present

## 2021-07-24 DIAGNOSIS — R062 Wheezing: Secondary | ICD-10-CM | POA: Diagnosis not present

## 2021-07-24 DIAGNOSIS — I69351 Hemiplegia and hemiparesis following cerebral infarction affecting right dominant side: Secondary | ICD-10-CM | POA: Diagnosis not present

## 2021-07-24 DIAGNOSIS — D61818 Other pancytopenia: Secondary | ICD-10-CM | POA: Diagnosis not present

## 2021-07-24 DIAGNOSIS — I69321 Dysphasia following cerebral infarction: Secondary | ICD-10-CM | POA: Diagnosis not present

## 2021-07-24 DIAGNOSIS — J45909 Unspecified asthma, uncomplicated: Secondary | ICD-10-CM | POA: Diagnosis not present

## 2021-07-24 DIAGNOSIS — M79605 Pain in left leg: Secondary | ICD-10-CM | POA: Diagnosis not present

## 2021-07-24 DIAGNOSIS — Z28310 Unvaccinated for covid-19: Secondary | ICD-10-CM | POA: Diagnosis not present

## 2021-07-24 DIAGNOSIS — Z888 Allergy status to other drugs, medicaments and biological substances status: Secondary | ICD-10-CM | POA: Diagnosis not present

## 2021-07-24 DIAGNOSIS — R41841 Cognitive communication deficit: Secondary | ICD-10-CM | POA: Diagnosis not present

## 2021-07-24 DIAGNOSIS — E785 Hyperlipidemia, unspecified: Secondary | ICD-10-CM | POA: Diagnosis not present

## 2021-07-24 DIAGNOSIS — I69322 Dysarthria following cerebral infarction: Secondary | ICD-10-CM | POA: Diagnosis not present

## 2021-07-24 DIAGNOSIS — M79604 Pain in right leg: Secondary | ICD-10-CM | POA: Diagnosis not present

## 2021-07-24 DIAGNOSIS — I6781 Acute cerebrovascular insufficiency: Secondary | ICD-10-CM | POA: Diagnosis not present

## 2021-07-24 DIAGNOSIS — R471 Dysarthria and anarthria: Secondary | ICD-10-CM | POA: Diagnosis not present

## 2021-07-24 DIAGNOSIS — R4020 Unspecified coma: Secondary | ICD-10-CM | POA: Diagnosis not present

## 2021-07-24 DIAGNOSIS — R000 Tachycardia, unspecified: Secondary | ICD-10-CM | POA: Diagnosis not present

## 2021-07-24 DIAGNOSIS — K219 Gastro-esophageal reflux disease without esophagitis: Secondary | ICD-10-CM | POA: Diagnosis not present

## 2021-07-24 DIAGNOSIS — Z7401 Bed confinement status: Secondary | ICD-10-CM | POA: Diagnosis not present

## 2021-07-24 DIAGNOSIS — N3041 Irradiation cystitis with hematuria: Secondary | ICD-10-CM | POA: Diagnosis not present

## 2021-07-24 DIAGNOSIS — R404 Transient alteration of awareness: Secondary | ICD-10-CM | POA: Diagnosis not present

## 2021-07-24 DIAGNOSIS — L988 Other specified disorders of the skin and subcutaneous tissue: Secondary | ICD-10-CM | POA: Diagnosis not present

## 2021-07-24 DIAGNOSIS — N139 Obstructive and reflux uropathy, unspecified: Secondary | ICD-10-CM | POA: Diagnosis not present

## 2021-07-24 DIAGNOSIS — Z833 Family history of diabetes mellitus: Secondary | ICD-10-CM | POA: Diagnosis not present

## 2021-07-24 DIAGNOSIS — I959 Hypotension, unspecified: Secondary | ICD-10-CM | POA: Diagnosis not present

## 2021-07-24 DIAGNOSIS — Z7984 Long term (current) use of oral hypoglycemic drugs: Secondary | ICD-10-CM | POA: Diagnosis not present

## 2021-07-24 DIAGNOSIS — I10 Essential (primary) hypertension: Secondary | ICD-10-CM | POA: Diagnosis not present

## 2021-07-24 DIAGNOSIS — R7401 Elevation of levels of liver transaminase levels: Secondary | ICD-10-CM | POA: Diagnosis not present

## 2021-07-24 DIAGNOSIS — R262 Difficulty in walking, not elsewhere classified: Secondary | ICD-10-CM | POA: Diagnosis not present

## 2021-07-24 DIAGNOSIS — M6281 Muscle weakness (generalized): Secondary | ICD-10-CM | POA: Diagnosis not present

## 2021-07-24 DIAGNOSIS — U099 Post covid-19 condition, unspecified: Secondary | ICD-10-CM | POA: Diagnosis not present

## 2021-07-24 DIAGNOSIS — N390 Urinary tract infection, site not specified: Secondary | ICD-10-CM | POA: Diagnosis not present

## 2021-07-24 DIAGNOSIS — E119 Type 2 diabetes mellitus without complications: Secondary | ICD-10-CM | POA: Diagnosis not present

## 2021-07-24 DIAGNOSIS — Z79899 Other long term (current) drug therapy: Secondary | ICD-10-CM | POA: Diagnosis not present

## 2021-07-24 DIAGNOSIS — K2100 Gastro-esophageal reflux disease with esophagitis, without bleeding: Secondary | ICD-10-CM | POA: Diagnosis not present

## 2021-07-24 DIAGNOSIS — D696 Thrombocytopenia, unspecified: Secondary | ICD-10-CM | POA: Diagnosis not present

## 2021-07-24 DIAGNOSIS — M6259 Muscle wasting and atrophy, not elsewhere classified, multiple sites: Secondary | ICD-10-CM | POA: Diagnosis not present

## 2021-07-24 DIAGNOSIS — I69328 Other speech and language deficits following cerebral infarction: Secondary | ICD-10-CM | POA: Diagnosis not present

## 2021-07-24 LAB — CBC
HCT: 28.8 % — ABNORMAL LOW (ref 39.0–52.0)
Hemoglobin: 9.3 g/dL — ABNORMAL LOW (ref 13.0–17.0)
MCH: 28.8 pg (ref 26.0–34.0)
MCHC: 32.3 g/dL (ref 30.0–36.0)
MCV: 89.2 fL (ref 80.0–100.0)
Platelets: 78 10*3/uL — ABNORMAL LOW (ref 150–400)
RBC: 3.23 MIL/uL — ABNORMAL LOW (ref 4.22–5.81)
RDW: 14.7 % (ref 11.5–15.5)
WBC: 5.9 10*3/uL (ref 4.0–10.5)
nRBC: 0 % (ref 0.0–0.2)

## 2021-07-24 LAB — COMPREHENSIVE METABOLIC PANEL
ALT: 237 U/L — ABNORMAL HIGH (ref 0–44)
AST: 106 U/L — ABNORMAL HIGH (ref 15–41)
Albumin: 2.2 g/dL — ABNORMAL LOW (ref 3.5–5.0)
Alkaline Phosphatase: 77 U/L (ref 38–126)
Anion gap: 9 (ref 5–15)
BUN: 42 mg/dL — ABNORMAL HIGH (ref 8–23)
CO2: 22 mmol/L (ref 22–32)
Calcium: 7.8 mg/dL — ABNORMAL LOW (ref 8.9–10.3)
Chloride: 106 mmol/L (ref 98–111)
Creatinine, Ser: 1.35 mg/dL — ABNORMAL HIGH (ref 0.61–1.24)
GFR, Estimated: 54 mL/min — ABNORMAL LOW (ref >60)
Glucose, Bld: 117 mg/dL — ABNORMAL HIGH (ref 70–99)
Potassium: 3.4 mmol/L — ABNORMAL LOW (ref 3.5–5.1)
Sodium: 137 mmol/L (ref 135–145)
Total Bilirubin: 0.8 mg/dL (ref 0.3–1.2)
Total Protein: 5.2 g/dL — ABNORMAL LOW (ref 6.5–8.1)

## 2021-07-24 LAB — GLUCOSE, CAPILLARY
Glucose-Capillary: 112 mg/dL — ABNORMAL HIGH (ref 70–99)
Glucose-Capillary: 122 mg/dL — ABNORMAL HIGH (ref 70–99)
Glucose-Capillary: 126 mg/dL — ABNORMAL HIGH (ref 70–99)
Glucose-Capillary: 135 mg/dL — ABNORMAL HIGH (ref 70–99)

## 2021-07-24 LAB — CK: Total CK: 494 U/L — ABNORMAL HIGH (ref 49–397)

## 2021-07-24 LAB — PHOSPHORUS: Phosphorus: 3.3 mg/dL (ref 2.5–4.6)

## 2021-07-24 MED ORDER — POTASSIUM CHLORIDE CRYS ER 20 MEQ PO TBCR
40.0000 meq | EXTENDED_RELEASE_TABLET | ORAL | Status: DC
  Administered 2021-07-24: 40 meq via ORAL
  Filled 2021-07-24: qty 2

## 2021-07-24 MED ORDER — DICYCLOMINE HCL 10 MG PO CAPS: 10.0000 mg | ORAL_CAPSULE | Freq: Three times a day (TID) | ORAL | Status: AC

## 2021-07-24 MED ORDER — POTASSIUM CHLORIDE CRYS ER 20 MEQ PO TBCR: 20.0000 meq | EXTENDED_RELEASE_TABLET | Freq: Every day | ORAL | Status: AC

## 2021-07-24 MED ORDER — CARVEDILOL 12.5 MG PO TABS: 12.5000 mg | ORAL_TABLET | Freq: Two times a day (BID) | ORAL | Status: AC

## 2021-07-24 NOTE — TOC Progression Note (Signed)
Transition of Care (TOC) - Progression Note  ? ? ?Patient Details  ?Name: Duane Hill. ?MRN: [id number] ?Date of Birth: [birth year] ? ?Transition of Care (TOC) CM/SW Contact  ?Conception Oms, RN ?Phone Number: ?07/24/2021, 11:04 AM ? ?Clinical Narrative:   Reviewed the bed offers with the patient and he chose [facility], I notified Ricky at [facility] ? ? ? ?Expected Discharge Plan: [place] ?Barriers to Discharge: Continued Medical Work up ? ?Expected Discharge Plan and Services ?Expected Discharge Plan: [place] ?  ?Discharge Planning Services: CM Consult ?Post Acute Care Choice: [place] ?Living arrangements for the past 2 months: [place] ?                ?DME Arranged: N/A ?DME Agency: NA ?  ?  ?  ?HH Arranged: NA ?  ?  ?  ?  ? ? ?Social Determinants of Health (SDOH) Interventions ?  ? ?Readmission Risk Interventions ?   ? View : No data to display.  ?  ?  ?  ? ? ?

## 2021-07-24 NOTE — TOC Progression Note (Signed)
Transition of Care (TOC) - Progression Note  ? ? ?Patient Details  ?Name: Duane Hill. ?MRN: [id number] ?Date of Birth: October 08, 1940 ? ?Transition of Care (TOC) CM/SW Contact  ?Conception Oms, RN ?Phone Number: ?07/24/2021, 12:46 PM ? ?Clinical Narrative:    ? ?Patient going to E7 at [facility], EMS called to transport  ?Bedside Nurse to call report  ? ? ?Expected Discharge Plan: [place] ?Barriers to Discharge: Continued Medical Work up ? ?Expected Discharge Plan and Services ?Expected Discharge Plan: [place] ?  ?Discharge Planning Services: CM Consult ?Post Acute Care Choice: [place] ?Living arrangements for the past 2 months: [place] ?Expected Discharge Date: 07/24/21               ?DME Arranged: N/A ?DME Agency: NA ?  ?  ?  ?HH Arranged: NA ?  ?  ?  ?  ? ? ?Social Determinants of Health (SDOH) Interventions ?  ? ?Readmission Risk Interventions ?   ? View : No data to display.  ?  ?  ?  ? ? ?

## 2021-07-24 NOTE — Care Management Important Message (Signed)
Important Message ? ?Patient Details  ?Name: Duane Hill. ?MRN: [id number] ?Date of Birth: Feb 24, 1941 ? ? ?Medicare Important Message Given:  Yes ? ? ? ? ?[name] ?07/24/2021, 2:00 PM ?

## 2021-07-24 NOTE — Progress Notes (Signed)
Report called to [place] at [place].  ?

## 2021-07-24 NOTE — Progress Notes (Signed)
?[facility]  ?ROUNDING NOTE  ? ?Subjective:  ? ?Pt resting comfortably.  ?Cr about the same at 1.35.  ?Good UOP noted.  ? ?04/06 0701 - 04/07 0700 ?In: -  ?Out: 2900 [Urine:2900] ?Lab Results  ?Component Value Date  ? CREATININE 1.35 (H) 07/24/2021  ? CREATININE 1.38 (H) 07/23/2021  ? CREATININE 2.04 (H) 07/22/2021  ?  ? ?Objective:  ?Vital signs in last 24 hours:  ?Temp:  [98.1 ?F (36.7 ?C)-98.6 ?F (37 ?C)] 98.5 ?F (36.9 ?C) (04/07 1452) ?Pulse Rate:  [59-69] 59 (04/07 1452) ?Resp:  [16-18] 16 (04/07 1452) ?BP: (109-145)/(52-64) 109/52 (04/07 1452) ?SpO2:  [97 %-99 %] 99 % (04/07 1452) ? ?Weight change:  ?Filed Weights  ? 07/19/21 2200  ?Weight: 67.8 kg  ? ? ?Intake/Output: ?I/O last 3 completed shifts: ?In: 240 [P.O.:240] ?Out: 3800 [Urine:3800] ?  ?Intake/Output this shift: ? No intake/output data recorded. ? ?Physical Exam: ?General: NAD  ?Head: Normocephalic, atraumatic. Moist oral mucosal membranes  ?Eyes: Anicteric  ?Neck: Supple  ?Lungs:  Clear bilaterally, normal effort, room air  ?Heart: S1S2 no rubs  ?Abdomen:  Soft, nontender, bowel sounds present  ?Extremities: No peripheral edema.  ?Neurologic: Awake, alert, following commands  ?Skin: No acute rash  ?Access: No hemodialysis access  ? ? ?Basic Metabolic Panel: ?Recent Labs  ?Lab 07/20/21 ?0424 07/20/21 ?6712 07/21/21 ?4580 07/21/21 ?9983 07/21/21 ?3825 07/21/21 ?1913 07/22/21 ?0429 07/22/21 ?1757 07/23/21 ?0444 07/24/21 ?0330  ?NA 142   < > 140 142  --   --  137  --  136 137  ?K 3.2*   < > 3.2* 3.1*  3.1* 3.1* 3.4* 3.4* 3.6 3.4* 3.4*  ?CL 91*   < > 96* 97*  --   --  102  --  107 106  ?CO2 34*   < > 29 27  --   --  25  --  22 22  ?GLUCOSE 260*   < > 145* 132*  --   --  115*  --  87 117*  ?BUN 81*   < > 77* 73*  --   --  59*  --  46* 42*  ?CREATININE 5.11*   < > 3.70* 3.34*  --   --  2.04*  --  1.38* 1.35*  ?CALCIUM 6.6*   < > 6.5* 6.8*  --   --  7.4*  --  7.7* 7.8*  ?MG 2.3  --   --   --  2.3  --   --   --  2.0  --   ?PHOS 6.4*  --   --    --   --   --  2.8  --  2.2* 3.3  ? < > = values in this interval not displayed.  ? ? ? ?Liver Function Tests: ?Recent Labs  ?Lab 07/20/21 ?0424 07/21/21 ?0420 07/22/21 ?0429 07/23/21 ?0444 07/24/21 ?0330  ?AST 1,457*  1,438* 630* 323* 164* 106*  ?ALT 713*  11 728* 548* 341* 237*  ?ALKPHOS 37*  37* 43 51 67 77  ?BILITOT 0.8  0.9 0.8 0.7 0.8 0.8  ?PROT 5.2*  5.2* 5.3* 5.4* 5.3* 5.2*  ?ALBUMIN 2.7*  2.5* 2.3* 2.2* 2.3* 2.2*  ? ? ?Recent Labs  ?Lab 07/20/21 ?0424  ?LIPASE 16  ? ? ?Recent Labs  ?Lab 07/20/21 ?0539  ?AMMONIA 28  ? ? ? ?CBC: ?Recent Labs  ?Lab 07/19/21 ?1053 07/19/21 ?1901 07/20/21 ?0424 07/20/21 ?1101 07/20/21 ?1713 07/21/21 ?0420 07/22/21 ?7673 07/23/21 ?4193 07/24/21 ?0330  ?  WBC 8.9  --  2.7*  --   --  3.2* 4.1 6.0 5.9  ?NEUTROABS 5.5  --   --   --   --   --   --   --   --   ?HGB 7.6*   < > 9.8*   < > 9.6* 9.0* 8.7* 9.1* 9.3*  ?HCT 24.3*   < > 28.5*   < > 28.0* 27.3* 26.7* 27.5* 28.8*  ?MCV 93.1  --  85.3  --   --  87.8 87.8 87.9 89.2  ?PLT 146*  --  86*  --   --  52* 47* 57* 78*  ? < > = values in this interval not displayed.  ? ? ? ?Cardiac Enzymes: ?Recent Labs  ?Lab 07/20/21 ?0424 07/21/21 ?0420 07/22/21 ?0429 07/24/21 ?0330  ?CKTOTAL 6,284* 3,778* 1,864* 494*  ? ? ? ?BNP: ?Invalid input(s): POCBNP ? ?CBG: ?Recent Labs  ?Lab 07/23/21 ?1949 07/24/21 ?0015 07/24/21 ?3276 07/24/21 ?1470 07/24/21 ?1217  ?GLUCAP 124* 135* 112* 122* 126*  ? ? ? ?Microbiology: ?Results for orders placed or performed during the hospital encounter of 07/19/21  ?Resp Panel by RT-PCR (Flu A&B, Covid) Nasopharyngeal Swab     Status: None  ? Collection Time: 07/19/21 10:54 AM  ? Specimen: Nasopharyngeal Swab; Nasopharyngeal(NP) swabs in vial transport medium  ?Result Value Ref Range Status  ? SARS Coronavirus 2 by RT PCR NEGATIVE NEGATIVE Final  ?  Comment: (NOTE) ?SARS-CoV-2 target nucleic acids are NOT DETECTED. ? ?The SARS-CoV-2 RNA is generally detectable in upper respiratory ?specimens during the acute phase of  infection. The lowest ?concentration of SARS-CoV-2 viral copies this assay can detect is ?138 copies/mL. A negative result does not preclude SARS-Cov-2 ?infection and should not be used as the sole basis for treatment or ?other patient management decisions. A negative result may occur with  ?improper specimen collection/handling, submission of specimen other ?than nasopharyngeal swab, presence of viral mutation(s) within the ?areas targeted by this assay, and inadequate number of viral ?copies(<138 copies/mL). A negative result must be combined with ?clinical observations, patient history, and epidemiological ?information. The expected result is Negative. ? ?Fact Sheet for Patients:  ?EntrepreneurPulse.com.au ? ?Fact Sheet for Healthcare Providers:  ?IncredibleEmployment.be ? ?This test is no t yet approved or cleared by the [place] FDA and  ?has been authorized for detection and/or diagnosis of SARS-CoV-2 by ?FDA under an Emergency Use Authorization (EUA). This EUA will remain  ?in effect (meaning this test can be used) for the duration of the ?COVID-19 declaration under Section 564(b)(1) of the Act, 21 ?U.S.C.section 360bbb-3(b)(1), unless the authorization is terminated  ?or revoked sooner.  ? ? ?  ? Influenza A by PCR NEGATIVE NEGATIVE Final  ? Influenza B by PCR NEGATIVE NEGATIVE Final  ?  Comment: (NOTE) ?The Xpert Xpress SARS-CoV-2/FLU/RSV plus assay is intended as an aid ?in the diagnosis of influenza from Nasopharyngeal swab specimens and ?should not be used as a sole basis for treatment. Nasal washings and ?aspirates are unacceptable for Xpert Xpress SARS-CoV-2/FLU/RSV ?testing. ? ?Fact Sheet for Patients: ?EntrepreneurPulse.com.au ? ?Fact Sheet for Healthcare Providers: ?IncredibleEmployment.be ? ?This test is not yet approved or cleared by the [place] FDA and ?has been authorized for detection and/or diagnosis of SARS-CoV-2  by ?FDA under an Emergency Use Authorization (EUA). This EUA will remain ?in effect (meaning this test can be used) for the duration of the ?COVID-19 declaration under Section 564(b)(1) of the Act, 21 U.S.C. ?section 360bbb-3(b)(1), unless the  authorization is terminated or ?revoked. ? ?Performed at [facility], [place], ?[place] [address] ?  ?MRSA Next Gen by PCR, Nasal     Status: None  ? Collection Time: 07/19/21  6:26 PM  ? Specimen: Nasal Mucosa; Nasal Swab  ?Result Value Ref Range Status  ? MRSA by PCR Next Gen NOT DETECTED NOT DETECTED Final  ?  Comment: (NOTE) ?The GeneXpert MRSA Assay (FDA approved for NASAL specimens only), ?is one component of a comprehensive MRSA colonization surveillance ?program. It is not intended to diagnose MRSA infection nor to guide ?or monitor treatment for MRSA infections. ?Test performance is not FDA approved in patients less than 2 years ?old. ?Performed at [facility], [place], ?[place] [address] ?  ?CULTURE, BLOOD (ROUTINE X 2) w Reflex to ID Panel     Status: None (Preliminary result)  ? Collection Time: 07/20/21  8:31 AM  ? Specimen: BLOOD  ?Result Value Ref Range Status  ? Specimen Description BLOOD BLOOD RIGHT HAND  Final  ? Special Requests   Final  ?  BOTTLES DRAWN AEROBIC AND ANAEROBIC Blood Culture adequate volume  ? Culture   Final  ?  NO GROWTH 4 DAYS ?Performed at [facility], [address], [place], [place] [address] ?  ? Report Status PENDING  Incomplete  ?CULTURE, BLOOD (ROUTINE X 2) w Reflex to ID Panel     Status: None (Preliminary result)  ? Collection Time: 07/20/21  8:31 AM  ? Specimen: BLOOD  ?Result Value Ref Range Status  ? Specimen Description BLOOD BLOOD LEFT HAND  Final  ? Special Requests   Final  ?  BOTTLES DRAWN AEROBIC AND ANAEROBIC Blood Culture adequate volume  ? Culture   Final  ?  NO GROWTH 4 DAYS ?Performed at [facility], [address], [place], [place]  [address] ?  ? Report Status PENDING  Incomplete  ?Urine Culture     Status: None  ? Collection Time: 07/21/21 12:22 PM  ? Specimen: Urine, Catheterized  ?Result Value Ref Range Status  ? Specimen Description   Final  ?  U

## 2021-07-24 NOTE — Consult Note (Signed)
[facility] CM Inpatient Consult ? ? ?07/24/2021 ? ?[name]. ?[birth year] ?[id number] ? ?[place] [facility] CM) ? ?Patient chart reviewed with noted high risk score for unplanned readmission. Assessed for post hospital chronic care management needs. Per review, patient is active with embedded chronic care management team at primary provider office. Current disposition is for SNF.  ? ?Plan: Will notify embedded CCM team of patient progress and disposition. ? ?Of note, [facility] Care Management services does not replace or interfere with any services that are arranged by inpatient case management or social work.  ? ?Netta Cedars, MSN, RN ?[facility] Liaison ?Phone [phone] ?Toll free office [phone]  ?

## 2021-07-24 NOTE — Plan of Care (Signed)

## 2021-07-24 NOTE — Discharge Summary (Addendum)
?Physician Discharge Summary ?  ?Patient: Duane Hill. MRN: [id number] DOB: Feb 20, 1941  ?Admit date:     07/19/2021  ?Discharge date: 08/11/21  ?Discharge Physician: Ezekiel Slocumb  ? ?PCP: Jon Billings, NP  ? ?Recommendations at discharge:  ? ? Follow up with Primary Care in 1-2 weeks ?Repeat CMP, Mg, CBC in 1-2 weeks ?Follow up with Urology for voiding trial in 2 weeks ?Flush Foley catheter as needed for clots ? ? ?Discharge Diagnoses: ?Principal Problem: ?  Symptomatic anemia secondary to penile bleed ?Active Problems: ?  Bleeding of penis likely d/t hx radiation cystitis (hx prostate CA), possible prostate CA recurrence  ?  TIA (transient ischemic attack) ?  Acute renal failure ([place]) ?  Dysarthria ?  Rhabdomyolysis ?  Transaminitis ?  Thrombocytopenia ([place]) ?  Pancytopenia ([place]) ?  Generalized weakness ?  Hypokalemia ?  Type 2 diabetes mellitus with hyperglycemia (HCC) ?  NSTEMI (non-ST elevated myocardial infarction) ([place]) ?  Chronic radiation cystitis ?  Hypertension associated with diabetes ([place]) ?  GERD (gastroesophageal reflux disease) ?  History of prostate cancer ?  Advanced care planning/counseling discussion ?  Pressure injury of skin ?  Sepsis ([place]) ? ?Resolved Problems: ?  Hyperkalemia ?  Hypophosphatemia ?  Acute respiratory failure with hypoxia ([place]) ?  Hypoxia, improved ? ?Hospital Course: ?Taken from H&P and ICU notes:  ?Patient presented to ED 07/19/2021 chief complaint of generalized weakness, severe penile bleeding, sudden onset slurred speech difficulty swallowing this morning with last known well the night prior when he went to bed.  Penile bleeding onset was to 4 days ago began worsening and copious bleeding, but no pain, starting 3 days ago.  He has a history of prostate cancer which was treated with radiation and seed placement into the prostate. ?  ?ED course: ... hemoglobin 7.6, thrombocytopenic with platelets 146, AKI/ARF with creatinine 6.68, hyperkalemic with potassium 6.9,  troponin 77, AST 129 and ALT 47, EKG showed no significant abnormality, CT head showed no acute findings, CT abdomen/pelvis/chest showed hydroureteronephrosis.  ED was in consultation with neurology, urology, nephrology.  Patient was started on continuous bladder irrigation, treated for hyperkalemia and repeat potassium was improved to 5.3.,  Ordered 1 unit PRBC...  He was initially hypoxic but improved, taken off O2 via [place]. ?  ?Procedures and Significant Results:  ?Continuous bladder irrigation initiated 07/19/21  ?Transfusion 1 unit PRBC's ?CXR 07/19/2021 ?CT head without contrast 07/19/2021: Increased ventricular size with possible mild hydrocephalus, atrophy, atherosclerotic disease noted in carotid arteries ?CT chest/abdomen/pelvis 07/19/2021: Enlarged lymph nodes in chest, hydroureteronephrosis, questionable right lower lung infection ? ?See below for remaining hospital course and outcomes. ? ?Patient is stable on room air, tolerating diet.  His dysarthria appears resolved and Right arm strength recovered.  Renal function and LFT's continue to improve, CK nearly normalized.  Stable vitals.  Clinically improved and stable for discharge to SNF for short-term rehab today. ? ? ?Assessment and Plan: ?* Symptomatic anemia secondary to penile bleed ?POA, due to hematuria.    ?Hgb on presentation 07/19/21 was 7.6 ?PRBC transfusion initiated by ED. ?Hemoglobin stable at 9.3.   ?Urine remains pink-tinged but hemoglobin has been stable and improving for several days. ?--Monitor CBC at follow-up ?--Transfuse for Hbg>7 or hemodynamic instability with active bleeding ? ?Acute renal failure ([place]) ?Multifactorial due to obstructive uropathy, rhabdomyolysis, hypotension. ?Creatinine on admission 6.68. ?Renal function improving - Cr 1.35 ?--Nephrology and Urology consulted ?--Foley placed, now off CBI  ?--Avoid nephrotoxic agents ?--Monitor  urine output ?--Repeat labs in 1-2 weeks ?--Maintain MAP>65 ?--Renal dose medications ? ?TIA  (transient ischemic attack) ?Presented with a right-sided hemiparesis and dysarthria.  Possibly symptomatic hypoperfusion secondary to hypotension, anemia and sepsis.  Deficits improving. ? ?STROKE RULED OUT. ?No acute infarct seen on MRI.   ?Severe left PCA stenosis may be culprit lesion (but did not have visual symptoms initially).  ? ?-- Start ASA when thrombocytopenia is better ?--Neurology consulted, signed off ? ?Bleeding of penis likely d/t hx radiation cystitis (hx prostate CA), possible prostate CA recurrence  ?Urology following - recommendations: ?Manually irrigate Foley catheter as needed for clots ?Continue Foley catheter with plans for outpatient voiding trial in 2 weeks ?Outpatient cystoscopy ? ?Dysarthria ?POA, speech has markedly improved.   ?Due to stroke. ?Neurology consulted. ?SLP cleared for dysphagia 3 diet. ?Unable to start aspirin at this time due to thrombocytopenia. ? ?Hypokalemia ?K 3.4 again this AM despite replacement. ?Replaced with K-Cl and K-phos. ?--Discharge on K 20 mEq daily ?--Repeat labs in 1 to 2 weeks ? ?Generalized weakness ?PT/OT evaluated, recommend SNF for rehab.    Patient is medically stable for discharge for ongoing rehab at SNF ? ?Pancytopenia ([place]) ?Possibly developing in setting sepsis and acute blood loss anemia.  ?Platelet count has improved to 77. ?Hemoglobin stable at 9.3 ?--Repeat CBC in 1 to 2 weeks  ?-- Consider hematology referral for further evaluation as outpatient if persistent ? ?Thrombocytopenia ([place]) ?Stable. Unclear etiology. ?--Hold heparin products ?--Hold off aspirin as recommended by neurology for secondary stroke prophylaxis ?--Monitor CBC at follow-up ? ?Plt count 47k>>57k>> 77 today ? ?Transaminitis ?POA, improving.  Suspected due to shock liver and rhabdomyolysis.   ?Ammonia normal. ?Rhabdomyolysis improved with fluids. ?LFTs have been improving steadily ?-- Repeat CMP in 1 to 2 weeks.   ? ?RUQ U/S on 4/3: ?1. Cholelithiasis without definite  evidence of acute cholecystitis. Clinical correlation is suggested. If there is high clinical concern HIDA scan could be considered for further evaluation. 2. Hepatic steatosis ? ?Rhabdomyolysis ?CK  3778 on admission, improved with IV fluids.  Encourage oral hydration. ? ?Hypophosphatemia-resolved as of 07/24/2021 ?Phos 2.2 yesterday, replaced with K-phos. ?Phos, Recheck in follow-up and further replace if needed. ? ?Hyperkalemia-resolved as of 07/24/2021 ?POA, resolved.  Due to AKI. ?Monitor BMP. ? ?NSTEMI (non-ST elevated myocardial infarction) ([place]) ?Type 2 non-stemi due to demand ischemia, not ACS.  No chest pain or acute ECG changes. ?Cardiology consulted. ?No inpatient cardiac diagnostics recommended. ? ?Type 2 diabetes mellitus with hyperglycemia ([place]) ?Covered during admission with sliding scale NovoLog. ?Hemoglobin A1c 6.3%.  Resume home metformin and glipizide at discharge.  Continue to monitor CBGs. ?PCP follow-up in 1 to 2 weeks. ? ?Acute respiratory failure with hypoxia (HCC)-resolved as of 07/24/2021 ?POA, resolved.   ?Required HFNC at flow rate as high as 35 l/min.   Weaned off oxygen and stable on room air. ? ? ?Chronic radiation cystitis ?Presented with hematuria.   ?Foley in place, now off CBI ?Mgmt as outlined. ? ?Sepsis ([place]) ?RULED OUT.   ?Presented with tachypnea, leukopenia. ?Unclear source, possible UTI but urine culture no growth.  Treated with empiric Rocephin. ?Stop Rocephin. ? ?RUQ U/S with cholelithiasis, no acute cholecystitis. Hepatic steatosis. ? ?Pressure injury of skin ?POA.  I agree with the wound description as outlined below. ?--frequent repositioning ?--diligent local wound care ?--monitor closely for signs of infection ? ?Pressure Injury 07/20/21 Sacrum Mid Stage 2 -  Partial thickness loss of dermis presenting as a shallow open injury  with a red, pink wound bed without slough. (Active)  ?07/20/21 1124  ?Location: Sacrum  ?Location Orientation: Mid  ?Staging: Stage 2 -  Partial  thickness loss of dermis presenting as a shallow open injury with a red, pink wound bed without slough.  ?Wound Description (Comments):   ?Present on Admission:   ? ? ? ? ?Advanced care planning/counseling disc

## 2021-07-25 LAB — CULTURE, BLOOD (ROUTINE X 2)
Culture: NO GROWTH
Culture: NO GROWTH
Special Requests: ADEQUATE
Special Requests: ADEQUATE

## 2021-07-28 DIAGNOSIS — J309 Allergic rhinitis, unspecified: Secondary | ICD-10-CM | POA: Diagnosis not present

## 2021-07-28 DIAGNOSIS — R52 Pain, unspecified: Secondary | ICD-10-CM | POA: Diagnosis not present

## 2021-07-28 DIAGNOSIS — E11649 Type 2 diabetes mellitus with hypoglycemia without coma: Secondary | ICD-10-CM | POA: Diagnosis not present

## 2021-07-28 DIAGNOSIS — I214 Non-ST elevation (NSTEMI) myocardial infarction: Secondary | ICD-10-CM | POA: Diagnosis not present

## 2021-07-28 DIAGNOSIS — R471 Dysarthria and anarthria: Secondary | ICD-10-CM | POA: Diagnosis not present

## 2021-07-28 DIAGNOSIS — K2100 Gastro-esophageal reflux disease with esophagitis, without bleeding: Secondary | ICD-10-CM | POA: Diagnosis not present

## 2021-07-28 DIAGNOSIS — M6282 Rhabdomyolysis: Secondary | ICD-10-CM | POA: Diagnosis not present

## 2021-07-28 DIAGNOSIS — N3041 Irradiation cystitis with hematuria: Secondary | ICD-10-CM | POA: Diagnosis not present

## 2021-07-28 DIAGNOSIS — I69351 Hemiplegia and hemiparesis following cerebral infarction affecting right dominant side: Secondary | ICD-10-CM | POA: Diagnosis not present

## 2021-07-28 DIAGNOSIS — R7401 Elevation of levels of liver transaminase levels: Secondary | ICD-10-CM | POA: Diagnosis not present

## 2021-07-28 DIAGNOSIS — D61818 Other pancytopenia: Secondary | ICD-10-CM | POA: Diagnosis not present

## 2021-07-28 DIAGNOSIS — L89152 Pressure ulcer of sacral region, stage 2: Secondary | ICD-10-CM | POA: Diagnosis not present

## 2021-07-28 DIAGNOSIS — E785 Hyperlipidemia, unspecified: Secondary | ICD-10-CM | POA: Diagnosis not present

## 2021-07-28 DIAGNOSIS — J9601 Acute respiratory failure with hypoxia: Secondary | ICD-10-CM | POA: Diagnosis not present

## 2021-07-28 DIAGNOSIS — Z8546 Personal history of malignant neoplasm of prostate: Secondary | ICD-10-CM | POA: Diagnosis not present

## 2021-07-28 DIAGNOSIS — I69322 Dysarthria following cerebral infarction: Secondary | ICD-10-CM | POA: Diagnosis not present

## 2021-07-28 DIAGNOSIS — I10 Essential (primary) hypertension: Secondary | ICD-10-CM | POA: Diagnosis not present

## 2021-07-28 DIAGNOSIS — I6781 Acute cerebrovascular insufficiency: Secondary | ICD-10-CM | POA: Diagnosis not present

## 2021-07-28 DIAGNOSIS — N4889 Other specified disorders of penis: Secondary | ICD-10-CM | POA: Diagnosis not present

## 2021-07-28 DIAGNOSIS — N139 Obstructive and reflux uropathy, unspecified: Secondary | ICD-10-CM | POA: Diagnosis not present

## 2021-07-28 DIAGNOSIS — M6281 Muscle weakness (generalized): Secondary | ICD-10-CM | POA: Diagnosis not present

## 2021-07-28 DIAGNOSIS — D696 Thrombocytopenia, unspecified: Secondary | ICD-10-CM | POA: Diagnosis not present

## 2021-07-28 DIAGNOSIS — D62 Acute posthemorrhagic anemia: Secondary | ICD-10-CM | POA: Diagnosis not present

## 2021-07-30 DIAGNOSIS — L988 Other specified disorders of the skin and subcutaneous tissue: Secondary | ICD-10-CM | POA: Diagnosis not present

## 2021-08-03 NOTE — Progress Notes (Incomplete)
? ?08/03/21 ?7:40 PM  ? ?Duane Hill. ?[birth year] ?[id number] ? ?Referring provider:  ?Jon Billings, NP ?[address] ?[place],  [place] [address] ?No chief complaint on file. ? ? ?HPI: ?Duane Hill. is a 81 y.o.male with a personal history of prostate cancer, gross hematuria, and urinary retention who presents today for further evaluation of prostate cancer.  ? ?He was last seen in clinic on 10/02/2014. At which time ha had difficulty voiding he was catheterized  ? ?He was diagnosed with Gleason 4+4 prostate cancer, iPSA 11, T1c in 2013, prostate volume 96 cc. He is s/p external beam radiation with brachytherapy boost approximately  in 2013.  ? ?His most recent imaging was on 07/19/2021 in the form of CT abdomen and pelvis it visualized mild bilateral hydroureteronephrosis to a distended bladder is noted without obstructing cause identified. Perinephric stranding is noted bilaterally, slightly increased from 2016. No other significant renal or adrenal abnormalities are present. ? ? ? ?PMH: ?Past Medical History:  ?Diagnosis Date  ? Acute respiratory failure with hypoxia ([place])   ? Arthritis   ? Asthma   ? Inhaler as needed  ? Dupuytren contracture   ? GERD (gastroesophageal reflux disease)   ? Hyperkalemia 07/19/2021  ? Hyperlipidemia   ? Hypertension   ? Hypophosphatemia 07/23/2021  ? Hypoxia, improved 07/19/2021  ? Radiation proctitis   ? Renal insufficiency   ? ? ?Surgical History: ?Past Surgical History:  ?Procedure Laterality Date  ? PROSTATE SURGERY    ? ? ?Home Medications:  ?Allergies as of 08/05/2021   ? ?   Reactions  ? Protonix [pantoprazole Sodium] Nausea And Vomiting  ? ?  ? ?  ?Medication List  ?  ? ?  ? Accurate as of August 03, 2021  7:40 PM. If you have any questions, ask your nurse or doctor.  ?  ?  ? ?  ? ?acetaminophen 500 MG tablet ?Commonly known as: TYLENOL ?Take 1,000 mg by mouth every 6 (six) hours as needed. Patient uss Caplet extra strength ?  ?albuterol 108 (90 Base) MCG/ACT  inhaler ?Commonly known as: VENTOLIN HFA ?Take 2 puffs every 4-6 hours as necessary for shortness of breath ?  ?atorvastatin 20 MG tablet ?Commonly known as: LIPITOR ?Take 1 tablet (20 mg total) by mouth daily. ?  ?Azelastine HCl 0.15 % Soln ?Place 2 sprays into both nostrils 2 (two) times daily. Place 2 sprays into both nostrils 2 (two) times daily. ?  ?carvedilol 12.5 MG tablet ?Commonly known as: COREG ?Take 1 tablet (12.5 mg total) by mouth 2 (two) times daily with a meal. ?  ?dicyclomine 10 MG capsule ?Commonly known as: BENTYL ?Take 1 capsule (10 mg total) by mouth 3 (three) times daily before meals. ?  ?Fenofibric Acid 135 MG Cpdr ?Take 1 capsule by mouth daily. ?  ?fluticasone 50 MCG/ACT nasal spray ?Commonly known as: FLONASE ?Place 1 spray into both nostrils 2 (two) times daily. ?  ?gabapentin 300 MG capsule ?Commonly known as: NEURONTIN ?Take 2 capsules (600 mg total) by mouth 2 (two) times daily. ?  ?glipiZIDE 5 MG tablet ?Commonly known as: GLUCOTROL ?Take 5 mg by mouth 2 (two) times daily before a meal. ?  ?glucose blood test strip ?CHECK BLOOD SUGAR TWICE DAILY. ?  ?metFORMIN 500 MG 24 hr tablet ?Commonly known as: GLUCOPHAGE-XR ?Take 1 tablet (500 mg total) by mouth in the morning and at bedtime. ?  ?Microlet Lancets Misc ?USE TWICE DAILY AS DIRECTED. ?  ?  omeprazole 40 MG capsule ?Commonly known as: PRILOSEC ?Take 1 capsule (40 mg total) by mouth daily. ?  ?potassium chloride SA 20 MEQ tablet ?Commonly known as: KLOR-CON M ?Take 1 tablet (20 mEq total) by mouth daily. ?  ?vitamin B-12 1000 MCG tablet ?Commonly known as: CYANOCOBALAMIN ?Take 1 tablet (1,000 mcg total) by mouth daily. ?  ? ?  ? ? ?Allergies:  ?Allergies  ?Allergen Reactions  ? Protonix [Pantoprazole Sodium] Nausea And Vomiting  ? ? ?Family History: ?Family History  ?Problem Relation Age of Onset  ? Heart attack Mother   ? Hypertension Father   ? Stroke Father   ? Cancer Father   ? Diabetes Father   ? Heart attack Father   ? ? ?Social  History:  reports that he quit smoking about 34 years ago. His smoking use included cigarettes. He has never used smokeless tobacco. He reports current alcohol use of about 7.0 standard drinks per week. He reports that he does not use drugs. ? ? ?Physical Exam: ?There were no vitals taken for this visit.  ?Constitutional:  Alert and oriented, No acute distress. ?HEENT: [place] AT, moist mucus membranes.  Trachea midline, no masses. ?Cardiovascular: No clubbing, cyanosis, or edema. ?Respiratory: Normal respiratory effort, no increased work of breathing. ?Skin: No rashes, bruises or suspicious lesions. ?Neurologic: Grossly intact, no focal deficits, moving all 4 extremities. ?Psychiatric: Normal mood and affect. ? ?Laboratory Data: ? ?Lab Results  ?Component Value Date  ? CREATININE 1.35 (H) 07/24/2021  ? ?Lab Results  ?Component Value Date  ? HGBA1C 6.3 (H) 07/01/2021  ? ? ?Urinalysis ? ? ?Pertinent Imaging: ? ? ? ?Assessment & Plan:   ? ? ?No follow-ups on file. ? ?I,Kailey Littlejohn,acting as a scribe for Hollice Espy, MD.,have documented all relevant documentation on the behalf of Hollice Espy, MD,as directed by  Hollice Espy, MD while in the presence of Hollice Espy, MD. ? ? ?[facility] ?[address] ?[place], [place] [address] ?[phone] ?

## 2021-08-04 DIAGNOSIS — M79604 Pain in right leg: Secondary | ICD-10-CM | POA: Diagnosis not present

## 2021-08-04 DIAGNOSIS — E11649 Type 2 diabetes mellitus with hypoglycemia without coma: Secondary | ICD-10-CM | POA: Diagnosis not present

## 2021-08-04 DIAGNOSIS — M79605 Pain in left leg: Secondary | ICD-10-CM | POA: Diagnosis not present

## 2021-08-05 ENCOUNTER — Ambulatory Visit: Payer: Medicare Other | Admitting: Nurse Practitioner

## 2021-08-05 ENCOUNTER — Ambulatory Visit: Payer: Medicare Other | Admitting: Urology

## 2021-08-12 ENCOUNTER — Ambulatory Visit (INDEPENDENT_AMBULATORY_CARE_PROVIDER_SITE_OTHER): Payer: Medicare Other

## 2021-08-12 ENCOUNTER — Telehealth: Payer: Medicare Other

## 2021-08-12 ENCOUNTER — Telehealth: Payer: Self-pay

## 2021-08-12 ENCOUNTER — Encounter: Payer: Self-pay | Admitting: Emergency Medicine

## 2021-08-12 ENCOUNTER — Emergency Department: Payer: Medicare Other

## 2021-08-12 ENCOUNTER — Inpatient Hospital Stay
Admission: EM | Admit: 2021-08-12 | Discharge: 2021-08-17 | DRG: 951 | Disposition: E | Payer: Medicare Other | Source: Skilled Nursing Facility | Attending: Osteopathic Medicine | Admitting: Osteopathic Medicine

## 2021-08-12 ENCOUNTER — Other Ambulatory Visit: Payer: Self-pay

## 2021-08-12 DIAGNOSIS — Z7984 Long term (current) use of oral hypoglycemic drugs: Secondary | ICD-10-CM

## 2021-08-12 DIAGNOSIS — E1169 Type 2 diabetes mellitus with other specified complication: Secondary | ICD-10-CM

## 2021-08-12 DIAGNOSIS — I10 Essential (primary) hypertension: Secondary | ICD-10-CM | POA: Diagnosis present

## 2021-08-12 DIAGNOSIS — T68XXXA Hypothermia, initial encounter: Secondary | ICD-10-CM | POA: Diagnosis present

## 2021-08-12 DIAGNOSIS — U099 Post covid-19 condition, unspecified: Secondary | ICD-10-CM | POA: Diagnosis present

## 2021-08-12 DIAGNOSIS — R404 Transient alteration of awareness: Secondary | ICD-10-CM | POA: Diagnosis not present

## 2021-08-12 DIAGNOSIS — Z8249 Family history of ischemic heart disease and other diseases of the circulatory system: Secondary | ICD-10-CM | POA: Diagnosis not present

## 2021-08-12 DIAGNOSIS — E119 Type 2 diabetes mellitus without complications: Secondary | ICD-10-CM | POA: Diagnosis present

## 2021-08-12 DIAGNOSIS — K219 Gastro-esophageal reflux disease without esophagitis: Secondary | ICD-10-CM | POA: Diagnosis present

## 2021-08-12 DIAGNOSIS — E1165 Type 2 diabetes mellitus with hyperglycemia: Secondary | ICD-10-CM

## 2021-08-12 DIAGNOSIS — Z515 Encounter for palliative care: Secondary | ICD-10-CM | POA: Diagnosis not present

## 2021-08-12 DIAGNOSIS — Z833 Family history of diabetes mellitus: Secondary | ICD-10-CM | POA: Diagnosis not present

## 2021-08-12 DIAGNOSIS — J9620 Acute and chronic respiratory failure, unspecified whether with hypoxia or hypercapnia: Principal | ICD-10-CM

## 2021-08-12 DIAGNOSIS — Z823 Family history of stroke: Secondary | ICD-10-CM | POA: Diagnosis not present

## 2021-08-12 DIAGNOSIS — Z888 Allergy status to other drugs, medicaments and biological substances status: Secondary | ICD-10-CM

## 2021-08-12 DIAGNOSIS — Z79899 Other long term (current) drug therapy: Secondary | ICD-10-CM

## 2021-08-12 DIAGNOSIS — N390 Urinary tract infection, site not specified: Secondary | ICD-10-CM | POA: Diagnosis present

## 2021-08-12 DIAGNOSIS — J45909 Unspecified asthma, uncomplicated: Secondary | ICD-10-CM | POA: Diagnosis present

## 2021-08-12 DIAGNOSIS — F05 Delirium due to known physiological condition: Secondary | ICD-10-CM | POA: Diagnosis present

## 2021-08-12 DIAGNOSIS — Z87891 Personal history of nicotine dependence: Secondary | ICD-10-CM

## 2021-08-12 DIAGNOSIS — I152 Hypertension secondary to endocrine disorders: Secondary | ICD-10-CM | POA: Diagnosis not present

## 2021-08-12 DIAGNOSIS — Z20822 Contact with and (suspected) exposure to covid-19: Secondary | ICD-10-CM | POA: Diagnosis present

## 2021-08-12 DIAGNOSIS — J1282 Pneumonia due to coronavirus disease 2019: Secondary | ICD-10-CM | POA: Diagnosis present

## 2021-08-12 DIAGNOSIS — E785 Hyperlipidemia, unspecified: Secondary | ICD-10-CM | POA: Diagnosis present

## 2021-08-12 DIAGNOSIS — R4020 Unspecified coma: Secondary | ICD-10-CM | POA: Diagnosis not present

## 2021-08-12 DIAGNOSIS — U071 COVID-19: Secondary | ICD-10-CM | POA: Diagnosis present

## 2021-08-12 DIAGNOSIS — J9600 Acute respiratory failure, unspecified whether with hypoxia or hypercapnia: Secondary | ICD-10-CM

## 2021-08-12 DIAGNOSIS — R0602 Shortness of breath: Secondary | ICD-10-CM | POA: Diagnosis not present

## 2021-08-12 DIAGNOSIS — I2510 Atherosclerotic heart disease of native coronary artery without angina pectoris: Secondary | ICD-10-CM | POA: Diagnosis present

## 2021-08-12 DIAGNOSIS — Z66 Do not resuscitate: Secondary | ICD-10-CM | POA: Diagnosis present

## 2021-08-12 DIAGNOSIS — N179 Acute kidney failure, unspecified: Secondary | ICD-10-CM | POA: Diagnosis not present

## 2021-08-12 DIAGNOSIS — E1159 Type 2 diabetes mellitus with other circulatory complications: Secondary | ICD-10-CM | POA: Diagnosis not present

## 2021-08-12 DIAGNOSIS — I129 Hypertensive chronic kidney disease with stage 1 through stage 4 chronic kidney disease, or unspecified chronic kidney disease: Secondary | ICD-10-CM | POA: Diagnosis not present

## 2021-08-12 DIAGNOSIS — N189 Chronic kidney disease, unspecified: Secondary | ICD-10-CM | POA: Diagnosis not present

## 2021-08-12 DIAGNOSIS — I959 Hypotension, unspecified: Secondary | ICD-10-CM | POA: Diagnosis not present

## 2021-08-12 DIAGNOSIS — R000 Tachycardia, unspecified: Secondary | ICD-10-CM | POA: Diagnosis not present

## 2021-08-12 DIAGNOSIS — Z28310 Unvaccinated for covid-19: Secondary | ICD-10-CM

## 2021-08-12 MED ORDER — ACETAMINOPHEN 325 MG PO TABS: 650.0000 mg | ORAL_TABLET | Freq: Four times a day (QID) | ORAL | Status: DC | PRN

## 2021-08-12 MED ORDER — ATROPINE SULFATE 1 % OP SOLN
4.0000 [drp] | OPHTHALMIC | Status: DC | PRN
  Administered 2021-08-13: 4 [drp] via SUBLINGUAL
  Filled 2021-08-12: qty 2

## 2021-08-12 MED ORDER — GLYCOPYRROLATE 0.2 MG/ML IJ SOLN
0.2000 mg | INTRAMUSCULAR | Status: DC | PRN
  Filled 2021-08-12: qty 1

## 2021-08-12 MED ORDER — MORPHINE SULFATE (PF) 2 MG/ML IV SOLN
1.0000 mg | INTRAVENOUS | Status: DC | PRN
  Administered 2021-08-12 (×4): 1 mg via INTRAVENOUS
  Filled 2021-08-12 (×4): qty 1

## 2021-08-12 MED ORDER — LORAZEPAM 2 MG/ML IJ SOLN
1.0000 mg | INTRAMUSCULAR | Status: DC | PRN
  Administered 2021-08-12: 1 mg via INTRAVENOUS
  Filled 2021-08-12: qty 1

## 2021-08-12 MED ORDER — HALOPERIDOL LACTATE 2 MG/ML PO CONC
0.5000 mg | ORAL | Status: DC | PRN
  Filled 2021-08-12: qty 0.3

## 2021-08-12 MED ORDER — ONDANSETRON HCL 4 MG/2ML IJ SOLN: 4.0000 mg | Freq: Four times a day (QID) | INTRAMUSCULAR | Status: DC | PRN

## 2021-08-12 MED ORDER — GLYCOPYRROLATE 1 MG PO TABS
1.0000 mg | ORAL_TABLET | ORAL | Status: DC | PRN
  Filled 2021-08-12: qty 1

## 2021-08-12 MED ORDER — ONDANSETRON 4 MG PO TBDP: 4.0000 mg | ORAL_TABLET | Freq: Four times a day (QID) | ORAL | Status: DC | PRN

## 2021-08-12 MED ORDER — BIOTENE DRY MOUTH MT LIQD
15.0000 mL | OROMUCOSAL | Status: DC | PRN
  Filled 2021-08-12: qty 15

## 2021-08-12 MED ORDER — LORAZEPAM 2 MG/ML PO CONC: 1.0000 mg | ORAL | Status: DC | PRN

## 2021-08-12 MED ORDER — HALOPERIDOL LACTATE 5 MG/ML IJ SOLN: 0.5000 mg | INTRAMUSCULAR | Status: DC | PRN

## 2021-08-12 MED ORDER — LORAZEPAM 1 MG PO TABS: 1.0000 mg | ORAL_TABLET | ORAL | Status: DC | PRN

## 2021-08-12 MED ORDER — ACETAMINOPHEN 650 MG RE SUPP
650.0000 mg | Freq: Four times a day (QID) | RECTAL | Status: DC | PRN
  Administered 2021-08-13: 650 mg via RECTAL
  Filled 2021-08-12: qty 2

## 2021-08-12 MED ORDER — HALOPERIDOL 0.5 MG PO TABS
0.5000 mg | ORAL_TABLET | ORAL | Status: DC | PRN
Start: 2021-08-12 — End: 2021-08-15
  Filled 2021-08-12: qty 1

## 2021-08-12 MED ORDER — POLYVINYL ALCOHOL 1.4 % OP SOLN: 1.0000 [drp] | Freq: Four times a day (QID) | OPHTHALMIC | Status: DC | PRN

## 2021-08-12 NOTE — Assessment & Plan Note (Addendum)
Patient with multiple chronic medical problems presenting to the ER for evaluation of respiratory failure from COVID-19 19 pneumonia. ?He is currently on comfort measures ?Continue morphine gtt and additional dose vs titrate up as needed for respiratory distress ?Glycopyrrolate/atropine as needed for secretions ?Haldol as needed for agitation or delirium ?Family is at bedside ?

## 2021-08-12 NOTE — ED Provider Notes (Signed)
? ?[facility] ?Provider Note ? ? ? Event Date/Time  ? First MD Initiated Contact with Patient 07/30/2021 1057   ?  (approximate) ? ? ?History  ? ?Respiratory Distress ? ? ?HPI ? ?Duane Hill. is a 81 y.o. male with extensive past medical history including CAD, thrombocytopenia, renal insufficiency, and diabetes who presents with worsening shortness of breath and weakness over the last several days.  Per EMS, the patient was recently diagnosed with COVID and pneumonia, and he also has a UTI.  He has had increased difficulty breathing and decreased responsiveness.  The patient is DNR.  Per EMS, a family member initially felt that he should stay at the nursing facility, however eventually the decision was made between family and the nursing facility that the patient should be transferred to the ED for further care.  The patient himself is unable to give any history ? ? ?Physical Exam  ? ?Triage Vital Signs: ?ED Triage Vitals  ?Enc Vitals Group  ?   BP 07/25/2021 1054 (!) 149/62  ?   Pulse Rate 08/15/2021 1054 71  ?   Resp 07/30/2021 1054 (!) 8  ?   Temp 07/18/2021 1054 (!) 97 ?F (36.1 ?C)  ?   Temp Source 07/25/2021 1054 Axillary  ?   SpO2 07/24/2021 1050 97 %  ?   Weight 07/28/2021 1053 141 lb 12.1 oz (64.3 kg)  ?   Height 08/04/2021 1053 '5\' 8"'$  (1.727 m)  ?   Head Circumference --   ?   Peak Flow --   ?   Pain Score --   ?   Pain Loc --   ?   Pain Edu? --   ?   Excl. in [place]? --   ? ? ?Most recent vital signs: ?Vitals:  ? 07/31/2021 1411 08/15/2021 1449  ?BP: (!) 139/58 (!) 128/59  ?Pulse: 71 66  ?Resp: (!) 21 (!) 25  ?Temp:    ?SpO2: 97% 100%  ? ? ? ?General: Awake but minimally responsive, ill-appearing. ?CV:  Good peripheral perfusion.  Normal heart sounds. ?Resp:  Shallow, somewhat agonal respirations.  Rales bilaterally. ?Abd:  Soft and nontender.  No distention.  ?Other:  Extremities pale but warm.  Unable to assess neuro exam. ? ? ?ED Results / Procedures / Treatments  ? ?Labs ?(all labs ordered are  listed, but only abnormal results are displayed) ?Labs Reviewed - No data to display ? ? ?EKG ? ? ? ? ?RADIOLOGY ? ?Chest x-ray: I independently viewed and interpreted the images; there are new bilateral central opacities ? ? ?PROCEDURES: ? ?Critical Care performed: No ? ?Procedures ? ? ?MEDICATIONS ORDERED IN ED: ?Medications  ?morphine (PF) 2 MG/ML injection 1 mg (has no administration in time range)  ?LORazepam (ATIVAN) tablet 1 mg (has no administration in time range)  ?  Or  ?LORazepam (ATIVAN) 2 MG/ML concentrated solution 1 mg (has no administration in time range)  ?  Or  ?LORazepam (ATIVAN) injection 1 mg (has no administration in time range)  ?ondansetron (ZOFRAN-ODT) disintegrating tablet 4 mg (has no administration in time range)  ?  Or  ?ondansetron (ZOFRAN) injection 4 mg (has no administration in time range)  ?atropine 1 % ophthalmic solution 4 drop (has no administration in time range)  ?antiseptic oral rinse (BIOTENE) solution 15 mL (has no administration in time range)  ? ? ? ?IMPRESSION / MDM / ASSESSMENT AND PLAN / ED COURSE  ?I reviewed the triage vital signs and  the nursing notes. ? ?81 year old male with PMH as noted above presents with worsening shortness of breath and weakness in the context of COVID, pneumonia, and UTI.  I reviewed the past medical records including hospitalist discharge summary from 4/7.  The patient was admitted at that time due to slurred speech and penile bleeding.  He required transfusion of PRBCs, had bladder irrigation, and was treated for hyperkalemia and acute renal failure. ? ?On exam currently, the patient has normal vital signs except for hypothermia and a respiratory rate of 8.  O2 saturation is in the high 90s on 15 L by nonrebreather.  His respiratory effort is shallow and appears agonal.  The patient appears somewhat awake but is not responding, even to sternal rub. ? ?Overall presentation is consistent with symptoms related to ongoing multiple infections and  resulting respiratory failure.  Based on my initial evaluation, I feel that the patient is actively dying.  Given that the patient is DNR and per EMS there was a discussion of potentially keeping him at the nursing facility for comfort care rather than transfer to the ED, I contacted the patient's daughter to discuss goals of care before proceeding with any intervention. ? ?I spoke over the phone with [name], the patient's daughter.  She states that the patient does not have an official POA but she is his eldest daughter and has been handling his medical decision making.  She advises that the patient was also very ill during his ICU stay and the DNR decision was made at that time.  She understands that his clinical situation has deteriorated over the last week.  I discussed all options with her including intubation, work-up with x-rays, labs, and possible antibiotics and fluids, versus comfort care.  She confirms that the patient would not want to be intubated.  She agrees with comfort care at this time, with no acute interventions, and states that she will be here within the hour and we can discuss goals of care further at that point. ? ?The patient is on the cardiac monitor to evaluate for evidence of arrhythmia and/or significant heart rate changes. ? ?----------------------------------------- ?1:11 PM on 07/25/2021 ?----------------------------------------- ? ?The patient's daughters and son-in-law are all here.  They wanted to proceed with a chest x-ray, which does show bilateral opacities consistent with COVID.  However that they are all in agreement to forego further intervention and proceed with comfort care at this time.  They do not want to obtain lab work-up or attempt to give any other interventions.  We will observe the patient in the ED for the next few hours.  If he remains relatively stable we will admit for continued comfort care. ? ?----------------------------------------- ?4:17 PM on  07/23/2021 ?----------------------------------------- ? ?The patient's overall clinical status has not significantly changed.  Breathing appears slightly more shallow.  I consulted Dr. Francine Graven from the hospitalist service; based on her discussion she agrees to admit the patient. ? ? ? ?FINAL CLINICAL IMPRESSION(S) / ED DIAGNOSES  ? ?Final diagnoses:  ?Acute on chronic respiratory failure, unspecified whether with hypoxia or hypercapnia ([place])  ? ? ? ?Rx / DC Orders  ? ?ED Discharge Orders   ? ? None  ? ?  ? ? ? ?Note:  This document was prepared using Dragon voice recognition software and may include unintentional dictation errors.  ?  Arta Silence, MD ?08/02/2021 1619 ? ?

## 2021-08-12 NOTE — Telephone Encounter (Signed)
?  Care Management  ? ?Follow Up Note ? ? ?07/18/2021 ?Name: Duane Hill. MRN: [id number] DOB: 1940-05-06 ? ? ?Referred by: Jon Billings, NP ?Reason for referral : Chronic Care Management (RNCM: Follow up for Chronic Disease Management and Care Coordination Needs ) ? ? ?An unsuccessful telephone outreach was attempted today. The patient was referred to the case management team for assistance with care management and care coordination. Patient likely still at [facility] rehabilitation.  ? ?Follow Up Plan: A HIPPA compliant phone message was left for the patient providing contact information and requesting a return call.  ? ?Noreene Larsson RN, MSN, CCM ?Community Care Coordinator ?[facility] ?[place] ?Mobile: [phone]  ?

## 2021-08-12 NOTE — Chronic Care Management (AMB) (Signed)
?Chronic Care Management  ? ?CCM RN Visit Note ? ?08/13/2021 ?Name: Duane Hill. MRN: [id number] DOB: 1940/10/05 ? ?Subjective: ?Duane Meyn. is a 81 y.o. year old male who is a primary care patient of Jon Billings, NP. The care management team was consulted for assistance with disease management and care coordination needs.   ? ?Spoke with the patients daughter, Duane Hill  for follow up visit in response to provider referral for case management and/or care coordination services.  ? ?Consent to Services:  ?The patient was given information about Chronic Care Management services, agreed to services, and gave verbal consent prior to initiation of services.  Please see initial visit note for detailed documentation.  ? ?Patient agreed to services and verbal consent obtained.  ? ?Assessment: Review of patient past medical history, allergies, medications, health status, including review of consultants reports, laboratory and other test data, was performed as part of comprehensive evaluation and provision of chronic care management services.  ? ?SDOH (Social Determinants of Health) assessments and interventions performed:   ? ?CCM Care Plan ? ?Allergies  ?Allergen Reactions  ? Protonix [Pantoprazole Sodium] Nausea And Vomiting  ? ? ?Facility-Administered Encounter Medications as of 08/11/2021  ?Medication  ? acetaminophen (TYLENOL) tablet 650 mg  ? Or  ? acetaminophen (TYLENOL) suppository 650 mg  ? antiseptic oral rinse (BIOTENE) solution 15 mL  ? atropine 1 % ophthalmic solution 4 drop  ? glycopyrrolate (ROBINUL) tablet 1 mg  ? Or  ? glycopyrrolate (ROBINUL) injection 0.2 mg  ? Or  ? glycopyrrolate (ROBINUL) injection 0.2 mg  ? haloperidol (HALDOL) tablet 0.5 mg  ? Or  ? haloperidol (HALDOL) 2 MG/ML solution 0.5 mg  ? Or  ? haloperidol lactate (HALDOL) injection 0.5 mg  ? LORazepam (ATIVAN) tablet 1 mg  ? Or  ? LORazepam (ATIVAN) 2 MG/ML concentrated solution 1 mg  ? Or  ? LORazepam (ATIVAN) injection  1 mg  ? morphine (PF) 2 MG/ML injection 1 mg  ? ondansetron (ZOFRAN-ODT) disintegrating tablet 4 mg  ? Or  ? ondansetron (ZOFRAN) injection 4 mg  ? polyvinyl alcohol (LIQUIFILM TEARS) 1.4 % ophthalmic solution 1 drop  ? ?Outpatient Encounter Medications as of 07/26/2021  ?Medication Sig  ? acetaminophen (TYLENOL) 500 MG tablet Take 1,000 mg by mouth every 6 (six) hours as needed. Patient uss Caplet extra strength  ? albuterol (VENTOLIN HFA) 108 (90 Base) MCG/ACT inhaler Take 2 puffs every 4-6 hours as necessary for shortness of breath  ? atorvastatin (LIPITOR) 20 MG tablet Take 1 tablet (20 mg total) by mouth daily.  ? Azelastine HCl 0.15 % SOLN Place 2 sprays into both nostrils 2 (two) times daily. Place 2 sprays into both nostrils 2 (two) times daily.  ? CALMOSEPTINE 0.44-20.6 % OINT Apply topically.  ? carvedilol (COREG) 12.5 MG tablet Take 1 tablet (12.5 mg total) by mouth 2 (two) times daily with a meal.  ? Choline Fenofibrate (FENOFIBRIC ACID) 135 MG CPDR Take 1 capsule by mouth daily.  ? dicyclomine (BENTYL) 10 MG capsule Take 1 capsule (10 mg total) by mouth 3 (three) times daily before meals.  ? fluticasone (FLONASE) 50 MCG/ACT nasal spray Place 1 spray into both nostrils 2 (two) times daily.  ? gabapentin (NEURONTIN) 300 MG capsule Take 2 capsules (600 mg total) by mouth 2 (two) times daily.  ? glipiZIDE (GLUCOTROL) 5 MG tablet Take 5 mg by mouth 2 (two) times daily before a meal.  ? glucose blood test strip CHECK  BLOOD SUGAR TWICE DAILY.  ? heparin flush 10 UNIT/ML SOLN injection Inject 10 mLs into the vein daily.  ? levofloxacin (LEVAQUIN) 500 MG/100ML SOLN Inject 500 mg into the vein daily.  ? metFORMIN (GLUCOPHAGE-XR) 500 MG 24 hr tablet Take 1 tablet (500 mg total) by mouth in the morning and at bedtime.  ? Microlet Lancets MISC USE TWICE DAILY AS DIRECTED.  ? Multiple Vitamin (MULTI-DAY PO) Take 1 tablet by mouth daily.  ? omeprazole (PRILOSEC) 40 MG capsule Take 1 capsule (40 mg total) by mouth  daily.  ? potassium chloride SA (KLOR-CON M) 20 MEQ tablet Take 1 tablet (20 mEq total) by mouth daily.  ? traMADol (ULTRAM) 50 MG tablet Take 50 mg by mouth every 6 (six) hours as needed.  ? vitamin B-12 (CYANOCOBALAMIN) 1000 MCG tablet Take 1 tablet (1,000 mcg total) by mouth daily.  ? ? ?Patient Active Problem List  ? Diagnosis Date Noted  ? Comfort measures only status 07/25/2021  ? Pneumonia due to COVID-19 virus 07/19/2021  ? Hypokalemia 07/23/2021  ? Generalized weakness 07/22/2021  ? Rhabdomyolysis 07/21/2021  ? Sepsis ([place]) 07/21/2021  ? Transaminitis 07/21/2021  ? Thrombocytopenia ([place]) 07/21/2021  ? Pancytopenia ([place]) 07/21/2021  ? Pressure injury of skin 07/20/2021  ? Symptomatic anemia secondary to penile bleed 07/19/2021  ? Bleeding of penis likely d/t hx radiation cystitis (hx prostate CA), possible prostate CA recurrence  07/19/2021  ? Dysarthria 07/19/2021  ? TIA (transient ischemic attack) 07/19/2021  ? Acute renal failure ([place]) 07/19/2021  ? NSTEMI (non-ST elevated myocardial infarction) ([place]) 07/19/2021  ? Acute respiratory failure ([place])   ? B12 deficiency 04/29/2020  ? HSV (herpes simplex virus) infection 05/10/2019  ? Allergic rhinitis 02/14/2019  ? Advanced care planning/counseling discussion 11/29/2016  ? GERD (gastroesophageal reflux disease) 11/05/2014  ? Hyperlipidemia associated with type 2 diabetes mellitus ([place]) 11/05/2014  ? Chronic radiation cystitis 11/05/2014  ? Hypertension associated with diabetes ([place]) 02/02/2013  ? Type 2 diabetes mellitus with hyperglycemia ([place]) 02/02/2013  ? History of prostate cancer 02/02/2013  ? ? ?Conditions to be addressed/monitored:HTN, HLD, DMII, and Patient is DNR, in the hospital on comfort measures at this time ? ?Care Plan : RNCM: General Plan of Care (Adult) for Chronic Disease Management and Care Coordination Needs  ?Updates made by Vanita Ingles, RN since 07/29/2021 12:00 AM  ?  ? ?Problem: RNCM: Development of Plan of Care for Chronic Disease  Management (DM, HTM, HLD, Chronic pain, Falls and safety)   ?Priority: High  ?  ? ?Long-Range Goal: RNCM: Effective Management  of Plan of Care for Chronic Disease Management (DM, HTM, HLD, Chronic pain, Falls and safety)   ?Start Date: 03/03/2021  ?Expected End Date: 03/03/2022  ?Priority: High  ?Note:   ?Current Barriers:  ?Knowledge Deficits related to plan of care for management of HTN, HLD, DMII, Falls and Safety Concerns, and Chronic pain  ?Care Coordination needs related to Limited social support, Transportation, Level of care concerns, ADL IADL limitations, Social Isolation, and Inability to perform IADL's independently  ?Chronic Disease Management support and education needs related to HTN, HLD, DMII, Falls and safety concerns, and Chronic pain  ?Lacks caregiver support.        ?Film/video editor.  ?Very High Fall Risk  ?Unreliable transportation ?Changes in vision and needs cataracts removed  ? ?RNCM Clinical Goal(s):  ?Patient will verbalize understanding of plan for management of HTN, HLD, DMII, and chronic pain and falls as evidenced by compliant with plan of  care, working with the CCM team to optimize health and well being ?take all medications exactly as prescribed and will call provider for medication related questions as evidenced by taking medications as directed and calling for refills before running out of medications     ?attend all scheduled medical appointments: The patient does not have any upcoming appointments with the pcp, knows to call for new needs or concerns as evidenced by keeping appointments         ?demonstrate improved and ongoing adherence to prescribed treatment plan for HTN, HLD, DMII, and Chronic pain, falls, and safety as evidenced by reporting new falls, following the plan of care and working with the CCM team to effectively manage health and well being  ?demonstrate improved and ongoing health management independence as evidenced by working with the CCM team to optimize  health and well being        ?demonstrate a decrease in HTN, HLD, DMII, and Chronic pains and falls and safety exacerbations  as evidenced by taking medications as prescribed, adherence with medications an

## 2021-08-12 NOTE — Patient Instructions (Signed)
Visit Information ? ?Thank you for taking time to visit with me today. Please don't hesitate to contact me if I can be of assistance to you before our next scheduled telephone appointment. ? ?07/22/2021: A call was made to the patients daughter after not being able to reach the patient and receiving notification that the patient was in the ED at [facility]. Jody stated that they were waiting for a bed in the hospital and were waiting with the patient in the ED. The patient has COVID pneumonia and is not expected to recover. The patient is a DNR and the family has agreed to comfort measures. The daughter states that the family is with him. He had left to go to the nursing home for rehabilitation when he became sick and having shortness of breath. Per the daughter the "hospitalist" do no feel the patient will recover.  Offered sincere condolences and the patients daughter took down the number for the [facility] to notify of any changes. Will continue to monitor. Will notify the pcp and LCSW or current events.  ? ? ? ? ? ? ? ?Patient verbalizes understanding of instructions and care plan provided today and agrees to view in [facility]. Active MyChart status confirmed with patient.   ? ?Noreene Larsson RN, MSN, CCM ?Community Care Coordinator ?[facility] ?[place] ?Mobile: [phone]  ?

## 2021-08-12 NOTE — Assessment & Plan Note (Signed)
Patient is unvaccinated and tested positive for the COVID-19 virus about a week ago. ?Imaging is suggestive of bilateral pneumonia ?Patient is on comfort measures and so will not receive any antiviral agents or systemic steroids. ?

## 2021-08-12 NOTE — H&P (Signed)
?History and Physical  ? ? ?Patient: Duane Hill. KWI:[id number] DOB: 1940-06-21 ?DOA: 07/31/2021 ?DOS: the patient was seen and examined on 08/02/2021 ?PCP: Jon Billings, NP  ?Patient coming from: Home ? ?Chief Complaint:  ?Chief Complaint  ?Patient presents with  ? Respiratory Distress  ? ?Most of the history was obtained from the EMR as well as his daughters at the bedside ?HPI: Duane Hill. is a 81 y.o. male with medical history significant for radiation cystitis, pancytopenia, diabetes mellitus, coronary artery disease, hypertension who was recently discharged from the hospital after an admission for symptomatic anemia secondary to penile bleed and acute kidney injury.  He was discharged to a skilled nursing facility where according to his daughter as he had continued to decline and tested positive for the COVID-19 virus about a week ago.  ?He was sent to the ER from the nursing home because of progressive decline. ?I am unable to do review of systems on this patient ?Review of Systems: unable to review all systems due to the inability of the patient to answer questions. ?Past Medical History:  ?Diagnosis Date  ? Acute respiratory failure with hypoxia ([place])   ? Arthritis   ? Asthma   ? Inhaler as needed  ? Dupuytren contracture   ? GERD (gastroesophageal reflux disease)   ? Hyperkalemia 07/19/2021  ? Hyperlipidemia   ? Hypertension   ? Hypophosphatemia 07/23/2021  ? Hypoxia, improved 07/19/2021  ? Radiation proctitis   ? Renal insufficiency   ? ?Past Surgical History:  ?Procedure Laterality Date  ? PROSTATE SURGERY    ? ?Social History:  reports that he quit smoking about 34 years ago. His smoking use included cigarettes. He has never used smokeless tobacco. He reports current alcohol use of about 7.0 standard drinks per week. He reports that he does not use drugs. ? ?Allergies  ?Allergen Reactions  ? Protonix [Pantoprazole Sodium] Nausea And Vomiting  ? ? ?Family History  ?Problem Relation Age of  Onset  ? Heart attack Mother   ? Hypertension Father   ? Stroke Father   ? Cancer Father   ? Diabetes Father   ? Heart attack Father   ? ? ?Prior to Admission medications   ?Medication Sig Start Date End Date Taking? Authorizing Provider  ?acetaminophen (TYLENOL) 500 MG tablet Take 1,000 mg by mouth every 6 (six) hours as needed. Patient uss Caplet extra strength   Yes [provider]  ?atorvastatin (LIPITOR) 20 MG tablet Take 1 tablet (20 mg total) by mouth daily. 06/23/21  Yes Jon Billings, NP  ?Azelastine HCl 0.15 % SOLN Place 2 sprays into both nostrils 2 (two) times daily. Place 2 sprays into both nostrils 2 (two) times daily. 02/16/21  Yes Jon Billings, NP  ?CALMOSEPTINE 0.44-20.6 % OINT Apply topically. 07/27/21  Yes [provider]  ?carvedilol (COREG) 12.5 MG tablet Take 1 tablet (12.5 mg total) by mouth 2 (two) times daily with a meal. 07/24/21  Yes Ezekiel Slocumb, DO  ?Choline Fenofibrate (FENOFIBRIC ACID) 135 MG CPDR Take 1 capsule by mouth daily. 01/28/21  Yes Jon Billings, NP  ?dicyclomine (BENTYL) 10 MG capsule Take 1 capsule (10 mg total) by mouth 3 (three) times daily before meals. 07/24/21  Yes Nicole Kindred A, DO  ?fluticasone (FLONASE) 50 MCG/ACT nasal spray Place 1 spray into both nostrils 2 (two) times daily. 01/28/21  Yes Jon Billings, NP  ?gabapentin (NEURONTIN) 300 MG capsule Take 2 capsules (600 mg total) by mouth 2 (  two) times daily. 06/17/21  Yes Jon Billings, NP  ?glipiZIDE (GLUCOTROL) 5 MG tablet Take 5 mg by mouth 2 (two) times daily before a meal.   Yes [provider]  ?heparin flush 10 UNIT/ML SOLN injection Inject 10 mLs into the vein daily. 08/10/21  Yes [provider]  ?levofloxacin (LEVAQUIN) 500 MG/100ML SOLN Inject 500 mg into the vein daily. 08/10/21  Yes [provider]  ?metFORMIN (GLUCOPHAGE-XR) 500 MG 24 hr tablet Take 1 tablet (500 mg total) by mouth in the morning and at bedtime. 07/16/21  Yes Jon Billings, NP  ?Multiple Vitamin (MULTI-DAY PO) Take 1 tablet by mouth daily.   Yes [provider]  ?omeprazole (PRILOSEC) 40 MG capsule Take 1 capsule (40 mg total) by mouth daily. 07/16/21  Yes Jon Billings, NP  ?vitamin B-12 (CYANOCOBALAMIN) 1000 MCG tablet Take 1 tablet (1,000 mcg total) by mouth daily. 07/02/21  Yes Jon Billings, NP  ?albuterol (VENTOLIN HFA) 108 (90 Base) MCG/ACT inhaler Take 2 puffs every 4-6 hours as necessary for shortness of breath 11/24/20   Jon Billings, NP  ?glucose blood test strip CHECK BLOOD SUGAR TWICE DAILY. 12/26/19   Venita Lick, NP  ?Microlet Lancets MISC USE TWICE DAILY AS DIRECTED. 08/01/20   Cannady, Henrine Screws T, NP  ?potassium chloride SA (KLOR-CON M) 20 MEQ tablet Take 1 tablet (20 mEq total) by mouth daily. 07/24/21   Ezekiel Slocumb, DO  ?traMADol (ULTRAM) 50 MG tablet Take 50 mg by mouth every 6 (six) hours as needed. 08/04/21   [provider]  ? ? ?Physical Exam: ?Vitals:  ? 07/23/2021 1338 08/08/2021 1411 08/02/2021 1449 08/03/2021 1630  ?BP: 135/61 (!) 139/58 (!) 128/59   ?Pulse: 70 71 66 67  ?Resp: 20 (!) 21 (!) 25 (!) 33  ?Temp:      ?TempSrc:      ?SpO2: 96% 97% 100% 93%  ?Weight:      ?Height:      ? ?Physical Exam ?Vitals and nursing note reviewed.  ?Constitutional:   ?   Comments: Lethargic.  Arousable.  Agonal breathing.  Chronically ill-appearing  ?HENT:  ?   Head: Normocephalic and atraumatic.  ?   Nose:  ?   Comments: Nonrebreather mask in place ?   Mouth/Throat:  ?   Mouth: Mucous membranes are dry.  ?Cardiovascular:  ?   Rate and Rhythm: Normal rate and regular rhythm.  ?Pulmonary:  ?   Comments: Tachypnea.  Rapid shallow breathing ?Abdominal:  ?   General: Abdomen is flat.  ?   Palpations: Abdomen is soft.  ?Musculoskeletal:  ?   Cervical back: Normal range of motion and neck supple.  ?Skin: ?   General: Skin is warm and dry.  ?Neurological:  ?   Comments: Unable to assess  ?Psychiatric:  ?   Comments: Unable to assess  ? ? ?Data  Reviewed: ?Relevant notes from primary care and specialist visits, past discharge summaries as available in EHR, including Care Everywhere. ?Prior diagnostic testing as pertinent to current admission diagnoses ?Updated medications and problem lists for reconciliation ?ED course, including vitals, labs, imaging, treatment and response to treatment ?Triage notes, nursing and pharmacy notes and ED provider's notes ?Notable results as noted in HPI ?Chest x-ray showed new bilateral central heterogeneous opacities, possibly due to ?pulmonary edema or infection. ?There are no new results to review at this time. ? ?Assessment and Plan: ?* Comfort measures only status ?Patient with multiple chronic medical problems presenting to the  ER for evaluation of respiratory failure from COVID-19 19 pneumonia. ?He is currently on comfort measures ?Continue morphine as needed for respiratory distress ?Glycopyrrolate/atropine as needed for secretions ?Haldol as needed for agitation or delirium ?Family is at bedside ? ?Pneumonia due to COVID-19 virus ?Patient is unvaccinated and tested positive for the COVID-19 virus about a week ago. ?Imaging is suggestive of bilateral pneumonia ?Patient is on comfort measures and so will not receive any antiviral agents or systemic steroids. ? ?Acute respiratory failure ([place]) ?Secondary to COVID 19 pneumonia ? ? ? ? ? Advance Care Planning:   Code Status: DNR  ? ?Consults: None ? ?Family Communication: Greater than 50% of time was spent discussing patient's condition and plan of care with his daughters at the bedside.  All questions and concerns have been addressed.  They verbalized understanding and agree with the plan ? ?Severity of Illness: ?The appropriate patient status for this patient is INPATIENT. Inpatient status is judged to be reasonable and necessary in order to provide the required intensity of service to ensure the patient's safety. The patient's presenting symptoms, physical exam  findings, and initial radiographic and laboratory data in the context of their chronic comorbidities is felt to place them at high risk for further clinical deterioration. Furthermore, it is not anticipated that the pa

## 2021-08-12 NOTE — Assessment & Plan Note (Signed)
Secondary to COVID 19 pneumonia ?

## 2021-08-12 NOTE — ED Notes (Signed)
Family at bedside. 

## 2021-08-12 NOTE — ED Triage Notes (Signed)
Patient to ED via [facility] from [facility] for resp distress. Patient is actively dying per facility and was sent per staff. Patient currently positive for covid, pneumonia and UTI. Patient unresponsive at this time. DNR present.  ?

## 2021-08-13 DIAGNOSIS — Z515 Encounter for palliative care: Secondary | ICD-10-CM | POA: Diagnosis not present

## 2021-08-13 MED ORDER — MORPHINE SULFATE (PF) 4 MG/ML IV SOLN
INTRAVENOUS | Status: AC
  Filled 2021-08-13: qty 1

## 2021-08-13 MED ORDER — MORPHINE SULFATE (PF) 4 MG/ML IV SOLN
4.0000 mg | Freq: Once | INTRAVENOUS | Status: AC
  Administered 2021-08-13: 4 mg via INTRAVENOUS

## 2021-08-13 MED ORDER — MORPHINE SULFATE (PF) 4 MG/ML IV SOLN: 4.0000 mg | INTRAVENOUS | Status: DC | PRN

## 2021-08-13 MED ORDER — MORPHINE 100MG IN NS 100ML (1MG/ML) PREMIX INFUSION
4.0000 mg/h | INTRAVENOUS | Status: DC
Start: 2021-08-13 — End: 2021-08-15
  Administered 2021-08-13: 4 mg/h via INTRAVENOUS
  Administered 2021-08-13: 2 mg/h via INTRAVENOUS
  Filled 2021-08-13 (×2): qty 100

## 2021-08-13 NOTE — Progress Notes (Signed)
?  Progress Note ? ? ?Patient: Duane Hill. MEB:[id number] DOB: 1940-04-22 DOA: 08/04/2021     1 ?DOS: the patient was seen and examined on 08/13/2021 ?  ?Brief hospital course: ? ?Duane Hill. is a 81 y.o. male with medical history significant for radiation cystitis, pancytopenia, diabetes mellitus, coronary artery disease, hypertension who was recently discharged from the hospital after an admission for symptomatic anemia secondary to penile bleed and acute kidney injury.  He was discharged to a skilled nursing facility where according to his daughter as he had continued to decline and tested positive for the COVID-19 virus about a week ago. He was sent to the ER from the nursing home because of progressive decline and respiratory distress. Hypothermic in ED, RR8, UTI, PNA. Pt DNR. ED physician spoke w/ family and the decision was made to admit for comfort care.  ? ?Assessment and Plan: ? ?* Comfort measures only status ?Patient with multiple chronic medical problems presenting to the ER for evaluation of respiratory failure from COVID-19 19 pneumonia. ?He is currently on comfort measures ?Continue morphine gtt and additional dose vs titrate up as needed for respiratory distress ?Glycopyrrolate/atropine as needed for secretions ?Haldol as needed for agitation or delirium ?Family is at bedside ? ?Pneumonia due to COVID-19 virus ?Patient is unvaccinated and tested positive for the COVID-19 virus about a week ago. ?Imaging is suggestive of bilateral pneumonia ?Patient is on comfort measures and so will not receive any antiviral agents or systemic steroids. ? ?Acute respiratory failure ([place]) ?Secondary to COVID 19 pneumonia ? ? ? ? ?  ? ?Subjective: pt unable to provide history  ? ?Physical Exam: ?Vitals:  ? 08/13/21 1015 08/13/21 1030 08/13/21 1045 08/13/21 1100  ?BP:      ?Pulse: (!) 111 (!) 111 (!) 111 (!) 115  ?Resp: '15 16 15 13  '$ ?Temp:      ?TempSrc:      ?SpO2: 94% 94% 94% (!) 84%  ?Weight:       ?Height:      ?Constitutional:  ?VS, see nurse notes ?General Appearance: somnolent, unresponsive, open mouth breathing, mild resp distress ?Eyes: ?Normal lids and conjunctive ?Ears, Nose, Mouth, Throat: ?Dry MM ?Respiratory: ?Increased respiratory effort ?Neurological: ?Somnolent  ? ? ?Data Reviewed: ?There are no new results to review at this time. ? ?Family Communication: daughter Jeral Fruit and Sharee Pimple and their husbands at bedside  ? ?Disposition: ?Status is: Inpatient ?Remains inpatient appropriate because: expect imminent expiration ? Planned Discharge Destination:  expect imminet expiration  ? ? ? ?Time spent: 45 minutes ? ?Author: ?Emeterio Reeve, DO ?08/13/2021 2:55 PM ? ?For on call review [email or web address].  ?

## 2021-08-13 NOTE — Progress Notes (Signed)
Admission profile updated. ?

## 2021-08-13 NOTE — Progress Notes (Signed)
Cross Cover ?Morphine continuous infusion initiated overnight to aid in the relief of dyspnea. Encouraged nursing utilization of robinul and atropine for secretion control ?

## 2021-08-13 NOTE — Progress Notes (Signed)
Nutrition Brief Note  Chart reviewed. Pt now transitioning to comfort care.  No further nutrition interventions planned at this time.  Please re-consult as needed.   Morgaine Kimball W, RD, LDN, CDCES Registered Dietitian II Certified Diabetes Care and Education Specialist Please refer to [facility] for RD and/or RD on-call/weekend/after hours pager   

## 2021-08-13 NOTE — ED Notes (Signed)
This RN to bedside, additional family members at bedside. This RN updated family on POC, provided family with toothbrushes and snacks. Discussed with family discontinuance of pt's O2 [place]. Family agreed to d/c [place]. This RN notified family of what to expect as pt. Declines. Family will notify this RN if pt. Seems to be in pain/distress or struggling. This RN updated Dr. Sheppard Coil of happening and discussions with family. ?

## 2021-08-13 NOTE — Progress Notes (Signed)
?   08/13/21 1152  ?Clinical Encounter Type  ?Visited With Patient and family together  ?Visit Type Initial  ?Referral From Nurse  ? ?Chaplain followed up on [facility] to provide support for family of loved one placed on comfort care. ?

## 2021-08-13 NOTE — ED Notes (Signed)
NP Randol Kern notified family wants to speak with provider aboput expectations for this Pt.  Pt's son in law and daughters at bedside reports ED doctor/ admitting physician never came to see them to discuss the patient's condition ?

## 2021-08-13 NOTE — ED Notes (Signed)
This RN contacted Dr. Sheppard Coil about possibly having palliative care consult for pt. And family benefit. Dr. Sheppard Coil states she will come see the pt. And talk to family shortly. Will keep family updated on POC. ?

## 2021-08-13 NOTE — ED Notes (Signed)
NP Randol Kern at bedside  ?

## 2021-08-13 NOTE — Progress Notes (Signed)
Patient's daughter Duane Hill stated the funeral home name is [employer] in [place], [place]. All has been arranged per daughter. Duane Hill would like to be called first.  ?

## 2021-08-13 NOTE — ED Notes (Signed)
This RN to bedside for bedside report and assessment. Pt. Is laying in bed, eyes closed, chest rise and fall. Pt. Has even, labored respirations with retractions. Pt's daughter's updated on POC, and will notify this RN if pt. Condition changes, or for any concerns/questions. Will continue to monitor. ?

## 2021-08-14 DIAGNOSIS — I152 Hypertension secondary to endocrine disorders: Secondary | ICD-10-CM | POA: Diagnosis not present

## 2021-08-14 DIAGNOSIS — E1165 Type 2 diabetes mellitus with hyperglycemia: Secondary | ICD-10-CM | POA: Diagnosis not present

## 2021-08-14 DIAGNOSIS — E785 Hyperlipidemia, unspecified: Secondary | ICD-10-CM | POA: Diagnosis not present

## 2021-08-14 DIAGNOSIS — E1159 Type 2 diabetes mellitus with other circulatory complications: Secondary | ICD-10-CM | POA: Diagnosis not present

## 2021-08-14 DIAGNOSIS — E1169 Type 2 diabetes mellitus with other specified complication: Secondary | ICD-10-CM

## 2021-08-14 DIAGNOSIS — Z515 Encounter for palliative care: Secondary | ICD-10-CM | POA: Diagnosis not present

## 2021-08-16 DIAGNOSIS — E1165 Type 2 diabetes mellitus with hyperglycemia: Secondary | ICD-10-CM

## 2021-08-16 DIAGNOSIS — E1169 Type 2 diabetes mellitus with other specified complication: Secondary | ICD-10-CM

## 2021-08-16 DIAGNOSIS — E1159 Type 2 diabetes mellitus with other circulatory complications: Secondary | ICD-10-CM

## 2021-08-16 DIAGNOSIS — I152 Hypertension secondary to endocrine disorders: Secondary | ICD-10-CM

## 2021-08-16 DIAGNOSIS — E785 Hyperlipidemia, unspecified: Secondary | ICD-10-CM

## 2021-08-17 NOTE — Progress Notes (Signed)
?  Progress Note ? ? ?Patient: Duane Hill. OIB:[id number] DOB: 1940-09-29 DOA: 07/28/2021     2 ?DOS: the patient was seen and examined on 09-05-21 ?  ?Brief hospital course: ? ?Duane Hill. is a 81 y.o. male with medical history significant for radiation cystitis, pancytopenia, diabetes mellitus, coronary artery disease, hypertension who was recently discharged from the hospital after an admission for symptomatic anemia secondary to penile bleed and acute kidney injury.  He was discharged to a skilled nursing facility where according to his daughter as he had continued to decline and tested positive for the COVID-19 virus about a week ago. He was sent to the ER from the nursing home because of progressive decline and respiratory distress. Hypothermic in ED, RR8, UTI, PNA. Pt DNR. ED physician spoke w/ family and the decision was made to admit for comfort care.  ? ?Assessment and Plan: ? ?* Comfort measures only status ?Patient with multiple chronic medical problems presenting to the ER for evaluation of respiratory failure from COVID-19 19 pneumonia. ?He is currently on comfort measures ?Continue morphine gtt and additional dose vs titrate up as needed for respiratory distress ?Glycopyrrolate/atropine as needed for secretions ?Haldol as needed for agitation or delirium ?Family is at bedside ? ?Pneumonia due to COVID-19 virus ?Patient is unvaccinated and tested positive for the COVID-19 virus about a week ago. ?Imaging is suggestive of bilateral pneumonia ?Patient is on comfort measures and so will not receive any antiviral agents or systemic steroids. ? ?Acute respiratory failure ([place]) ?Secondary to COVID 19 pneumonia ? ? ? ? ?  ? ?Subjective: pt unable to provide history  ? ?Physical Exam: ?Vitals:  ? 08/13/21 1015 08/13/21 1030 08/13/21 1045 08/13/21 1100  ?BP:      ?Pulse: (!) 111 (!) 111 (!) 111 (!) 115  ?Resp: '15 16 15 13  '$ ?Temp:      ?TempSrc:      ?SpO2: 94% 94% 94% (!) 84%  ?Weight:       ?Height:      ?Constitutional:  ?General Appearance: somnolent, unresponsive, open mouth agonal breathing, mild resp distress ?Ears, Nose, Mouth, Throat: ?Dry MM ?Respiratory: ?Increased respiratory effort ?Neurological: ?Eyes open but not responsive ? ? ? ?Data Reviewed: ?There are no new results to review at this time. ? ?Family Communication: daughter Duane Hill and Duane Hill and their husbands at bedside  ? ?Disposition: ?Status is: Inpatient ?Remains inpatient appropriate because: expect imminent expiration ? Planned Discharge Destination:  expect imminet expiration  ? ? ? ?Time spent: 15 minutes ? ?Author: ?Emeterio Reeve, DO ?09-05-2021 12:48 PM ? ?For on call review [email or web address]  ?

## 2021-08-17 NOTE — Progress Notes (Signed)
Upon rounding, patient took his last breath. Confirmed with charge nurse time of death @ 29. Stopped morphine drip. Spoke with Marzetta Board at [facility] who stated  that patient was not a candidate due to covid diagnosis. The [employer] reference number is [id number] ?[id number].  ?

## 2021-08-17 NOTE — Progress Notes (Signed)
Postmortem care completed. Morphine wasted with charge nurse. UC notified to contact transport to come get patient. ?

## 2021-08-17 NOTE — Care Management Important Message (Signed)
Important Message ? ?Patient Details  ?Name: Duane Hill. ?MRN: [id number] ?Date of Birth: November 14, 1940 ? ? ?Medicare Important Message Given:  N/A - LOS <3 / Initial given by admissions ? ? ? ? ?[name] ?09/04/21, 8:30 AM ?

## 2021-08-17 DEATH — deceased

## 2021-08-18 ENCOUNTER — Ambulatory Visit: Payer: Medicare Other | Admitting: Urology

## 2021-08-19 ENCOUNTER — Telehealth: Payer: Medicare Other

## 2021-09-01 ENCOUNTER — Telehealth: Payer: Medicare Other

## 2021-09-17 NOTE — Discharge Summary (Signed)
?Physician Discharge Summary ?  ?Patient: Duane Hill. MRN: [id number] DOB: 06-16-1940  ?Admit date:     07/25/2021  ?Discharge date: 2021-08-30  ?Discharge Physician: Emeterio Reeve  ? ?PCP: Jon Billings, NP  ? ?Recommendations at discharge:  ?none ? ?Discharge Diagnoses: ?Principal Problem: ?  Comfort measures only status ?Active Problems: ?  Pneumonia due to COVID-19 virus ?  Acute respiratory failure ([place]) ? ?Resolved Problems: ?  * No resolved hospital problems. * ? ?Hospital Course: ?No notes on file ? ?Assessment and Plan: ?* Comfort measures only status ?Patient with multiple chronic medical problems presenting to the ER for evaluation of respiratory failure from COVID-19 19 pneumonia. ?He is currently on comfort measures ?Continue morphine gtt and additional dose vs titrate up as needed for respiratory distress ?Glycopyrrolate/atropine as needed for secretions ?Haldol as needed for agitation or delirium ?Family is at bedside ? ?Pneumonia due to COVID-19 virus ?Patient is unvaccinated and tested positive for the COVID-19 virus about a week ago. ?Imaging is suggestive of bilateral pneumonia ?Patient is on comfort measures and so will not receive any antiviral agents or systemic steroids. ? ?Acute respiratory failure ([place]) ?Secondary to COVID 19 pneumonia ? ? ? ? ?  ? ? ?Consultants: none ?Procedures performed: none  ?Disposition:  deceased ?Diet recommendation:  ?N/a ?DISCHARGE MEDICATION: ? ? ?Discharge Exam: ?Filed Weights  ? 08/13/2021 1053 Aug 30, 2021 1957  ?Weight: 64.3 kg 67.8 kg  ?Not performed by myself, patient expired overnight  ? ?Condition at discharge:  deceased ? ?The results of significant diagnostics from this hospitalization (including imaging, microbiology, ancillary and laboratory) are listed below for reference.  ? ?Imaging Studies: ?DG Chest Portable 1 View ? ?Result Date: 08/16/2021 ?CLINICAL DATA:  Worsening shortness breath. EXAM: PORTABLE CHEST 1 VIEW COMPARISON:  Chest x-ray  dated July 20, 2021 FINDINGS: Cardiac and mediastinal contours are unchanged within normal limits. New bilateral central heterogeneous opacities. No large pleural effusion or pneumothorax. IMPRESSION: New bilateral central heterogeneous opacities, possibly due to pulmonary edema or infection. Electronically Signed   By: Yetta Glassman M.D.   On: 08/01/2021 12:18   ? ?Microbiology: ?Results for orders placed or performed during the hospital encounter of 07/19/21  ?Resp Panel by RT-PCR (Flu A&B, Covid) Nasopharyngeal Swab     Status: None  ? Collection Time: 07/19/21 10:54 AM  ? Specimen: Nasopharyngeal Swab; Nasopharyngeal(NP) swabs in vial transport medium  ?Result Value Ref Range Status  ? SARS Coronavirus 2 by RT PCR NEGATIVE NEGATIVE Final  ?  Comment: (NOTE) ?SARS-CoV-2 target nucleic acids are NOT DETECTED. ? ?The SARS-CoV-2 RNA is generally detectable in upper respiratory ?specimens during the acute phase of infection. The lowest ?concentration of SARS-CoV-2 viral copies this assay can detect is ?138 copies/mL. A negative result does not preclude SARS-Cov-2 ?infection and should not be used as the sole basis for treatment or ?other patient management decisions. A negative result may occur with  ?improper specimen collection/handling, submission of specimen other ?than nasopharyngeal swab, presence of viral mutation(s) within the ?areas targeted by this assay, and inadequate number of viral ?copies(<138 copies/mL). A negative result must be combined with ?clinical observations, patient history, and epidemiological ?information. The expected result is Negative. ? ?Fact Sheet for Patients:  ?EntrepreneurPulse.com.au ? ?Fact Sheet for Healthcare Providers:  ?IncredibleEmployment.be ? ?This test is no t yet approved or cleared by the [place] FDA and  ?has been authorized for detection and/or diagnosis of SARS-CoV-2 by ?FDA under an Emergency Use Authorization (EUA). This  EUA will remain  ?in effect (meaning this test can be used) for the duration of the ?COVID-19 declaration under Section 564(b)(1) of the Act, 21 ?U.S.C.section 360bbb-3(b)(1), unless the authorization is terminated  ?or revoked sooner.  ? ? ?  ? Influenza A by PCR NEGATIVE NEGATIVE Final  ? Influenza B by PCR NEGATIVE NEGATIVE Final  ?  Comment: (NOTE) ?The Xpert Xpress SARS-CoV-2/FLU/RSV plus assay is intended as an aid ?in the diagnosis of influenza from Nasopharyngeal swab specimens and ?should not be used as a sole basis for treatment. Nasal washings and ?aspirates are unacceptable for Xpert Xpress SARS-CoV-2/FLU/RSV ?testing. ? ?Fact Sheet for Patients: ?EntrepreneurPulse.com.au ? ?Fact Sheet for Healthcare Providers: ?IncredibleEmployment.be ? ?This test is not yet approved or cleared by the [place] FDA and ?has been authorized for detection and/or diagnosis of SARS-CoV-2 by ?FDA under an Emergency Use Authorization (EUA). This EUA will remain ?in effect (meaning this test can be used) for the duration of the ?COVID-19 declaration under Section 564(b)(1) of the Act, 21 U.S.C. ?section 360bbb-3(b)(1), unless the authorization is terminated or ?revoked. ? ?Performed at [facility], [place], ?[place] [address] ?  ?MRSA Next Gen by PCR, Nasal     Status: None  ? Collection Time: 07/19/21  6:26 PM  ? Specimen: Nasal Mucosa; Nasal Swab  ?Result Value Ref Range Status  ? MRSA by PCR Next Gen NOT DETECTED NOT DETECTED Final  ?  Comment: (NOTE) ?The GeneXpert MRSA Assay (FDA approved for NASAL specimens only), ?is one component of a comprehensive MRSA colonization surveillance ?program. It is not intended to diagnose MRSA infection nor to guide ?or monitor treatment for MRSA infections. ?Test performance is not FDA approved in patients less than 2 years ?old. ?Performed at [facility], [place], ?[place] [address] ?  ?CULTURE,  BLOOD (ROUTINE X 2) w Reflex to ID Panel     Status: None  ? Collection Time: 07/20/21  8:31 AM  ? Specimen: BLOOD  ?Result Value Ref Range Status  ? Specimen Description BLOOD BLOOD RIGHT HAND  Final  ? Special Requests   Final  ?  BOTTLES DRAWN AEROBIC AND ANAEROBIC Blood Culture adequate volume  ? Culture   Final  ?  NO GROWTH 5 DAYS ?Performed at [facility], [address] [place], [place] [address] ?  ? Report Status 07/25/2021 FINAL  Final  ?CULTURE, BLOOD (ROUTINE X 2) w Reflex to ID Panel     Status: None  ? Collection Time: 07/20/21  8:31 AM  ? Specimen: BLOOD  ?Result Value Ref Range Status  ? Specimen Description BLOOD BLOOD LEFT HAND  Final  ? Special Requests   Final  ?  BOTTLES DRAWN AEROBIC AND ANAEROBIC Blood Culture adequate volume  ? Culture   Final  ?  NO GROWTH 5 DAYS ?Performed at [facility], [address] [place], [place] [address] ?  ? Report Status 07/25/2021 FINAL  Final  ?Urine Culture     Status: None  ? Collection Time: 07/21/21 12:22 PM  ? Specimen: Urine, Catheterized  ?Result Value Ref Range Status  ? Specimen Description   Final  ?  URINE, CATHETERIZED ?Performed at [facility], [address], [place], [place] [address] ?  ? Special Requests   Final  ?  NONE ?Performed at [facility], [address], [place], [place] [address] ?  ? Culture   Final  ?  NO GROWTH ?Performed  at [facility], [place] [address], [place], [place] [address] ?  ? Report Status 07/22/2021 FINAL  Final  ? ? ?Labs: ?CBC: ?No results for input(s): WBC, NEUTROABS, HGB, HCT, MCV, PLT in the last 168 hours. ?Basic Metabolic Panel: ?No results for input(s): NA, K, CL, CO2, GLUCOSE, BUN, CREATININE, CALCIUM, MG, PHOS in the last 168 hours. ?Liver Function Tests: ?No results for input(s): AST, ALT, ALKPHOS, BILITOT, PROT, ALBUMIN in the last 168 hours. ?CBG: ?No results for input(s): GLUCAP in the last 168 hours. ? ?Discharge time spent: less than 30  minutes. ? ?Signed: ?Emeterio Reeve, DO ?[facility] ?08/30/2021 ?

## 2021-12-28 ENCOUNTER — Telehealth: Payer: Medicare Other

## 2022-11-29 IMAGING — DX DG CHEST 1V PORT
1 series · 1 of 1 positions shown · non-contrast
Comparison: Chest x-ray dated July 20, 2021

CLINICAL DATA: Worsening shortness breath.

EXAM:
PORTABLE CHEST 1 VIEW

[chest ap]
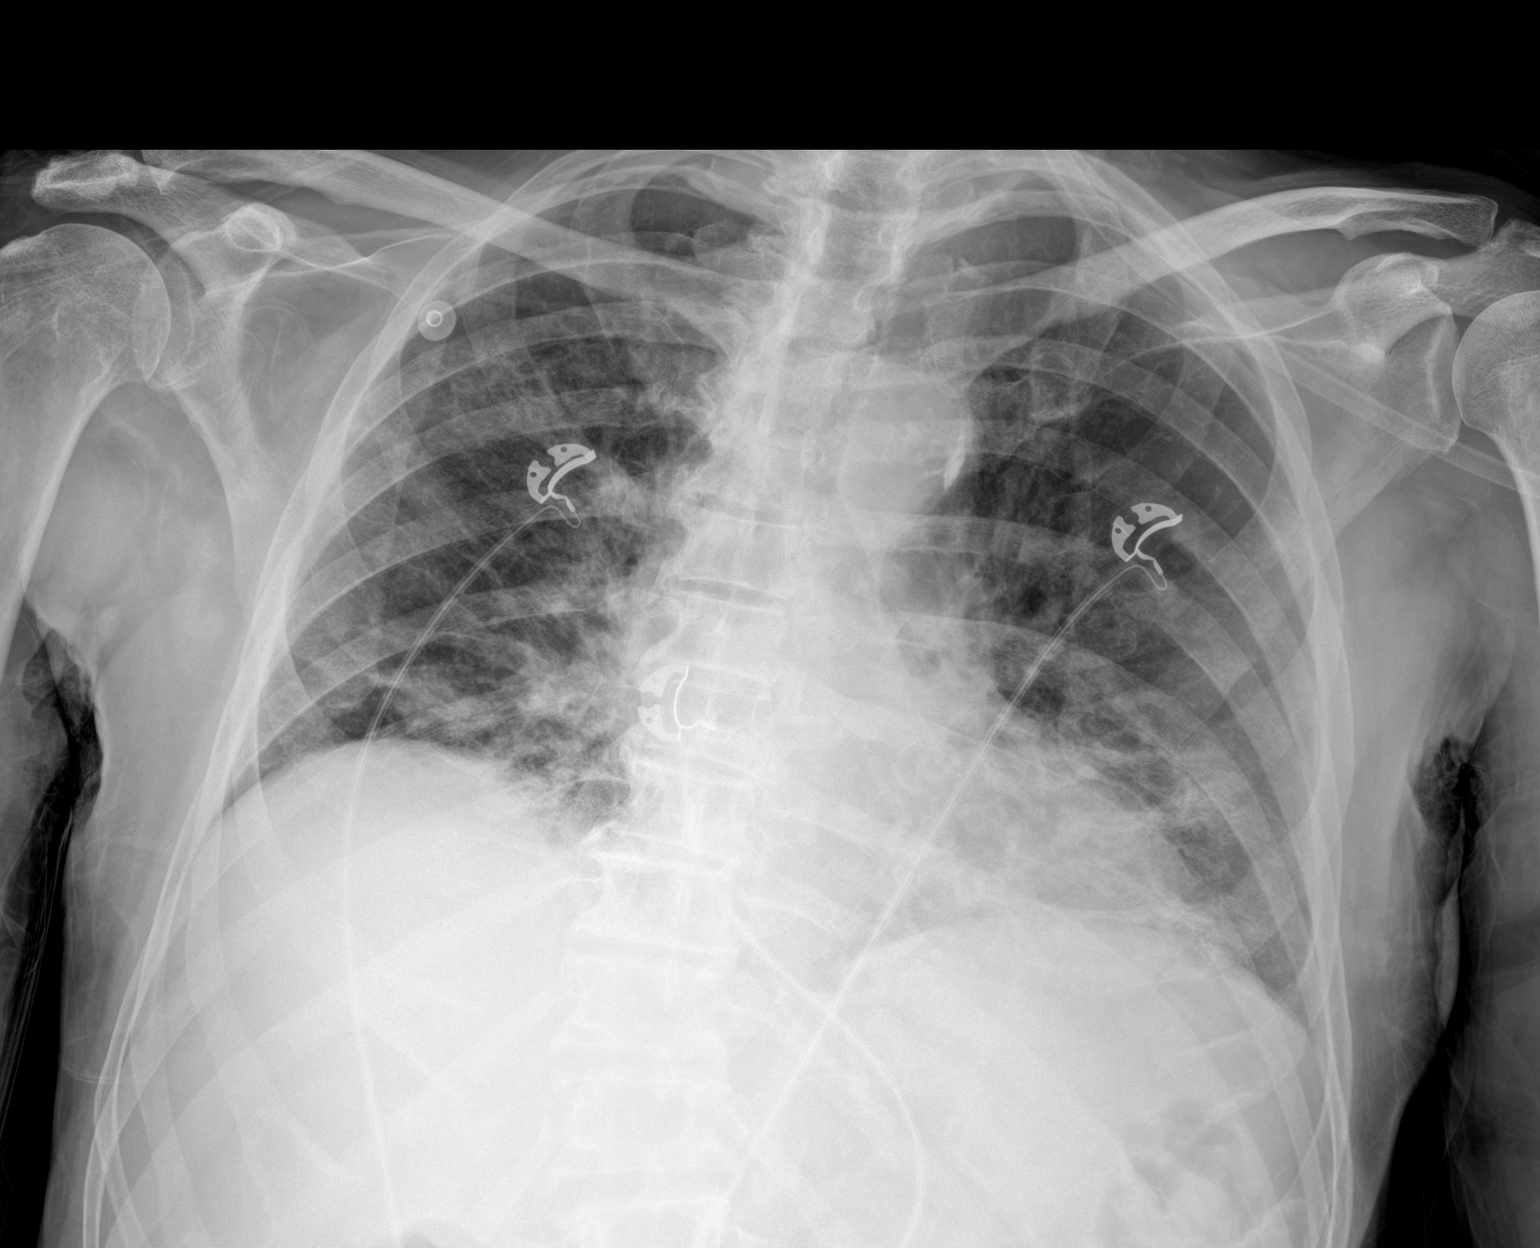

[1 of 1 positions shown; findings below may reference images not displayed]

FINDINGS: Cardiac and mediastinal contours are unchanged within normal limits.
New bilateral central heterogeneous opacities. No large pleural
effusion or pneumothorax.
IMPRESSION: New bilateral central heterogeneous opacities, possibly due to
pulmonary edema or infection.
